# Patient Record
Sex: Female | Born: 1990 | Race: Black or African American | Hispanic: No | Smoking: Former smoker
Health system: Southern US, Community
[De-identification: ages and names within clinical notes are randomized; demographics above are authoritative.]

## PROBLEM LIST (undated history)

## (undated) ENCOUNTER — Inpatient Hospital Stay (HOSPITAL_COMMUNITY): Payer: Self-pay

## (undated) DIAGNOSIS — B999 Unspecified infectious disease: Secondary | ICD-10-CM

## (undated) DIAGNOSIS — N200 Calculus of kidney: Secondary | ICD-10-CM

## (undated) DIAGNOSIS — Z86711 Personal history of pulmonary embolism: Secondary | ICD-10-CM

## (undated) DIAGNOSIS — D649 Anemia, unspecified: Secondary | ICD-10-CM

## (undated) DIAGNOSIS — N159 Renal tubulo-interstitial disease, unspecified: Secondary | ICD-10-CM

## (undated) DIAGNOSIS — E282 Polycystic ovarian syndrome: Secondary | ICD-10-CM

## (undated) DIAGNOSIS — D573 Sickle-cell trait: Secondary | ICD-10-CM

## (undated) DIAGNOSIS — Z8759 Personal history of other complications of pregnancy, childbirth and the puerperium: Secondary | ICD-10-CM

## (undated) DIAGNOSIS — F419 Anxiety disorder, unspecified: Secondary | ICD-10-CM

## (undated) DIAGNOSIS — Z8739 Personal history of other diseases of the musculoskeletal system and connective tissue: Secondary | ICD-10-CM

## (undated) DIAGNOSIS — F603 Borderline personality disorder: Secondary | ICD-10-CM

## (undated) DIAGNOSIS — K219 Gastro-esophageal reflux disease without esophagitis: Secondary | ICD-10-CM

## (undated) DIAGNOSIS — K429 Umbilical hernia without obstruction or gangrene: Secondary | ICD-10-CM

## (undated) DIAGNOSIS — R87629 Unspecified abnormal cytological findings in specimens from vagina: Secondary | ICD-10-CM

## (undated) DIAGNOSIS — IMO0002 Reserved for concepts with insufficient information to code with codable children: Secondary | ICD-10-CM

## (undated) DIAGNOSIS — O26839 Pregnancy related renal disease, unspecified trimester: Secondary | ICD-10-CM

## (undated) DIAGNOSIS — G43909 Migraine, unspecified, not intractable, without status migrainosus: Secondary | ICD-10-CM

## (undated) HISTORY — PX: WISDOM TOOTH EXTRACTION: SHX21

## (undated) HISTORY — DX: Borderline personality disorder: F60.3

## (undated) HISTORY — DX: Personal history of other diseases of the musculoskeletal system and connective tissue: Z87.39

## (undated) HISTORY — DX: Unspecified infectious disease: B99.9

## (undated) HISTORY — DX: Personal history of other complications of pregnancy, childbirth and the puerperium: Z87.59

## (undated) HISTORY — DX: Anemia, unspecified: D64.9

## (undated) HISTORY — DX: Migraine, unspecified, not intractable, without status migrainosus: G43.909

## (undated) HISTORY — DX: Anxiety disorder, unspecified: F41.9

## (undated) HISTORY — DX: Reserved for concepts with insufficient information to code with codable children: IMO0002

## (undated) HISTORY — DX: Umbilical hernia without obstruction or gangrene: K42.9

## (undated) HISTORY — DX: Renal tubulo-interstitial disease, unspecified: N15.9

## (undated) HISTORY — DX: Personal history of pulmonary embolism: Z86.711

---
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
## 2009-03-07 ENCOUNTER — Emergency Department (HOSPITAL_COMMUNITY): Admission: EM | Admit: 2009-03-07 | Discharge: 2009-03-07 | Payer: Self-pay | Admitting: Emergency Medicine

## 2010-06-13 LAB — POCT URINALYSIS DIP (DEVICE)
Ketones, ur: 40 mg/dL — AB
Nitrite: POSITIVE — AB
Protein, ur: >=300 mg/dL — AB

## 2010-06-13 LAB — POCT PREGNANCY, URINE: Preg Test, Ur: NEGATIVE

## 2011-09-25 ENCOUNTER — Encounter (HOSPITAL_COMMUNITY): Payer: Self-pay

## 2011-09-25 ENCOUNTER — Emergency Department (INDEPENDENT_AMBULATORY_CARE_PROVIDER_SITE_OTHER)
Admission: EM | Admit: 2011-09-25 | Discharge: 2011-09-25 | Disposition: A | Discharge status: Home / Self Care | Payer: BC Managed Care – PPO | Source: Home / Self Care | Attending: Emergency Medicine | Admitting: Emergency Medicine

## 2011-09-25 DIAGNOSIS — J039 Acute tonsillitis, unspecified: Secondary | ICD-10-CM

## 2011-09-25 LAB — POCT RAPID STREP A: Streptococcus, Group A Screen (Direct): NEGATIVE

## 2011-09-25 MED ORDER — PENICILLIN G BENZATHINE 1200000 UNIT/2ML IM SUSP
INTRAMUSCULAR | Status: AC
  Filled 2011-09-25: qty 2

## 2011-09-25 MED ORDER — LIDOCAINE VISCOUS 2 % MT SOLN: 15.0000 mL | OROMUCOSAL | Status: AC | PRN

## 2011-09-25 MED ORDER — PENICILLIN G BENZATHINE 1200000 UNIT/2ML IM SUSP
1.2000 10*6.[IU] | Freq: Once | INTRAMUSCULAR | Status: AC
  Administered 2011-09-25: 1.2 10*6.[IU] via INTRAMUSCULAR

## 2011-09-25 NOTE — ED Provider Notes (Signed)
History     CSN: [id number]  Arrival date & time 09/25/11  1110   First MD Initiated Contact with Patient 09/25/11 1214      Chief Complaint  Patient presents with  . Sore Throat  . Otalgia    (Consider location/radiation/quality/duration/timing/severity/associated sxs/prior treatment) HPI Comments: Pain is only on R side  Patient is a 21 y.o. female presenting with pharyngitis and ear pain. The history is provided by the patient.  Sore Throat This is a new problem. The current episode started 2 days ago. The problem occurs constantly. The problem has been gradually worsening. Pertinent negatives include no abdominal pain. The symptoms are aggravated by swallowing. Nothing relieves the symptoms. Treatments tried: chloraseptic. The treatment provided no relief.  Otalgia Associated symptoms include sore throat. Pertinent negatives include no rhinorrhea, no abdominal pain, no cough and no rash.    History reviewed. No pertinent past medical history.  History reviewed. No pertinent past surgical history.  History reviewed. No pertinent family history.  History  Substance Use Topics  . Smoking status: Current Everyday Smoker  . Smokeless tobacco: Not on file  . Alcohol Use: Yes    OB History    Grav Para Term Preterm Abortions TAB SAB Ect Mult Living                  Review of Systems  Constitutional: Positive for chills. Negative for fever.  HENT: Positive for ear pain and sore throat. Negative for congestion, rhinorrhea, postnasal drip and sinus pressure.   Respiratory: Negative for cough.   Gastrointestinal: Negative for abdominal pain.  Skin: Negative for rash.    Allergies  Review of patient's allergies indicates no known allergies.  Home Medications   Current Outpatient Rx  Name Route Sig Dispense Refill  . LIDOCAINE VISCOUS 2 % MT SOLN Oral Take 15 mLs by mouth every 3 (three) hours as needed for pain. 100 mL 0    BP 133/76  Pulse 102  Temp 99 F  (37.2 C) (Oral)  Resp 18  SpO2 100%  LMP 09/02/2011  Physical Exam  Constitutional: She appears well-developed and well-nourished.       uncomfortable appearing  HENT:  Right Ear: Tympanic membrane, external ear and ear canal normal.  Left Ear: Tympanic membrane, external ear and ear canal normal.  Mouth/Throat: No tonsillar abscesses.       L tonsil/pharynx normal, R tonsil enlarged, erythematous, exudate.    Cardiovascular: Normal rate and regular rhythm.   Pulmonary/Chest: Effort normal and breath sounds normal.  Lymphadenopathy:       Head (right side): Submandibular and tonsillar adenopathy present. No submental, no preauricular and no posterior auricular adenopathy present.       Head (left side): No submandibular, no tonsillar, no preauricular and no posterior auricular adenopathy present.    She has no cervical adenopathy.    ED Course  Procedures (including critical care time)   Labs Reviewed  POCT RAPID STREP A ([facility] ONLY)  POCT INFECTIOUS MONO SCREEN   No results found.   1. Tonsillitis with exudate       MDM  Discussed with Dr. Lorenz Coaster. Strep and mono negative.         Cathlyn Parsons, NP 09/25/11 1324

## 2011-09-25 NOTE — ED Notes (Signed)
C/o sore throat since Sunday and rt ear pain since Monday.  Has also had chills.

## 2011-09-25 NOTE — ED Provider Notes (Signed)
Medical screening examination/treatment/procedure(s) were performed by non-physician practitioner and as supervising physician I was immediately available for consultation/collaboration.  Kupono Marling, M.D.   Daxter Paule C Ellyanna Holton, MD 09/25/11 2050 

## 2011-09-28 ENCOUNTER — Encounter (HOSPITAL_COMMUNITY): Payer: Self-pay

## 2011-09-28 ENCOUNTER — Emergency Department (INDEPENDENT_AMBULATORY_CARE_PROVIDER_SITE_OTHER)
Admission: EM | Admit: 2011-09-28 | Discharge: 2011-09-28 | Disposition: A | Discharge status: Home / Self Care | Payer: BC Managed Care – PPO | Source: Home / Self Care | Attending: Emergency Medicine | Admitting: Emergency Medicine

## 2011-09-28 ENCOUNTER — Telehealth (HOSPITAL_COMMUNITY): Payer: Self-pay | Admitting: *Deleted

## 2011-09-28 DIAGNOSIS — J020 Streptococcal pharyngitis: Secondary | ICD-10-CM

## 2011-09-28 MED ORDER — CLINDAMYCIN HCL 300 MG PO CAPS: 300.0000 mg | ORAL_CAPSULE | Freq: Four times a day (QID) | ORAL | Status: AC

## 2011-09-28 MED ORDER — METHYLPREDNISOLONE ACETATE 80 MG/ML IJ SUSP
INTRAMUSCULAR | Status: AC
  Filled 2011-09-28: qty 1

## 2011-09-28 MED ORDER — HYDROCODONE-ACETAMINOPHEN 5-325 MG PO TABS
1.0000 | ORAL_TABLET | Freq: Once | ORAL | Status: AC
  Administered 2011-09-28: 1 via ORAL

## 2011-09-28 MED ORDER — HYDROCODONE-ACETAMINOPHEN 5-325 MG PO TABS
ORAL_TABLET | ORAL | Status: AC
  Filled 2011-09-28: qty 1

## 2011-09-28 MED ORDER — SODIUM CHLORIDE 0.9 % IV SOLN
INTRAVENOUS | Status: DC
  Administered 2011-09-28 (×2): via INTRAVENOUS

## 2011-09-28 MED ORDER — METHYLPREDNISOLONE ACETATE 80 MG/ML IJ SUSP
80.0000 mg | Freq: Once | INTRAMUSCULAR | Status: AC
  Administered 2011-09-28: 80 mg via INTRAMUSCULAR

## 2011-09-28 MED ORDER — HYDROCODONE-ACETAMINOPHEN 5-325 MG PO TABS: ORAL_TABLET | ORAL | Status: AC

## 2011-09-28 NOTE — ED Notes (Signed)
Pt. called and said she was told she would be better by today.  States she is getting worse.  States she got a shot of PCN for tonsillitis and liquid Ibuprofen ( got viscous lidocaine).  States it numbs her for 15 min.  Can't drink.  C/o fever, prod. cough yellow sputum, spitting her saliva, and hurts worse 10/10.  Discussed with Rica Mast NP and she said pt. has to come back to be rechecked.  Pt. told this.  She said OK. Vassie Moselle 09/28/2011

## 2011-09-28 NOTE — ED Notes (Signed)
C/o feels no better, cannot swallow w/o pain; hurts less to spit than to swallow; throat reddened, foul smelling breath, tongue red, white patches, dry

## 2011-09-28 NOTE — ED Notes (Signed)
States she feels better, does not hurt as badly to swallow; was sleeping on initial entrance to room to eval

## 2011-09-28 NOTE — ED Provider Notes (Signed)
Chief Complaint  Patient presents with  . Sore Throat    History of Present Illness:   The patient is a 21 year old female who has had a six-day history of a severe right-sided sore throat. It hurts to swallow and she has to spit out her saliva. She's felt feverish and chilled, had a headache, slight cough, nausea. She was seen here 3 days ago. At that time a rapid strep was negative. I saw her along with the nurse practitioner. At that time her throat showed moderate swelling and purulent patches on the right tonsil. She was given LA Bicillin 1.2 million units, but today returns because she does not feel any better at all.  Review of Systems:  Other than as noted above, the patient denies any of the following symptoms. Systemic:  No fever, chills, sweats, fatigue, myalgias, headache, or anorexia. Eye:  No redness, pain or drainage. ENT:  No earache, ear congestion, nasal congestion, sneezing, rhinorrhea, sinus pressure, sinus pain, or post nasal drip. Lungs:  No cough, sputum production, wheezing, shortness of breath, or chest pain. GI:  No abdominal pain, nausea, vomiting, or diarrhea. Skin:  No rash or itching.  PMFSH:  Past medical history, family history, social history, meds, allergies, and nurse's notes were reviewed.  There is no known exposure to strep or mono.  No prior history of step or mono.  The patient denies use of tobacco.  Physical Exam:   Vital signs:  BP 83/49  Pulse 68  Temp 100.9 F (38.3 C) (Oral)  Resp 18  SpO2 100%  LMP 09/02/2011 General:  Alert, in moderate distress due to sore throat. She is spitting out her saliva. Eye:  No conjunctival injection or drainage. Lids were normal. ENT:  TMs and canals were normal, without erythema or inflammation.  Nasal mucosa was clear and uncongested, without drainage.  Mucous membranes were moist.  Exam of pharynx shows erythema and swelling of the right tonsil with exudate present. This looks identical to the appearance 3 days  ago when I saw her along with the nurse practitioner.  There were no oral ulcerations or lesions. Neck:  Supple, no adenopathy, tenderness or mass. Lungs:  No respiratory distress.  Lungs were clear to auscultation, without wheezes, rales or rhonchi.  Breath sounds were clear and equal bilaterally.  Heart:  Regular rhythm, without gallops, murmers or rubs. Skin:  Clear, warm, and dry, without rash or lesions.  Labs:   Results for orders placed during the hospital encounter of 09/28/11  POCT RAPID STREP A ([facility] ONLY)      Component Value Range   Streptococcus, Group A Screen (Direct) POSITIVE (*) NEGATIVE   Course in Urgent Care Center:   She was given 1 L of normal saline IV since she was somewhat hypotensive and has not been taking much in the way of by mouth intake. She also was given Depo-Medrol 80 mg IM for the inflammation and for pain and hydrocodone/APAP 5/325 one by mouth. She tolerated these all well and noted improvement in her symptoms after these treatments.  Assessment:  The encounter diagnosis was Strep throat. This is an unusual case. 2 days ago she had a negative rapid strep and was given LA Bicillin and is no better today. Today she has a positive rapid strep and her throat looks about the same. It looks like the LA Bicillin did not work, so he went to go to a second line drug, clindamycin and told her to return in  48 hours if no improvement.  Plan:   1.  The following meds were prescribed:   New Prescriptions   CLINDAMYCIN (CLEOCIN) 300 MG CAPSULE    Take 1 capsule (300 mg total) by mouth 4 (four) times daily.   HYDROCODONE-ACETAMINOPHEN (NORCO/VICODIN) 5-325 MG PER TABLET    1 to 2 tabs every 4 to 6 hours as needed for pain.   2.  The patient was instructed in symptomatic care including hot saline gargles, throat lozenges, infectious precautions, and need to trade out toothbrush. Handouts were given. 3.  The patient was told to return if becoming worse in any way, if  no better in 3 or 4 days, and given some red flag symptoms that would indicate earlier return.    Reuben Likes, MD 09/28/11 2123

## 2011-11-22 ENCOUNTER — Ambulatory Visit (INDEPENDENT_AMBULATORY_CARE_PROVIDER_SITE_OTHER): Payer: BC Managed Care – PPO | Admitting: Obstetrics and Gynecology

## 2011-11-22 DIAGNOSIS — Z331 Pregnant state, incidental: Secondary | ICD-10-CM

## 2011-11-22 LAB — POCT URINALYSIS DIPSTICK
Bilirubin, UA: NEGATIVE
Blood, UA: NEGATIVE
Glucose, UA: NEGATIVE
Ketones, UA: NEGATIVE
Protein, UA: NEGATIVE
Spec Grav, UA: 1.015
pH, UA: 6

## 2011-11-23 LAB — PRENATAL PANEL VII
Basophils Absolute: 0 10*3/uL (ref 0.0–0.1)
Basophils Relative: 0 % (ref 0–1)
HCT: 37 % (ref 36.0–46.0)
HIV: NONREACTIVE
Lymphocytes Relative: 20 % (ref 12–46)
Lymphs Abs: 1.5 10*3/uL (ref 0.7–4.0)
MCH: 28.3 pg (ref 26.0–34.0)
MCV: 82.6 fL (ref 78.0–100.0)
Monocytes Absolute: 0.6 10*3/uL (ref 0.1–1.0)
Monocytes Relative: 9 % (ref 3–12)
Neutrophils Relative %: 69 % (ref 43–77)
Rh Type: POSITIVE
Rubella: 159.6 IU/mL — ABNORMAL HIGH
WBC: 7.3 10*3/uL (ref 4.0–10.5)

## 2011-11-25 LAB — CULTURE, OB URINE

## 2011-11-26 LAB — HEMOGLOBINOPATHY EVALUATION
Hgb A: 56.1 % — ABNORMAL LOW (ref 96.8–97.8)
Hgb F Quant: 0 % (ref 0.0–2.0)
Hgb S Quant: 40.8 % — ABNORMAL HIGH

## 2011-11-29 ENCOUNTER — Ambulatory Visit (INDEPENDENT_AMBULATORY_CARE_PROVIDER_SITE_OTHER): Payer: BC Managed Care – PPO

## 2011-11-29 ENCOUNTER — Ambulatory Visit (INDEPENDENT_AMBULATORY_CARE_PROVIDER_SITE_OTHER): Payer: BC Managed Care – PPO | Admitting: Obstetrics and Gynecology

## 2011-11-29 ENCOUNTER — Encounter: Payer: Self-pay | Admitting: Obstetrics and Gynecology

## 2011-11-29 VITALS — BP 90/62 | Ht 67.5 in | Wt 124.0 lb

## 2011-11-29 DIAGNOSIS — K429 Umbilical hernia without obstruction or gangrene: Secondary | ICD-10-CM

## 2011-11-29 DIAGNOSIS — Z369 Encounter for antenatal screening, unspecified: Secondary | ICD-10-CM

## 2011-11-29 DIAGNOSIS — Z87440 Personal history of urinary (tract) infections: Secondary | ICD-10-CM

## 2011-11-29 DIAGNOSIS — R6889 Other general symptoms and signs: Secondary | ICD-10-CM

## 2011-11-29 DIAGNOSIS — Z331 Pregnant state, incidental: Secondary | ICD-10-CM

## 2011-11-29 DIAGNOSIS — IMO0002 Reserved for concepts with insufficient information to code with codable children: Secondary | ICD-10-CM

## 2011-11-29 DIAGNOSIS — Z36 Encounter for antenatal screening of mother: Secondary | ICD-10-CM

## 2011-11-29 DIAGNOSIS — Z124 Encounter for screening for malignant neoplasm of cervix: Secondary | ICD-10-CM

## 2011-11-29 DIAGNOSIS — Z87448 Personal history of other diseases of urinary system: Secondary | ICD-10-CM

## 2011-11-29 NOTE — Progress Notes (Signed)
Patient ID: Tasha Wu, female   DOB: September 25, 1990, 21 y.o.   MRN: [id number] Tasha Wu is a 21 y.o. female presenting for new ob visit. No using contraception at time of conception, certain of LMP will use for dating, [place] !st screen agrees with EDC. @MED  @IPILAPH @ OB History    Grav Para Term Preterm Abortions TAB SAB Ect Mult Living   2    1         HX EAB x 1 Past Medical History  Diagnosis Date  . Infection     Yeast;would get frequently  . Infection     BV x 1  . Kidney infection 2010    PO antibxs  . Migraines     no rx'd meds in the past;goes to sleep  . Umbilical hernia 1991    Does not cause anyp problems  . Emotional instability     Break down easily when things are not going her way  . Anxiety 2010    anxiety attack;no meds;has not been dx'd  . Hx of joint problems     Shoulders and knees have pain   Past Surgical History  Procedure Date  . Dilation and curettage of uterus 2009    No complications   Family History: family history includes Anemia in her mother; Arthritis in her paternal grandmother; Asthma in her sister; Diabetes in her maternal aunt and paternal grandfather; Diabetes type I in her cousin; Heart disease in her maternal aunt; Hernia in her maternal aunt; Hypertension in her maternal uncle and mother; Hyperthyroidism in her paternal grandmother; Migraines in her father; Other in her father; Scoliosis in her paternal grandmother; Sickle cell anemia in her maternal aunt and maternal grandmother; and Sickle cell trait in her brother and mother. Social History:  reports that she has been smoking Cigarettes.  She has been smoking about .2 packs per day. She does not have any smokeless tobacco history on file. She reports that she drinks about 3.5 ounces of alcohol per week. She reports that she does not use illicit drugs.  @ROS @    Blood pressure 90/62, height 5' 7.5" (1.715 m), weight 124 lb (56.246 kg), last menstrual period 09/02/2011. Physical  exam: Calm, no distress, HEENT wnl lungs clear bilaterally, breasts bilaterally no masses, lumps, dimpling or drainage, AP RRR, abd soft, gravid, nt, bowel sounds active, abdomen nontender, normal hair distrubition mons pubis,  EGBUS WNL, sterile speculum exam,  vagina pink, moist normal rugae,  cerix LTC, no cervical motion tenderness, No adnexal masses or tenderness Scant white discharge uterus firm 12weeks size DTR 2 no clonus No edema to lower extremities  Prenatal labs: ABO, Rh: O/POS/-- (09/19 1120) Antibody: NEG (09/19 1120) Rubella:  immune RPR: NON REAC (09/19 1120)  HBsAg: NEGATIVE (09/19 1120)  HIV: NON REACTIVE (09/19 1120)  GBS:   NA  Assessment/Plan: [redacted]w[redacted]d GC/CHL sent WET PREP +clue neg trich, neg hyphae with BV PAP sent ULTRASOUND 1st trimester screen  Dates agree Genetic testing f/o AFP next visit. Collaboration with Dr. Stefano Gaul. Tasha Wu 11/29/2011, 1:54 PM Lavera Guise, CNM

## 2011-11-29 NOTE — Progress Notes (Signed)
[redacted]w[redacted]d Pt wants genetic screening  Pap due today per pt  1st trimester scheduled today

## 2011-12-03 LAB — PAP IG, CT-NG, RFX HPV ASCU: Chlamydia Probe Amp: NEGATIVE

## 2011-12-03 LAB — US OB COMP LESS 14 WKS

## 2011-12-04 ENCOUNTER — Other Ambulatory Visit: Payer: Self-pay | Admitting: Obstetrics and Gynecology

## 2011-12-04 ENCOUNTER — Telehealth: Payer: Self-pay | Admitting: Obstetrics and Gynecology

## 2011-12-04 LAB — HUMAN PAPILLOMAVIRUS, HIGH RISK: HPV DNA High Risk: NOT DETECTED

## 2011-12-04 NOTE — Telephone Encounter (Signed)
TC to pt.  States Rx not at pharmacy.  Per [facility] called Rx for Flagyl, PNV, and Terazol as ordered in chart called to Dois Davenport at [facility]

## 2011-12-04 NOTE — Telephone Encounter (Signed)
VM from pt. Was prescribed  3 RX at last visit.  No available at pharmacy [facility]

## 2011-12-10 ENCOUNTER — Encounter: Payer: Self-pay | Admitting: Obstetrics and Gynecology

## 2011-12-10 DIAGNOSIS — IMO0002 Reserved for concepts with insufficient information to code with codable children: Secondary | ICD-10-CM

## 2011-12-10 DIAGNOSIS — R87619 Unspecified abnormal cytological findings in specimens from cervix uteri: Secondary | ICD-10-CM | POA: Insufficient documentation

## 2011-12-10 HISTORY — DX: Reserved for concepts with insufficient information to code with codable children: IMO0002

## 2011-12-10 HISTORY — DX: Unspecified abnormal cytological findings in specimens from cervix uteri: R87.619

## 2011-12-27 ENCOUNTER — Ambulatory Visit (INDEPENDENT_AMBULATORY_CARE_PROVIDER_SITE_OTHER): Payer: BC Managed Care – PPO | Admitting: Obstetrics and Gynecology

## 2011-12-27 VITALS — BP 90/60 | Wt 123.0 lb

## 2011-12-27 DIAGNOSIS — Z3490 Encounter for supervision of normal pregnancy, unspecified, unspecified trimester: Secondary | ICD-10-CM

## 2011-12-27 DIAGNOSIS — O480 Post-term pregnancy: Secondary | ICD-10-CM

## 2011-12-27 DIAGNOSIS — Z1379 Encounter for other screening for genetic and chromosomal anomalies: Secondary | ICD-10-CM

## 2011-12-27 DIAGNOSIS — Z3689 Encounter for other specified antenatal screening: Secondary | ICD-10-CM

## 2011-12-27 DIAGNOSIS — Z3201 Encounter for pregnancy test, result positive: Secondary | ICD-10-CM

## 2011-12-27 NOTE — Progress Notes (Signed)
[redacted]w[redacted]d No c/o  AFP today Anatomy US [place] F/u 4wks

## 2011-12-27 NOTE — Progress Notes (Signed)
[redacted]w[redacted]d No complaints today Pt declines Flu Vaccine

## 2011-12-27 NOTE — Patient Instructions (Signed)
High Protein Diet  A high protein diet means that high protein foods are added to your diet. Getting more protein in the diet is important for a number of reasons. Protein helps the body to build tissue, muscle, and to repair damage. People who have had surgery, injuries such as broken bones, infections, and burns, or illnesses such as cancer, may need more protein in their diet.   SERVING SIZES  Measuring foods and serving sizes helps to make sure you are getting the right amount of food. The list below tells how big or small some common serving sizes are.    1 oz.........4 stacked dice.   3 oz.........Deck of cards.   1 tsp........Tip of little finger.   1 tbs........Thumb.   2 tbs........Golf ball.    cup.......Half of a fist.   1 cup........A fist.  FOOD SOURCES OF PROTEIN  Listed below are some food sources of protein and the amount of protein they contain. Your Registered Dietitian can calculate how many grams of protein you need for your medical condition. High protein foods can be added to the diet at mealtime or as snacks. Be sure to have at least 1 protein-containing food at each meal and snack to ensure adequate intake.   Meats and Meat Substitutes / Protein (g)   3 oz poultry (chicken, turkey) / 26 g   3 oz tuna, canned in water / 26 g   3 oz fish (cod) / 21 g   3 oz red meat (beef, pork) / 21 g   4 oz tofu / 9 g   1 egg / 6 g    cup egg substitute / 5 g   1 cup dried beans / 15 g   1 cup soy milk / 4 g  Dairy / Protein (g)   1 cup milk (skim, 1%, 2%, whole) / 8 g    cup evaporated milk / 9 g   1 cup buttermilk / 8 g   1 cup low-fat plain yogurt / 11 g   1 cup regular plain yogurt / 9 g    cup cottage cheese / 14 g   1 oz cheddar cheese / 7 g  Nuts / Protein (g)   2 tbs peanut butter / 8 g   1 oz peanuts / 7 g   2 tbs cashews / 5 g   2 tbs almonds / 5 g  Document Released: 02/19/2005 Document Revised: 05/14/2011 Document Reviewed: 11/22/2006  [employer] Patient Information  2013 [employer]

## 2011-12-29 LAB — URINE CULTURE: Colony Count: 35000

## 2012-01-01 LAB — ALPHA FETOPROTEIN, MATERNAL
AFP: 74.2 IU/mL
Open Spina bifida: NEGATIVE

## 2012-01-24 ENCOUNTER — Ambulatory Visit (INDEPENDENT_AMBULATORY_CARE_PROVIDER_SITE_OTHER): Payer: BC Managed Care – PPO

## 2012-01-24 ENCOUNTER — Ambulatory Visit (INDEPENDENT_AMBULATORY_CARE_PROVIDER_SITE_OTHER): Payer: BC Managed Care – PPO | Admitting: Obstetrics and Gynecology

## 2012-01-24 VITALS — BP 84/42 | Wt 129.0 lb

## 2012-01-24 DIAGNOSIS — Z331 Pregnant state, incidental: Secondary | ICD-10-CM

## 2012-01-24 DIAGNOSIS — Z3689 Encounter for other specified antenatal screening: Secondary | ICD-10-CM

## 2012-01-24 NOTE — Progress Notes (Signed)
[redacted]w[redacted]d no complaints

## 2012-01-24 NOTE — Progress Notes (Signed)
[redacted]w[redacted]d F/u anatomy scan today. No c/o.

## 2012-01-24 NOTE — Progress Notes (Signed)
Ultrasound shows:  SIUP  S=D     [place] EDD: 06/10/12            EFW: 12 oz            AFI: na           Cervical length: 3.21 cm           Placenta localization: anterior           Fetal presentation: vertex                   Anatomy survey is normal           Gender : female Comments: no previa. Placenta edge to cx = 8 cm - normal placental cord insertion. Normal fluid:AP pocket = 4.8 cm. Measurements concordant with established GA/EDD. No fetal abnormality is seen. Open hands, 5 th digit seen. female gender. CX closed. Measured transvaginally. Normal adnexa

## 2012-01-28 LAB — US OB COMP + 14 WK

## 2012-02-21 ENCOUNTER — Ambulatory Visit (INDEPENDENT_AMBULATORY_CARE_PROVIDER_SITE_OTHER): Payer: BC Managed Care – PPO | Admitting: Obstetrics and Gynecology

## 2012-02-21 ENCOUNTER — Encounter: Payer: Self-pay | Admitting: Obstetrics and Gynecology

## 2012-02-21 VITALS — BP 100/62 | Wt 134.0 lb

## 2012-02-21 DIAGNOSIS — Z331 Pregnant state, incidental: Secondary | ICD-10-CM

## 2012-02-21 NOTE — Progress Notes (Signed)
[redacted]w[redacted]d Pt without c/o lucola @[place] 

## 2012-02-21 NOTE — Progress Notes (Signed)
Pt stated no issues today. Pt declined the flu shot today.  

## 2012-02-21 NOTE — Patient Instructions (Signed)
ABCs of Pregnancy  A  Antepartum care is very important. Be sure you see your doctor and get prenatal care as soon as you think you are pregnant. At this time, you will be tested for infection, genetic abnormalities and potential problems with you and the pregnancy. This is the time to discuss diet, exercise, work, medications, labor, pain medication during labor and the possibility of a cesarean delivery. Ask any questions that may concern you. It is important to see your doctor regularly throughout your pregnancy. Avoid exposure to toxic substances and chemicals - such as cleaning solvents, lead and mercury, some insecticides, and paint. Pregnant women should avoid exposure to paint fumes, and fumes that cause you to feel ill, dizzy or faint. When possible, it is a good idea to have a pre-pregnancy consultation with your caregiver to begin some important recommendations your caregiver suggests such as, taking folic acid, exercising, quitting smoking, avoiding alcoholic beverages, etc.  B  Breastfeeding is the healthiest choice for both you and your baby. It has many nutritional benefits for the baby and health benefits for the mother. It also creates a very tight and loving bond between the baby and mother. Talk to your doctor, your family and friends, and your employer about how you choose to feed your baby and how they can support you in your decision. Not all birth defects can be prevented, but a woman can take actions that may increase her chance of having a healthy baby. Many birth defects happen very early in pregnancy, sometimes before a woman even knows she is pregnant. Birth defects or abnormalities of any child in your or the father's family should be discussed with your caregiver. Get a good support bra as your breast size changes. Wear it especially when you exercise and when nursing.   C  Celebrate the news of your pregnancy with the your spouse/father and family. Childbirth classes are helpful to  take for you and the spouse/father because it helps to understand what happens during the pregnancy, labor and delivery. Cesarean delivery should be discussed with your doctor so you are prepared for that possibility. The pros and cons of circumcision if it is a boy, should be discussed with your pediatrician. Cigarette smoking during pregnancy can result in low birth weight babies. It has been associated with infertility, miscarriages, tubal pregnancies, infant death (mortality) and poor health (morbidity) in childhood. Additionally, cigarette smoking may cause long-term learning disabilities. If you smoke, you should try to quit before getting pregnant and not smoke during the pregnancy. Secondary smoke may also harm a mother and her developing baby. It is a good idea to ask people to stop smoking around you during your pregnancy and after the baby is born. Extra calcium is necessary when you are pregnant and is found in your prenatal vitamin, in dairy products, green leafy vegetables and in calcium supplements.  D  A healthy diet according to your current weight and height, along with vitamins and mineral supplements should be discussed with your caregiver. Domestic abuse or violence should be made known to your doctor right away to get the situation corrected. Drink more water when you exercise to keep hydrated. Discomfort of your back and legs usually develops and progresses from the middle of the second trimester through to delivery of the baby. This is because of the enlarging baby and uterus, which may also affect your balance. Do not take illegal drugs. Illegal drugs can seriously harm the baby and you. Drink extra   fluids (water is best) throughout pregnancy to help your body keep up with the increases in your blood volume. Drink at least 6 to 8 glasses of water, fruit juice, or milk each day. A good way to know you are drinking enough fluid is when your urine looks almost like clear water or is very light  yellow.   E  Eat healthy to get the nutrients you and your unborn baby need. Your meals should include the five basic food groups. Exercise (30 minutes of light to moderate exercise a day) is important and encouraged during pregnancy, if there are no medical problems or problems with the pregnancy. Exercise that causes discomfort or dizziness should be stopped and reported to your caregiver. Emotions during pregnancy can change from being ecstatic to depression and should be understood by you, your partner and your family.  F  Fetal screening with ultrasound, amniocentesis and monitoring during pregnancy and labor is common and sometimes necessary. Take 400 micrograms of folic acid daily both before, when possible, and during the first few months of pregnancy to reduce the risk of birth defects of the brain and spine. All women who could possibly become pregnant should take a vitamin with folic acid, every day. It is also important to eat a healthy diet with fortified foods (enriched grain products, including cereals, rice, breads, and pastas) and foods with natural sources of folate (orange juice, green leafy vegetables, beans, peanuts, broccoli, asparagus, peas, and lentils). The father should be involved with all aspects of the pregnancy including, the prenatal care, childbirth classes, labor, delivery, and postpartum time. Fathers may also have emotional concerns about being a father, financial needs, and raising a family.  G  Genetic testing should be done appropriately. It is important to know your family and the father's history. If there have been problems with pregnancies or birth defects in your family, report these to your doctor. Also, genetic counselors can talk with you about the information you might need in making decisions about having a family. You can call a major medical center in your area for help in finding a board-certified genetic counselor. Genetic testing and counseling should be done  before pregnancy when possible, especially if there is a history of problems in the mother's or father's family. Certain ethnic backgrounds are more at risk for genetic defects.  H  Get familiar with the hospital where you will be having your baby. Get to know how long it takes to get there, the labor and delivery area, and the hospital procedures. Be sure your medical insurance is accepted there. Get your home ready for the baby including, clothes, the baby's room (when possible), furniture and car seat. Hand washing is important throughout the day, especially after handling raw meat and poultry, changing the baby's diaper or using the bathroom. This can help prevent the spread of many bacteria and viruses that cause infection. Your hair may become dry and thinner, but will return to normal a few weeks after the baby is born. Heartburn is a common problem that can be treated by taking antacids recommended by your caregiver, eating smaller meals 5 or 6 times a day, not drinking liquids when eating, drinking between meals and raising the head of your bed 2 to 3 inches.  I  Insurance to cover you, the baby, doctor and hospital should be reviewed so that you will be prepared to pay any costs not covered by your insurance plan. If you do not have medical insurance,   there are usually clinics and services available for you in your community. Take 30 milligrams of iron during your pregnancy as prescribed by your doctor to reduce the risk of low red blood cells (anemia) later in pregnancy. All women of childbearing age should eat a diet rich in iron.  J  There should be a joint effort for the mother, father and any other children to adapt to the pregnancy financially, emotionally, and psychologically during the pregnancy. Join a support group for moms-to-be. Or, join a class on parenting or childbirth. Have the family participate when possible.  K  Know your limits. Let your caregiver know if you experience any of the  following:   · Pain of any kind.  · Strong cramps.  · You develop a lot of weight in a short period of time (5 pounds in 3 to 5 days).  · Vaginal bleeding, leaking of amniotic fluid.  · Headache, vision problems.  · Dizziness, fainting, shortness of breath.  · Chest pain.  · Fever of 102° F (38.9° C) or higher.  · Gush of clear fluid from your vagina.  · Painful urination.  · Domestic violence.  · Irregular heartbeat (palpitations).  · Rapid beating of the heart (tachycardia).  · Constant feeling sick to your stomach (nauseous) and vomiting.  · Trouble walking, fluid retention (edema).  · Muscle weakness.  · If your baby has decreased activity.  · Persistent diarrhea.  · Abnormal vaginal discharge.  · Uterine contractions at 20-minute intervals.  · Back pain that travels down your leg.  L  Learn and practice that what you eat and drink should be in moderation and healthy for you and your baby. Legal drugs such as alcohol and caffeine are important issues for pregnant women. There is no safe amount of alcohol a woman can drink while pregnant. Fetal alcohol syndrome, a disorder characterized by growth retardation, facial abnormalities, and central nervous system dysfunction, is caused by a woman's use of alcohol during pregnancy. Caffeine, found in tea, coffee, soft drinks and chocolate, should also be limited. Be sure to read labels when trying to cut down on caffeine during pregnancy. More than 200 foods, beverages, and over-the-counter medications contain caffeine and have a high salt content! There are coffees and teas that do not contain caffeine.  M  Medical conditions such as diabetes, epilepsy, and high blood pressure should be treated and kept under control before pregnancy when possible, but especially during pregnancy. Ask your caregiver about any medications that may need to be changed or adjusted during pregnancy. If you are currently taking any medications, ask your caregiver if it is safe to take them  while you are pregnant or before getting pregnant when possible. Also, be sure to discuss any herbs or vitamins you are taking. They are medicines, too! Discuss with your doctor all medications, prescribed and over-the-counter, that you are taking. During your prenatal visit, discuss the medications your doctor may give you during labor and delivery.  N  Never be afraid to ask your doctor or caregiver questions about your health, the progress of the pregnancy, family problems, stressful situations, and recommendation for a pediatrician, if you do not have one. It is better to take all precautions and discuss any questions or concerns you may have during your office visits. It is a good idea to write down your questions before you visit the doctor.  O  Over-the-counter cough and cold remedies may contain alcohol or other ingredients that should   be avoided during pregnancy. Ask your caregiver about prescription, herbs or over-the-counter medications that you are taking or may consider taking while pregnant.   P  Physical activity during pregnancy can benefit both you and your baby by lessening discomfort and fatigue, providing a sense of well-being, and increasing the likelihood of early recovery after delivery. Light to moderate exercise during pregnancy strengthens the belly (abdominal) and back muscles. This helps improve posture. Practicing yoga, walking, swimming, and cycling on a stationary bicycle are usually safe exercises for pregnant women. Avoid scuba diving, exercise at high altitudes (over 3000 feet), skiing, horseback riding, contact sports, etc. Always check with your doctor before beginning any kind of exercise, especially during pregnancy and especially if you did not exercise before getting pregnant.  Q  Queasiness, stomach upset and morning sickness are common during pregnancy. Eating a couple of crackers or dry toast before getting out of bed. Foods that you normally love may make you feel sick to  your stomach. You may need to substitute other nutritious foods. Eating 5 or 6 small meals a day instead of 3 large ones may make you feel better. Do not drink with your meals, drink between meals. Questions that you have should be written down and asked during your prenatal visits.  R  Read about and make plans to baby-proof your home. There are important tips for making your home a safer environment for your baby. Review the tips and make your home safer for you and your baby. Read food labels regarding calories, salt and fat content in the food.  S  Saunas, hot tubs, and steam rooms should be avoided while you are pregnant. Excessive high heat may be harmful during your pregnancy. Your caregiver will screen and examine you for sexually transmitted diseases and genetic disorders during your prenatal visits. Learn the signs of labor. Sexual relations while pregnant is safe unless there is a medical or pregnancy problem and your caregiver advises against it.  T  Traveling long distances should be avoided especially in the third trimester of your pregnancy. If you do have to travel out of state, be sure to take a copy of your medical records and medical insurance plan with you. You should not travel long distances without seeing your doctor first. Most airlines will not allow you to travel after 36 weeks of pregnancy. Toxoplasmosis is an infection caused by a parasite that can seriously harm an unborn baby. Avoid eating undercooked meat and handling cat litter. Be sure to wear gloves when gardening. Tingling of the hands and fingers is not unusual and is due to fluid retention. This will go away after the baby is born.  U  Womb (uterus) size increases during the first trimester. Your kidneys will begin to function more efficiently. This may cause you to feel the need to urinate more often. You may also leak urine when sneezing, coughing or laughing. This is due to the growing uterus pressing against your bladder,  which lies directly in front of and slightly under the uterus during the first few months of pregnancy. If you experience burning along with frequency of urination or bloody urine, be sure to tell your doctor. The size of your uterus in the third trimester may cause a problem with your balance. It is advisable to maintain good posture and avoid wearing high heels during this time. An ultrasound of your baby may be necessary during your pregnancy and is safe for you and your baby.  V    Vaccinations are an important concern for pregnant women. Get needed vaccines before pregnancy. Center for Disease Control (www.cdc.gov) has clear guidelines for the use of vaccines during pregnancy. Review the list, be sure to discuss it with your doctor. Prenatal vitamins are helpful and healthy for you and the baby. Do not take extra vitamins except what is recommended. Taking too much of certain vitamins can cause overdose problems. Continuous vomiting should be reported to your caregiver. Varicose veins may appear especially if there is a family history of varicose veins. They should subside after the delivery of the baby. Support hose helps if there is leg discomfort.  W  Being overweight or underweight during pregnancy may cause problems. Try to get within 15 pounds of your ideal weight before pregnancy. Remember, pregnancy is not a time to be dieting! Do not stop eating or start skipping meals as your weight increases. Both you and your baby need the calories and nutrition you receive from a healthy diet. Be sure to consult with your doctor about your diet. There is a formula and diet plan available depending on whether you are overweight or underweight. Your caregiver or nutritionist can help and advise you if necessary.  X  Avoid X-rays. If you must have dental work or diagnostic tests, tell your dentist or physician that you are pregnant so that extra care can be taken. X-rays should only be taken when the risks of not taking  them outweigh the risk of taking them. If needed, only the minimum amount of radiation should be used. When X-rays are necessary, protective lead shields should be used to cover areas of the body that are not being X-rayed.  Y  Your baby loves you. Breastfeeding your baby creates a loving and very close bond between the two of you. Give your baby a healthy environment to live in while you are pregnant. Infants and children require constant care and guidance. Their health and safety should be carefully watched at all times. After the baby is born, rest or take a nap when the baby is sleeping.  Z  Get your ZZZs. Be sure to get plenty of rest. Resting on your side as often as possible, especially on your left side is advised. It provides the best circulation to your baby and helps reduce swelling. Try taking a nap for 30 to 45 minutes in the afternoon when possible. After the baby is born rest or take a nap when the baby is sleeping. Try elevating your feet for that amount of time when possible. It helps the circulation in your legs and helps reduce swelling.   Most information courtesy of the CDC.  Document Released: 02/19/2005 Document Revised: 05/14/2011 Document Reviewed: 11/03/2008  [employer]® Patient Information ©2013 [employer].

## 2012-03-05 NOTE — L&D Delivery Note (Signed)
Delivery Note At 10:24 AM a viable female was delivered via Vaginal, Spontaneous Delivery (Presentation: Left Occiput Anterior).  APGAR: 9, 9; weight pending. Cord around ankle.   Placenta status: Intact, Spontaneous, Shultz, 3 VC. Complications: None.  Cord pH: not collected  Anesthesia: Epidural  Episiotomy: None Lacerations: 1st degree  Suture Repair: 4.0  Est. Blood Loss (mL):   Mom to postpartum.  Baby to skin to skin immediately after. Breastfeeding started.Beckey Downing, Abbas Beyene 06/18/2012, 10:59 AM

## 2012-03-20 ENCOUNTER — Other Ambulatory Visit: Payer: BC Managed Care – PPO

## 2012-03-20 ENCOUNTER — Encounter: Payer: BC Managed Care – PPO | Admitting: Obstetrics and Gynecology

## 2012-03-20 ENCOUNTER — Encounter: Payer: Self-pay | Admitting: Obstetrics and Gynecology

## 2012-03-31 ENCOUNTER — Ambulatory Visit: Payer: Medicaid Other | Admitting: Obstetrics and Gynecology

## 2012-03-31 ENCOUNTER — Other Ambulatory Visit: Payer: Medicaid Other

## 2012-03-31 VITALS — BP 98/52 | Wt 137.0 lb

## 2012-03-31 DIAGNOSIS — Z331 Pregnant state, incidental: Secondary | ICD-10-CM

## 2012-03-31 LAB — CBC
HCT: 30 % — ABNORMAL LOW (ref 36.0–46.0)
Hemoglobin: 10.3 g/dL — ABNORMAL LOW (ref 12.0–15.0)
RBC: 3.64 MIL/uL — ABNORMAL LOW (ref 3.87–5.11)
RDW: 13.3 % (ref 11.5–15.5)
WBC: 7 10*3/uL (ref 4.0–10.5)

## 2012-03-31 NOTE — Progress Notes (Signed)
Pt stated no issues today. GTT given @ 8:49       Draw @ 9:49

## 2012-03-31 NOTE — Progress Notes (Signed)
[redacted]w[redacted]d wgt gain 9#  Doing well rec CBE and BF  1hr gtt today RTO 2wks

## 2012-04-01 LAB — GLUCOSE TOLERANCE, 1 HOUR (50G) W/O FASTING: Glucose, 1 Hour GTT: 110 mg/dL (ref 70–140)

## 2012-04-17 ENCOUNTER — Encounter: Payer: Medicaid Other | Admitting: Obstetrics and Gynecology

## 2012-04-21 ENCOUNTER — Ambulatory Visit: Payer: Medicaid Other | Admitting: Obstetrics and Gynecology

## 2012-04-21 VITALS — BP 100/56 | Wt 142.0 lb

## 2012-04-21 DIAGNOSIS — K429 Umbilical hernia without obstruction or gangrene: Secondary | ICD-10-CM

## 2012-04-21 DIAGNOSIS — D649 Anemia, unspecified: Secondary | ICD-10-CM

## 2012-04-21 DIAGNOSIS — Z3490 Encounter for supervision of normal pregnancy, unspecified, unspecified trimester: Secondary | ICD-10-CM

## 2012-04-21 MED ORDER — FUSION PLUS PO CAPS: 1.0000 | ORAL_CAPSULE | Freq: Two times a day (BID) | ORAL | Status: DC

## 2012-04-21 NOTE — Progress Notes (Signed)
[redacted]w[redacted]d Pt c/o,"throbbing in uterus lasting approx 5 min daily x 1 week". U/S nv  FOR SIZE<DATES 1 hr glu no Hgb=10.3  Fusion plus BID ordered

## 2012-04-22 ENCOUNTER — Telehealth: Payer: Self-pay | Admitting: Obstetrics and Gynecology

## 2012-04-23 NOTE — Telephone Encounter (Signed)
Spoke with pt rgd msg pt states rx for iron supp to expensive advised pt can take otc iron supplement instead if cheaper also informed pt we can give her some samples at next visit pt voice understanding

## 2012-04-25 ENCOUNTER — Other Ambulatory Visit: Payer: Self-pay

## 2012-04-25 DIAGNOSIS — O26849 Uterine size-date discrepancy, unspecified trimester: Secondary | ICD-10-CM

## 2012-04-28 ENCOUNTER — Ambulatory Visit: Payer: Medicaid Other

## 2012-04-28 ENCOUNTER — Encounter: Payer: Self-pay | Admitting: Family Medicine

## 2012-04-28 ENCOUNTER — Ambulatory Visit: Payer: Medicaid Other | Admitting: Family Medicine

## 2012-04-28 VITALS — BP 86/52 | Wt 143.0 lb

## 2012-04-28 DIAGNOSIS — O26849 Uterine size-date discrepancy, unspecified trimester: Secondary | ICD-10-CM

## 2012-04-28 DIAGNOSIS — Z3490 Encounter for supervision of normal pregnancy, unspecified, unspecified trimester: Secondary | ICD-10-CM

## 2012-04-28 LAB — US OB FOLLOW UP

## 2012-04-28 NOTE — Progress Notes (Signed)
[redacted]w[redacted]d No complaints today. Ultrasound shows:  SIUP  S=D     [place] EDD: 06/16/2012           AFI: 11.1           Cervical length: 3.10 cm           Placenta localization: anterior           Fetal presentation: Vertex                   Anatomy survey is normal           Gender : female

## 2012-04-28 NOTE — Progress Notes (Signed)
[redacted]w[redacted]d No complaints today.   Discussed U/S and pt questions about growth answered.  GBS done today. ROB 2 weeks. L.Lissete Maestas, FNP-BC

## 2012-04-30 LAB — STREP B DNA PROBE: GBSP: NEGATIVE

## 2012-05-12 ENCOUNTER — Ambulatory Visit: Payer: Medicaid Other | Admitting: Certified Nurse Midwife

## 2012-05-12 VITALS — BP 94/68 | Wt 145.0 lb

## 2012-05-12 DIAGNOSIS — Z331 Pregnant state, incidental: Secondary | ICD-10-CM

## 2012-05-12 NOTE — Patient Instructions (Signed)
Braxton Hicks Contractions Pregnancy is commonly associated with contractions of the uterus throughout the pregnancy. Towards the end of pregnancy (32 to 34 weeks), these contractions [facility] W[facility] Rough) can develop more often and may become more forceful. This is not true labor because these contractions do not result in opening (dilatation) and thinning of the cervix. They are sometimes difficult to tell apart from true labor because these contractions can be forceful and people have different pain tolerances. You should not feel embarrassed if you go to the hospital with false labor. Sometimes, the only way to tell if you are in true labor is for your caregiver to follow the changes in the cervix. How to tell the difference between true and false labor:  False labor.  The contractions of false labor are usually shorter, irregular and not as hard as those of true labor.  They are often felt in the front of the lower abdomen and in the groin.  They may leave with walking around or changing positions while lying down.  They get weaker and are shorter lasting as time goes on.  These contractions are usually irregular.  They do not usually become progressively stronger, regular and closer together as with true labor.  True labor.  Contractions in true labor last 30 to 70 seconds, become very regular, usually become more intense, and increase in frequency.  They do not go away with walking.  The discomfort is usually felt in the top of the uterus and spreads to the lower abdomen and low back.  True labor can be determined by your caregiver with an exam. This will show that the cervix is dilating and getting thinner. If there are no prenatal problems or other health problems associated with the pregnancy, it is completely safe to be sent home with false labor and await the onset of true labor. HOME CARE INSTRUCTIONS   Keep up with your usual exercises and instructions.  Take medications as  directed.  Keep your regular prenatal appointment.  Eat and drink lightly if you think you are going into labor.  If BH contractions are making you uncomfortable:  Change your activity position from lying down or resting to walking/walking to resting.  Sit and rest in a tub of warm water.  Drink 2 to 3 glasses of water. Dehydration may cause B-H contractions.  Do slow and deep breathing several times an hour. SEEK IMMEDIATE MEDICAL CARE IF:   Your contractions continue to become stronger, more regular, and closer together.  You have a gushing, burst or leaking of fluid from the vagina.  An oral temperature above 102 F (38.9 C) develops.  You have passage of blood-tinged mucus.  You develop vaginal bleeding.  You develop continuous belly (abdominal) pain.  You have low back pain that you never had before.  You feel the baby's head pushing down causing pelvic pressure.  The baby is not moving as much as it used to. Document Released: 02/19/2005 Document Revised: 05/14/2011 Document Reviewed: 08/13/2008 [facility] Patient Information 2013 [place], [place].      Normal Labor and Delivery Your caregiver must first be sure you are in labor. Signs of labor include:  You may pass what is called "the mucus plug" before labor begins. This is a small amount of blood stained mucus.  Regular uterine contractions.  The time between contractions get closer together.  The discomfort and pain gradually gets more intense.  Pains are mostly located in the back.  Pains get worse when walking.  The cervix (the opening of the uterus becomes thinner (begins to efface) and opens up (dilates). Once you are in labor and admitted into the hospital or care center, your caregiver will do the following:  A complete physical examination.  Check your vital signs (blood pressure, pulse, temperature and the fetal heart rate).  Do a vaginal examination (using a sterile glove and lubricant)  to determine:  The position (presentation) of the baby (head [vertex] or buttock first).  The level (station) of the baby's head in the birth canal.  The effacement and dilatation of the cervix.  You may have your pubic hair shaved and be given an enema depending on your caregiver and the circumstance.  An electronic monitor is usually placed on your abdomen. The monitor follows the length and intensity of the contractions, as well as the baby's heart rate.  Usually, your caregiver will insert an IV in your arm with a bottle of sugar water. This is done as a precaution so that medications can be given to you quickly during labor or delivery. NORMAL LABOR AND DELIVERY IS DIVIDED UP INTO 3 STAGES: First Stage This is when regular contractions begin and the cervix begins to efface and dilate. This stage can last from 3 to 15 hours. The end of the first stage is when the cervix is 100% effaced and 10 centimeters dilated. Pain medications may be given by   Injection (morphine, demerol, etc.)  Regional anesthesia (spinal, caudal or epidural, anesthetics given in different locations of the spine). Paracervical pain medication may be given, which is an injection of and anesthetic on each side of the cervix. A pregnant woman may request to have "Natural Childbirth" which is not to have any medications or anesthesia during her labor and delivery. Second Stage This is when the baby comes down through the birth canal (vagina) and is born. This can take 1 to 4 hours. As the baby's head comes down through the birth canal, you may feel like you are going to have a bowel movement. You will get the urge to bear down and push until the baby is delivered. As the baby's head is being delivered, the caregiver will decide if an episiotomy (a cut in the perineum and vagina area) is needed to prevent tearing of the tissue in this area. The episiotomy is sewn up after the delivery of the baby and placenta. Sometimes a  mask with nitrous oxide is given for the mother to breath during the delivery of the baby to help if there is too much pain. The end of Stage 2 is when the baby is fully delivered. Then when the umbilical cord stops pulsating it is clamped and cut. Third Stage The third stage begins after the baby is completely delivered and ends after the placenta (afterbirth) is delivered. This usually takes 5 to 30 minutes. After the placenta is delivered, a medication is given either by intravenous or injection to help contract the uterus and prevent bleeding. The third stage is not painful and pain medication is usually not necessary. If an episiotomy was done, it is repaired at this time. After the delivery, the mother is watched and monitored closely for 1 to 2 hours to make sure there is no postpartum bleeding (hemorrhage). If there is a lot of bleeding, medication is given to contract the uterus and stop the bleeding. Document Released: 11/29/2007 Document Revised: 05/14/2011 Document Reviewed: 11/29/2007 [facility] Patient Information 2013 [place], [place].

## 2012-05-12 NOTE — Progress Notes (Signed)
No complaints

## 2012-05-12 NOTE — Progress Notes (Signed)
[redacted]w[redacted]d Pt has been having occasional BH - she asked for information on knowing the difference in [facility] and UCs.  Rv'd s/s of labor and pt instructions given. GBS swab was done at 34 weeks, if she makes it to 40 GBS will need to be repeated. GC/CT swab was not done with GBS.  Could not void again this visit, so she knows we need to get this at [facility].

## 2012-05-19 ENCOUNTER — Ambulatory Visit: Payer: Medicaid Other

## 2012-05-19 VITALS — BP 90/58 | Wt 151.0 lb

## 2012-05-19 DIAGNOSIS — Z331 Pregnant state, incidental: Secondary | ICD-10-CM

## 2012-05-19 DIAGNOSIS — O329XX0 Maternal care for malpresentation of fetus, unspecified, not applicable or unspecified: Secondary | ICD-10-CM

## 2012-05-19 LAB — US OB LIMITED

## 2012-05-19 NOTE — Progress Notes (Signed)
[redacted]w[redacted]d No complaints per pt

## 2012-05-19 NOTE — Progress Notes (Signed)
Additional note 05/19/12 Presentation Ultrasound shows:             AFI: 15.0 cm = 55%tile           Cervical length: not measured           Placenta localization: anterior           Fetal presentation: vertex           Single fetus           Normal fluid

## 2012-05-19 NOTE — Progress Notes (Signed)
[redacted]w[redacted]d; unsure presentation per Leopold's, so u/s for presentation obtained & Vtx! AFI=15 (55%).  Pt declined cervical exam.  Rev'd [facility]KC and labor precautions. Sent urine specimen for gc/ct cx b/c not done at GBS.

## 2012-06-16 ENCOUNTER — Telehealth: Payer: Self-pay | Admitting: Obstetrics and Gynecology

## 2012-06-16 NOTE — Telephone Encounter (Signed)
Induction Scheduled for 06/18/12 @ 7:30pm with [facility]. -[name]

## 2012-06-17 ENCOUNTER — Encounter (HOSPITAL_COMMUNITY): Payer: Self-pay | Admitting: *Deleted

## 2012-06-17 ENCOUNTER — Telehealth (HOSPITAL_COMMUNITY): Payer: Self-pay | Admitting: *Deleted

## 2012-06-17 NOTE — Telephone Encounter (Signed)
Preadmission screen  

## 2012-06-18 ENCOUNTER — Encounter (HOSPITAL_COMMUNITY): Payer: Self-pay | Admitting: Anesthesiology

## 2012-06-18 ENCOUNTER — Inpatient Hospital Stay (HOSPITAL_COMMUNITY): Payer: Medicaid Other | Admitting: Anesthesiology

## 2012-06-18 ENCOUNTER — Inpatient Hospital Stay (HOSPITAL_COMMUNITY)
Admission: AD | Admit: 2012-06-18 | Discharge: 2012-06-20 | DRG: 775 | Disposition: A | Discharge status: Home / Self Care | Payer: Medicaid Other | Source: Ambulatory Visit | Attending: Obstetrics and Gynecology | Admitting: Obstetrics and Gynecology

## 2012-06-18 ENCOUNTER — Inpatient Hospital Stay (HOSPITAL_COMMUNITY): Admission: RE | Admit: 2012-06-18 | Payer: BC Managed Care – PPO | Source: Ambulatory Visit

## 2012-06-18 ENCOUNTER — Encounter (HOSPITAL_COMMUNITY): Payer: Self-pay

## 2012-06-18 DIAGNOSIS — D649 Anemia, unspecified: Secondary | ICD-10-CM | POA: Diagnosis not present

## 2012-06-18 DIAGNOSIS — O9903 Anemia complicating the puerperium: Secondary | ICD-10-CM | POA: Diagnosis not present

## 2012-06-18 DIAGNOSIS — O480 Post-term pregnancy: Secondary | ICD-10-CM | POA: Diagnosis present

## 2012-06-18 LAB — CBC
Hemoglobin: 11 g/dL — ABNORMAL LOW (ref 12.0–15.0)
MCH: 28.5 pg (ref 26.0–34.0)
MCHC: 35.1 g/dL (ref 30.0–36.0)
MCV: 81.1 fL (ref 78.0–100.0)
RBC: 3.86 MIL/uL — ABNORMAL LOW (ref 3.87–5.11)

## 2012-06-18 LAB — AMNISURE RUPTURE OF MEMBRANE (ROM) NOT AT ARMC: Amnisure ROM: POSITIVE

## 2012-06-18 MED ORDER — LIDOCAINE HCL (PF) 1 % IJ SOLN
30.0000 mL | INTRAMUSCULAR | Status: DC | PRN
  Filled 2012-06-18 (×2): qty 30

## 2012-06-18 MED ORDER — OXYTOCIN BOLUS FROM INFUSION: 500.0000 mL | INTRAVENOUS | Status: DC

## 2012-06-18 MED ORDER — CITRIC ACID-SODIUM CITRATE 334-500 MG/5ML PO SOLN: 30.0000 mL | ORAL | Status: DC | PRN

## 2012-06-18 MED ORDER — TERBUTALINE SULFATE 1 MG/ML IJ SOLN: 0.2500 mg | Freq: Once | INTRAMUSCULAR | Status: AC | PRN

## 2012-06-18 MED ORDER — DIBUCAINE 1 % RE OINT: 1.0000 "application " | TOPICAL_OINTMENT | RECTAL | Status: DC | PRN

## 2012-06-18 MED ORDER — ONDANSETRON HCL 4 MG PO TABS: 4.0000 mg | ORAL_TABLET | ORAL | Status: DC | PRN

## 2012-06-18 MED ORDER — FENTANYL 2.5 MCG/ML BUPIVACAINE 1/10 % EPIDURAL INFUSION (WH - ANES)
14.0000 mL/h | INTRAMUSCULAR | Status: DC | PRN
  Administered 2012-06-18: 14 mL/h via EPIDURAL
  Filled 2012-06-18: qty 125

## 2012-06-18 MED ORDER — TETANUS-DIPHTH-ACELL PERTUSSIS 5-2.5-18.5 LF-MCG/0.5 IM SUSP
0.5000 mL | Freq: Once | INTRAMUSCULAR | Status: AC
  Administered 2012-06-18: 0.5 mL via INTRAMUSCULAR

## 2012-06-18 MED ORDER — MAGNESIUM HYDROXIDE 400 MG/5ML PO SUSP: 30.0000 mL | ORAL | Status: DC | PRN

## 2012-06-18 MED ORDER — LIDOCAINE HCL (PF) 1 % IJ SOLN
INTRAMUSCULAR | Status: DC | PRN
  Administered 2012-06-18 (×2): 5 mL

## 2012-06-18 MED ORDER — FENTANYL CITRATE 0.05 MG/ML IJ SOLN
100.0000 ug | INTRAMUSCULAR | Status: DC | PRN
  Administered 2012-06-18 (×2): 100 ug via INTRAVENOUS
  Filled 2012-06-18 (×3): qty 2

## 2012-06-18 MED ORDER — SIMETHICONE 80 MG PO CHEW: 80.0000 mg | CHEWABLE_TABLET | ORAL | Status: DC | PRN

## 2012-06-18 MED ORDER — ONDANSETRON HCL 4 MG/2ML IJ SOLN: 4.0000 mg | Freq: Four times a day (QID) | INTRAMUSCULAR | Status: DC | PRN

## 2012-06-18 MED ORDER — ZOLPIDEM TARTRATE 5 MG PO TABS: 5.0000 mg | ORAL_TABLET | Freq: Every evening | ORAL | Status: DC | PRN

## 2012-06-18 MED ORDER — OXYCODONE-ACETAMINOPHEN 5-325 MG PO TABS
1.0000 | ORAL_TABLET | ORAL | Status: DC | PRN
  Administered 2012-06-19: 1 via ORAL
  Filled 2012-06-18 (×2): qty 1

## 2012-06-18 MED ORDER — OXYTOCIN 40 UNITS IN LACTATED RINGERS INFUSION - SIMPLE MED
1.0000 m[IU]/min | INTRAVENOUS | Status: DC
  Administered 2012-06-18: 1 m[IU]/min via INTRAVENOUS
  Administered 2012-06-18: 6 m[IU]/min via INTRAVENOUS

## 2012-06-18 MED ORDER — LANOLIN HYDROUS EX OINT: TOPICAL_OINTMENT | CUTANEOUS | Status: DC | PRN

## 2012-06-18 MED ORDER — DIPHENHYDRAMINE HCL 25 MG PO CAPS: 25.0000 mg | ORAL_CAPSULE | Freq: Four times a day (QID) | ORAL | Status: DC | PRN

## 2012-06-18 MED ORDER — EPHEDRINE 5 MG/ML INJ
10.0000 mg | INTRAVENOUS | Status: DC | PRN
  Filled 2012-06-18: qty 2

## 2012-06-18 MED ORDER — ACETAMINOPHEN 325 MG PO TABS: 650.0000 mg | ORAL_TABLET | ORAL | Status: DC | PRN

## 2012-06-18 MED ORDER — PHENYLEPHRINE 40 MCG/ML (10ML) SYRINGE FOR IV PUSH (FOR BLOOD PRESSURE SUPPORT)
80.0000 ug | PREFILLED_SYRINGE | INTRAVENOUS | Status: DC | PRN
  Filled 2012-06-18: qty 5
  Filled 2012-06-18: qty 2

## 2012-06-18 MED ORDER — LACTATED RINGERS IV SOLN: 500.0000 mL | INTRAVENOUS | Status: DC | PRN

## 2012-06-18 MED ORDER — BENZOCAINE-MENTHOL 20-0.5 % EX AERO
1.0000 "application " | INHALATION_SPRAY | CUTANEOUS | Status: DC | PRN
  Administered 2012-06-18: 1 via TOPICAL
  Filled 2012-06-18: qty 56

## 2012-06-18 MED ORDER — PHENYLEPHRINE 40 MCG/ML (10ML) SYRINGE FOR IV PUSH (FOR BLOOD PRESSURE SUPPORT)
80.0000 ug | PREFILLED_SYRINGE | INTRAVENOUS | Status: DC | PRN
  Filled 2012-06-18: qty 2

## 2012-06-18 MED ORDER — OXYTOCIN 40 UNITS IN LACTATED RINGERS INFUSION - SIMPLE MED
62.5000 mL/h | INTRAVENOUS | Status: DC
  Administered 2012-06-18: 62.5 mL/h via INTRAVENOUS
  Filled 2012-06-18: qty 1000

## 2012-06-18 MED ORDER — WITCH HAZEL-GLYCERIN EX PADS: 1.0000 "application " | MEDICATED_PAD | CUTANEOUS | Status: DC | PRN

## 2012-06-18 MED ORDER — MEDROXYPROGESTERONE ACETATE 150 MG/ML IM SUSP
150.0000 mg | INTRAMUSCULAR | Status: AC | PRN
  Administered 2012-06-20: 150 mg via INTRAMUSCULAR
  Filled 2012-06-18: qty 1

## 2012-06-18 MED ORDER — LACTATED RINGERS IV SOLN
500.0000 mL | Freq: Once | INTRAVENOUS | Status: AC
  Administered 2012-06-18: 500 mL via INTRAVENOUS

## 2012-06-18 MED ORDER — PRENATAL MULTIVITAMIN CH
1.0000 | ORAL_TABLET | Freq: Every day | ORAL | Status: DC
  Administered 2012-06-18 – 2012-06-19 (×2): 1 via ORAL
  Filled 2012-06-18 (×3): qty 1

## 2012-06-18 MED ORDER — EPHEDRINE 5 MG/ML INJ
10.0000 mg | INTRAVENOUS | Status: DC | PRN
  Filled 2012-06-18: qty 4
  Filled 2012-06-18: qty 2

## 2012-06-18 MED ORDER — IBUPROFEN 600 MG PO TABS
600.0000 mg | ORAL_TABLET | Freq: Four times a day (QID) | ORAL | Status: DC | PRN
  Filled 2012-06-18 (×6): qty 1

## 2012-06-18 MED ORDER — SENNOSIDES-DOCUSATE SODIUM 8.6-50 MG PO TABS
2.0000 | ORAL_TABLET | Freq: Every day | ORAL | Status: DC
  Administered 2012-06-18 – 2012-06-19 (×2): 2 via ORAL

## 2012-06-18 MED ORDER — ONDANSETRON HCL 4 MG/2ML IJ SOLN: 4.0000 mg | INTRAMUSCULAR | Status: DC | PRN

## 2012-06-18 MED ORDER — IBUPROFEN 600 MG PO TABS
600.0000 mg | ORAL_TABLET | Freq: Four times a day (QID) | ORAL | Status: DC
  Administered 2012-06-18 – 2012-06-20 (×9): 600 mg via ORAL
  Filled 2012-06-18 (×2): qty 1

## 2012-06-18 MED ORDER — LACTATED RINGERS IV SOLN
INTRAVENOUS | Status: DC
  Administered 2012-06-18: 07:00:00 via INTRAVENOUS

## 2012-06-18 MED ORDER — OXYCODONE-ACETAMINOPHEN 5-325 MG PO TABS
1.0000 | ORAL_TABLET | ORAL | Status: DC | PRN
  Administered 2012-06-19: 1 via ORAL

## 2012-06-18 MED ORDER — DIPHENHYDRAMINE HCL 50 MG/ML IJ SOLN: 12.5000 mg | INTRAMUSCULAR | Status: DC | PRN

## 2012-06-18 NOTE — Progress Notes (Signed)
  Subjective: Call to [facility] by RN d/t pt c/o increased pressure.  Pt is visibly uncomfortable. Pt reports feeling pressure during and between UCs.  Objective: BP 123/68  Pulse 106  Temp(Src) 97.8 F (36.6 C) (Axillary)  Resp 18  Ht 5\' 6"  (1.676 m)  Wt 151 lb 9.6 oz (68.765 kg)  BMI 24.48 kg/m2  SpO2 98%  LMP 09/02/2011     FHT:  Cat I UC:   regular, every 1-3 minutes  SVE:   Dilation: 10 Effacement (%): 80;90 Station: +1 Exam by:: j Shawnell Dykes, CNM  Assessment / Plan: Complete dilation, laboring down Uncomfortable  Dr. Stefano Gaul notified Epidural dosed by RN  Beckey Downing, Dvon Jiles 06/18/2012, 9:35 AM

## 2012-06-18 NOTE — Anesthesia Procedure Notes (Signed)
Epidural Patient location during procedure: OB Start time: 06/18/2012 7:01 AM  Staffing Anesthesiologist: Angus Seller., Harrell Gave. Performed by: anesthesiologist   Preanesthetic Checklist Completed: patient identified, site marked, surgical consent, pre-op evaluation, timeout performed, IV checked, risks and benefits discussed and monitors and equipment checked  Epidural Patient position: sitting Prep: site prepped and draped and DuraPrep Patient monitoring: continuous pulse ox and blood pressure Approach: midline Injection technique: LOR air  Needle:  Needle type: Tuohy  Needle gauge: 17 G Needle length: 9 cm and 9 Needle insertion depth: 5 cm cm Catheter type: closed end flexible Catheter size: 19 Gauge Catheter at skin depth: 10 cm Test dose: negative  Assessment Events: blood not aspirated, injection not painful, no injection resistance, negative IV test and no paresthesia  Additional Notes Patient identified.  Risk benefits discussed including failed block, incomplete pain control, headache, nerve damage, paralysis, blood pressure changes, nausea, vomiting, reactions to medication both toxic or allergic, and postpartum back pain.  Patient expressed understanding and wished to proceed.  All questions were answered.  Sterile technique used throughout procedure and epidural site dressed with sterile barrier dressing. No paresthesia or other complications noted.The patient did not experience any signs of intravascular injection such as tinnitus or metallic taste in mouth nor signs of intrathecal spread such as rapid motor block. Please see nursing notes for vital signs.

## 2012-06-18 NOTE — Progress Notes (Signed)
Placed baby to Rt. Breast for breast feeding. Noted baby sucking on top lip, cont. To close mouth. Re-applied to breast, pulling down on chin, compressing breast tissue into mouth. C/o pain at first latch, after chin pull, stated no pain. Pt. Kept saying "I don't think breast feeding is for me, I don't like it. It hurts, this is to difficult." I explained that it takes time for baby and mom to learn to breast feed and that we would assist. Cont. To teach and demonstrate proper signs of latch and different positioning. Baby fed for 25 min. I was able to easily express colostrum from both breast. Noted Lt. Nipple slight flat, compresses inwards. Breast filling. Hand pump applied, noted instant flow of colostrum. Explained we should pump to relieve some of the colostrum to soften to nipples to help compress easily into babies mouth. Pt. Stated she didn't want to pump. Stated she wants to bottle feed only. LC notified.

## 2012-06-18 NOTE — Progress Notes (Signed)
  Subjective: Pt resting comfortably with epidural in place.  Discussed with pt her lack of sleep last night and her need to rest today.  Offered to answer any questions she had, and reviewed POC. Pt did not have any questions at this time and pt verbalized agreement with POC.  Objective: BP 118/65  Pulse 69  Temp(Src) 97.8 F (36.6 C) (Axillary)  Resp 18  Ht 5\' 6"  (1.676 m)  Wt 151 lb 9.6 oz (68.765 kg)  BMI 24.48 kg/m2  SpO2 98%  LMP 09/02/2011     FHT:  Cat I UC:   regular, every 1-5 minutes Pitocin: 6  SVE deferred at this time d/t SROM  SVE @0630 :   Dilation: 3 Effacement (%): 80;90 Station: -2 Exam by:: H Stone RN    Assessment / Plan: [redacted]w[redacted]d  Post term pregnancy (IOL was scheduled for tonight) SROM w/ early labor  Cat I FHT  GBS neg   Continue with pitocin induction Continue to monitor labor progress Support as needed  Anticipate SVD  C/w Dr Stefano Gaul prn     Tasha Wu 06/18/2012, 8:25 AM

## 2012-06-18 NOTE — Anesthesia Postprocedure Evaluation (Addendum)
Anesthesia Post Note      Anesthesia type: Epidural  Patient location: Mother/Baby  Post pain: Pain level controlled  Post assessment: Post-op Vital signs reviewed  Last Vitals: BP 118/73  Pulse 83  Temp(Src) 36.7 C (Oral)  Resp 20  Ht 5\' 6"  (1.676 m)  Wt 151 lb 9.6 oz (68.765 kg)  BMI 24.48 kg/m2  SpO2 100%  LMP 09/02/2011  Post vital signs: Reviewed  Level of consciousness: awake  Complications: No apparent anesthesia complications

## 2012-06-18 NOTE — Anesthesia Preprocedure Evaluation (Signed)
Anesthesia Evaluation  Patient identified by MRN, date of birth, ID band Patient awake    Reviewed: Allergy & Precautions, H&P , Patient's Chart, lab work & pertinent test results  Airway Mallampati: II TM Distance: >3 FB Neck ROM: full    Dental no notable dental hx.    Pulmonary neg pulmonary ROS,  breath sounds clear to auscultation  Pulmonary exam normal       Cardiovascular negative cardio ROS  Rhythm:regular Rate:Normal     Neuro/Psych  Headaches, negative neurological ROS  negative psych ROS   GI/Hepatic negative GI ROS, Neg liver ROS,   Endo/Other  negative endocrine ROS  Renal/GU Renal diseasenegative Renal ROS     Musculoskeletal   Abdominal   Peds  Hematology negative hematology ROS (+)   Anesthesia Other Findings Infection   Yeast;would get frequently Infection   BV x 1    Kidney infection 2010 PO antibxs Migraines   no rx'd meds in the past;goes to sleep    Umbilical hernia 1991 Does not cause anyp problems Emotional instability   Break down easily when things are not going her way    Anxiety 2010 anxiety attack;no meds;has not been dx'd Hx of joint problems   Shoulders and knees have pain    Abnormal Pap smear 12/10/2011    Reproductive/Obstetrics (+) Pregnancy                           Anesthesia Physical Anesthesia Plan  ASA: II  Anesthesia Plan: Epidural   Post-op Pain Management:    Induction:   Airway Management Planned:   Additional Equipment:   Intra-op Plan:   Post-operative Plan:   Informed Consent: I have reviewed the patients History and Physical, chart, labs and discussed the procedure including the risks, benefits and alternatives for the proposed anesthesia with the patient or authorized representative who has indicated his/her understanding and acceptance.     Plan Discussed with:   Anesthesia Plan Comments:         Anesthesia Quick  Evaluation

## 2012-06-18 NOTE — H&P (Signed)
Tasha Wu is a 22 y.o.SB female presenting at [redacted]w[redacted]d w/ CC of SROM right before 0130.  Fluid has been clear per her history and has leaked out multiple times.  She saw [facility] w/ wiping when went to [facility] after initial gush; none since.  No current headache, but did have one yesterday.  No fever or recent illness.  No UTI or PIH s/s.  Decreased FM last night after supper, but active since in MAU.   Accompanied by a female and female visitor to [facility] this AM.  Last ate around 2000 last night.  Pt was originally scheduled for IOL for 1930 tonight for post term.  Prenatal Course: Pt entered care at [facility] at 12 weeks; ASCUS pap on 11/29/11, but neg for HR HPV, so plan is to repeat in 60yr.  Her pregnancy has been uncomplicated. She had a normal first trimester screen and AFP.  Normal anatomy u/s at [redacted]w[redacted]d.  Her 1hr gtt=110.  She had appropriate wt gain and pregnancy discomforts.  U/s for S<D at 34 weeks showed linear growth.  U/s at 37 weeks for unsure presentation showed Vtx.    OB Hx: G1= EAB '09 at 7wks G2=current  Maternal Medical History:  Reason for admission: Rupture of membranes.   Contractions: Onset was less than 1 hour ago.   Frequency: irregular.   Perceived severity is moderate.    Fetal activity: Perceived fetal activity is decreased.   Last perceived fetal movement was within the past hour.      OB History   Grav Para Term Preterm Abortions TAB SAB Ect Mult Living   2    1          Past Medical History  Diagnosis Date  . Infection     Yeast;would get frequently  . Infection     BV x 1  . Kidney infection 2010    PO antibxs  . Migraines     no rx'd meds in the past;goes to sleep  . Umbilical hernia 1991    Does not cause anyp problems  . Emotional instability     Break down easily when things are not going her way  . Anxiety 2010    anxiety attack;no meds;has not been dx'd  . Hx of joint problems     Shoulders and knees have pain  . Abnormal Pap smear 12/10/2011   Past  Surgical History  Procedure Laterality Date  . Dilation and curettage of uterus  2009    No complications   Family History: family history includes Anemia in her mother; Arthritis in her paternal grandmother; Asthma in her sister; Diabetes in her maternal aunt and paternal grandfather; Diabetes type I in her cousin; Heart disease in her maternal aunt; Hernia in her maternal aunt; Hypertension in her maternal uncle and mother; Hyperthyroidism in her paternal grandmother; Migraines in her father; Other in her father; Scoliosis in her paternal grandmother; Sickle cell anemia in her maternal aunt and maternal grandmother; and Sickle cell trait in her brother and mother. Social History:  reports that she quit smoking about 7 months ago. Her smoking use included Cigarettes. She smoked 0.20 packs per day. She has never used smokeless tobacco. She reports that she drinks about 3.5 ounces of alcohol per week. She reports that she does not use illicit drugs. Pt is unemployed; not attending school.  Prenatal Transfer Tool  Maternal Diabetes: No Genetic Screening: Normal Maternal Ultrasounds/Referrals: Normal Fetal Ultrasounds or other Referrals:  None Maternal Substance Abuse:  No Significant Maternal Medications:  None Significant Maternal Lab Results:  Lab values include: Group B Strep negative Other Comments:  previous smoker  Review of Systems  Constitutional: Negative.   HENT: Positive for sore throat.   Respiratory: Positive for shortness of breath.   Gastrointestinal: Negative.   Genitourinary: Negative.   Skin: Negative.   Neurological: Negative.     Dilation: 1.5 Effacement (%): 80 Station: -2 Exam by:: Hiliary Char Feltman, CNM Temperature 97.4 F (36.3 C), temperature source Oral, height 5\' 6"  (1.676 m), weight 151 lb 9.6 oz (68.765 kg), last menstrual period 09/02/2011. Maternal Exam:  Uterine Assessment: Contraction strength is mild.  Contraction frequency is irregular.  UC's q  6-9  Abdomen: Patient reports no abdominal tenderness. Estimated fetal weight is 6.5-7.5 lbs.   Fetal presentation: vertex  Introitus: Normal vulva. Normal vagina.  Ferning test: negative.  Nitrazine test: not done.  Pelvis: adequate for delivery.   Cervix: Cervix evaluated by sterile speculum exam and digital exam.     Fetal Exam Fetal Monitor Review: Mode: ultrasound.   Baseline rate: 130.  Variability: moderate (6-25 bpm).   Pattern: accelerations present and no decelerations.    Fetal State Assessment: Category I - tracings are normal.     Physical Exam  Constitutional: She is oriented to person, place, and time. She appears well-developed and well-nourished. She appears distressed.  Tense during ctxs w/ grimace; holds her breath; no distress in between  HENT:  Head: Normocephalic and atraumatic.  Eyes: Pupils are equal, round, and reactive to light.  Cardiovascular: Normal rate.   Respiratory: Effort normal.  GI: Soft.  gravid  Musculoskeletal: She exhibits no edema.  Neurological: She is alert and oriented to person, place, and time. She has normal reflexes.  Skin: Skin is warm and dry.  Psychiatric: She has a normal mood and affect. Her behavior is normal. Judgment and thought content normal.   .. Results for orders placed during the hospital encounter of 06/18/12 (from the past 24 hour(s))  AMNISURE RUPTURE OF MEMBRANE (ROM)     Status: None   Collection Time    06/18/12  2:55 AM      Result Value Range   Amnisure ROM POSITIVE     Prenatal labs: ABO, Rh: O/POS/-- (09/19 1120) Antibody: NEG (09/19 1120) Rubella: 159.6 (09/19 1120) RPR: NON REAC (01/27 0856)  HBsAg: NEGATIVE (09/19 1120)  HIV: NON REACTIVE (09/19 1120)  GBS: NEGATIVE (02/24 1229)   Assessment/Plan: 1. [redacted]w[redacted]d 2. Post term pregnancy 3. SROM w/ early labor 4. Cat I FHT 5. GBS neg  1. Admit to [facility] w/ Dr. Pennie Rushing as attending 2. Routine L&D orders 3. Pitocin per low-dose protocol 4. IV  pain medicine prn at present; enc'd epidural as labor progressed with time 5. Support as needed 6. Anticipate SVD 7. C/w MD prn  Jaclyn Carew H 06/18/2012, 3:14 AM

## 2012-06-18 NOTE — MAU Note (Signed)
Pt states srom at 0130 this am and uc's.  States fluid is clear. Clear glistening fluid noted on visualization

## 2012-06-19 LAB — CBC
MCH: 27.9 pg (ref 26.0–34.0)
MCV: 82 fL (ref 78.0–100.0)
Platelets: 178 10*3/uL (ref 150–400)
RBC: 4.23 MIL/uL (ref 3.87–5.11)
RDW: 14.4 % (ref 11.5–15.5)

## 2012-06-19 NOTE — Progress Notes (Signed)
UR chart review completed.  

## 2012-06-19 NOTE — Progress Notes (Signed)
Patient ID: Tasha Wu, female   DOB: 20-Jul-1990, 22 y.o.   MRN: [id number] Post Partum Day 1  Subjective: no complaints, up ad lib without syncope, voiding, tolerating PO,  Pain well controlled with po meds,  Bottlefeeding  Mood stable, bonding well Contraception: depo-provera,    Objective: Blood pressure 104/69, pulse 67, temperature 97.6 F (36.4 C), temperature source Oral, resp. rate 18, height 5\' 6"  (1.676 m), weight 151 lb 9.6 oz (68.765 kg), last menstrual period 09/02/2011, SpO2 100.00%, unknown if currently breastfeeding.  Physical Exam:  General: NAD  Lochia: appropriate Uterine Fundus: firm Perineum: WNL DVT Evaluation: No evidence of DVT seen on physical exam. Negative Homan's sign. No significant calf/ankle edema.   Recent Labs  06/18/12 0345 06/19/12 0640  HGB 11.0* 11.8*  HCT 31.3* 34.7*    Assessment/Plan: Stable PPD1  Plan for discharge tomorrow and Contraception depo-provera       LOS: 1 day   Na Waldrip M 06/19/2012, 4:55 PM

## 2012-06-19 NOTE — Progress Notes (Signed)
Patient was referred for history of depression/anxiety.  * Referral screened out by Clinical Social Worker because none of the following criteria appear to apply:  ~ History of anxiety/depression during this pregnancy, or of post-partum depression.  ~ Diagnosis of anxiety and/or depression within last 3 years  ~ History of depression due to pregnancy loss/loss of child  OR  * Patient's symptoms currently being treated with medication and/or therapy.  Please contact the Clinical Social Worker if needs arise, or by the patient's request.  Anxiety symptoms were situational in 2010, as per pt. She denies any issues since then.

## 2012-06-20 MED ORDER — IBUPROFEN 600 MG PO TABS: 600.0000 mg | ORAL_TABLET | Freq: Four times a day (QID) | ORAL | Status: DC

## 2012-06-20 MED ORDER — OXYCODONE-ACETAMINOPHEN 5-325 MG PO TABS: 1.0000 | ORAL_TABLET | ORAL | Status: DC | PRN

## 2012-06-20 NOTE — Discharge Summary (Signed)
  Vaginal Delivery Discharge Summary  Tasha Wu  DOB:    10/02/1990 MRN:    [id number] CSN:    [id number]  Date of admission:                  06/18/12  Date of discharge:                   06/20/12  Procedures this admission: SVD with 1st degree lac  Date of Delivery: 06/18/12 by Haroldine Laws  Newborn Data:  Live born female  Birth Weight: 6 lb 10 oz (3005 g) APGAR: 9, 9  Home with mother. Name: "Tasha Wu" Circumcision Plan: n/a  History of Present Illness:  Ms. [name] is a 22 y.o. female, G2P1011, who presents at [redacted]w[redacted]d weeks gestation. The patient has been followed at the [facility] and Gynecology division of [facility]. She was admitted rupture of membranes in early labor. Her pregnancy has been complicated by:   Patient Active Problem List  Diagnosis  . Umbilical hernia  . H/O pyelonephritis  . Abnormal Pap smear  . Post term pregnancy, antepartum condition or complication   Hospital course:  The patient was admitted for SROM in early labor.   Her labor was not complicated. She proceeded to have a vaginal delivery of a healthy infant. Her delivery was not complicated. Her postpartum course was not complicated.  She was discharged to home on postpartum day 2 doing well.  Feeding:  bottle  Contraception:  Depo-Provera at discharge; would like to discuss other options at [facility] visit  Discharge hemoglobin:  Hemoglobin  Date Value Range Status  06/19/2012 11.8* 12.0 - 15.0 g/dL Final     HCT  Date Value Range Status  06/19/2012 34.7* 36.0 - 46.0 % Final    Discharge Physical Exam:   General: alert and no distress Lochia: appropriate Uterine Fundus: firm Incision: healing well DVT Evaluation: No evidence of DVT seen on physical exam.  Intrapartum Procedures: spontaneous vaginal delivery Postpartum Procedures: none Complications-Operative and Postpartum: 1st degree perineal laceration  Discharge  Diagnoses: Term Pregnancy-delivered and mild asymptomatic anemia  Discharge Information:  Activity:           per [facility] handbook Diet:                routine Medications: Ibuprofen, Iron and Percocet Condition:      stable Instructions:  refer to practice specific booklet Discharge to: home   ASCUS pap on 11/29/11 - needs f/u pap in 1 year - pt informed and encouraged to make appointment - pt verbalizes understanding.    Follow-up Information   Follow up with [facility] Gy[facility]. Schedule an appointment as soon as possible for a visit in 6 weeks. (Call with any quesitons or concerns)    Contact information:   [address] [place] [address] [phone]       Haroldine Laws 06/20/2012

## 2012-06-23 ENCOUNTER — Encounter: Payer: Self-pay | Admitting: Obstetrics and Gynecology

## 2012-10-02 ENCOUNTER — Encounter (HOSPITAL_COMMUNITY): Payer: Self-pay

## 2012-10-02 ENCOUNTER — Emergency Department (INDEPENDENT_AMBULATORY_CARE_PROVIDER_SITE_OTHER): Payer: Medicaid Other

## 2012-10-02 ENCOUNTER — Emergency Department (HOSPITAL_COMMUNITY)
Admission: EM | Admit: 2012-10-02 | Discharge: 2012-10-02 | Disposition: A | Discharge status: Home / Self Care | Payer: Medicaid Other | Source: Home / Self Care

## 2012-10-02 DIAGNOSIS — K219 Gastro-esophageal reflux disease without esophagitis: Secondary | ICD-10-CM

## 2012-10-02 IMAGING — CR DG CHEST 2V
2 series · 2 of 2 positions shown · non-contrast
Comparison: None.

CLINICAL DATA: Chest pain

CHEST - 2 VIEW

[view not recorded (1 of 2)]
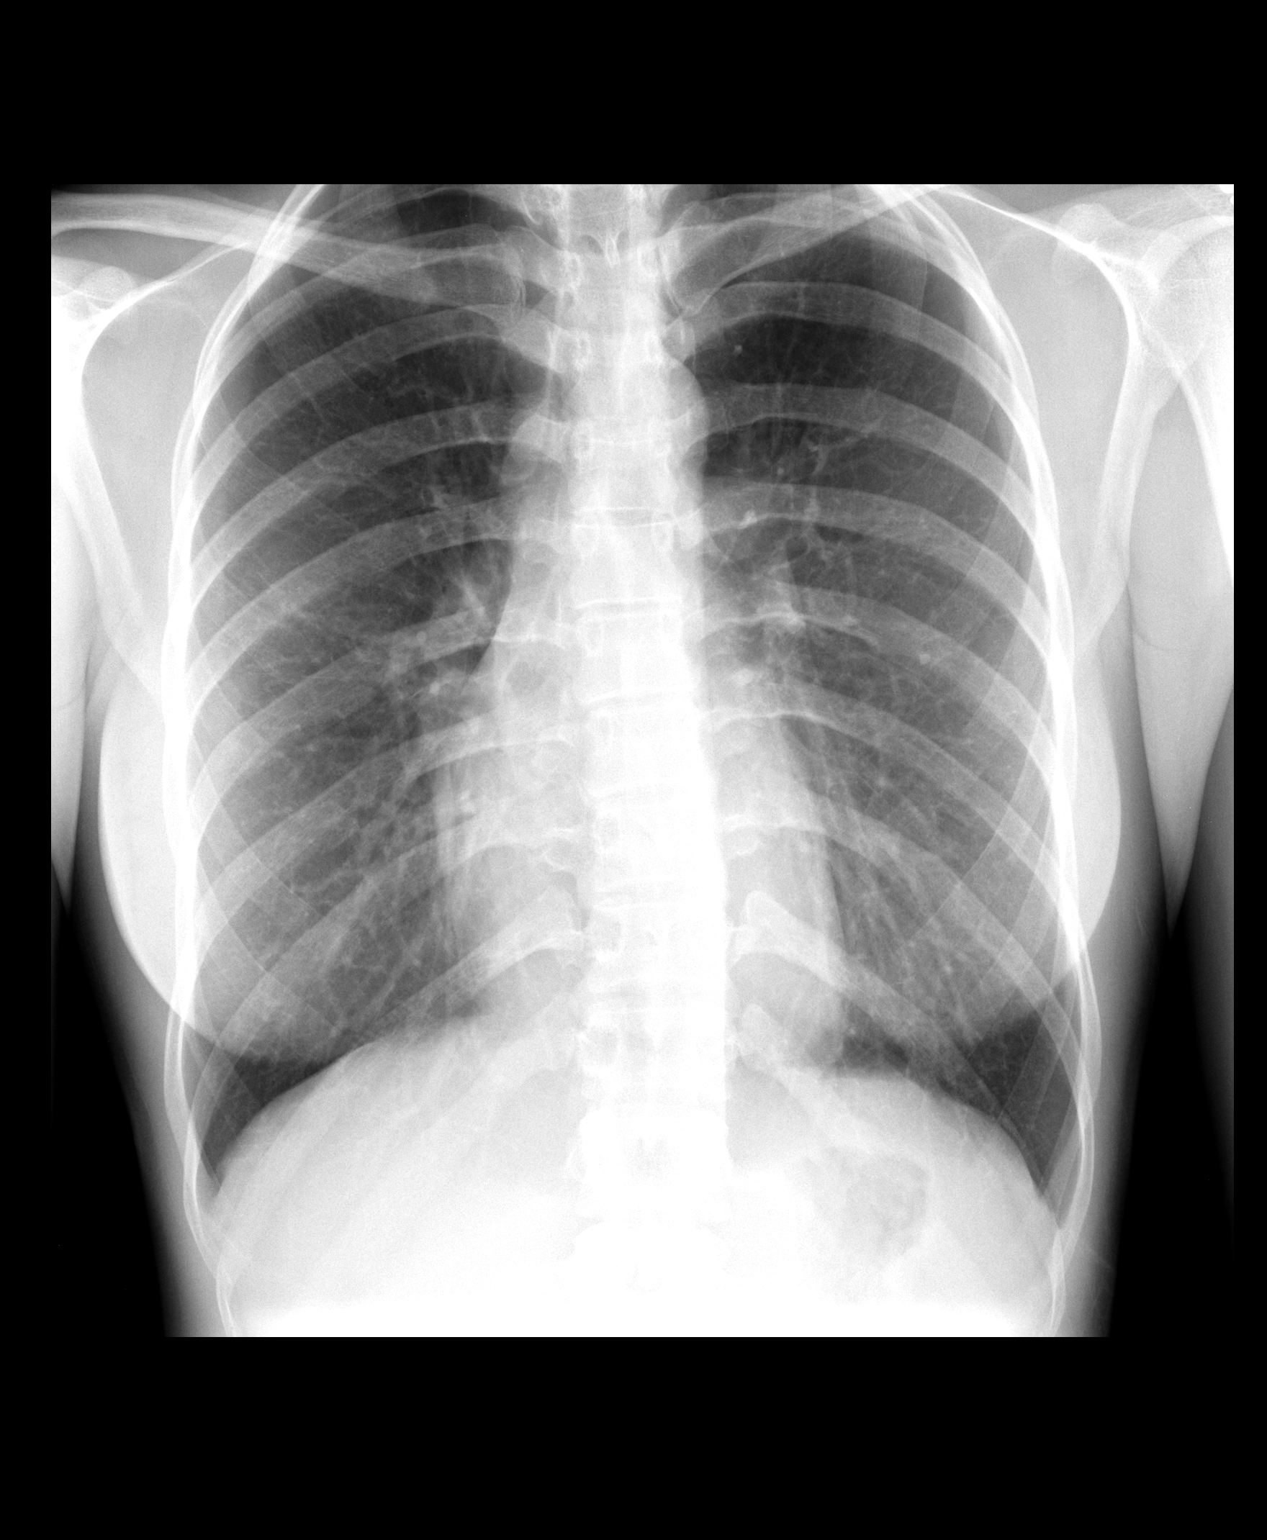

[view not recorded (2 of 2)]
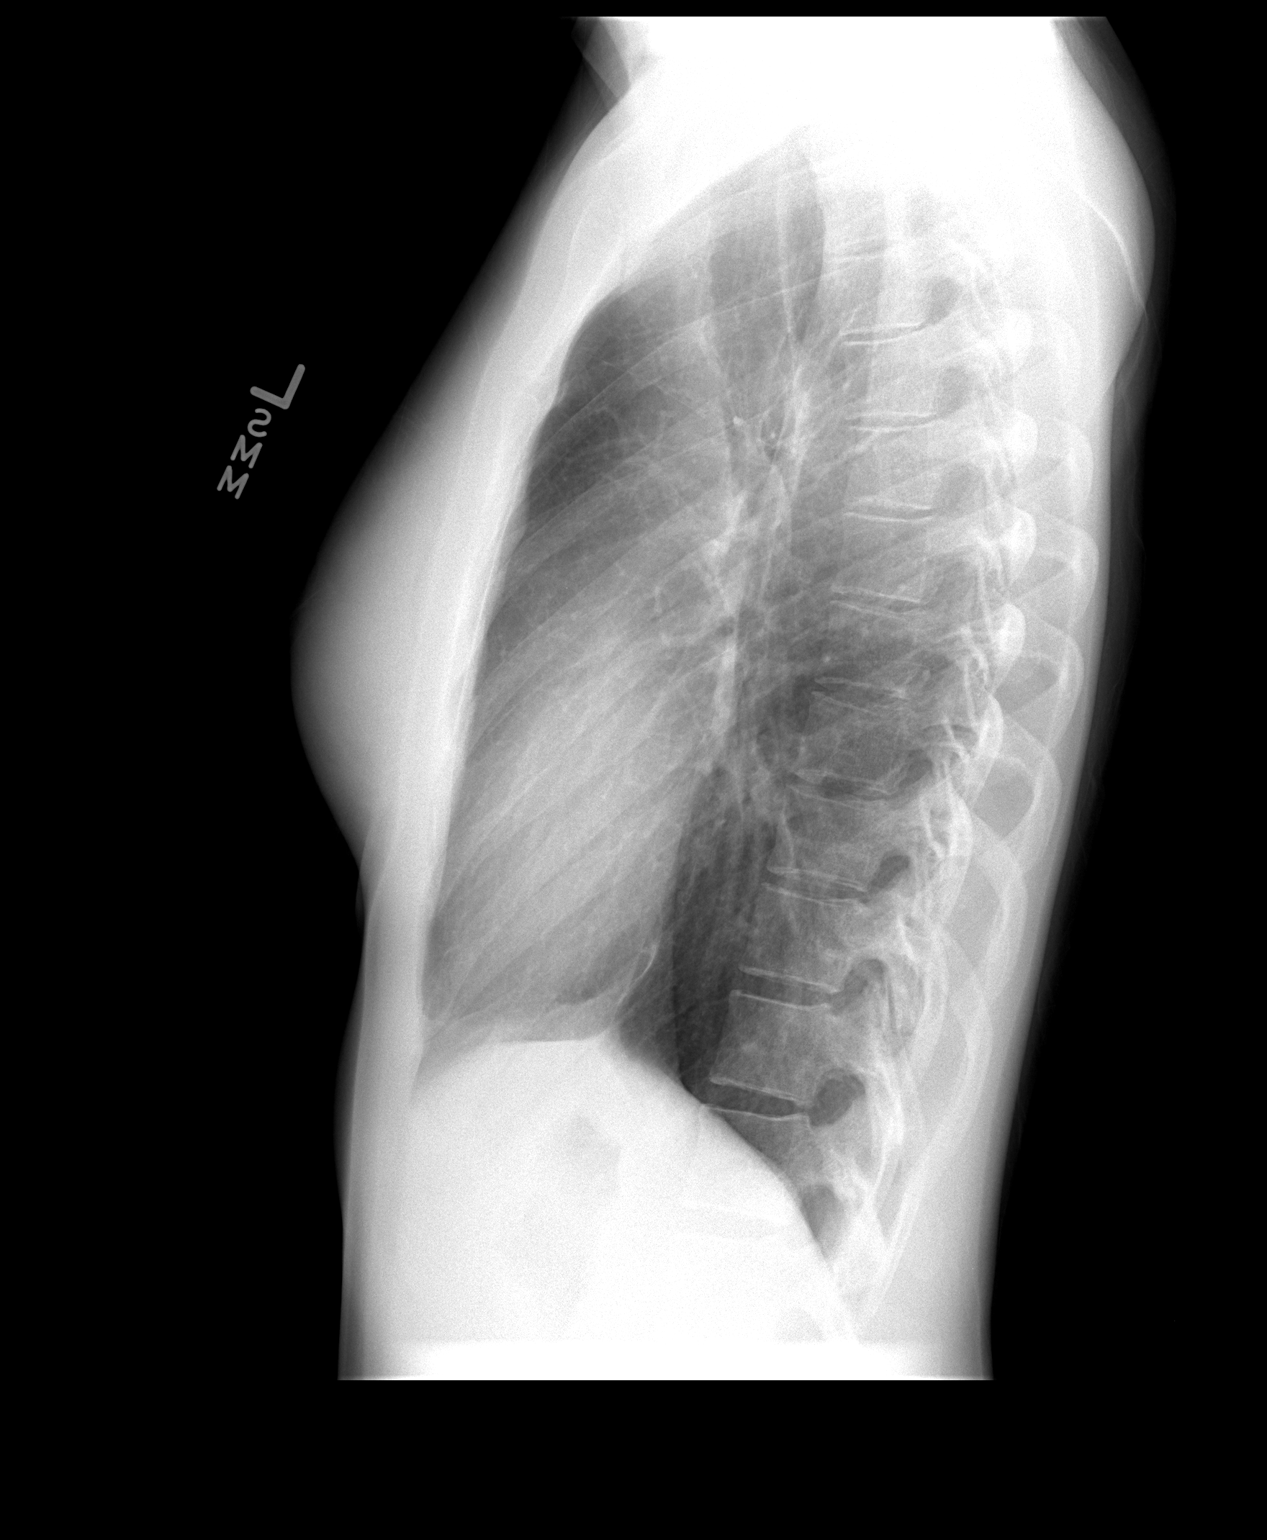

[2 of 2 positions shown; findings below may reference images not displayed]

FINDINGS: Lungs clear.  Heart size and pulmonary vascularity are
normal.  No adenopathy.  No pneumothorax.  No bone lesions.  There
is a slight degree of lower thoracic levoscoliosis.
IMPRESSION: Slight scoliosis.  Study otherwise unremarkable.

## 2012-10-02 MED ORDER — OMEPRAZOLE 40 MG PO CPDR: 40.0000 mg | DELAYED_RELEASE_CAPSULE | Freq: Every day | ORAL | Status: DC

## 2012-10-02 MED ORDER — GI COCKTAIL ~~LOC~~
ORAL | Status: AC
  Filled 2012-10-02: qty 30

## 2012-10-02 MED ORDER — GI COCKTAIL ~~LOC~~
30.0000 mL | Freq: Once | ORAL | Status: AC
  Administered 2012-10-02: 30 mL via ORAL

## 2012-10-02 NOTE — ED Notes (Signed)
Reports  1 month duration of pain w breathing , usually goes away , but last night was more severe, and pain did not respond to body position changes as usual ; statwes she has "bad circulation, because her legs go to sleep all the time" NAD at present

## 2012-10-02 NOTE — ED Provider Notes (Signed)
Tasha Wu is a 22 y.o. female who presents to Urgent Care today for chest pain. Patient has had chest pain and tightness especially at night for the last month or so. However this morning at about 6:00 in the morning she noted significant onset of central chest pain tightness that was brief and nonradiating. She denies any palpitations fevers or chills associated with this. She notes that she does have a history of heartburn but this symptoms are not consistent with prior episodes of reflux. Additionally she notes that she is about 3 months status post delivery of her child and not currently breast-feeding. She had a normal pregnancy. She does have a history of anxiety. No nausea vomiting or diarrhea.    PMH reviewed. As above History  Substance Use Topics  . Smoking status: Former Smoker -- 0.20 packs/day    Types: Cigarettes    Quit date: 11/18/2011  . Smokeless tobacco: Never Used  . Alcohol Use: 3.5 oz/week    7 drink(s) per week     Comment: Has stopped since pregnancy   ROS as above Medications reviewed. No current facility-administered medications for this encounter.   Current Outpatient Prescriptions  Medication Sig Dispense Refill  . ibuprofen (ADVIL,MOTRIN) 600 MG tablet Take 1 tablet (600 mg total) by mouth every 6 (six) hours.  30 tablet  1  . IRON PO Take 1 tablet by mouth daily.      Marland Kitchen omeprazole (PRILOSEC) 40 MG capsule Take 1 capsule (40 mg total) by mouth daily.  30 capsule  1    Exam:  BP 119/80  Pulse 68  Temp(Src) 97.4 F (36.3 C) (Oral)  Resp 16  SpO2 100% Gen: Well NAD HEENT: EOMI,  MMM Lungs: CTABL Nl WOB Heart: RRR no MRG Abd: NABS, NT, ND Exts: Non edematous BL  LE, warm and well perfused.   Patient had significant subjective improvement in her discomfort following GI cocktail.   No results found for this or any previous visit (from the past 24 hour(s)). Dg Chest 2 View  10/02/2012   *RADIOLOGY REPORT*  Clinical Data: Chest pain  CHEST - 2  VIEW  Comparison: None.  Findings: Lungs clear.  Heart size and pulmonary vascularity are normal.  No adenopathy.  No pneumothorax.  No bone lesions.  There is a slight degree of lower thoracic levoscoliosis.  IMPRESSION: Slight scoliosis.  Study otherwise unremarkable.   Original Report Authenticated By: Bretta Bang, M.D.    12 lead EKG shows NSR @ 67 BPM with a mild rightward axis with no significant ST segment abnormalities.   Assessment and Plan: 22 y.o. female with likely gastritis.  Patient has heartburn and has had pain in her chest off and on. Her symptoms improve with GI cocktail in her chest x-ray and EKG were unremarkable. I think the most likely explanation is gastritis versus reflux. Plan to treat her empirically with a trial of omeprazole.  If she does not improve she should followup with a  primary care Dr. Discussed warning signs or symptoms. Please see discharge instructions. Patient expresses understanding.      Rodolph Bong, MD 10/02/12 1005

## 2012-10-04 NOTE — ED Notes (Signed)
Message from patient on nurse line , patient asked her omeprazole Rx be switched to [facility], [address]. Left message on machine for pharmacist

## 2012-10-05 NOTE — ED Notes (Signed)
Patient called and advised that she was unable to get RX at [facility] because they closed, per her request Omeprazole 40 mg was called to [facility] [address] refill, DAH

## 2013-01-08 ENCOUNTER — Other Ambulatory Visit: Payer: Self-pay

## 2013-03-05 NOTE — L&D Delivery Note (Signed)
Delivery Note At 12:34 PM a viable female was delivered via Vaginal, Spontaneous Delivery (Presentation: Right Occiput Anterior).  APGAR: 9, 9; weight pending.   Placenta status: Intact, Spontaneous.  Cord:  with the following complications: None.    Anesthesia: Epidural  Episiotomy: None Lacerations: Small hemastatic 1st degree perineal laceration, no repair required Suture Repair: None Est. Blood Loss (mL): 300  Mom to postpartum.  Baby to Couplet care / Skin to Skin.  William DaltonMcEachern, Asyria Kolander 12/28/2013, 12:46 PM

## 2013-04-28 ENCOUNTER — Inpatient Hospital Stay (HOSPITAL_COMMUNITY)
Admission: AD | Admit: 2013-04-28 | Discharge: 2013-04-28 | Disposition: A | Discharge status: Home / Self Care | Payer: Medicaid Other | Source: Ambulatory Visit | Attending: Family Medicine | Admitting: Family Medicine

## 2013-04-28 ENCOUNTER — Inpatient Hospital Stay (HOSPITAL_COMMUNITY): Payer: Medicaid Other

## 2013-04-28 ENCOUNTER — Encounter (HOSPITAL_COMMUNITY): Payer: Self-pay | Admitting: *Deleted

## 2013-04-28 DIAGNOSIS — O26859 Spotting complicating pregnancy, unspecified trimester: Secondary | ICD-10-CM

## 2013-04-28 DIAGNOSIS — O4690 Antepartum hemorrhage, unspecified, unspecified trimester: Secondary | ICD-10-CM

## 2013-04-28 DIAGNOSIS — O209 Hemorrhage in early pregnancy, unspecified: Secondary | ICD-10-CM | POA: Insufficient documentation

## 2013-04-28 DIAGNOSIS — O468X1 Other antepartum hemorrhage, first trimester: Secondary | ICD-10-CM

## 2013-04-28 DIAGNOSIS — O43899 Other placental disorders, unspecified trimester: Secondary | ICD-10-CM

## 2013-04-28 DIAGNOSIS — O368990 Maternal care for other specified fetal problems, unspecified trimester, not applicable or unspecified: Secondary | ICD-10-CM | POA: Insufficient documentation

## 2013-04-28 DIAGNOSIS — O418X10 Other specified disorders of amniotic fluid and membranes, first trimester, not applicable or unspecified: Secondary | ICD-10-CM

## 2013-04-28 DIAGNOSIS — O99330 Smoking (tobacco) complicating pregnancy, unspecified trimester: Secondary | ICD-10-CM | POA: Insufficient documentation

## 2013-04-28 DIAGNOSIS — Z3490 Encounter for supervision of normal pregnancy, unspecified, unspecified trimester: Secondary | ICD-10-CM

## 2013-04-28 HISTORY — DX: Gastro-esophageal reflux disease without esophagitis: K21.9

## 2013-04-28 LAB — WET PREP, GENITAL
Trich, Wet Prep: NONE SEEN
YEAST WET PREP: NONE SEEN

## 2013-04-28 LAB — URINE MICROSCOPIC-ADD ON

## 2013-04-28 LAB — CBC
HCT: 34.1 % — ABNORMAL LOW (ref 36.0–46.0)
Hemoglobin: 12.1 g/dL (ref 12.0–15.0)
MCH: 27.4 pg (ref 26.0–34.0)
MCHC: 35.5 g/dL (ref 30.0–36.0)
MCV: 77.1 fL — AB (ref 78.0–100.0)
PLATELETS: 213 10*3/uL (ref 150–400)
RBC: 4.42 MIL/uL (ref 3.87–5.11)
RDW: 13.2 % (ref 11.5–15.5)
WBC: 7.3 10*3/uL (ref 4.0–10.5)

## 2013-04-28 LAB — URINALYSIS, ROUTINE W REFLEX MICROSCOPIC
Bilirubin Urine: NEGATIVE
Glucose, UA: NEGATIVE mg/dL
HGB URINE DIPSTICK: NEGATIVE
Ketones, ur: NEGATIVE mg/dL
Nitrite: NEGATIVE
Protein, ur: NEGATIVE mg/dL
SPECIFIC GRAVITY, URINE: 1.025 (ref 1.005–1.030)
UROBILINOGEN UA: 0.2 mg/dL (ref 0.0–1.0)
pH: 6 (ref 5.0–8.0)

## 2013-04-28 LAB — HCG, QUANTITATIVE, PREGNANCY: HCG, BETA CHAIN, QUANT, S: 6997 m[IU]/mL — AB (ref <5)

## 2013-04-28 IMAGING — US US OB TRANSVAGINAL
1 series · 13 of 28 positions shown · non-contrast
Comparison: None.

CLINICAL DATA: Vaginal bleeding. Five weeks and 5 days pregnant by
last menstrual period. Quantitative beta HCG [DATE].

EXAM:
TRANSVAGINAL OB ULTRASOUND; OBSTETRIC <14 WK ULTRASOUND
TECHNIQUE: Transvaginal ultrasound was performed for complete evaluation of the
gestation as well as the maternal uterus, adnexal regions, and
pelvic cul-de-sac.

[Series 1: us ob transvaginal · 28 acquisitions, 13 frames shown]
[im 2/28]
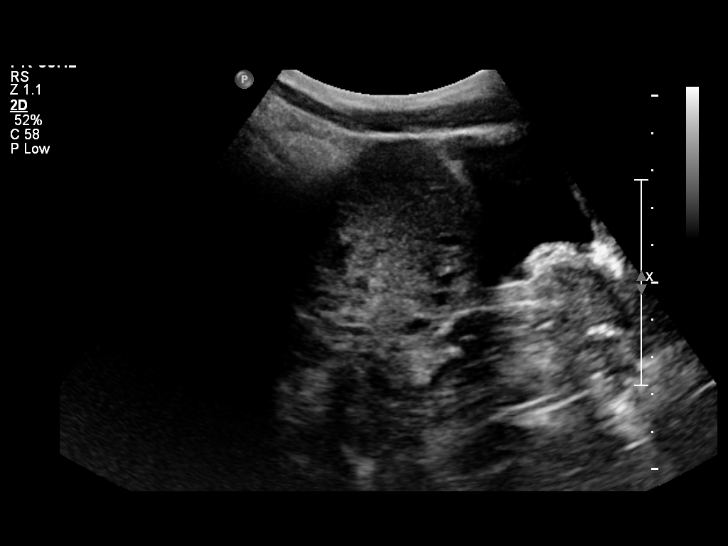
[im 4/28]
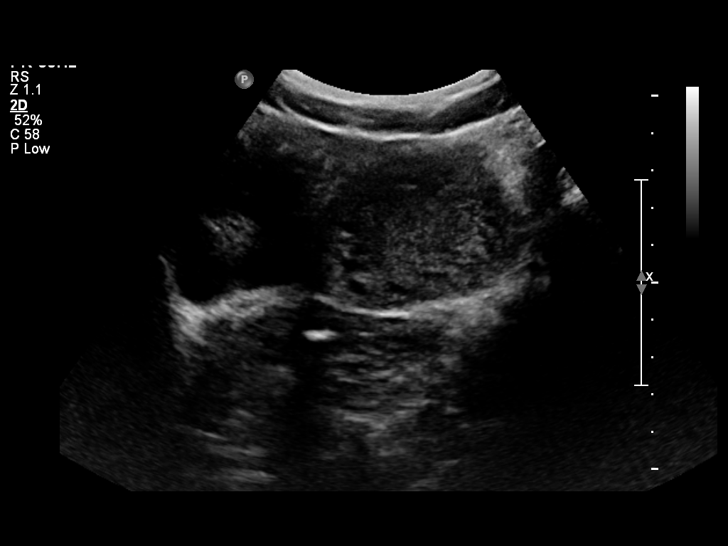
[im 6/28]
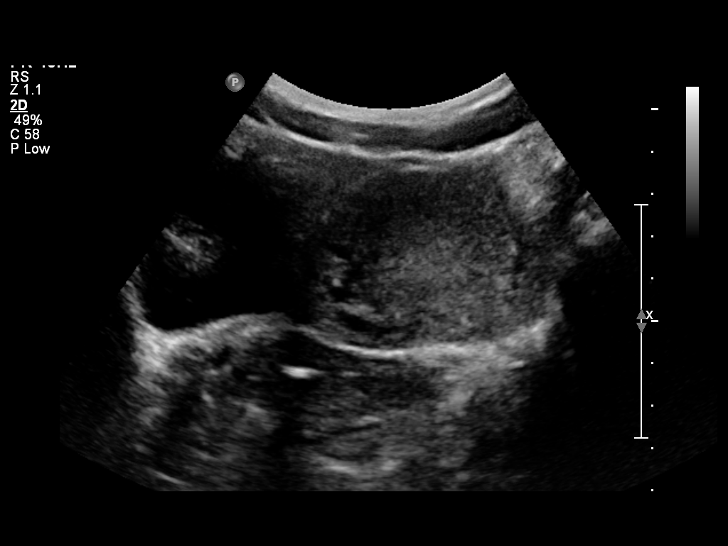
[im 8/28]
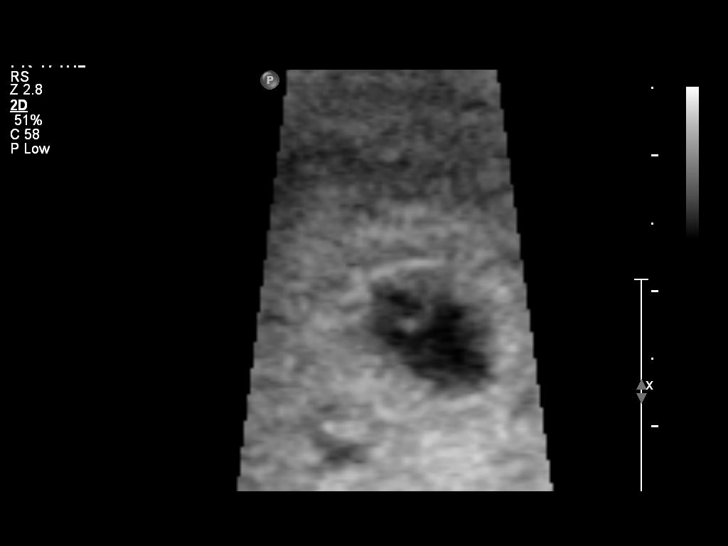
[im 10/28]
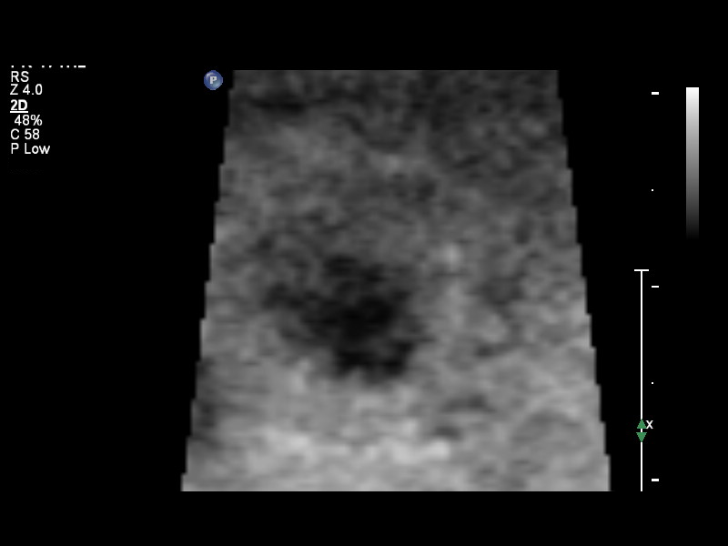
[im 12/28]
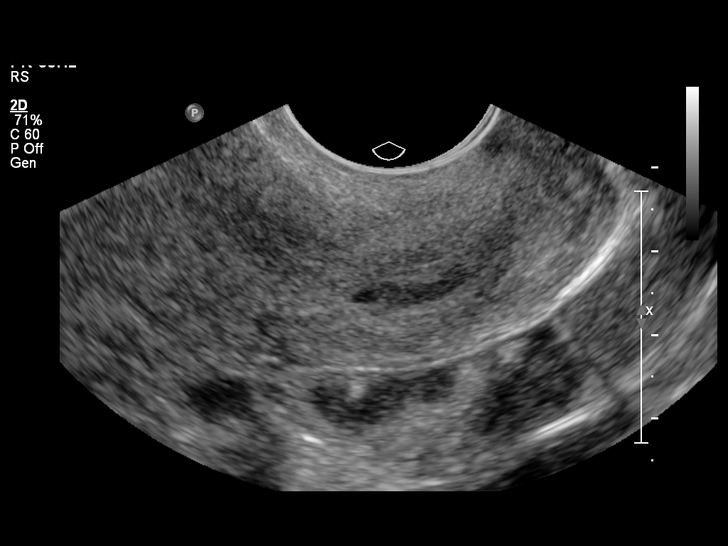
[im 15/28]
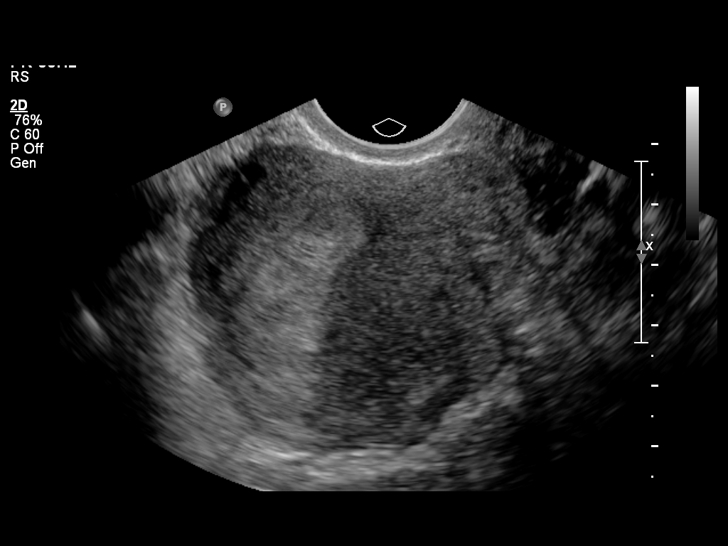
[im 17/28]
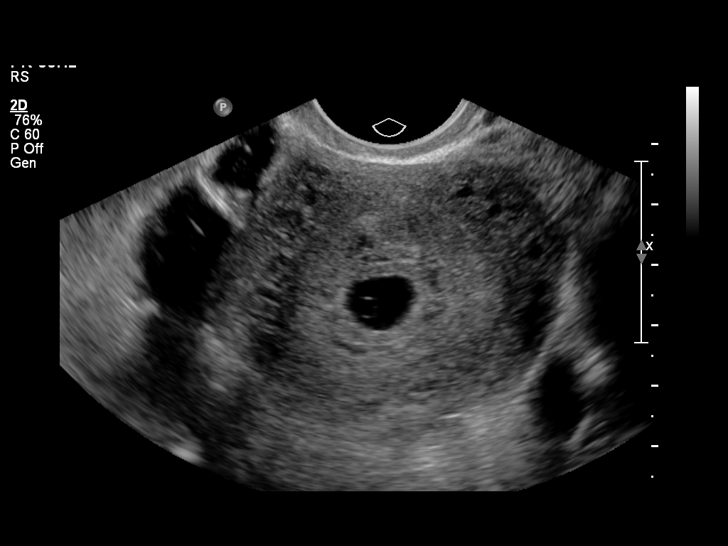
[im 19/28]
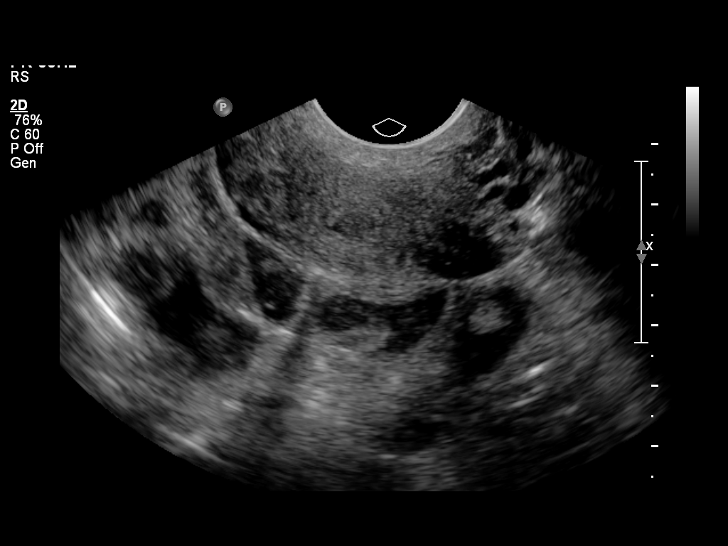
[im 21/28]
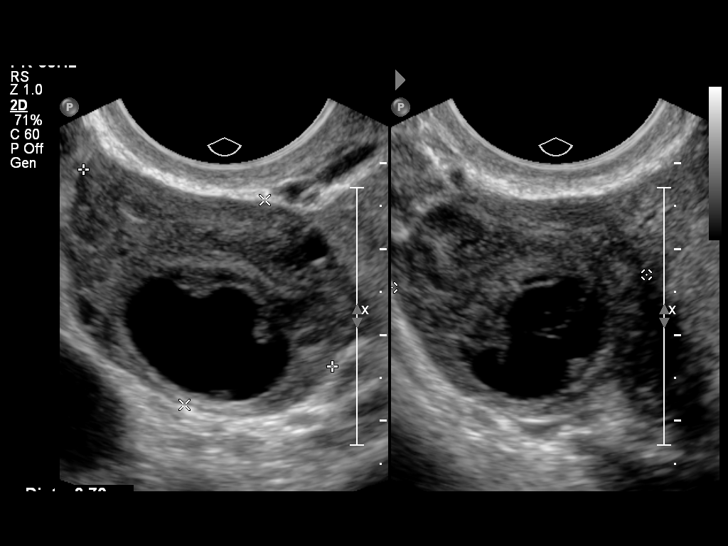
[im 23/28]
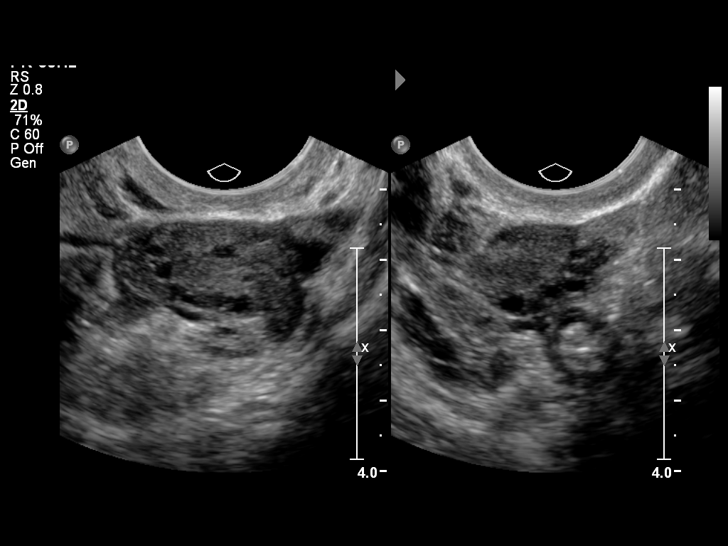
[im 25/28]
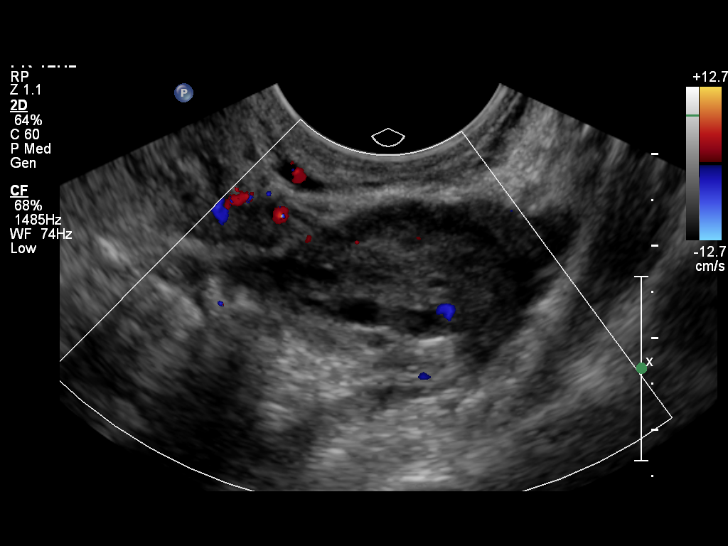
[im 27/28]
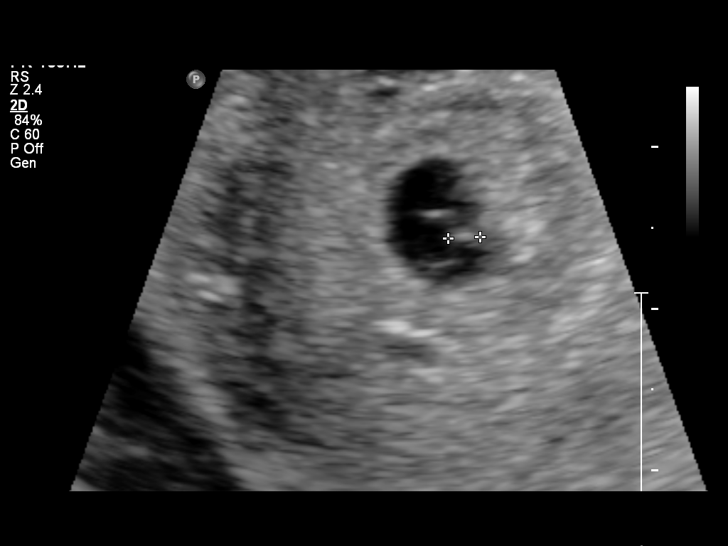

[13 of 28 positions shown; findings below may reference images not displayed]

FINDINGS: Intrauterine gestational sac: Visualized/normal in shape.

Yolk sac:  Visualized/normal in shape.

Embryo:  Visualized

Cardiac Activity: Not visualized

CRL:   2.0  mm   5 w 6 d                  US EDC: [DATE]

Maternal uterus/adnexae: Small subchorionic hemorrhage. Normal
appearing ovaries. No free peritoneal fluid.
IMPRESSION: 1. Single intrauterine gestation with an estimated gestational age
of 5 weeks and 6 days.
2. No cardiac activity seen. Findings are suspicious but not yet
definitive for failed pregnancy. A viable early pregnancy remains a
possibility. Recommend follow-up US in 10-14 days for definitive
diagnosis. This recommendation follows SRU consensus guidelines:
Diagnostic Criteria for Nonviable Pregnancy Early in the First
Trimester. N Engl J Med [D7]; [DATE].
3. Small subchorionic hemorrhage.

## 2013-04-28 NOTE — MAU Provider Note (Signed)
History     CSN: [id number]  Arrival date and time: 04/28/13 1556   First Provider Initiated Contact with Patient 04/28/13 1707      Chief Complaint  Patient presents with  . Possible Pregnancy  . Vaginal Bleeding   HPI  Tasha Wu is a 23 y.o. female G3P1011 at [redacted]w[redacted]d who presents following a positive home pregnancy test; pt is experiencing vaginal bleeding that started yesterday. Pt denies pain currently. The bleeding is described at very light; she notices it only when she wipes.    OB History   Grav Para Term Preterm Abortions TAB SAB Ect Mult Living   3 1 1  1     1       Past Medical History  Diagnosis Date  . Infection     Yeast;would get frequently  . Infection     BV x 1  . Kidney infection 2010    PO antibxs  . Migraines     no rx'd meds in the past;goes to sleep  . Umbilical hernia 1991    Does not cause anyp problems  . Emotional instability     Break down easily when things are not going her way  . Anxiety 2010    anxiety attack;no meds;has not been dx'd  . Hx of joint problems     Shoulders and knees have pain  . Abnormal Pap smear 12/10/2011  . GERD (gastroesophageal reflux disease)     Past Surgical History  Procedure Laterality Date  . Dilation and curettage of uterus  2009    No complications  . Wisdom tooth extraction      Family History  Problem Relation Age of Onset  . Sickle cell trait Mother   . Sickle cell trait Brother   . Sickle cell anemia Maternal Aunt     Recently deceased from [facility] crisis  . Sickle cell anemia Maternal Grandmother     Deceased  . Heart disease Maternal Aunt   . Asthma Sister     1/2 sibling;father's child  . Hyperthyroidism Paternal Grandmother   . Migraines Father   . Hernia Maternal Aunt     Umbilical  . Hypertension Mother   . Hypertension Maternal Uncle   . Anemia Mother   . Diabetes Maternal Aunt   . Diabetes type I Cousin     maternal  . Diabetes Paternal Grandfather   . Scoliosis  Paternal Grandmother   . Arthritis Paternal Grandmother   . Other Father     Joint Problems;shoulders and knees    History  Substance Use Topics  . Smoking status: Current Every Day Smoker -- 0.25 packs/day    Types: Cigarettes    Last Attempt to Quit: 11/18/2011  . Smokeless tobacco: Never Used  . Alcohol Use: No     Comment: Has stopped since pregnancy    Allergies: No Known Allergies  Prescriptions prior to admission  Medication Sig Dispense Refill  . ibuprofen (ADVIL,MOTRIN) 600 MG tablet Take 1 tablet (600 mg total) by mouth every 6 (six) hours.  30 tablet  1  . IRON PO Take 1 tablet by mouth daily.      Marland Kitchen omeprazole (PRILOSEC) 40 MG capsule Take 1 capsule (40 mg total) by mouth daily.  30 capsule  1   Results for orders placed during the hospital encounter of 04/28/13 (from the past 48 hour(s))  URINALYSIS, ROUTINE W REFLEX MICROSCOPIC     Status: Abnormal   Collection Time  04/28/13  4:20 PM      Result Value Ref Range   Color, Urine YELLOW  YELLOW   APPearance CLEAR  CLEAR   Specific Gravity, Urine 1.025  1.005 - 1.030   pH 6.0  5.0 - 8.0   Glucose, UA NEGATIVE  NEGATIVE mg/dL   Hgb urine dipstick NEGATIVE  NEGATIVE   Bilirubin Urine NEGATIVE  NEGATIVE   Ketones, ur NEGATIVE  NEGATIVE mg/dL   Protein, ur NEGATIVE  NEGATIVE mg/dL   Urobilinogen, UA 0.2  0.0 - 1.0 mg/dL   Nitrite NEGATIVE  NEGATIVE   Leukocytes, UA SMALL (*) NEGATIVE  URINE MICROSCOPIC-ADD ON     Status: None   Collection Time    04/28/13  4:20 PM      Result Value Ref Range   Squamous Epithelial / LPF RARE  RARE   WBC, UA 3-6  <3 WBC/hpf   RBC / HPF 0-2  <3 RBC/hpf   Bacteria, UA RARE  RARE   Urine-Other MUCOUS PRESENT    CBC     Status: Abnormal   Collection Time    04/28/13  4:54 PM      Result Value Ref Range   WBC 7.3  4.0 - 10.5 K/uL   RBC 4.42  3.87 - 5.11 MIL/uL   Hemoglobin 12.1  12.0 - 15.0 g/dL   HCT 45.4 (*) 09.8 - 11.9 %   MCV 77.1 (*) 78.0 - 100.0 fL   MCH 27.4  26.0 -  34.0 pg   MCHC 35.5  30.0 - 36.0 g/dL   RDW 14.7  82.9 - 56.2 %   Platelets 213  150 - 400 K/uL  HCG, QUANTITATIVE, PREGNANCY     Status: Abnormal   Collection Time    04/28/13  4:54 PM      Result Value Ref Range   hCG, Beta Chain, Quant, S 6997 (*) <5 mIU/mL   Comment:              GEST. AGE      CONC.  (mIU/mL)       <=1 WEEK        5 - 50         2 WEEKS       50 - 500         3 WEEKS       100 - 10,000         4 WEEKS     1,000 - 30,000         5 WEEKS     3,500 - 115,000       6-8 WEEKS     12,000 - 270,000        12 WEEKS     15,000 - 220,000                FEMALE AND NON-PREGNANT FEMALE:         LESS THAN 5 mIU/mL  WET PREP, GENITAL     Status: Abnormal   Collection Time    04/28/13  5:30 PM      Result Value Ref Range   Yeast Wet Prep HPF POC NONE SEEN  NONE SEEN   Trich, Wet Prep NONE SEEN  NONE SEEN   Clue Cells Wet Prep HPF POC FEW (*) NONE SEEN   WBC, Wet Prep HPF POC MODERATE (*) NONE SEEN   Comment: MANY BACTERIA SEEN    Review of Systems  Gastrointestinal: Positive for abdominal pain (+ minor  bilateral abdominal cramping ). Negative for nausea, vomiting, diarrhea and constipation.  Genitourinary: Negative for dysuria, urgency, frequency, hematuria and flank pain.       No vaginal discharge. + vaginal bleeding; light No dysuria.   Musculoskeletal: Negative for back pain.   Physical Exam   Blood pressure 104/65, pulse 72, temperature 98.5 F (36.9 C), temperature source Oral, resp. rate 16, height 5\' 5"  (1.651 m), weight 52.617 kg (116 lb), last menstrual period 03/19/2013.  Physical Exam  Constitutional: She is oriented to person, place, and time. She appears well-developed and well-nourished. No distress.  HENT:  Head: Normocephalic.  Eyes: Pupils are equal, round, and reactive to light.  Neck: Neck supple.  Respiratory: Effort normal.  GI: Soft. She exhibits no distension and no mass. There is no tenderness. There is no rebound and no guarding.   Genitourinary:  Speculum exam: Vagina - Small amount of creamy, brown discharge, no odor Cervix - No contact bleeding, no active bleeding  Bimanual exam: Cervix closed, No CMT  Uterus non tender, normal size Adnexa non tender, no masses bilaterally GC/Chlam, wet prep done Chaperone present for exam.   Musculoskeletal: Normal range of motion.  Neurological: She is alert and oriented to person, place, and time.  Skin: Skin is warm. She is not diaphoretic.  Psychiatric: Her behavior is normal.    MAU Course  Procedures None  MDM UA UPT positive CBC ABO/RH Quant  US O positive blood type   Assessment and Plan   A:  1. Normal IUP (intrauterine pregnancy) on prenatal ultrasound   2. Subchorionic hematoma in first trimester   3. Vaginal bleeding in pregnancy     P;  Discharge home in stable condition Return to MAU as needed, if symptoms worsen Pelvic rest Bleeding precautions discussed Start prenatal care as soon as possible   Debbrah AlarJennifer Irene Dashun Borre, NP  04/28/2013, 7:34 PM

## 2013-04-28 NOTE — MAU Note (Signed)
Last Thurs, 2 +HPT. Monday started spotting.

## 2013-04-29 NOTE — MAU Provider Note (Signed)
Attestation of Attending Supervision of Advanced Practitioner (PA/CNM/NP): Evaluation and management procedures were performed by the Advanced Practitioner under my supervision and collaboration.  I have reviewed the Advanced Practitioner's note and chart, and I agree with the management and plan.  Ceniyah Thorp S, MD [facility] Attending 04/29/2013 6:46 AM   

## 2013-05-01 LAB — GC/CHLAMYDIA PROBE AMP
CT PROBE, AMP APTIMA: NEGATIVE
GC PROBE AMP APTIMA: NEGATIVE

## 2013-05-11 ENCOUNTER — Other Ambulatory Visit (HOSPITAL_COMMUNITY): Payer: Self-pay | Admitting: Nurse Practitioner

## 2013-05-11 DIAGNOSIS — IMO0002 Reserved for concepts with insufficient information to code with codable children: Secondary | ICD-10-CM

## 2013-05-11 DIAGNOSIS — Z09 Encounter for follow-up examination after completed treatment for conditions other than malignant neoplasm: Secondary | ICD-10-CM

## 2013-05-15 ENCOUNTER — Ambulatory Visit (HOSPITAL_COMMUNITY)
Admission: RE | Admit: 2013-05-15 | Discharge: 2013-05-15 | Disposition: A | Discharge status: Home / Self Care | Payer: Medicaid Other | Source: Ambulatory Visit | Attending: Nurse Practitioner | Admitting: Nurse Practitioner

## 2013-05-15 ENCOUNTER — Other Ambulatory Visit (HOSPITAL_COMMUNITY): Payer: Self-pay | Admitting: Nurse Practitioner

## 2013-05-15 DIAGNOSIS — IMO0002 Reserved for concepts with insufficient information to code with codable children: Secondary | ICD-10-CM

## 2013-05-15 DIAGNOSIS — Z3689 Encounter for other specified antenatal screening: Secondary | ICD-10-CM | POA: Insufficient documentation

## 2013-05-15 DIAGNOSIS — Z09 Encounter for follow-up examination after completed treatment for conditions other than malignant neoplasm: Secondary | ICD-10-CM

## 2013-05-15 IMAGING — US US OB COMP LESS 14 WK
1 series · 14 of 28 positions shown · non-contrast
Comparison: US OB TRANSVAGINAL dated [DATE]

CLINICAL DATA: Positive pregnancy test, verify date and pregnancy
progression from prior exam

EXAM:
OBSTETRIC <14 WK ULTRASOUND
TECHNIQUE: Transabdominal ultrasound was performed for evaluation of the
gestation as well as the maternal uterus and adnexal regions.

[Series 1: us ob transvaginal · 14 of 28 slices shown]
[im 2/28]
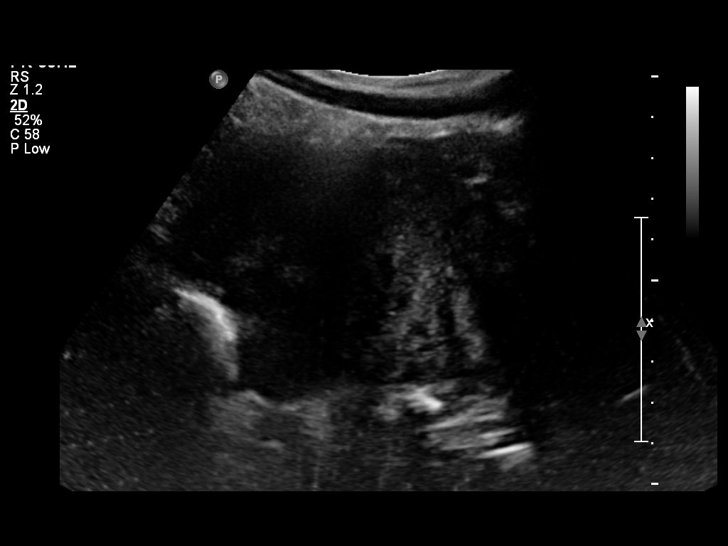
[im 4/28]
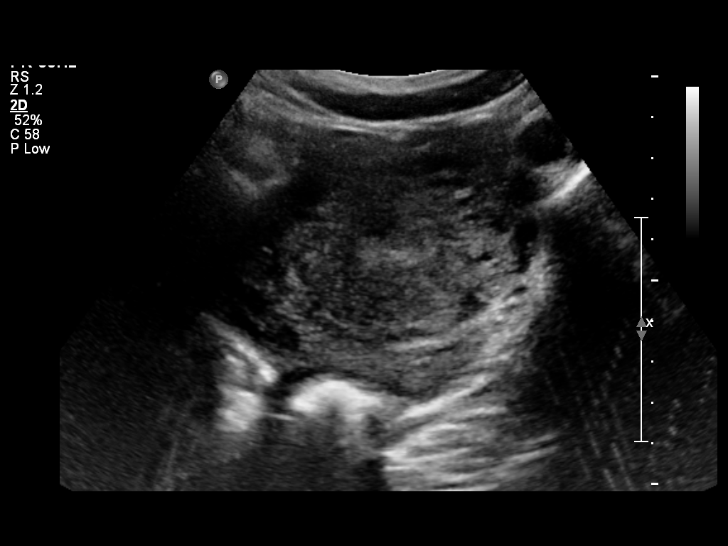
[im 6/28]
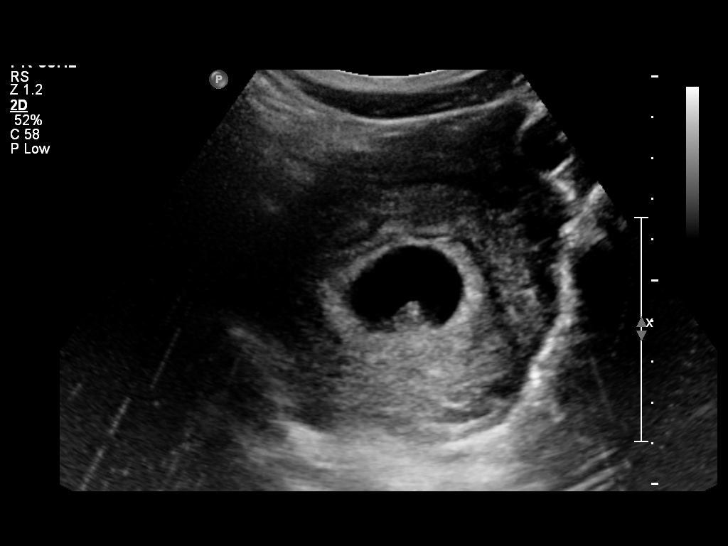
[im 8/28]
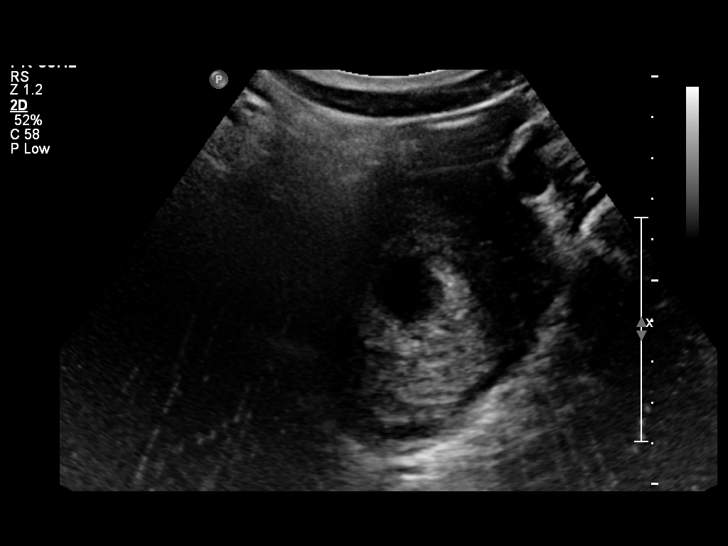
[im 10/28]
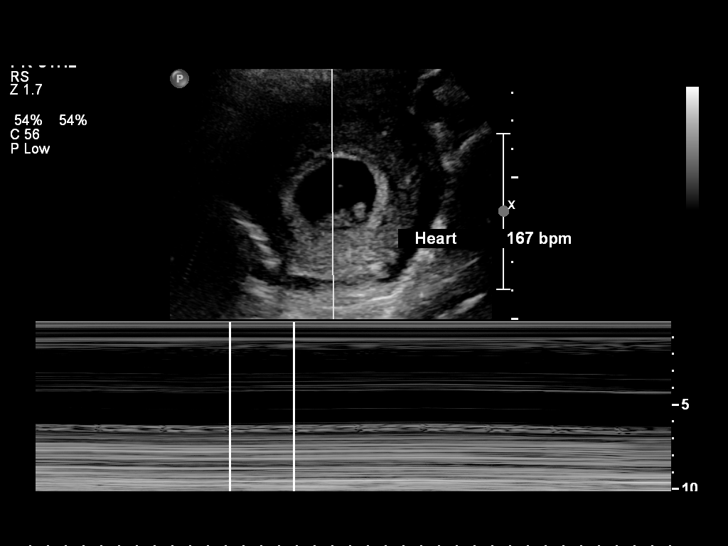
[im 12/28]
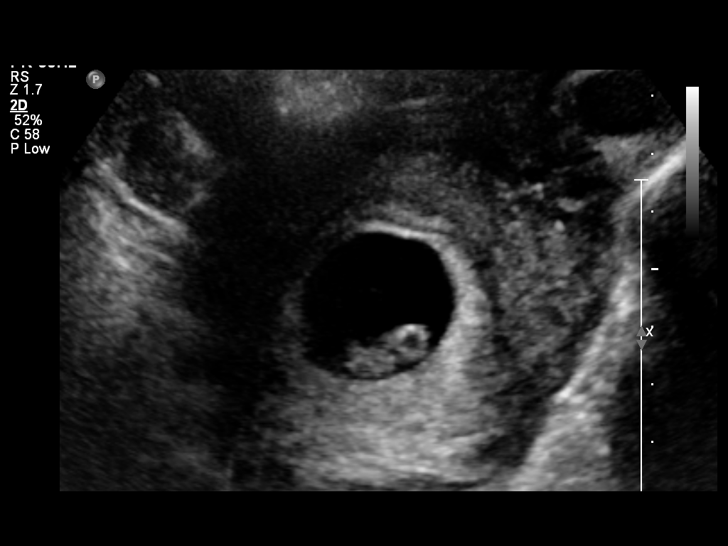
[im 14/28]
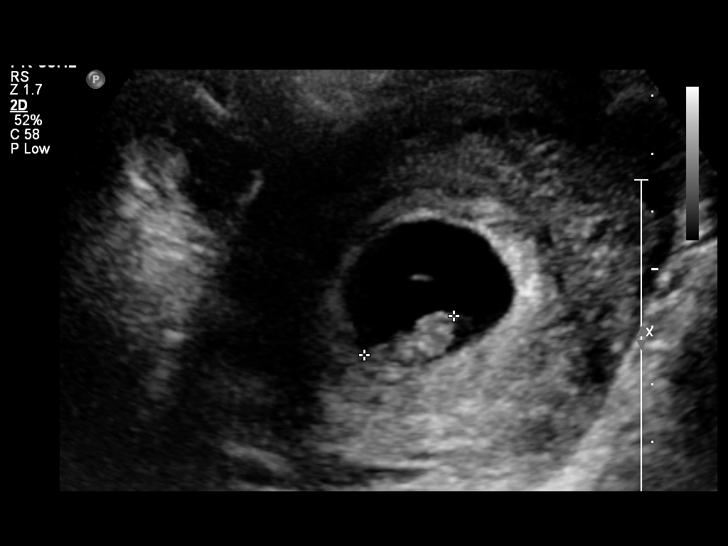
[im 16/28]
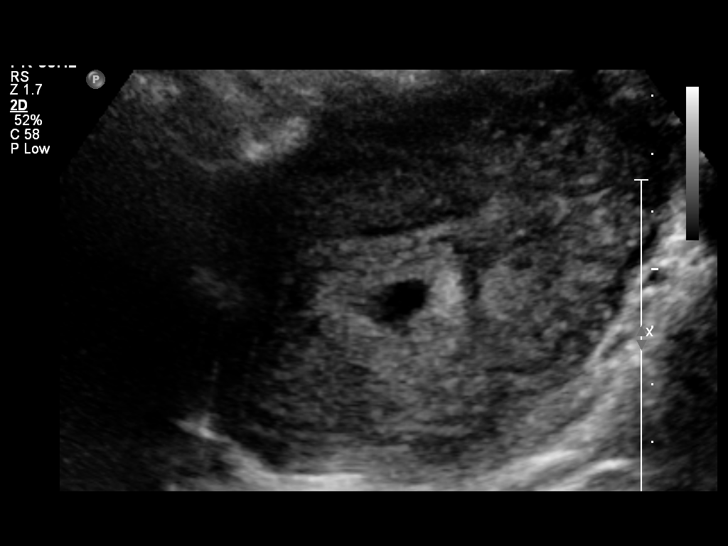
[im 18/28]
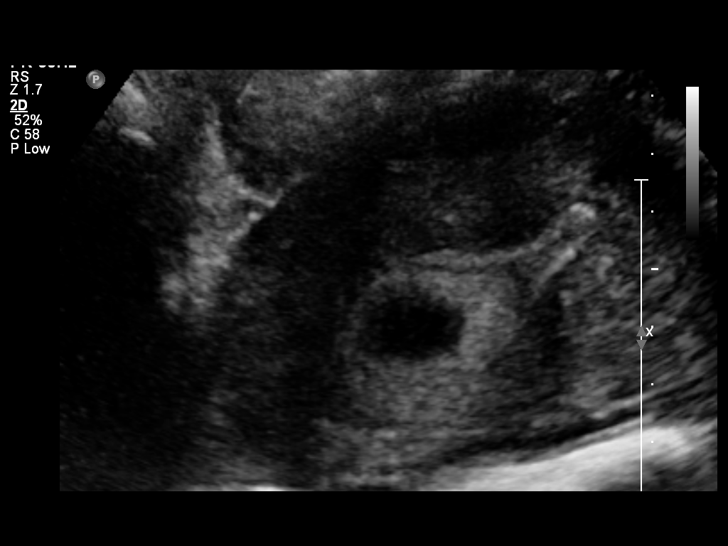
[im 20/28]
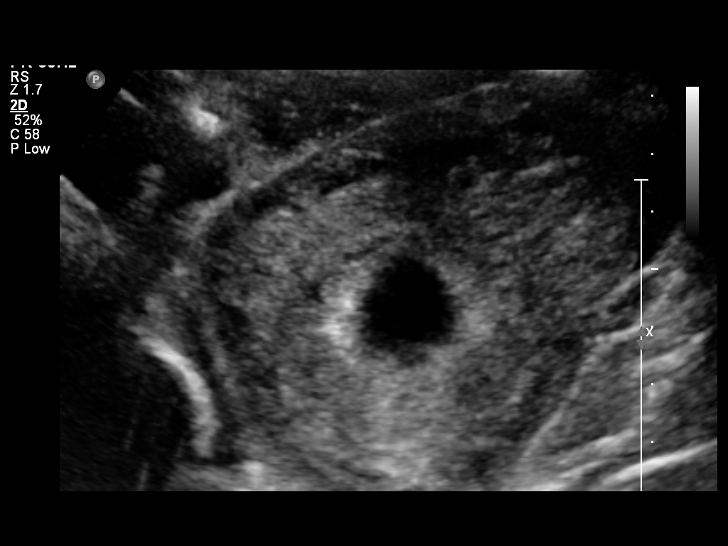
[im 22/28]
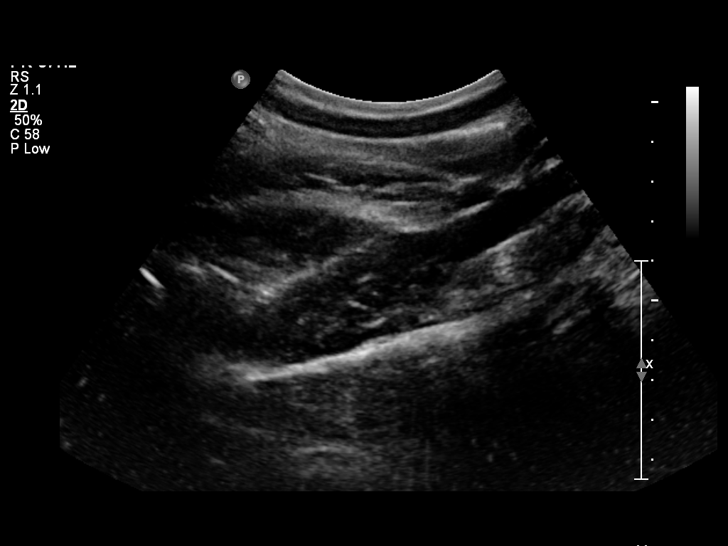
[im 24/28]
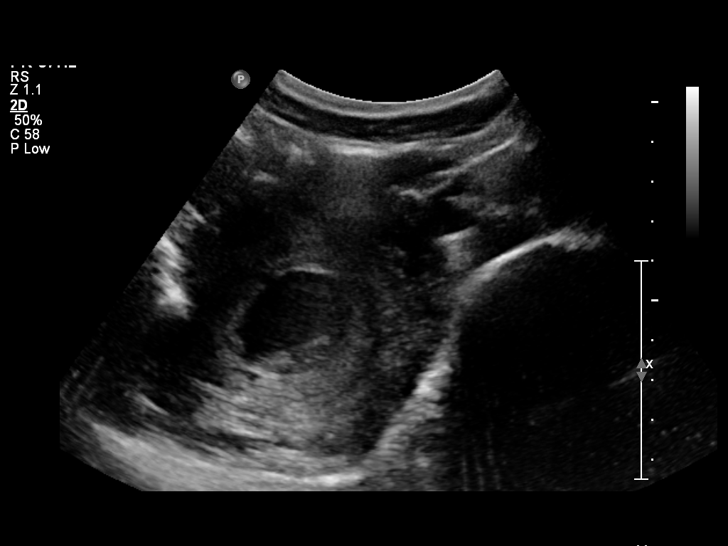
[im 26/28]
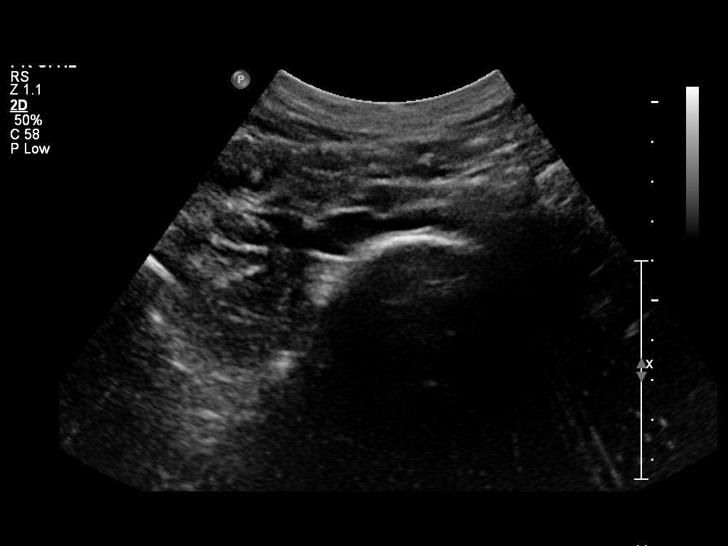
[im 28/28]
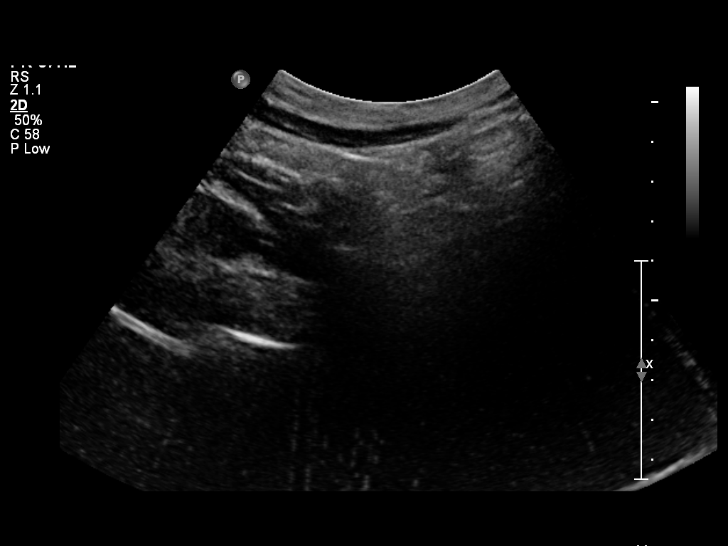

[14 of 28 positions shown; findings below may reference images not displayed]

FINDINGS: Intrauterine gestational sac: Visualized/normal in shape.

Yolk sac:  Visualized

Embryo:  Visualized

Cardiac Activity: Visualized

Heart Rate: 167 bpm

CRL:   16  mm   8 w 1d                  US EDC: [DATE]

Maternal uterus/adnexae: Ovaries not visualized.  No adnexal mass.
IMPRESSION: Single live intrauterine gestation measuring 8 weeks 1 day by
crown-rump length today.

## 2013-07-10 ENCOUNTER — Other Ambulatory Visit (HOSPITAL_COMMUNITY): Payer: Self-pay | Admitting: Nurse Practitioner

## 2013-07-10 DIAGNOSIS — Z3689 Encounter for other specified antenatal screening: Secondary | ICD-10-CM

## 2013-08-06 ENCOUNTER — Ambulatory Visit (HOSPITAL_COMMUNITY)
Admission: RE | Admit: 2013-08-06 | Discharge: 2013-08-06 | Disposition: A | Discharge status: Home / Self Care | Payer: Medicaid Other | Source: Ambulatory Visit | Attending: Nurse Practitioner | Admitting: Nurse Practitioner

## 2013-08-06 DIAGNOSIS — Z3689 Encounter for other specified antenatal screening: Secondary | ICD-10-CM | POA: Insufficient documentation

## 2013-08-06 IMAGING — US US OB COMP +14 WK
1 series · 12 of 28 positions shown · non-contrast
Comparison: none

[Series 1: us ob comp +14 wk · 12 of 77 slices shown]
[im 3/77]
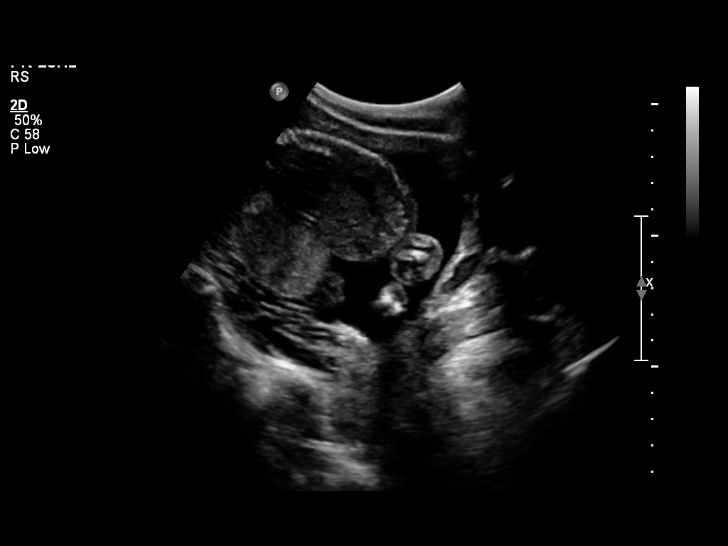
[im 9/77]
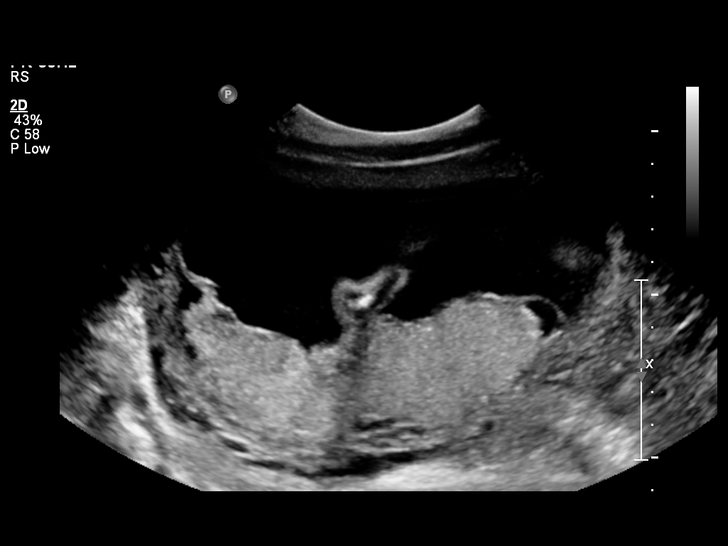
[im 15/77]
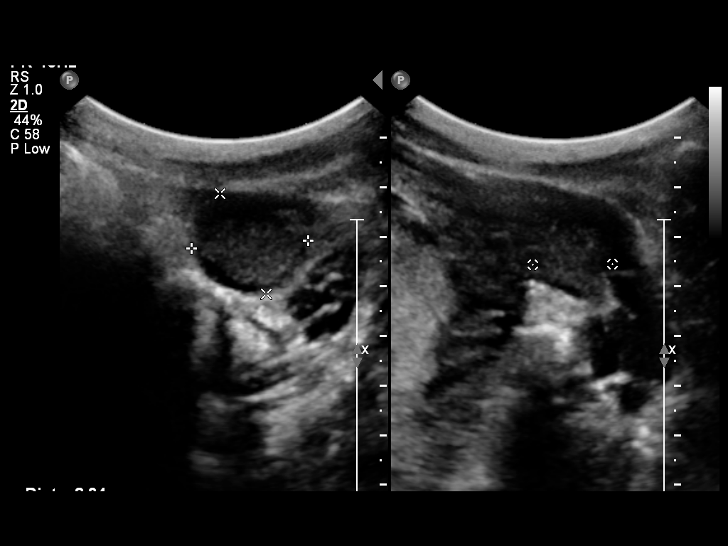
[im 23/77]
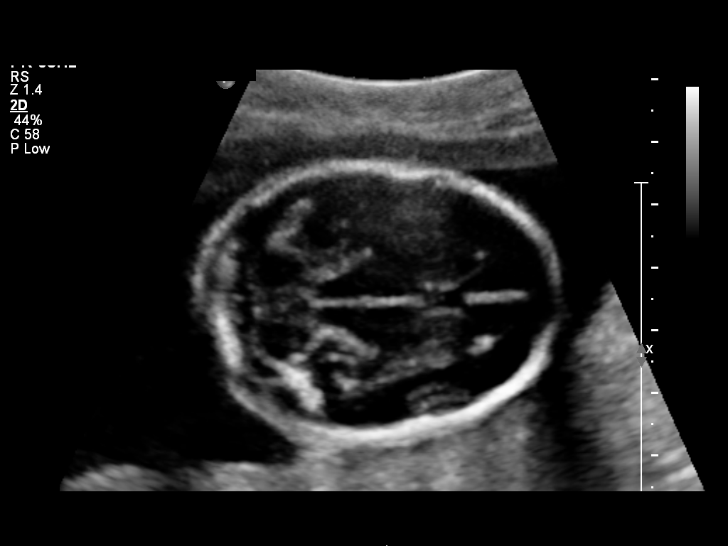
[im 29/77]
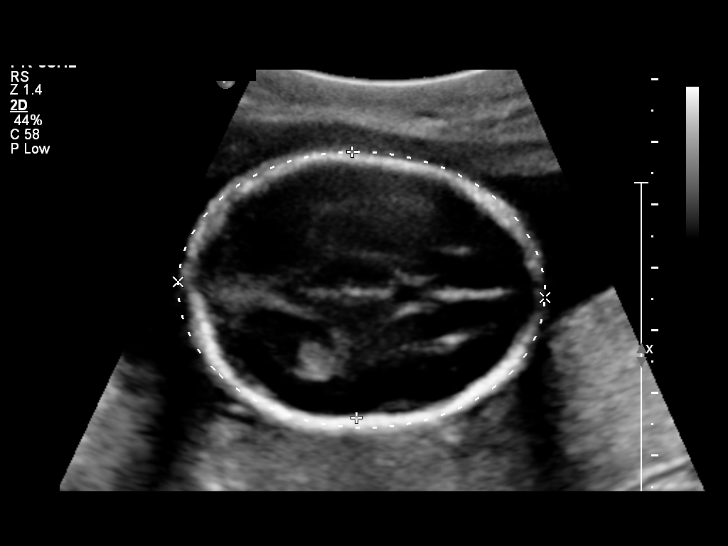
[im 34/77]
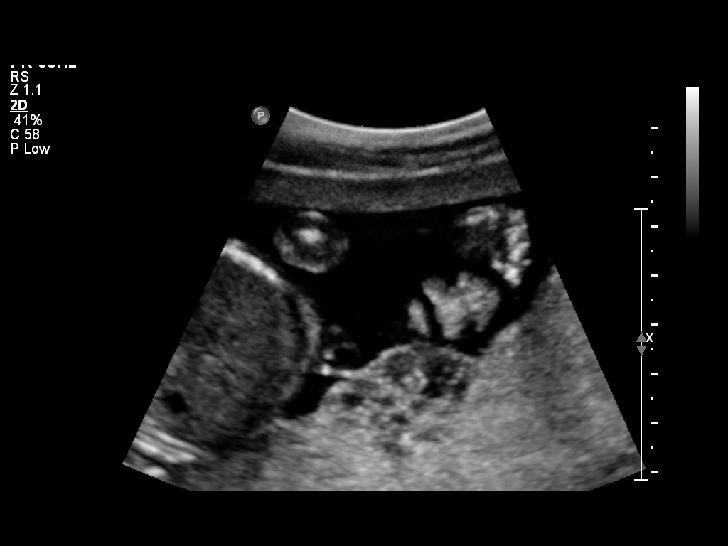
[im 43/77]
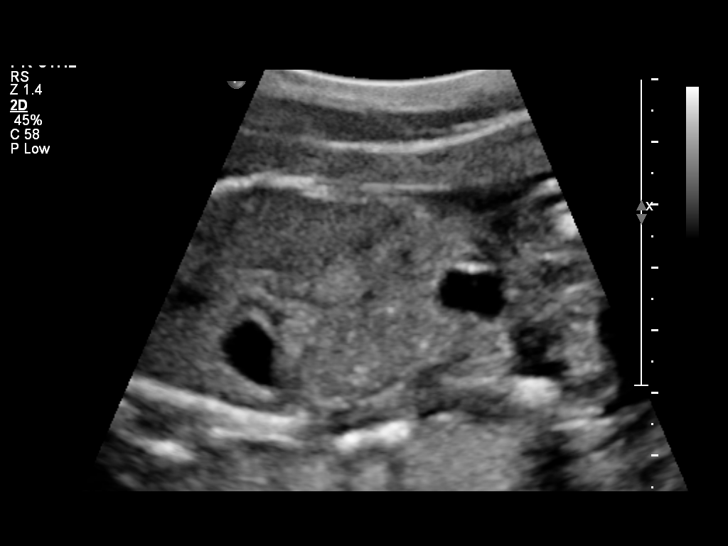
[im 48/77]
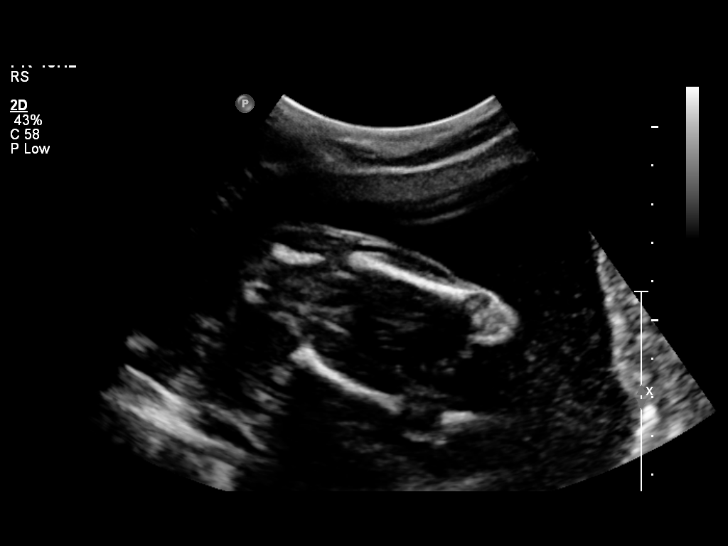
[im 54/77]
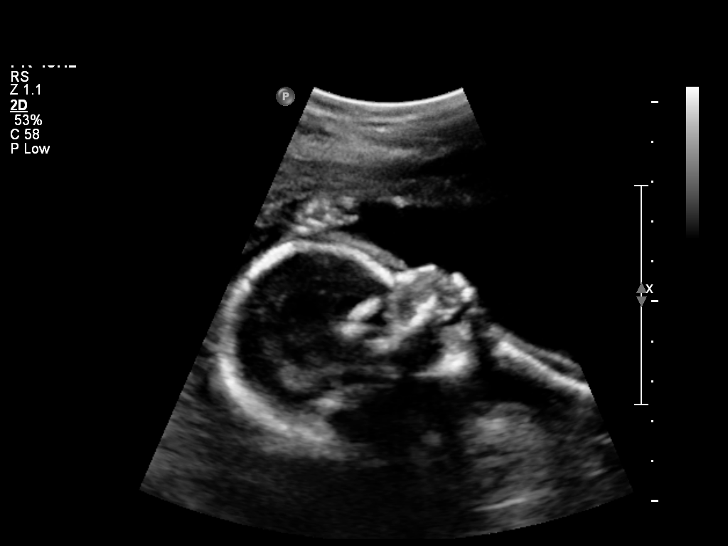
[im 62/77]
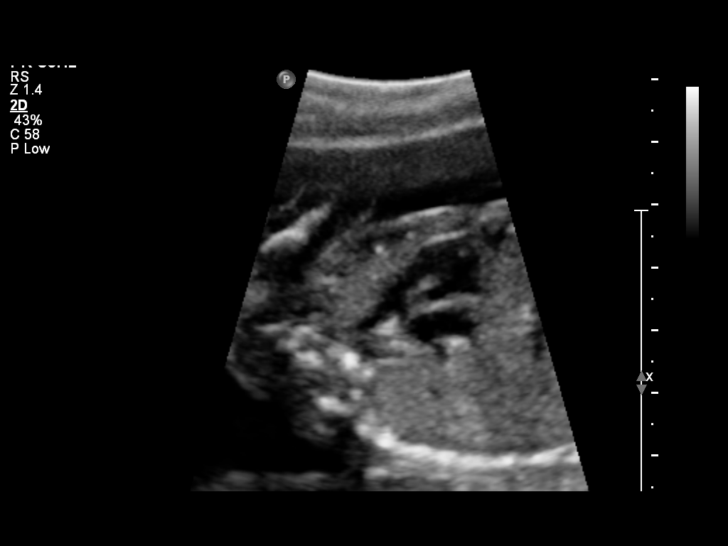
[im 68/77]
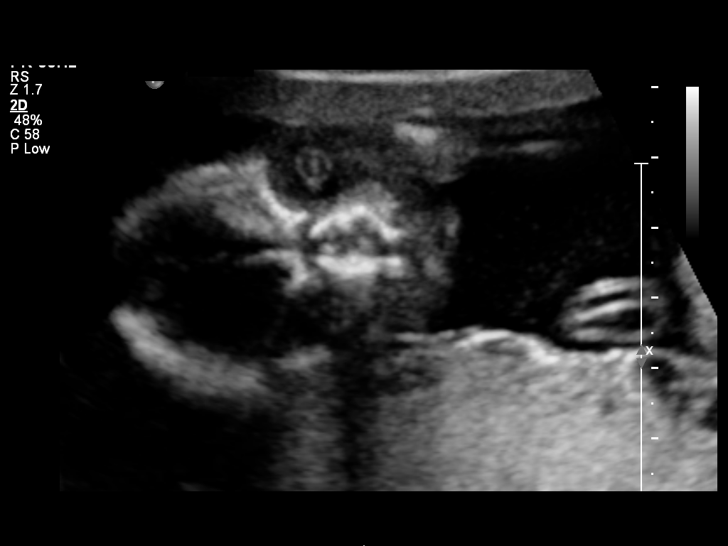
[im 74/77]
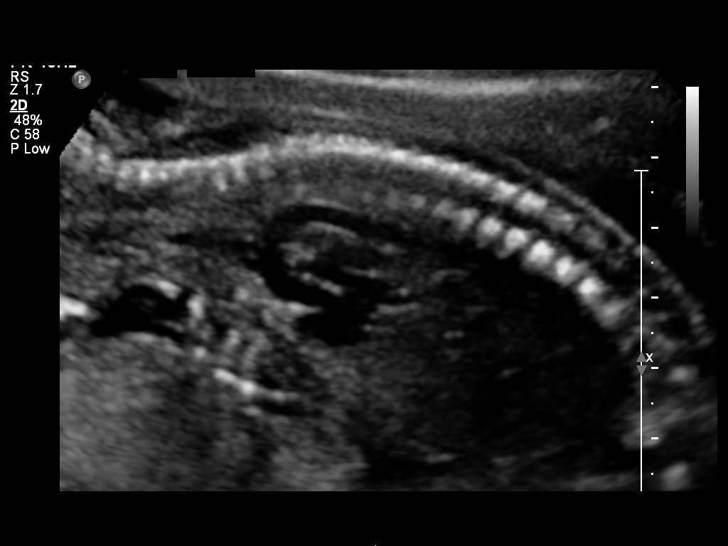

[12 of 28 positions shown; findings below may reference images not displayed]

OBSTETRICS REPORT
                      (Signed Final [DATE] [DATE])

Service(s) Provided

 US OB COMP + 14 WK                                    76805.1
Indications

 Basic anatomic survey                                 [YN]
Fetal Evaluation

 Num Of Fetuses:    1
 Fetal Heart Rate:  152                          bpm
 Cardiac Activity:  Observed
 Presentation:      Breech, footling
 Placenta:          Posterior, above cervical
                    os
 P. Cord            Visualized, central
 Insertion:

 Amniotic Fluid
 AFI FV:      Subjectively within normal limits
                                             Larg Pckt:    4.65  cm
Biometry

 BPD:     42.1  mm     G. Age:  18w 5d                CI:        68.63   70 - 86
                                                      FL/HC:      19.4   16.8 -

 HC:     162.4  mm     G. Age:  19w 0d        8  %    HC/AC:      1.14   1.09 -

 AC:       142  mm     G. Age:  19w 4d       30  %    FL/BPD:
 FL:      31.5  mm     G. Age:  19w 6d       35  %    FL/AC:      22.2   20 - 24
 HUM:     29.4  mm     G. Age:  19w 4d       43  %
 CER:     19.2  mm     G. Age:  18w 4d       10  %
 NFT:     3.15  mm

 Est. FW:     300  gm    0 lb 11 oz      41  %
Gestational Age

 LMP:           20w 0d        Date:  [DATE]                 EDD:   [DATE]
 U/S Today:     19w 2d                                        EDD:   [DATE]
 Best:          20w 0d     Det. By:  LMP  ([DATE])          EDD:   [DATE]
Anatomy
 Cranium:          Appears normal         Aortic Arch:      Appears normal
 Fetal Cavum:      Appears normal         Diaphragm:        Appears normal
 Ventricles:       Appears normal         Stomach:          Appears normal, left
                                                            sided
 Choroid Plexus:   Appears normal         Abdomen:          Appears normal
 Cerebellum:       Appears normal         Abdominal Wall:   Appears nml (cord
                                                            insert, abd wall)
 Posterior Fossa:  Appears normal         Cord Vessels:     Appears normal (3
                                                            vessel cord)
 Nuchal Fold:      Appears normal         Kidneys:          Appear normal
 Face:             Appears normal         Bladder:          Appears normal
                   (orbits and profile)
 Lips:             Appears normal         Spine:            Appears normal
 Heart:            Appears normal         Lower             Appears normal
                   (4CH, axis, and        Extremities:
                   situs)
 RVOT:             Appears normal         Upper             Appears normal
                                          Extremities:
 LVOT:             Appears normal

 Other:  Fetus appears to be a female. Heels and 5th digit visualized. Nasal
         bone visualized. Technically difficult due to fetal position.
Targeted Anatomy

 Fetal Central Nervous System
 Cisterna Magna:
Cervix Uterus Adnexa

 Cervical Length:    3.35     cm

 Cervix:       Normal appearance by transabdominal scan.
 Uterus:       No abnormality visualized.
 Cul De Sac:   No free fluid seen.

 Left Ovary:    Within normal limits.
 Right Ovary:   Not visualized.
 Adnexa:     No abnormality visualized.
Comments

 The patient's fetal anatomic survey is now complete.  No fetal
 anomalies or soft markers of aneuploidy were seen.
Impression

 Single living intrauterine pregnancy at 20 weeks 0 days.
 Appropriate fetal growth (41%).
 Normal amniotic fluid volume.
 Normal fetal anatomy.
 No fetal anomalies or soft markers of aneuploidy seen.
Recommendations

 Follow-up ultrasounds as clinically indicated.

                JADHAV

## 2013-10-08 ENCOUNTER — Inpatient Hospital Stay (HOSPITAL_COMMUNITY)
Admission: AD | Admit: 2013-10-08 | Discharge: 2013-10-09 | Disposition: A | Discharge status: Home / Self Care | Payer: Medicaid Other | Source: Ambulatory Visit | Attending: Obstetrics & Gynecology | Admitting: Obstetrics & Gynecology

## 2013-10-08 ENCOUNTER — Encounter (HOSPITAL_COMMUNITY): Payer: Self-pay

## 2013-10-08 DIAGNOSIS — K219 Gastro-esophageal reflux disease without esophagitis: Secondary | ICD-10-CM | POA: Insufficient documentation

## 2013-10-08 DIAGNOSIS — O99330 Smoking (tobacco) complicating pregnancy, unspecified trimester: Secondary | ICD-10-CM | POA: Insufficient documentation

## 2013-10-08 DIAGNOSIS — R1031 Right lower quadrant pain: Secondary | ICD-10-CM | POA: Insufficient documentation

## 2013-10-08 DIAGNOSIS — O99891 Other specified diseases and conditions complicating pregnancy: Secondary | ICD-10-CM | POA: Insufficient documentation

## 2013-10-08 DIAGNOSIS — O9989 Other specified diseases and conditions complicating pregnancy, childbirth and the puerperium: Principal | ICD-10-CM

## 2013-10-08 DIAGNOSIS — N200 Calculus of kidney: Secondary | ICD-10-CM

## 2013-10-08 MED ORDER — HYDROMORPHONE HCL PF 1 MG/ML IJ SOLN
1.0000 mg | Freq: Once | INTRAMUSCULAR | Status: AC
  Administered 2013-10-08: 1 mg via INTRAMUSCULAR
  Filled 2013-10-08: qty 1

## 2013-10-08 NOTE — MAU Note (Signed)
Pt states had bad right sided abdominal pain, went to the bathroom & blood clot came out; states when she pulls on the blood clot it feels like it is "stuck" to something inside of her. Denies complications with the pregnancy. Goes to the health dept for prenatal care.

## 2013-10-08 NOTE — MAU Provider Note (Signed)
History     CSN: [id number]  Arrival date and time: 10/08/13 2317   First Provider Initiated Contact with Patient 10/08/13 2346      Chief Complaint  Patient presents with  . Contractions  . Vaginal Bleeding   HPI  23 year old G3P1011 at [redacted]w[redacted]d gestation who presents with complaint of severe right flank and RLQ pain. Pain began several hours ago, started in the right flank and was 8/10 severity. No fevers/chills/shakes, no nausea/vomiting/diarrhea/constipation. She went to urinate and afterwards had a large string of clot that came from the vagina. No vaginal bleeding since then. Pain is worse with change in position. No history of kidney stone. No pain with urination. No leakage of fluid. She has positive fetal movement.    Past Medical History  Diagnosis Date  . Infection     Yeast;would get frequently  . Infection     BV x 1  . Kidney infection 2010    PO antibxs  . Migraines     no rx'd meds in the past;goes to sleep  . Umbilical hernia 1991    Does not cause anyp problems  . Emotional instability     Break down easily when things are not going her way  . Anxiety 2010    anxiety attack;no meds;has not been dx'd  . Hx of joint problems     Shoulders and knees have pain  . Abnormal Pap smear 12/10/2011  . GERD (gastroesophageal reflux disease)     Past Surgical History  Procedure Laterality Date  . Dilation and curettage of uterus  2009    No complications  . Wisdom tooth extraction      Family History  Problem Relation Age of Onset  . Sickle cell trait Mother   . Sickle cell trait Brother   . Sickle cell anemia Maternal Aunt     Recently deceased from [facility]  . Sickle cell anemia Maternal Grandmother     Deceased  . Heart disease Maternal Aunt   . Asthma Sister     1/2 sibling;father's child  . Hyperthyroidism Paternal Grandmother   . Migraines Father   . Hernia Maternal Aunt     Umbilical  . Hypertension Mother   . Hypertension Maternal Uncle   .  Anemia Mother   . Diabetes Maternal Aunt   . Diabetes type I Cousin     maternal  . Diabetes Paternal Grandfather   . Scoliosis Paternal Grandmother   . Arthritis Paternal Grandmother   . Other Father     Joint Problems;shoulders and knees    History  Substance Use Topics  . Smoking status: Current Every Day Smoker -- 0.25 packs/day    Types: Cigarettes    Last Attempt to Quit: 11/18/2011  . Smokeless tobacco: Never Used  . Alcohol Use: No     Comment: Has stopped since pregnancy    Allergies: No Known Allergies  Prescriptions prior to admission  Medication Sig Dispense Refill  . Prenatal Vit-Fe Fumarate-FA (PRENATAL MULTIVITAMIN) TABS tablet Take 1 tablet by mouth daily at 12 noon.      Marland Kitchen acetaminophen (TYLENOL) 325 MG tablet Take 650 mg by mouth every 6 (six) hours as needed for headache.        Review of Systems  Constitutional: Negative for fever and chills.  HENT: Negative for hearing loss.   Eyes: Negative for blurred vision and double vision.  Respiratory: Negative for cough and shortness of breath.   Cardiovascular: Negative for  chest pain and palpitations.  Gastrointestinal: Positive for abdominal pain. Negative for heartburn, nausea, vomiting, diarrhea and constipation.  Genitourinary: Positive for flank pain. Negative for dysuria and urgency.  Musculoskeletal: Negative for myalgias.  Skin: Negative for itching and rash.  Neurological: Negative for dizziness, tingling and headaches.   Physical Exam   Blood pressure 105/57, pulse 90, resp. rate 18, height 5\' 7"  (1.702 m), weight 57.607 kg (127 lb), last menstrual period 03/19/2013.  Physical Exam  Constitutional: She is oriented to person, place, and time. She appears well-developed and well-nourished.  23 year old female lying in bed, curled up appears in severe pain. Able to talk.   Cardiovascular: Normal rate and regular rhythm.   Respiratory: Effort normal and breath sounds normal.  GI: Soft.  Tender to  palpation in the RUQ/RLQ, involuntary guarding, no rebound.  Neurological: She is alert and oriented to person, place, and time.  Skin: Skin is warm and dry.    MAU Course  Procedures  MDM Complete abdominal ultrasound, evaluate for kidney stone/abruption, check urinalysis, CBC, CMP  Assessment and Plan  CBC, CMP unremarkable. Patient passed several small clots with urination. Urine sample was gross blood and could not be processed. No signs of UTI on exam, likely unable to get appropriate sample. OB ultrasound shows no signs of abruption, no bleeding coming from cervical os. Strongly favors diagnosis of renal stone. Patient symptomatically better with IV fluids and dilaudid. She does not feel her pain like it was before and this is 4 hours after receiving opiates. We discussed discharge vs. Admission and she elected to go home with pain meds. Will discharge home with a small course of narcotics to last 48 hours. Encouraged to return for fevers, pain not controlled with medication, unable to void. Patient expressed understanding of plan. Followup on Monday with prenatal provider.  Patient evaluated with Lindaann SloughFrances Cresenzo-Dishmon  Stephens, Devin A 10/08/2013, 11:54 PM

## 2013-10-09 ENCOUNTER — Inpatient Hospital Stay (HOSPITAL_COMMUNITY): Payer: Medicaid Other

## 2013-10-09 DIAGNOSIS — O99330 Smoking (tobacco) complicating pregnancy, unspecified trimester: Secondary | ICD-10-CM | POA: Diagnosis not present

## 2013-10-09 DIAGNOSIS — R1031 Right lower quadrant pain: Secondary | ICD-10-CM | POA: Diagnosis present

## 2013-10-09 DIAGNOSIS — O99891 Other specified diseases and conditions complicating pregnancy: Secondary | ICD-10-CM | POA: Diagnosis not present

## 2013-10-09 DIAGNOSIS — K219 Gastro-esophageal reflux disease without esophagitis: Secondary | ICD-10-CM | POA: Diagnosis not present

## 2013-10-09 LAB — COMPREHENSIVE METABOLIC PANEL
ALBUMIN: 2.7 g/dL — AB (ref 3.5–5.2)
ALK PHOS: 57 U/L (ref 39–117)
ALT: 6 U/L (ref 0–35)
ANION GAP: 10 (ref 5–15)
AST: 17 U/L (ref 0–37)
BILIRUBIN TOTAL: 0.2 mg/dL — AB (ref 0.3–1.2)
BUN: 9 mg/dL (ref 6–23)
CHLORIDE: 108 meq/L (ref 96–112)
CO2: 21 mEq/L (ref 19–32)
Calcium: 8.1 mg/dL — ABNORMAL LOW (ref 8.4–10.5)
Creatinine, Ser: 0.79 mg/dL (ref 0.50–1.10)
GFR calc non Af Amer: >90 mL/min (ref >90)
GLUCOSE: 92 mg/dL (ref 70–99)
POTASSIUM: 3.4 meq/L — AB (ref 3.7–5.3)
SODIUM: 139 meq/L (ref 137–147)
Total Protein: 5.9 g/dL — ABNORMAL LOW (ref 6.0–8.3)

## 2013-10-09 LAB — CBC WITH DIFFERENTIAL/PLATELET
Basophils Absolute: 0 10*3/uL (ref 0.0–0.1)
Basophils Relative: 0 % (ref 0–1)
Eosinophils Absolute: 0.2 10*3/uL (ref 0.0–0.7)
Eosinophils Relative: 2 % (ref 0–5)
HCT: 29 % — ABNORMAL LOW (ref 36.0–46.0)
HEMOGLOBIN: 10.2 g/dL — AB (ref 12.0–15.0)
LYMPHS ABS: 1.8 10*3/uL (ref 0.7–4.0)
LYMPHS PCT: 23 % (ref 12–46)
MCH: 28.6 pg (ref 26.0–34.0)
MCHC: 35.2 g/dL (ref 30.0–36.0)
MCV: 81.2 fL (ref 78.0–100.0)
MONOS PCT: 10 % (ref 3–12)
Monocytes Absolute: 0.8 10*3/uL (ref 0.1–1.0)
NEUTROS ABS: 5 10*3/uL (ref 1.7–7.7)
NEUTROS PCT: 65 % (ref 43–77)
Platelets: 162 10*3/uL (ref 150–400)
RBC: 3.57 MIL/uL — AB (ref 3.87–5.11)
RDW: 12.7 % (ref 11.5–15.5)
WBC: 7.9 10*3/uL (ref 4.0–10.5)

## 2013-10-09 IMAGING — US US ABDOMEN COMPLETE
1 series · 14 of 25 positions shown · non-contrast
Comparison: None.

CLINICAL DATA: Right flank pain.  Pregnancy.

EXAM:
ULTRASOUND ABDOMEN COMPLETE

[Series 1: us abdomen complete · 14 of 89 slices shown]
[im 1/89]
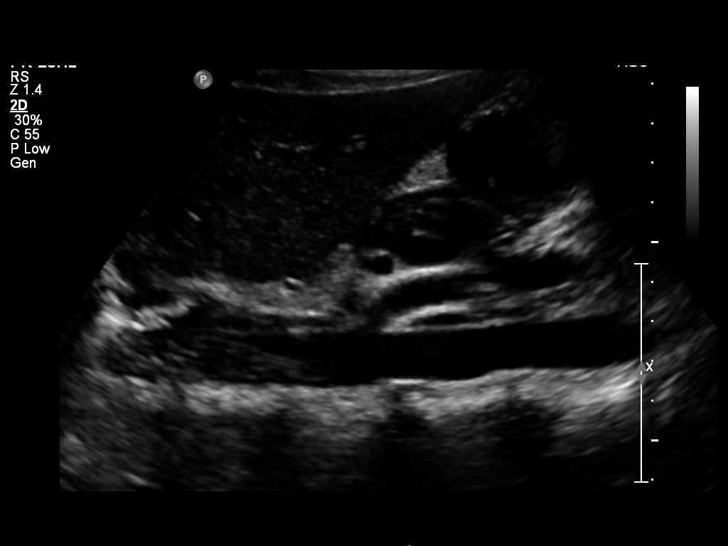
[im 8/89]
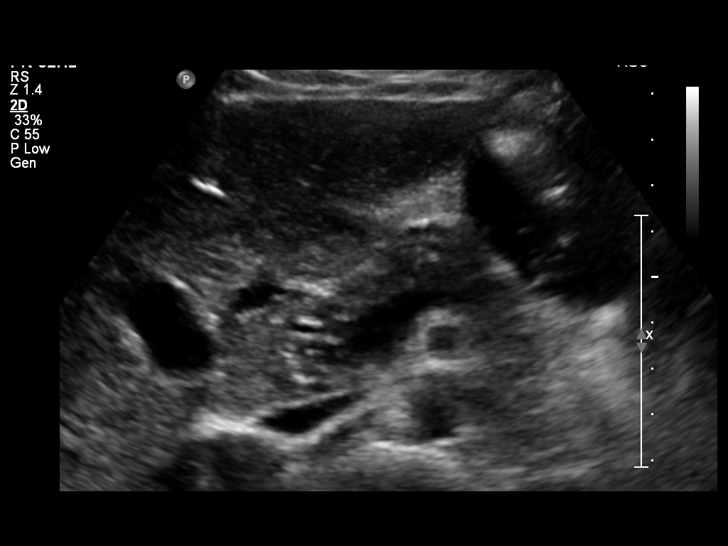
[im 15/89]
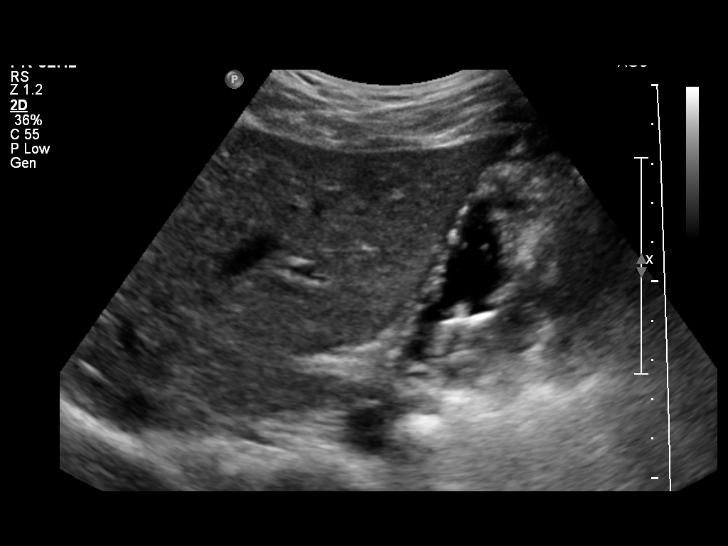
[im 23/89]
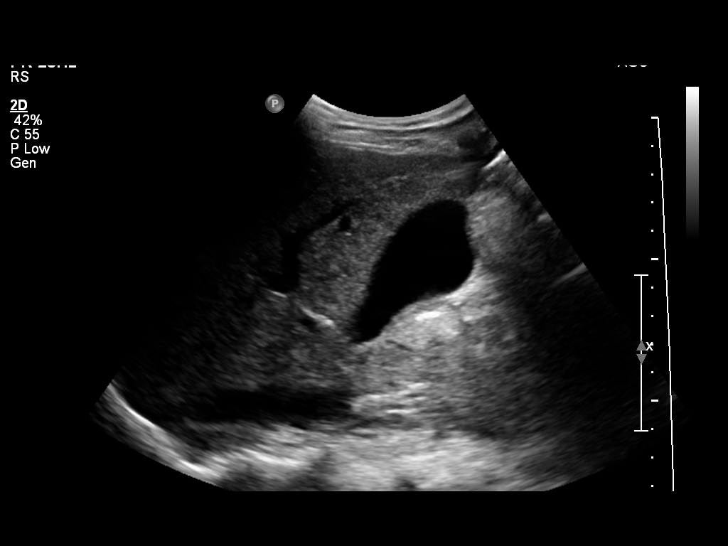
[im 30/89]
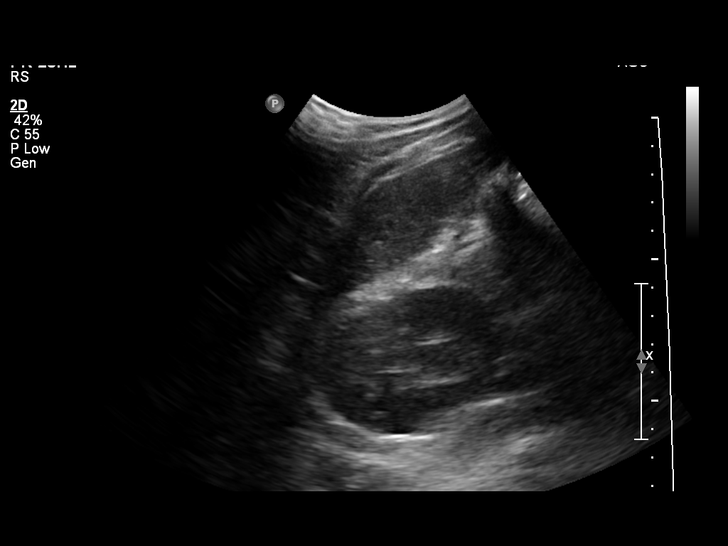
[im 34/89]
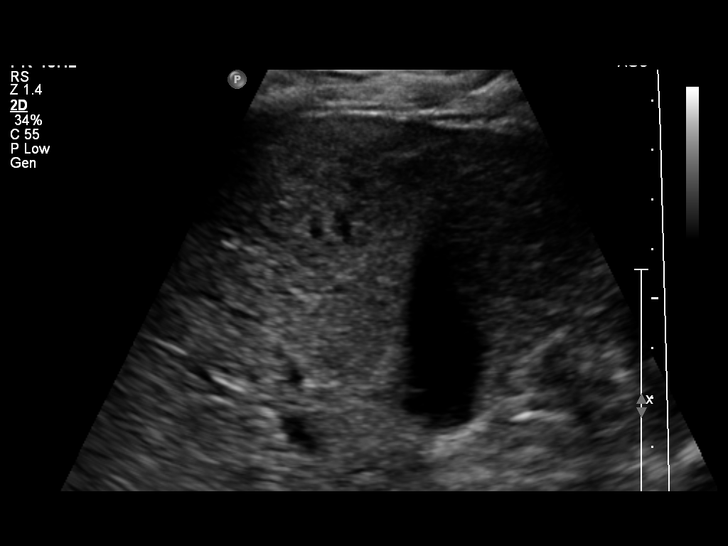
[im 41/89]
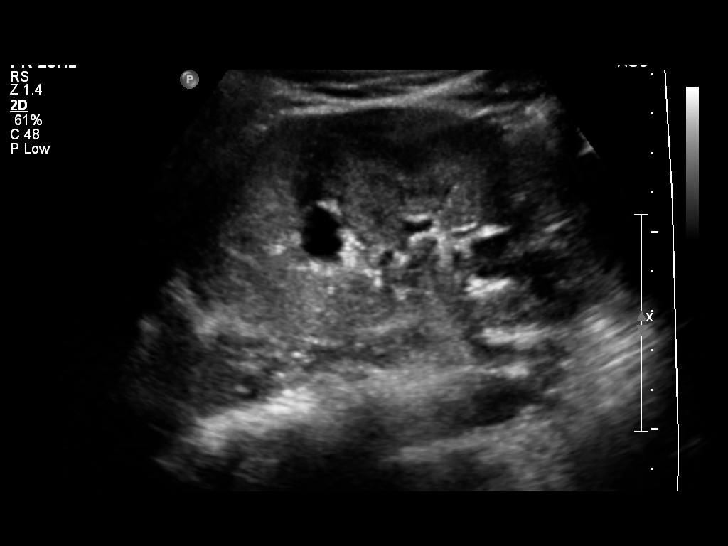
[im 48/89]
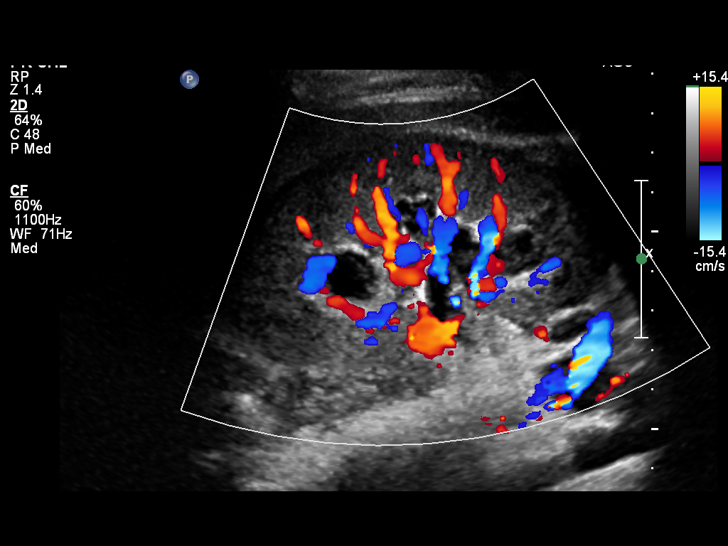
[im 56/89]
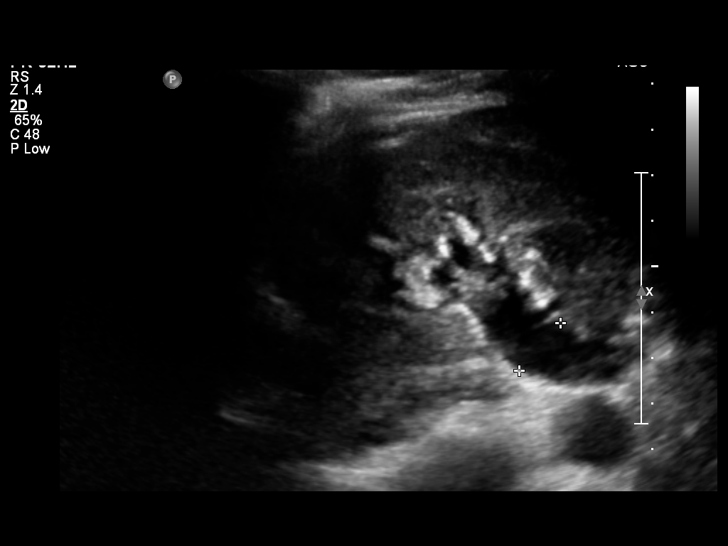
[im 59/89]
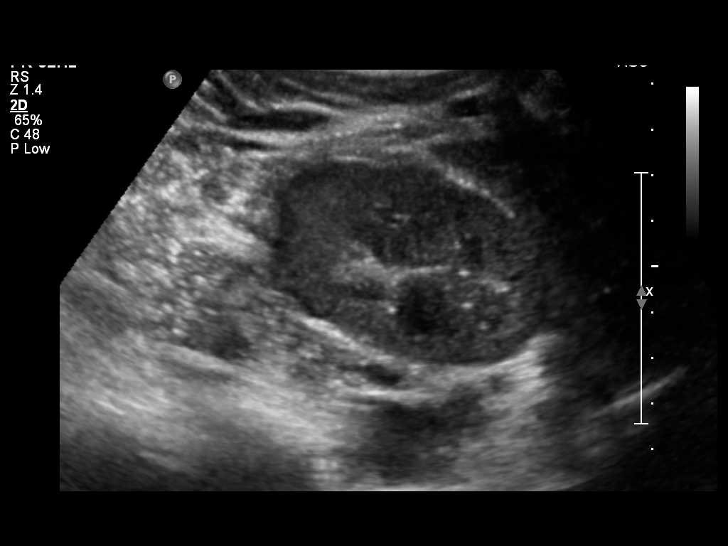
[im 67/89]
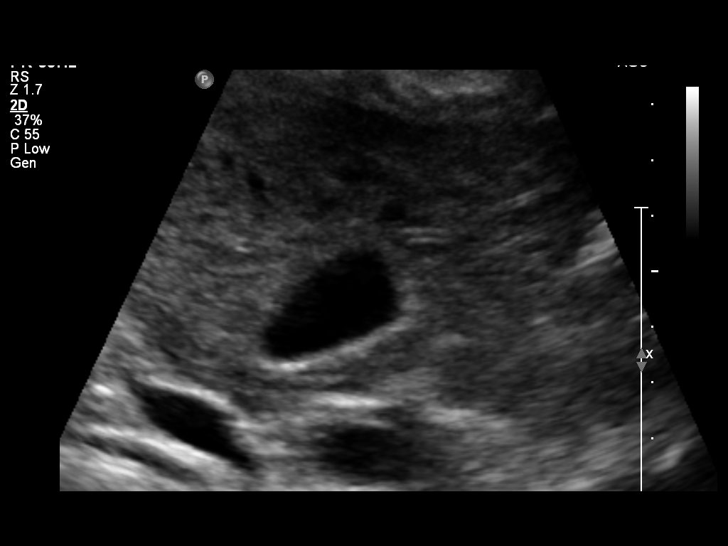
[im 74/89]
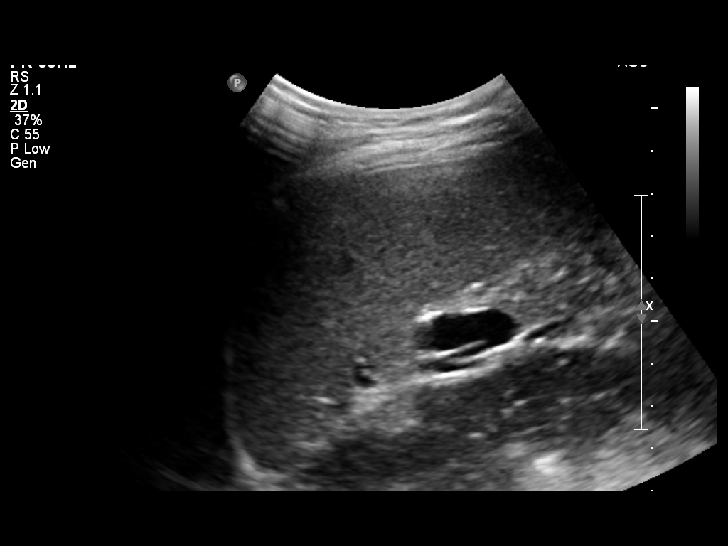
[im 81/89]
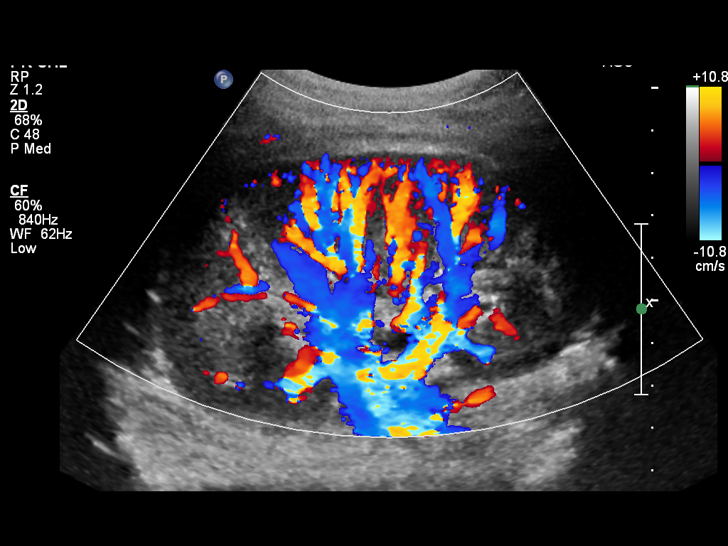
[im 89/89]
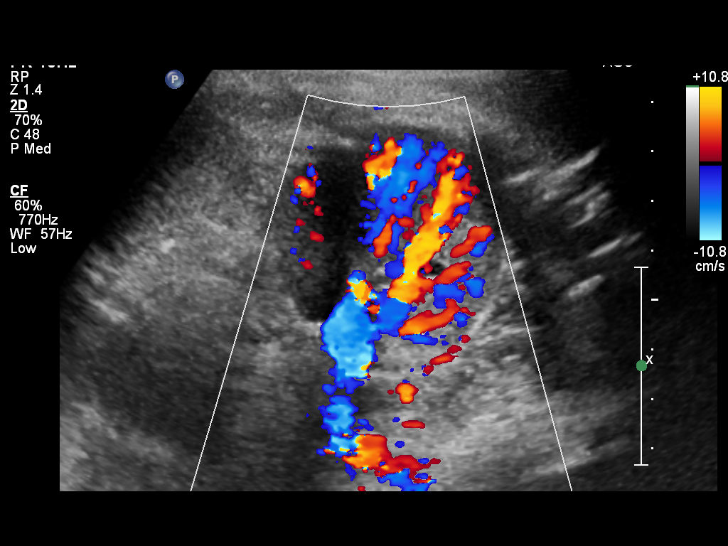

[14 of 25 positions shown; findings below may reference images not displayed]

FINDINGS: Gallbladder:

No gallstones or wall thickening visualized. No sonographic Murphy
sign noted.

Common bile duct:

Diameter: 2 mm

Liver:

No focal lesion identified. Within normal limits in parenchymal
echogenicity.

IVC:

No abnormality visualized.

Pancreas:

Visualized portion unremarkable.

Spleen:

Size and appearance within normal limits.

Right Kidney:

Length: 11 cm. There is mild hydronephrosis. It is unclear if this
is from pregnancy, reactive to infection, or urinary obstruction.
The intrarenal collecting system contains mid level echoes without
shadowing. No abscess.

Left Kidney:

Length: 11 cm. Echogenicity within normal limits. No mass or
hydronephrosis visualized.

Abdominal aorta:

No aneurysm visualized.

Other findings:

None.
IMPRESSION: 1. Mild right hydronephrosis.
2. Debris in the right urinary collecting system which could be
infectious. Correlate with urinalysis.

## 2013-10-09 MED ORDER — OXYCODONE-ACETAMINOPHEN 5-325 MG PO TABS: 1.0000 | ORAL_TABLET | ORAL | Status: DC | PRN

## 2013-10-09 MED ORDER — HYDROMORPHONE HCL PF 1 MG/ML IJ SOLN
1.0000 mg | INTRAMUSCULAR | Status: DC | PRN
  Administered 2013-10-09: 1 mg via INTRAVENOUS
  Filled 2013-10-09: qty 1

## 2013-10-09 MED ORDER — SODIUM CHLORIDE 0.9 % IV SOLN
INTRAVENOUS | Status: DC
  Administered 2013-10-09: 01:00:00 via INTRAVENOUS

## 2013-10-09 MED ORDER — KETOROLAC TROMETHAMINE 30 MG/ML IJ SOLN
30.0000 mg | Freq: Once | INTRAMUSCULAR | Status: AC
  Administered 2013-10-09: 30 mg via INTRAVENOUS
  Filled 2013-10-09: qty 1

## 2013-10-09 NOTE — Discharge Instructions (Signed)

## 2013-10-12 ENCOUNTER — Telehealth (HOSPITAL_COMMUNITY): Payer: Self-pay | Admitting: *Deleted

## 2013-12-24 ENCOUNTER — Other Ambulatory Visit (HOSPITAL_COMMUNITY): Payer: Self-pay | Admitting: Physician Assistant

## 2013-12-24 DIAGNOSIS — O480 Post-term pregnancy: Secondary | ICD-10-CM

## 2013-12-28 ENCOUNTER — Ambulatory Visit (HOSPITAL_COMMUNITY)
Admission: RE | Admit: 2013-12-28 | Discharge: 2013-12-28 | Disposition: A | Discharge status: Home / Self Care | Payer: Medicaid Other | Source: Ambulatory Visit | Attending: Physician Assistant | Admitting: Physician Assistant

## 2013-12-28 ENCOUNTER — Inpatient Hospital Stay (HOSPITAL_COMMUNITY)
Admission: AD | Admit: 2013-12-28 | Discharge: 2013-12-30 | DRG: 775 | Disposition: A | Discharge status: Home / Self Care | Payer: Medicaid Other | Source: Ambulatory Visit | Attending: Obstetrics and Gynecology | Admitting: Obstetrics and Gynecology

## 2013-12-28 ENCOUNTER — Encounter (HOSPITAL_COMMUNITY): Payer: Medicaid Other | Admitting: Anesthesiology

## 2013-12-28 ENCOUNTER — Inpatient Hospital Stay (HOSPITAL_COMMUNITY): Payer: Medicaid Other | Admitting: Anesthesiology

## 2013-12-28 ENCOUNTER — Encounter (HOSPITAL_COMMUNITY): Payer: Self-pay | Admitting: *Deleted

## 2013-12-28 DIAGNOSIS — O9902 Anemia complicating childbirth: Secondary | ICD-10-CM | POA: Diagnosis present

## 2013-12-28 DIAGNOSIS — D573 Sickle-cell trait: Secondary | ICD-10-CM | POA: Diagnosis present

## 2013-12-28 DIAGNOSIS — Z833 Family history of diabetes mellitus: Secondary | ICD-10-CM | POA: Diagnosis not present

## 2013-12-28 DIAGNOSIS — Z8249 Family history of ischemic heart disease and other diseases of the circulatory system: Secondary | ICD-10-CM | POA: Diagnosis not present

## 2013-12-28 DIAGNOSIS — Z3A40 40 weeks gestation of pregnancy: Secondary | ICD-10-CM | POA: Diagnosis present

## 2013-12-28 DIAGNOSIS — O480 Post-term pregnancy: Secondary | ICD-10-CM

## 2013-12-28 DIAGNOSIS — IMO0001 Reserved for inherently not codable concepts without codable children: Secondary | ICD-10-CM

## 2013-12-28 DIAGNOSIS — O471 False labor at or after 37 completed weeks of gestation: Secondary | ICD-10-CM | POA: Diagnosis present

## 2013-12-28 DIAGNOSIS — Z87891 Personal history of nicotine dependence: Secondary | ICD-10-CM

## 2013-12-28 HISTORY — DX: Sickle-cell trait: D57.3

## 2013-12-28 HISTORY — DX: Calculus of kidney: N20.0

## 2013-12-28 HISTORY — DX: Unspecified abnormal cytological findings in specimens from vagina: R87.629

## 2013-12-28 HISTORY — DX: Pregnancy related renal disease, unspecified trimester: O26.839

## 2013-12-28 LAB — CBC
HCT: 26.9 % — ABNORMAL LOW (ref 36.0–46.0)
Hemoglobin: 8.8 g/dL — ABNORMAL LOW (ref 12.0–15.0)
MCH: 23.8 pg — AB (ref 26.0–34.0)
MCHC: 32.7 g/dL (ref 30.0–36.0)
MCV: 72.7 fL — AB (ref 78.0–100.0)
Platelets: 200 10*3/uL (ref 150–400)
RBC: 3.7 MIL/uL — ABNORMAL LOW (ref 3.87–5.11)
RDW: 13.8 % (ref 11.5–15.5)
WBC: 6.2 10*3/uL (ref 4.0–10.5)

## 2013-12-28 LAB — HIV ANTIBODY (ROUTINE TESTING W REFLEX): HIV 1&2 Ab, 4th Generation: NONREACTIVE

## 2013-12-28 LAB — RPR

## 2013-12-28 MED ORDER — OXYTOCIN 40 UNITS IN LACTATED RINGERS INFUSION - SIMPLE MED
62.5000 mL/h | INTRAVENOUS | Status: DC
  Administered 2013-12-28: 999 mL/h via INTRAVENOUS
  Filled 2013-12-28: qty 1000

## 2013-12-28 MED ORDER — FENTANYL 2.5 MCG/ML BUPIVACAINE 1/10 % EPIDURAL INFUSION (WH - ANES)
14.0000 mL/h | INTRAMUSCULAR | Status: DC | PRN
  Administered 2013-12-28: 14 mL/h via EPIDURAL
  Filled 2013-12-28: qty 125

## 2013-12-28 MED ORDER — EPHEDRINE 5 MG/ML INJ: 10.0000 mg | INTRAVENOUS | Status: DC | PRN

## 2013-12-28 MED ORDER — PRENATAL MULTIVITAMIN CH
1.0000 | ORAL_TABLET | Freq: Every day | ORAL | Status: DC
  Administered 2013-12-29: 1 via ORAL
  Filled 2013-12-28: qty 1

## 2013-12-28 MED ORDER — OXYCODONE-ACETAMINOPHEN 5-325 MG PO TABS
1.0000 | ORAL_TABLET | ORAL | Status: DC | PRN
  Administered 2013-12-28 – 2013-12-30 (×3): 1 via ORAL
  Filled 2013-12-28 (×5): qty 1

## 2013-12-28 MED ORDER — PHENYLEPHRINE 40 MCG/ML (10ML) SYRINGE FOR IV PUSH (FOR BLOOD PRESSURE SUPPORT)
80.0000 ug | PREFILLED_SYRINGE | INTRAVENOUS | Status: DC | PRN
  Filled 2013-12-28: qty 10

## 2013-12-28 MED ORDER — FENTANYL CITRATE 0.05 MG/ML IJ SOLN: 50.0000 ug | Freq: Once | INTRAMUSCULAR | Status: DC

## 2013-12-28 MED ORDER — LANOLIN HYDROUS EX OINT
TOPICAL_OINTMENT | CUTANEOUS | Status: DC | PRN
Start: 2013-12-28 — End: 2013-12-30

## 2013-12-28 MED ORDER — ACETAMINOPHEN 325 MG PO TABS
650.0000 mg | ORAL_TABLET | ORAL | Status: DC | PRN
Start: 2013-12-28 — End: 2013-12-28

## 2013-12-28 MED ORDER — OXYCODONE-ACETAMINOPHEN 5-325 MG PO TABS: 1.0000 | ORAL_TABLET | ORAL | Status: DC | PRN

## 2013-12-28 MED ORDER — OXYCODONE-ACETAMINOPHEN 5-325 MG PO TABS: 2.0000 | ORAL_TABLET | ORAL | Status: DC | PRN

## 2013-12-28 MED ORDER — DIBUCAINE 1 % RE OINT
1.0000 "application " | TOPICAL_OINTMENT | RECTAL | Status: DC | PRN
  Filled 2013-12-28: qty 28

## 2013-12-28 MED ORDER — BENZOCAINE-MENTHOL 20-0.5 % EX AERO
1.0000 "application " | INHALATION_SPRAY | CUTANEOUS | Status: DC | PRN
  Administered 2013-12-28: 1 via TOPICAL
  Filled 2013-12-28 (×2): qty 56

## 2013-12-28 MED ORDER — PHENYLEPHRINE 40 MCG/ML (10ML) SYRINGE FOR IV PUSH (FOR BLOOD PRESSURE SUPPORT)
80.0000 ug | PREFILLED_SYRINGE | INTRAVENOUS | Status: DC | PRN
Start: 2013-12-28 — End: 2013-12-28

## 2013-12-28 MED ORDER — ONDANSETRON HCL 4 MG/2ML IJ SOLN: 4.0000 mg | INTRAMUSCULAR | Status: DC | PRN

## 2013-12-28 MED ORDER — LIDOCAINE HCL (PF) 1 % IJ SOLN
30.0000 mL | INTRAMUSCULAR | Status: DC | PRN
  Filled 2013-12-28: qty 30

## 2013-12-28 MED ORDER — LACTATED RINGERS IV SOLN
INTRAVENOUS | Status: DC
  Administered 2013-12-28 (×2): via INTRAVENOUS

## 2013-12-28 MED ORDER — ONDANSETRON HCL 4 MG PO TABS: 4.0000 mg | ORAL_TABLET | ORAL | Status: DC | PRN

## 2013-12-28 MED ORDER — LACTATED RINGERS IV SOLN: 500.0000 mL | Freq: Once | INTRAVENOUS | Status: DC

## 2013-12-28 MED ORDER — CITRIC ACID-SODIUM CITRATE 334-500 MG/5ML PO SOLN: 30.0000 mL | ORAL | Status: DC | PRN

## 2013-12-28 MED ORDER — ZOLPIDEM TARTRATE 5 MG PO TABS: 5.0000 mg | ORAL_TABLET | Freq: Every evening | ORAL | Status: DC | PRN

## 2013-12-28 MED ORDER — IBUPROFEN 600 MG PO TABS
600.0000 mg | ORAL_TABLET | Freq: Four times a day (QID) | ORAL | Status: DC
  Administered 2013-12-28 – 2013-12-30 (×8): 600 mg via ORAL
  Filled 2013-12-28 (×8): qty 1

## 2013-12-28 MED ORDER — BUTORPHANOL TARTRATE 1 MG/ML IJ SOLN
1.0000 mg | Freq: Once | INTRAMUSCULAR | Status: AC
  Administered 2013-12-28: 1 mg via INTRAVENOUS
  Filled 2013-12-28: qty 1

## 2013-12-28 MED ORDER — SIMETHICONE 80 MG PO CHEW: 80.0000 mg | CHEWABLE_TABLET | ORAL | Status: DC | PRN

## 2013-12-28 MED ORDER — OXYTOCIN BOLUS FROM INFUSION: 500.0000 mL | INTRAVENOUS | Status: DC

## 2013-12-28 MED ORDER — ONDANSETRON HCL 4 MG/2ML IJ SOLN: 4.0000 mg | Freq: Four times a day (QID) | INTRAMUSCULAR | Status: DC | PRN

## 2013-12-28 MED ORDER — TETANUS-DIPHTH-ACELL PERTUSSIS 5-2.5-18.5 LF-MCG/0.5 IM SUSP
0.5000 mL | Freq: Once | INTRAMUSCULAR | Status: DC
  Filled 2013-12-28: qty 0.5

## 2013-12-28 MED ORDER — SENNOSIDES-DOCUSATE SODIUM 8.6-50 MG PO TABS
2.0000 | ORAL_TABLET | ORAL | Status: DC
  Administered 2013-12-28 – 2013-12-30 (×2): 2 via ORAL
  Filled 2013-12-28 (×2): qty 2

## 2013-12-28 MED ORDER — FENTANYL 2.5 MCG/ML BUPIVACAINE 1/10 % EPIDURAL INFUSION (WH - ANES): 14.0000 mL/h | INTRAMUSCULAR | Status: DC | PRN

## 2013-12-28 MED ORDER — WITCH HAZEL-GLYCERIN EX PADS: 1.0000 "application " | MEDICATED_PAD | CUTANEOUS | Status: DC | PRN

## 2013-12-28 MED ORDER — LACTATED RINGERS IV SOLN: 500.0000 mL | INTRAVENOUS | Status: DC | PRN

## 2013-12-28 MED ORDER — DIPHENHYDRAMINE HCL 25 MG PO CAPS: 25.0000 mg | ORAL_CAPSULE | Freq: Four times a day (QID) | ORAL | Status: DC | PRN

## 2013-12-28 MED ORDER — OXYCODONE-ACETAMINOPHEN 5-325 MG PO TABS
2.0000 | ORAL_TABLET | ORAL | Status: DC | PRN
  Administered 2013-12-30: 2 via ORAL

## 2013-12-28 MED ORDER — DIPHENHYDRAMINE HCL 50 MG/ML IJ SOLN: 12.5000 mg | INTRAMUSCULAR | Status: DC | PRN

## 2013-12-28 NOTE — MAU Note (Signed)
Arrived in room 172 with pt.

## 2013-12-28 NOTE — MAU Note (Signed)
Strip reviewed with Shela Commonsolie Hayes RN - ok to transfer to BS .

## 2013-12-28 NOTE — H&P (Signed)
Tasha Wu is a 23 y.o. female presenting for contractions that began last night.  Denies vaginal bleeding or leaking of fluid.  Pt was scheduled for BPP this am, but sent to MAU for labor evaluation due to pain of contractions.  [facility] at 11 wks of pregnancy.  No complications this pregnancy.  Pt is positive for sickle cell trait.  Father of baby has not been screened.    History OB History   Grav Para Term Preterm Abortions TAB SAB Ect Mult Living   3 1 1  1     1      Past Medical History  Diagnosis Date  . Infection     Yeast;would get frequently  . Infection     BV x 1  . Kidney infection 2010    PO antibxs  . Migraines     no rx'd meds in the past;goes to sleep  . Umbilical hernia 1991    Does not cause anyp problems  . Emotional instability     Break down easily when things are not going her way  . Anxiety 2010    anxiety attack;no meds;has not been dx'd  . Hx of joint problems     Shoulders and knees have pain  . Abnormal Pap smear 12/10/2011  . GERD (gastroesophageal reflux disease)   . Vaginal Pap smear, abnormal   . Sickle cell trait   . Kidney stone complicating pregnancy    Past Surgical History  Procedure Laterality Date  . Dilation and curettage of uterus  2009    No complications  . Wisdom tooth extraction     Family History: family history includes Anemia in her mother; Arthritis in her paternal grandmother; Asthma in her sister; Diabetes in her maternal aunt and paternal grandfather; Diabetes type I in her cousin; Heart disease in her maternal aunt; Hernia in her maternal aunt; Hypertension in her maternal uncle and mother; Hyperthyroidism in her paternal grandmother; Migraines in her father; Other in her father; Scoliosis in her paternal grandmother; Sickle cell anemia in her maternal aunt and maternal grandmother; Sickle cell trait in her brother and mother. Social History:  reports  that she quit smoking about 2 years ago. Her smoking use included Cigarettes. She smoked 0.25 packs per day. She has never used smokeless tobacco. She reports that she does not drink alcohol or use illicit drugs.   Review of Systems  Gastrointestinal: Positive for abdominal pain.  All other systems reviewed and are negative.   Dilation: 7.5 Effacement (%): 100 Station: -1 Exam by:: Dorrene GermanJ. Lowe RN Blood pressure 118/75, pulse 70, temperature 97.8 F (36.6 C), temperature source Oral, resp. rate 18, last menstrual period 03/19/2013. Maternal Exam:  Introitus: Vagina is positive for vaginal discharge (mucusy).    Physical Exam  Constitutional: She is oriented to person, place, and time. She appears well-developed and well-nourished. No distress.  HENT:  Head: Normocephalic.  Neck: Normal range of motion. Neck supple.  Cardiovascular: Normal rate, regular rhythm and normal heart sounds.   Respiratory: Effort normal and breath sounds normal.  GI: Soft. There is no tenderness.  EFW 6-6.5 lbs  Genitourinary: No bleeding around the vagina. Vaginal discharge (mucusy) found.  Musculoskeletal: Normal range of motion. She exhibits edema (1+ bilat pedal edema).  Neurological: She is alert and oriented to person, place, and time.  Skin: Skin is warm and dry.    Prenatal labs: ABO, Rh:   Antibody:  Rubella:   RPR:    HBsAg:    HIV:    GBS:     Assessment/Plan: 23 yo G3P1011 at 3125w4d wks IUP Active Labor GBS neg Short Pregnancy Interval  +Sickle Cell Trait  Plan: Admit to Birthing Suites Epidural per patient request Anticipate NSVD   Tasha EdelsonKARIM, Meshulem Onorato N 12/28/2013, 9:51 AM

## 2013-12-28 NOTE — Progress Notes (Signed)
  Subjective: Pt comfortable with epidural.  Objective: BP 120/79  Pulse 66  Temp(Src) 97.8 F (36.6 C) (Oral)  Resp 16  Ht 5\' 7"  (1.702 m)  Wt 68.856 kg (151 lb 12.8 oz)  BMI 23.77 kg/m2  SpO2 100%  LMP 03/19/2013      FHT:  FHR: 130's bpm, variability: moderate,  accelerations:  Present,  decelerations:  Absent UC:   regular, every 3-4 minutes SVE:   Dilation: 9 Effacement (%): 100 Station: 0 Exam by:: Herma CarsonLindsay Lima, rn AROM > clear Labs: Lab Results  Component Value Date   WBC 6.2 12/28/2013   HGB 8.8* 12/28/2013   HCT 26.9* 12/28/2013   MCV 72.7* 12/28/2013   PLT 200 12/28/2013    Assessment / Plan: Spontaneous labor, progressing normally  Labor: Progressing normally Preeclampsia:  n/a Fetal Wellbeing:  Category I Pain Control:  Epidural I/D:  GBS neg Anticipated MOD:  NSVD  KARIM, WALIDAH N 12/28/2013, 12:10 PM

## 2013-12-28 NOTE — Anesthesia Preprocedure Evaluation (Signed)
Anesthesia Evaluation  Patient identified by MRN, date of birth, ID band Patient awake    Reviewed: Allergy & Precautions, H&P , Patient's Chart, lab work & pertinent test results  Airway Mallampati: II  TM Distance: >3 FB Neck ROM: full    Dental  (+) Teeth Intact   Pulmonary former smoker,  breath sounds clear to auscultation        Cardiovascular Rhythm:regular Rate:Normal     Neuro/Psych    GI/Hepatic   Endo/Other    Renal/GU      Musculoskeletal   Abdominal   Peds  Hematology  (+) anemia ,   Anesthesia Other Findings       Reproductive/Obstetrics (+) Pregnancy                             Anesthesia Physical Anesthesia Plan  ASA: II  Anesthesia Plan: Epidural   Post-op Pain Management:    Induction:   Airway Management Planned:   Additional Equipment:   Intra-op Plan:   Post-operative Plan:   Informed Consent: I have reviewed the patients History and Physical, chart, labs and discussed the procedure including the risks, benefits and alternatives for the proposed anesthesia with the patient or authorized representative who has indicated his/her understanding and acceptance.   Dental Advisory Given  Plan Discussed with:   Anesthesia Plan Comments: (Labs checked- platelets confirmed with RN in room. Fetal heart tracing, per RN, reported to be stable enough for sitting procedure. Discussed epidural, and patient consents to the procedure:  included risk of possible headache,backache, failed block, allergic reaction, and nerve injury. This patient was asked if she had any questions or concerns before the procedure started.)        Anesthesia Quick Evaluation  

## 2013-12-28 NOTE — MAU Note (Signed)
Pt states she began having back pain last night, began having uc's which are now very intense.  Came for U/S appt for BPP this a.m. & was too uncomfortable to have the U/S, came to MAU.  Denies bleeding or LOF.  Was 3 cm's last week.

## 2013-12-28 NOTE — H&P (Signed)
Attestation of Attending Supervision of Advanced Practitioner (CNM/NP): Evaluation and management procedures were performed by the Advanced Practitioner under my supervision and collaboration.  I have reviewed the Advanced Practitioner's note and chart, and I agree with the management and plan.  Jeryl Wilbourn 12/28/2013 1:03 PM   

## 2013-12-28 NOTE — MAU Note (Signed)
Pt to go to room 172

## 2013-12-29 MED ORDER — LIDOCAINE HCL (PF) 1 % IJ SOLN
INTRAMUSCULAR | Status: DC | PRN
  Administered 2013-12-28: 4 mL
  Administered 2013-12-28: 6 mL

## 2013-12-29 MED ORDER — FENTANYL 2.5 MCG/ML BUPIVACAINE 1/10 % EPIDURAL INFUSION (WH - ANES)
INTRAMUSCULAR | Status: DC | PRN
  Administered 2013-12-28: 14 mL/h via EPIDURAL

## 2013-12-29 NOTE — Progress Notes (Signed)
UR chart review completed.  

## 2013-12-29 NOTE — Anesthesia Postprocedure Evaluation (Signed)
  Anesthesia Post-op Note  Patient: Tasha Wu  Procedure(s) Performed: * No procedures listed *  Patient Location: Mother/Baby  Anesthesia Type:Epidural  Level of Consciousness: awake, alert , oriented and patient cooperative  Airway and Oxygen Therapy: Patient Spontanous Breathing  Post-op Pain: mild  Post-op Assessment: Post-op Vital signs reviewed, Patient's Cardiovascular Status Stable, Respiratory Function Stable, Patent Airway, No headache, No backache, No residual numbness and No residual motor weakness  Post-op Vital Signs: Reviewed and stable  Last Vitals:  Filed Vitals:   12/29/13 0536  BP: 122/75  Pulse: 94  Temp: 36.3 C  Resp: 19    Complications: No apparent anesthesia complications

## 2013-12-29 NOTE — Progress Notes (Cosign Needed)
Post Partum Day 1 Subjective: Tasha Wu is a 23 y.o. [id number] 1853w4d s/p NSVD. No acute events overnight. Pt denies problems with ambulating, voiding or po intake. She denies nausea or vomiting. Pain is well controlled. She has had flatus. She has not had bowel movement. Plan for birth control is Depot. Method of Feeding: bottle.    Objective: Blood pressure 122/75, pulse 94, temperature 97.3 F (36.3 C), temperature source Oral, resp. rate 19, height 5\' 7"  (1.702 m), weight 68.856 kg (151 lb 12.8 oz), last menstrual period 03/19/2013, SpO2 100.00%, unknown if currently breastfeeding.  Physical Exam:  General: alert and cooperative Lochia: appropriate Uterine Fundus: firm DVT Evaluation: No evidence of DVT seen on physical exam.   Recent Labs  12/28/13 0820  HGB 8.8*  HCT 26.9*    Assessment/Plan: 23 y/o G3P2012 ppd#1 s/p NSVD doing well.   Plan for discharge today or tomorrow.    LOS: 1 day   Janalyn RouseHoward, Madysun Thall 12/29/2013, 8:51 AM

## 2013-12-29 NOTE — Anesthesia Procedure Notes (Signed)
Epidural Patient location during procedure: OB  Preanesthetic Checklist Completed: patient identified, site marked, surgical consent, pre-op evaluation, timeout performed, IV checked, risks and benefits discussed and monitors and equipment checked  Epidural Patient position: sitting Prep: site prepped and draped and DuraPrep Patient monitoring: continuous pulse ox and blood pressure Approach: midline Location: L3-L4 Injection technique: LOR air  Needle:  Needle type: Tuohy  Needle gauge: 17 G Needle length: 9 cm and 9 Needle insertion depth: 5 cm cm Catheter type: closed end flexible Catheter size: 19 Gauge Catheter at skin depth: 10 cm Test dose: negative  Assessment Events: blood not aspirated, injection not painful, no injection resistance, negative IV test and no paresthesia  Additional Notes Dosing of Epidural:  1st dose, through catheter .............................................  Xylocaine 40 mg  2nd dose, through catheter, after waiting 3 minutes.........Xylocaine 60 mg    ( 1% Xylo charted as a single dose in Epic Meds for ease of charting; actual dosing was fractionated as above, for saftey's sake)  As each dose occurred, patient was free of IV sx; and patient exhibited no evidence of SA injection.  Patient is more comfortable after epidural dosed. Please see RN's note for documentation of vital signs,and FHR which are stable.  Patient reminded not to try to ambulate with numb legs, and that an RN must be present when she attempts to get up.       

## 2013-12-30 MED ORDER — IBUPROFEN 600 MG PO TABS: 600.0000 mg | ORAL_TABLET | Freq: Four times a day (QID) | ORAL | Status: DC

## 2013-12-30 MED ORDER — OXYCODONE-ACETAMINOPHEN 5-325 MG PO TABS: 1.0000 | ORAL_TABLET | ORAL | Status: DC | PRN

## 2013-12-30 NOTE — Progress Notes (Signed)
Pt discharged home with significant other... Discharge instructions reviewed and pt verbalized understanding... Condition stable... No equipment... Ambulated to car with Selinda MichaelsE. Nydia Ytuarte, RN.

## 2013-12-30 NOTE — Discharge Instructions (Signed)

## 2013-12-30 NOTE — Discharge Summary (Signed)
Obstetric Discharge Summary Reason for Admission: onset of labor Prenatal Procedures: NST Intrapartum Procedures: spontaneous vaginal delivery Postpartum Procedures: none Complications-Operative and Postpartum: 1st degree perineal laceration Hemoglobin  Date Value Ref Range Status  12/28/2013 8.8* 12.0 - 15.0 g/dL Final     HCT  Date Value Ref Range Status  12/28/2013 26.9* 36.0 - 46.0 % Final  Hospital Course: Tasha Wu AlertMahogany Wu is a 23 y.o. female presenting for contractions that began last night. Denies vaginal bleeding or leaking of fluid. Pt was scheduled for BPP this am, but sent to MAU for labor evaluation due to pain of contractions.  [facility] at 11 wks of pregnancy. No complications this pregnancy. Pt is positive for sickle cell trait. Father of baby has not been screened.  Delivery Note  At 12:34 PM a viable female was delivered via Vaginal, Spontaneous Delivery (Presentation: Right Occiput Anterior). APGAR: 9, 9; weight pending.  Placenta status: Intact, Spontaneous. Cord: with the following complications: None.  Anesthesia: Epidural  Episiotomy: None  Lacerations: Small hemastatic 1st degree perineal laceration, no repair required  Suture Repair: None  Est. Blood Loss (mL): 300  Mom to postpartum. Baby to Couplet care / Skin to Skin.  William DaltonMcEachern, Morgan  12/28/2013, 12:46 PM   Has done well postpartum. Wants to use DepoProvera for contraception but wants to wait until PP appt due to heavy bleeding last time when she got it immediately.     Physical Exam:  General: alert, cooperative and no distress Lochia: appropriate Uterine Fundus: firm Incision: healing well, no significant drainage DVT Evaluation: No evidence of DVT seen on physical exam.  Discharge Diagnoses: Term Pregnancy-delivered  Discharge Information: Date: 12/30/2013 Activity: unrestricted and pelvic rest Diet: routine Medications:  PNV, Ibuprofen and Percocet Condition: stable and improved Instructions: refer to practice specific booklet Discharge to: home   Newborn Data: Live born female  Birth Weight: 8 lb 6 oz (3800 g) APGAR: 9, 9  Home with mother.  [facility] CenterWILLIAMS,Johny Pitstick 12/30/2013, 5:48 AM

## 2013-12-31 NOTE — Discharge Summary (Signed)
Attestation of Attending Supervision of Advanced Practitioner (PA/CNM/NP): Evaluation and management procedures were performed by the Advanced Practitioner under my supervision and collaboration.  I have reviewed the Advanced Practitioner's note and chart, and I agree with the management and plan.  Jacob Stinson, DO Attending Physician Faculty Practice, [facility]  

## 2014-01-03 ENCOUNTER — Inpatient Hospital Stay (HOSPITAL_COMMUNITY): Admission: RE | Admit: 2014-01-03 | Payer: Medicaid Other | Source: Ambulatory Visit

## 2014-01-04 ENCOUNTER — Encounter (HOSPITAL_COMMUNITY): Payer: Self-pay | Admitting: *Deleted

## 2014-04-08 ENCOUNTER — Encounter (HOSPITAL_COMMUNITY): Payer: Self-pay | Admitting: Emergency Medicine

## 2014-04-08 ENCOUNTER — Emergency Department (HOSPITAL_COMMUNITY)
Admission: EM | Admit: 2014-04-08 | Discharge: 2014-04-08 | Disposition: A | Discharge status: Home / Self Care | Payer: Medicaid Other | Source: Home / Self Care | Attending: Family Medicine | Admitting: Family Medicine

## 2014-04-08 DIAGNOSIS — A084 Viral intestinal infection, unspecified: Secondary | ICD-10-CM

## 2014-04-08 MED ORDER — PROMETHAZINE HCL 25 MG PO TABS: 25.0000 mg | ORAL_TABLET | Freq: Four times a day (QID) | ORAL | Status: DC | PRN

## 2014-04-08 MED ORDER — ONDANSETRON HCL 4 MG/2ML IJ SOLN
INTRAMUSCULAR | Status: AC
  Filled 2014-04-08: qty 2

## 2014-04-08 MED ORDER — ONDANSETRON HCL 4 MG/2ML IJ SOLN
4.0000 mg | Freq: Once | INTRAMUSCULAR | Status: AC
Start: 2014-04-08 — End: 2014-04-08
  Administered 2014-04-08: 4 mg via INTRAMUSCULAR

## 2014-04-08 NOTE — ED Provider Notes (Signed)
Tasha Wu is a 24 y.o. female who presents to Urgent Care today for nausea vomiting and diarrhea starting today. Her daughter was diagnosed with viral gastroenteritis yesterday. Patient has not tried any medications yet. No abdominal pain fevers or chills.   Past Medical History  Diagnosis Date  . Infection     Yeast;would get frequently  . Infection     BV x 1  . Kidney infection 2010    PO antibxs  . Migraines     no rx'd meds in the past;goes to sleep  . Umbilical hernia 1991    Does not cause anyp problems  . Emotional instability     Break down easily when things are not going her way  . Anxiety 2010    anxiety attack;no meds;has not been dx'd  . Hx of joint problems     Shoulders and knees have pain  . Abnormal Pap smear 12/10/2011  . GERD (gastroesophageal reflux disease)   . Vaginal Pap smear, abnormal   . Sickle cell trait   . Kidney stone complicating pregnancy    Past Surgical History  Procedure Laterality Date  . Dilation and curettage of uterus  2009    No complications  . Wisdom tooth extraction     History  Substance Use Topics  . Smoking status: Former Smoker -- 0.25 packs/day    Types: Cigarettes    Quit date: 11/18/2011  . Smokeless tobacco: Never Used  . Alcohol Use: No     Comment: Has stopped since pregnancy   ROS as above Medications: Current Facility-Administered Medications  Medication Dose Route Frequency Provider Last Rate Last Dose  . ondansetron (ZOFRAN) injection 4 mg  4 mg Intramuscular Once Rodolph BongEvan S Dawnna Gritz, MD       Current Outpatient Prescriptions  Medication Sig Dispense Refill  . ibuprofen (ADVIL,MOTRIN) 600 MG tablet Take 1 tablet (600 mg total) by mouth every 6 (six) hours. 30 tablet 0  . oxyCODONE-acetaminophen (PERCOCET/ROXICET) 5-325 MG per tablet Take 1 tablet by mouth every 4 (four) hours as needed (for pain scale less than 7). 10 tablet 0  . Prenatal Vit-Fe Fumarate-FA (PRENATAL MULTIVITAMIN) TABS tablet Take 1 tablet by  mouth daily at 12 noon.     No Known Allergies   Exam:  BP 95/65 mmHg  Pulse 112  Temp(Src) 98.2 F (36.8 C) (Oral)  Resp 20  SpO2 100%  LMP   Breastfeeding? Unknown Gen: Well NAD HEENT: EOMI,  MMM Lungs: Normal work of breathing. CTABL Heart: Mild tachycardia no MRG Abd: NABS, Soft. Nondistended, Nontender no rebound or guarding Exts: Brisk capillary refill, warm and well perfused.   Patient was given 4 mg of IM Zofran, and felt better  No results found for this or any previous visit (from the past 24 hour(s)). No results found.  Assessment and Plan: 24 y.o. female with viral gastroenteritis. Mildly dehydrated. Treat with Phenergan and oral rehydration. Return as needed.  Discussed warning signs or symptoms. Please see discharge instructions. Patient expresses understanding.     Rodolph BongEvan S Geoff Dacanay, MD 04/08/14 2010

## 2014-04-08 NOTE — ED Notes (Signed)
Reports acute on set today around 10 a.m  of vomiting and diarrhea.  Chills.  Burning sensation in stomach.  No changes in diet.   States "daughter just getting over same symptoms".  No otc treatments tried.

## 2014-04-08 NOTE — Discharge Instructions (Signed)
Thank you for coming in today. If your belly pain worsens, or you have high fever, bad vomiting, blood in your stool or black tarry stool go to the Emergency Room.   Viral Gastroenteritis Viral gastroenteritis is also known as stomach flu. This condition affects the stomach and intestinal tract. It can cause sudden diarrhea and vomiting. The illness typically lasts 3 to 8 days. Most people develop an immune response that eventually gets rid of the virus. While this natural response develops, the virus can make you quite ill. CAUSES  Many different viruses can cause gastroenteritis, such as rotavirus or noroviruses. You can catch one of these viruses by consuming contaminated food or water. You may also catch a virus by sharing utensils or other personal items with an infected person or by touching a contaminated surface. SYMPTOMS  The most common symptoms are diarrhea and vomiting. These problems can cause a severe loss of body fluids (dehydration) and a body salt (electrolyte) imbalance. Other symptoms may include:  Fever.  Headache.  Fatigue.  Abdominal pain. DIAGNOSIS  Your caregiver can usually diagnose viral gastroenteritis based on your symptoms and a physical exam. A stool sample may also be taken to test for the presence of viruses or other infections. TREATMENT  This illness typically goes away on its own. Treatments are aimed at rehydration. The most serious cases of viral gastroenteritis involve vomiting so severely that you are not able to keep fluids down. In these cases, fluids must be given through an intravenous line (IV). HOME CARE INSTRUCTIONS   Drink enough fluids to keep your urine clear or pale yellow. Drink small amounts of fluids frequently and increase the amounts as tolerated.  Ask your caregiver for specific rehydration instructions.  Avoid:  Foods high in sugar.  Alcohol.  Carbonated drinks.  Tobacco.  Juice.  Caffeine drinks.  Extremely hot or cold  fluids.  Fatty, greasy foods.  Too much intake of anything at one time.  Dairy products until 24 to 48 hours after diarrhea stops.  You may consume probiotics. Probiotics are active cultures of beneficial bacteria. They may lessen the amount and number of diarrheal stools in adults. Probiotics can be found in yogurt with active cultures and in supplements.  Wash your hands well to avoid spreading the virus.  Only take over-the-counter or prescription medicines for pain, discomfort, or fever as directed by your caregiver. Do not give aspirin to children. Antidiarrheal medicines are not recommended.  Ask your caregiver if you should continue to take your regular prescribed and over-the-counter medicines.  Keep all follow-up appointments as directed by your caregiver. SEEK IMMEDIATE MEDICAL CARE IF:   You are unable to keep fluids down.  You do not urinate at least once every 6 to 8 hours.  You develop shortness of breath.  You notice blood in your stool or vomit. This may look like coffee grounds.  You have abdominal pain that increases or is concentrated in one small area (localized).  You have persistent vomiting or diarrhea.  You have a fever.  The patient is a child younger than 3 months, and he or she has a fever.  The patient is a child older than 3 months, and he or she has a fever and persistent symptoms.  The patient is a child older than 3 months, and he or she has a fever and symptoms suddenly get worse.  The patient is a baby, and he or she has no tears when crying. MAKE SURE YOU:     Understand these instructions.  Will watch your condition.  Will get help right away if you are not doing well or get worse. Document Released: 02/19/2005 Document Revised: 05/14/2011 Document Reviewed: 12/06/2010 [employer] Patient Information 2015 [employer]. This information is not intended to replace advice given to you by your health care provider. Make sure you discuss  any questions you have with your health care provider.  

## 2014-09-03 ENCOUNTER — Emergency Department (HOSPITAL_COMMUNITY)
Admission: EM | Admit: 2014-09-03 | Discharge: 2014-09-03 | Disposition: A | Discharge status: Home / Self Care | Payer: Medicaid Other | Attending: Emergency Medicine | Admitting: Emergency Medicine

## 2014-09-03 ENCOUNTER — Encounter (HOSPITAL_COMMUNITY): Payer: Self-pay | Admitting: *Deleted

## 2014-09-03 DIAGNOSIS — Z8781 Personal history of (healed) traumatic fracture: Secondary | ICD-10-CM | POA: Insufficient documentation

## 2014-09-03 DIAGNOSIS — K088 Other specified disorders of teeth and supporting structures: Secondary | ICD-10-CM | POA: Diagnosis not present

## 2014-09-03 DIAGNOSIS — Z8619 Personal history of other infectious and parasitic diseases: Secondary | ICD-10-CM | POA: Insufficient documentation

## 2014-09-03 DIAGNOSIS — Z87448 Personal history of other diseases of urinary system: Secondary | ICD-10-CM | POA: Insufficient documentation

## 2014-09-03 DIAGNOSIS — Z8659 Personal history of other mental and behavioral disorders: Secondary | ICD-10-CM | POA: Insufficient documentation

## 2014-09-03 DIAGNOSIS — Z862 Personal history of diseases of the blood and blood-forming organs and certain disorders involving the immune mechanism: Secondary | ICD-10-CM | POA: Diagnosis not present

## 2014-09-03 DIAGNOSIS — Z87442 Personal history of urinary calculi: Secondary | ICD-10-CM | POA: Diagnosis not present

## 2014-09-03 DIAGNOSIS — Z8719 Personal history of other diseases of the digestive system: Secondary | ICD-10-CM | POA: Diagnosis not present

## 2014-09-03 DIAGNOSIS — Z8679 Personal history of other diseases of the circulatory system: Secondary | ICD-10-CM | POA: Diagnosis not present

## 2014-09-03 DIAGNOSIS — Z87891 Personal history of nicotine dependence: Secondary | ICD-10-CM | POA: Insufficient documentation

## 2014-09-03 DIAGNOSIS — K0889 Other specified disorders of teeth and supporting structures: Secondary | ICD-10-CM

## 2014-09-03 MED ORDER — IBUPROFEN 600 MG PO TABS: 600.0000 mg | ORAL_TABLET | Freq: Four times a day (QID) | ORAL | Status: DC | PRN

## 2014-09-03 NOTE — ED Provider Notes (Signed)
CSN: [id number]     Arrival date & time 09/03/14  1253 History  This chart was scribed for non-physician practitioner, Eyvonne Mechanic, PA-C, working with No att. providers found by Bethel Born, ED Scribe. This patient was seen in room TR09C/TR09C and the patient's care was started at 1:17 PM.     Chief Complaint  Patient presents with  . Dental Pain   The history is provided by the patient. No language interpreter was used.   HPI Comments: Tasha Wu is a 24 y.o. female who presents to the Emergency Department complaining of constant upper right dental pain with sudden onset one month ago after a dental fracture. Last night the patient fractured a tooth in the upper left portion of her mouth that is also causing  pain. She rates the pain 10/10 in severity at worst. The pain is worse at night and exacerbated by eating, heat, and cold. Tylenol 500 mg, Advil 200 mg, Aleve 220 mg, and Amersol provided insufficient pain relief PTA.  Pt denies fever, chills, nausea, vomiting, and neck stiffness. She last saw a dentist for a wisdom tooth extraction 1 year ago and will be unable to see another dentist for 1 month.   Past Medical History  Diagnosis Date  . Infection     Yeast;would get frequently  . Infection     BV x 1  . Kidney infection 2010    PO antibxs  . Migraines     no rx'd meds in the past;goes to sleep  . Umbilical hernia 1991    Does not cause anyp problems  . Emotional instability     Break down easily when things are not going her way  . Anxiety 2010    anxiety attack;no meds;has not been dx'd  . Hx of joint problems     Shoulders and knees have pain  . Abnormal Pap smear 12/10/2011  . GERD (gastroesophageal reflux disease)   . Vaginal Pap smear, abnormal   . Sickle cell trait   . Kidney stone complicating pregnancy    Past Surgical History  Procedure Laterality Date  . Dilation and curettage of uterus  2009    No complications  . Wisdom tooth extraction      Family History  Problem Relation Age of Onset  . Sickle cell trait Mother   . Sickle cell trait Brother   . Sickle cell anemia Maternal Aunt     Recently deceased from [facility] crisis  . Sickle cell anemia Maternal Grandmother     Deceased  . Heart disease Maternal Aunt   . Asthma Sister     1/2 sibling;father's child  . Hyperthyroidism Paternal Grandmother   . Migraines Father   . Hernia Maternal Aunt     Umbilical  . Hypertension Mother   . Hypertension Maternal Uncle   . Anemia Mother   . Diabetes Maternal Aunt   . Diabetes type I Cousin     maternal  . Diabetes Paternal Grandfather   . Scoliosis Paternal Grandmother   . Arthritis Paternal Grandmother   . Other Father     Joint Problems;shoulders and knees   History  Substance Use Topics  . Smoking status: Former Smoker -- 0.25 packs/day    Types: Cigarettes    Quit date: 11/18/2011  . Smokeless tobacco: Never Used  . Alcohol Use: No     Comment: Has stopped since pregnancy   OB History    Gravida Para Term Preterm AB TAB SAB Ectopic Multiple  Living   3 2 2  1     2      Review of Systems  All other systems reviewed and are negative.   Allergies  Review of patient's allergies indicates no known allergies.  Home Medications   Prior to Admission medications   Medication Sig Start Date End Date Taking? Authorizing Provider  ibuprofen (ADVIL,MOTRIN) 600 MG tablet Take 1 tablet (600 mg total) by mouth every 6 (six) hours as needed. 09/03/14   Eyvonne Mechanic, PA-C  Prenatal Vit-Fe Fumarate-FA (PRENATAL MULTIVITAMIN) TABS tablet Take 1 tablet by mouth daily at 12 noon.    Historical Provider, MD  promethazine (PHENERGAN) 25 MG tablet Take 1 tablet (25 mg total) by mouth every 6 (six) hours as needed for nausea or vomiting. 04/08/14   Rodolph Bong, MD   Triage Vitals: BP 109/76 mmHg  Pulse 86  Temp(Src) 98.2 F (36.8 C) (Oral)  Resp 16  SpO2 100% Physical Exam  Constitutional: She is oriented to person, place, and  time. She appears well-developed and well-nourished. No distress.  HENT:  Head: Normocephalic and atraumatic.  Mouth/Throat: Uvula is midline, oropharynx is clear and moist and mucous membranes are normal. No oropharyngeal exudate, posterior oropharyngeal edema, posterior oropharyngeal erythema or tonsillar abscesses.  Third molars removed at top bilaterally Missing first molar on left and right top Cavities with fractured tooth on second top molar Cavities on 2nd bicupid upper right in front of third molar No signs of swelling or infection No signs of abscess along the top or bottom gumlines On visual inspection of the face -Jaw line symmetric bilaterally   Eyes: Conjunctivae and EOM are normal.  Neck: Normal range of motion. Neck supple. No tracheal deviation present.  Cardiovascular: Normal rate.   Pulmonary/Chest: Effort normal. No respiratory distress.  Musculoskeletal: Normal range of motion.  Neurological: She is alert and oriented to person, place, and time.  Skin: Skin is warm and dry.  Psychiatric: She has a normal mood and affect. Her behavior is normal.  Nursing note and vitals reviewed.   ED Course  Procedures  NERVE BLOCK Performed by: Thermon Leyland Consent: Verbal consent obtained. Required items: required blood products, implants, devices, and special equipment available Time out: Immediately prior to procedure a "time out" was called to verify the correct patient, procedure, equipment, support staff and site/side marked as required.  Indication: Dental pain   Nerve block body site: buccal  nerve upper right   Needle gauge: 24 G  Location technique: anatomical landmarks  Local anesthetic: Bupivacaine   Anesthetic total: 1.5 ml  Outcome: pain improved Patient tolerance: Patient tolerated the procedure well with no immediate complications. 1:29 PM Nerve Block  DIAGNOSTIC STUDIES: Oxygen Saturation is 100% on RA, normal by my interpretation.     COORDINATION OF CARE: 1:18 PM Discussed treatment plan which includes a dental block and all risks and benefits for pain management with pt at bedside and pt agreed to plan.  1:50 PM I re-evaluated the patient after the dental block. Her pain has resolved completely.   Labs Review Labs Reviewed - No data to display  Imaging Review No results found.   EKG Interpretation None      MDM   Final diagnoses:  Pain, dental    Labs  Imaging:   Consults   Therapeutics: Bupivacaine  Assessment: Dental pain  Plan: She presents with dental pain. This is nonacute, no signs of infection, vital signs stable. Dental block produced complete resolution of pain  on the right upper teeth. No comp locations. Patient was given referral information for dentist, encouraged follow-up with him. Should return precautions given, verbalized understanding and agreement.   I personally performed the services described in this documentation, which was scribed in my presence. The recorded information has been reviewed and is accurate.   Eyvonne MechanicJeffrey Martika Egler, PA-C 09/03/14 1431  Nelva Nayobert Beaton, MD 09/07/14 [phone]

## 2014-09-03 NOTE — ED Notes (Addendum)
Pt presents via POV c/o upper BL dental pain x 1 month.  Reports having a dentist but cannot be seen until later this month.  Pt a x 4, NAD.  Denies fever.  Reports taking tylenol and aleve without relief.

## 2014-09-03 NOTE — Discharge Instructions (Signed)
°Emergency Department Resource Guide °1) Find a Doctor and Pay Out of Pocket °Although you won't have to find out who is covered by your insurance plan, it is a good idea to ask around and get recommendations. You will then need to call the office and see if the doctor you have chosen will accept you as a new patient and what types of options they offer for patients who are self-pay. Some doctors offer discounts or will set up payment plans for their patients who do not have insurance, but you will need to ask so you aren't surprised when you get to your appointment. ° °2) Contact Your Local Health Department °Not all health departments have doctors that can see patients for sick visits, but many do, so it is worth a call to see if yours does. If you don't know where your local health department is, you can check in your phone book. The CDC also has a tool to help you locate your state's health department, and many state websites also have listings of all of their local health departments. ° °3) Find a Walk-in Clinic °If your illness is not likely to be very severe or complicated, you may want to try a walk in clinic. These are popping up all over the country in pharmacies, drugstores, and shopping centers. They're usually staffed by nurse practitioners or physician assistants that have been trained to treat common illnesses and complaints. They're usually fairly quick and inexpensive. However, if you have serious medical issues or chronic medical problems, these are probably not your best option. ° °No Primary Care Doctor: °- Call Health Connect at  832-8000 - they can help you locate a primary care doctor that  accepts your insurance, provides certain services, etc. °- Physician Referral Service- 1-800-533-3463 ° °Chronic Pain Problems: °Organization         Address  Phone   Notes  °[facility]  (336) 297-2271 Patients need to be referred by their primary care doctor.  ° °Medication  Assistance: °Organization         Address  Phone   Notes  °[place] [facility] [address] °[place], [place] [address] (336) 641-8030 --Must be a resident of [place] °-- Must have NO insurance coverage whatsoever (no Medicaid/ Medicare, etc.) °-- The pt. MUST have a primary care doctor that directs their care regularly and follows them in the community °  °[facility]  (866) 331-1348   °[employer]  (888) 892-1162   ° °Agencies that provide inexpensive medical care: °Organization         Address  Phone   Notes  °[facility]  (336) 832-8035   °[facility]    (336) 832-7272   °[facility] [address] °[place], [place] [address] (336) 832-4777   °[facility] [address], °[place] (336) 271-4999   °[facility]    (336) 373-0678   °[facility]    (336) 272-1050   °[facility] ° [address], [place] Phone:  (336) 832-4444, Fax:  (336) 832-4440 Hours of Operation:  9 am - 6 pm, M-F.  Also accepts Medicaid/Medicare and self-pay.  °[facility] ° [address], [place] Phone: (336) 832-3150, Fax: (336) [phone]. Hours of Operation:  8:30 am - 5:30 pm, M-F.  Also accepts Medicaid and self-pay.  °[facility] [place] [address], [place] Phone: (336) 878-6027   °[facility] [address], [place], [place] (336)723-1848, Ext. 123 Mondays & Thursdays: 7-9 AM.  First 15 patients are seen on a first come, first serve basis. °  ° °Medicaid-accepting [place] Providers: ° °Organization         Address  Phone   Notes  °[facility] [address], [place] (336) 641-2100 Also accepts self-pay patients.  °[facility] [address], [place] ° (336) 856-9996   °[facility] [address], [place]  (336) 288-8857   °[facility] [address] [place] [address], [place] (336) 299-7000   °Veita Bland [address], [place]  ° (336) 373-1557 Only accepts [place] Access Medicaid patients after they have their name applied to their card.  ° °Self-Pay (no insurance) in [place]: ° °Organization         Address  Phone   Notes  °Sickle Cell Patients, [facility] [address], [place] (336) 832-1970   °[facility] [address], [place] (336) 832-4400   °[facility] ° [address], [place] (336) 992-4800   °[facility]/Dr. Osei-Bonsu ° [address] [place] [address], [place] or [address], [place] (336) 841-8500 Phone number for both [place] and [place] locations is the same.  °[facility] [address], [place] (336) 299-0000   °[facility] [address] [place] [address], [place] or [address] (336) 852-7530 °(336) 878-2260   °[facility] [address], [place] (336) 350-1642, phone; (336) 294-5005, fax Sees patients 1st and 3rd Saturday of every month.  Must not qualify for public or private insurance (i.e. Medicaid, Medicare, [place] Health Choice, Veterans' Benefits) • Household income should be no more than 200% of the poverty level •The clinic cannot treat you if you are pregnant or think you are pregnant • Sexually transmitted diseases are not treated at the clinic.  ° ° °Dental Care: °Organization         Address  Phone  Notes  °[place] [facility] [address], [place] (336) 641-6152 Accepts children up to age 21 who are enrolled in Medicaid or [place] Health Choice; pregnant women with a Medicaid card; and children who have applied for Medicaid or [place] Health Choice, but were declined, whose parents can pay a reduced fee at time of service.  °[place]  [facility] [place]  [address], [place] (336) 641-7733 Accepts children up to age 21 who are enrolled in Medicaid or [place] Health Choice; pregnant women with a Medicaid card; and children who have applied for Medicaid or [place] Health Choice, but were declined, whose parents can pay a reduced fee at time of service.  °[facility] ° [address], [place] (336) 641-4533 Patients are seen by appointment only. Walk-ins are not accepted. [facility] will see patients 18 years of age and older. °Monday - Tuesday (8am-5pm) °Most Wednesdays (8:30-5pm) °$30 per visit, cash only  °[facility] ° [address], [place] (336) 641-4533 Patients are seen by appointment only. Walk-ins are not accepted. [facility] will see patients 18 years of age and older. °One   Wednesday Evening (Monthly: Volunteer Based).  $30 per visit, cash only  °[facility]  (919) 537-3737 for adults; Children under age 4, call Graduate Pediatric Dentistry at (919) [phone]. Children aged 4-14, please call (919) [phone] to request a pediatric application. ° Dental services are provided in all areas of dental care including fillings, crowns and bridges, complete and partial dentures, implants, gum treatment, root canals, and extractions. Preventive care is also provided. Treatment is provided to both adults and children. °Patients are selected via a lottery and there is often a waiting list. °  °[facility] [address], °[place] ° (336) 763-8833 [email or web address] °  °[facility] [address], [place], [place] (336)723-1848, Ext. 123 Second and Fourth Thursday of each month, opens at 6:30 AM; Clinic ends at 9 AM.  Patients are seen on a first-come first-served basis, and a limited number are seen during each clinic.  ° °[facility] ° [address], [place], [place] (336) 723-7904    Eligibility Requirements °You must have lived in [place], [place], or [place] counties for at least the last three months. °  You cannot be eligible for state or federal sponsored healthcare insurance, including Veterans Administration, Medicaid, or Medicare. °  You generally cannot be eligible for healthcare insurance through your employer.  °  How to apply: °Eligibility screenings are held every Tuesday and Wednesday afternoon from 1:00 pm until 4:00 pm. You do not need an appointment for the interview!  °[facility] [address], [place], [place] 336-631-2330   °[facility]  336-342-8273   °[facility]  336-703-3100   °[facility]  336-570-6415   ° °Behavioral Health Resources in the Community: °Intensive Outpatient Programs °Organization         Address  Phone  Notes  °[facility] [address], [place], [place] 336-878-6098   °[facility] [address], [place], [place] 336-832-9800   °[facility] [address], [place], [place] ° 336-882-2125   °[facility] [address],  °[place], [place] 1-800-853-5163 or 336-641-4981   °Substance Abuse Resources °Organization         Address  Phone  Notes  °[facility]  336-882-2125   °[facility]  336-784-9470   °[facility]  336-285-9073   °[facility]  336-845-3988   °Residential & Outpatient Substance Abuse Program  1-800-659-3381   °Psychological Services °Organization         Address  Phone  Notes  °[facility]  336- 832-9600   °[facility]  336- 378-7881   °[facility] [address], [place] 1-800-853-5163 or 336-641-4981   ° °Mobile Crisis Teams °Organization         Address  Phone  Notes  °[facility], Mobile Crisis Care Unit  1-877-626-1772   °Assertive °[facility] ° [address]  [place], [place] 336-834-9664   °Sharon DeEsch [address] °[place] 336-554-5454   ° °Self-Help/Support Groups °Organization         Address  Phone             Notes  °[facility] of  - variety of support groups  336- 373-1402 Call for more information  °Narcotics Anonymous (NA), [facility] [address], °[place]  2 meetings at this location  ° °  Residential Treatment Programs Organization         Address  Phone  Notes  [facility] [address],    [place]  1-610-960-45401-(425)525-5420   [facility]  [address], [place] [phone], [place], [place] [phone]   [facility] [address], [place] [phone] Admissions: 8am-3pm M-F  [facility] [address]    [place], [place] [phone]   [facility] [address], [place], [place] [phone]   [facility] [address]  [place], [place] [phone]   [facility] Intensive Outpatient [address], [place], [place] [phone]   [facility] ([facility] [address]  [place], [place] [phone] or ([phone]) [phone]   [facility] ([facility]) [address] [place], [place] [phone] Accepts Medicaid  [facility] [address]  [place] [phone] Substance Abuse/Addiction Treatment   [facility]         Address  Phone  Notes  [facility]  209-389-6420(888) [phone]   Angie FavaJulie Brannon, PhD [address], [address] [place], [place]   [phone](336) [phone] or [phone](336) ([phone]   [facility]   [address] [place], [place] [phone](336) (782) [phone]   [facility] [address], [place], [place] [phone](336) [phone] Insurance/Medicaid/sponsorship through [facility] [address]                                    [place], [place] [phone](336) [phone] Therapy/tele-psych/case    [facility] [address]   [place], [place] [phone](336) [phone]    Dr. Lolly MustacheArfeen  [phone](336) [phone]   [facility]  [employer] [facility] 1) [address],  2) [address], [place] 3)  [address], [place] [phone](336) ([phone](336) [phone]  [phone](336) [phone]   [facility] Child [facility] Hot[facility] 402-766-1543(336) [phone] or [phone](336) (971) [phone] (After Hours)      Dental Pain A tooth ache may be caused by cavities (tooth decay). Cavities expose the nerve of the tooth to air and hot or cold temperatures. It may come from an infection or abscess (also called a boil or furuncle) around your tooth. It is also often caused by dental caries (tooth decay). This causes the pain you are having. DIAGNOSIS  Your caregiver can diagnose this problem by exam. TREATMENT   If caused by an infection, it may be treated with medications which kill germs (antibiotics) and pain medications as prescribed by your caregiver. Take medications as directed.  Only take over-the-counter or prescription medicines for pain, discomfort, or fever as directed by your caregiver.  Whether the tooth ache today is caused by infection or dental disease, you should see your dentist as soon as possible for further care. SEEK MEDICAL CARE IF: The exam and treatment you received today has been provided on an emergency basis only. This is not a substitute for complete medical or dental care. If your problem worsens or new problems (symptoms) appear, and you are unable to meet with your dentist, call or return to this location. SEEK IMMEDIATE MEDICAL CARE IF:   You have a fever.  You develop redness and swelling of your face, jaw, or neck.  You are unable to open your mouth.  You have severe pain  uncontrolled by pain medicine. MAKE SURE YOU:   Understand these instructions.  Will watch your condition.  Will get help right away if you are not doing well or get worse. Document Released:  02/19/2005 Document Revised: 05/14/2011 Document Reviewed: 10/08/2007 [facility] Patient Information 2015 [place]. This information is not intended to replace advice given to you by your health care provider. Make sure you discuss any questions you have with your health care provider.   Please follow-up with attached information. Please use medication as directed.

## 2014-09-03 NOTE — ED Notes (Signed)
PA at bedside to do dental block.

## 2014-11-18 ENCOUNTER — Emergency Department (HOSPITAL_COMMUNITY)
Admission: EM | Admit: 2014-11-18 | Discharge: 2014-11-18 | Disposition: A | Discharge status: Home / Self Care | Payer: Medicaid Other | Attending: Emergency Medicine | Admitting: Emergency Medicine

## 2014-11-18 ENCOUNTER — Encounter (HOSPITAL_COMMUNITY): Payer: Self-pay | Admitting: *Deleted

## 2014-11-18 DIAGNOSIS — K029 Dental caries, unspecified: Secondary | ICD-10-CM | POA: Insufficient documentation

## 2014-11-18 DIAGNOSIS — K088 Other specified disorders of teeth and supporting structures: Secondary | ICD-10-CM | POA: Insufficient documentation

## 2014-11-18 DIAGNOSIS — Z8679 Personal history of other diseases of the circulatory system: Secondary | ICD-10-CM | POA: Insufficient documentation

## 2014-11-18 DIAGNOSIS — Z8659 Personal history of other mental and behavioral disorders: Secondary | ICD-10-CM | POA: Insufficient documentation

## 2014-11-18 DIAGNOSIS — Z87442 Personal history of urinary calculi: Secondary | ICD-10-CM | POA: Diagnosis not present

## 2014-11-18 DIAGNOSIS — Z87448 Personal history of other diseases of urinary system: Secondary | ICD-10-CM | POA: Diagnosis not present

## 2014-11-18 DIAGNOSIS — Z8619 Personal history of other infectious and parasitic diseases: Secondary | ICD-10-CM | POA: Insufficient documentation

## 2014-11-18 DIAGNOSIS — K0889 Other specified disorders of teeth and supporting structures: Secondary | ICD-10-CM

## 2014-11-18 DIAGNOSIS — Z87891 Personal history of nicotine dependence: Secondary | ICD-10-CM | POA: Diagnosis not present

## 2014-11-18 DIAGNOSIS — Z862 Personal history of diseases of the blood and blood-forming organs and certain disorders involving the immune mechanism: Secondary | ICD-10-CM | POA: Diagnosis not present

## 2014-11-18 MED ORDER — KETOROLAC TROMETHAMINE 60 MG/2ML IM SOLN
60.0000 mg | Freq: Once | INTRAMUSCULAR | Status: AC
  Administered 2014-11-18: 60 mg via INTRAMUSCULAR
  Filled 2014-11-18: qty 2

## 2014-11-18 MED ORDER — HYDROCODONE-ACETAMINOPHEN 5-325 MG PO TABS
2.0000 | ORAL_TABLET | Freq: Once | ORAL | Status: AC
  Administered 2014-11-18: 2 via ORAL
  Filled 2014-11-18: qty 2

## 2014-11-18 NOTE — Discharge Instructions (Signed)
Dental Pain Ms. Yanko, take ibuprofen 800 mg every 8 hours as needed for pain control. See your dentist within 3 days for close follow-up. If any symptoms worsen come back to emergency department immediately. Thank you. Toothache is pain in or around a tooth. It may get worse with chewing or with cold or heat.  HOME CARE  Your dentist may use a numbing medicine during treatment. If so, you may need to avoid eating until the medicine wears off. Ask your dentist about this.  Only take medicine as told by your dentist or doctor.  Avoid chewing food near the painful tooth until after all treatment is done. Ask your dentist about this. GET HELP RIGHT AWAY IF:   The problem gets worse or new problems appear.  You have a fever.  There is redness and puffiness (swelling) of the face, jaw, or neck.  You cannot open your mouth.  There is pain in the jaw.  There is very bad pain that is not helped by medicine. MAKE SURE YOU:   Understand these instructions.  Will watch your condition.  Will get help right away if you are not doing well or get worse. Document Released: 08/08/2007 Document Revised: 05/14/2011 Document Reviewed: 08/08/2007 [facility] Patient Information 2015 [place], [place]. This information is not intended to replace advice given to you by your health care provider. Make sure you discuss any questions you have with your health care provider.

## 2014-11-18 NOTE — ED Provider Notes (Signed)
CSN: [id number]     Arrival date & time 11/18/14  0111 History  This chart was scribed for Tomasita Crumble, MD by Lyndel Safe, ED Scribe. This patient was seen in room B17C/B17C and the patient's care was started 2:59 AM.   Chief Complaint  Patient presents with  . Dental Pain    HPI  HPI Comments: Tasha Wu is a 24 y.o. female who presents to the Emergency Department complaining of intermittent, top, left-sided dental pain that has been present for 4 months but that progressively worsened 5 hours ago to a constant, severe pain prompting ED evaluation. Pt states she has a dental appointment in October. She has taken several OTC pain medication with mild to no relief. Denies drainage from the affected area.   Past Medical History  Diagnosis Date  . Infection     Yeast;would get frequently  . Infection     BV x 1  . Kidney infection 2010    PO antibxs  . Migraines     no rx'd meds in the past;goes to sleep  . Umbilical hernia 1991    Does not cause anyp problems  . Emotional instability     Break down easily when things are not going her way  . Anxiety 2010    anxiety attack;no meds;has not been dx'd  . Hx of joint problems     Shoulders and knees have pain  . Abnormal Pap smear 12/10/2011  . GERD (gastroesophageal reflux disease)   . Vaginal Pap smear, abnormal   . Sickle cell trait   . Kidney stone complicating pregnancy    Past Surgical History  Procedure Laterality Date  . Dilation and curettage of uterus  2009    No complications  . Wisdom tooth extraction     Family History  Problem Relation Age of Onset  . Sickle cell trait Mother   . Sickle cell trait Brother   . Sickle cell anemia Maternal Aunt     Recently deceased from [facility] crisis  . Sickle cell anemia Maternal Grandmother     Deceased  . Heart disease Maternal Aunt   . Asthma Sister     1/2 sibling;father's child  . Hyperthyroidism Paternal Grandmother   . Migraines Father   . Hernia Maternal Aunt      Umbilical  . Hypertension Mother   . Hypertension Maternal Uncle   . Anemia Mother   . Diabetes Maternal Aunt   . Diabetes type I Cousin     maternal  . Diabetes Paternal Grandfather   . Scoliosis Paternal Grandmother   . Arthritis Paternal Grandmother   . Other Father     Joint Problems;shoulders and knees   Social History  Substance Use Topics  . Smoking status: Former Smoker -- 0.25 packs/day    Types: Cigarettes    Quit date: 11/18/2011  . Smokeless tobacco: Never Used  . Alcohol Use: No     Comment: Has stopped since pregnancy   OB History    Gravida Para Term Preterm AB TAB SAB Ectopic Multiple Living   3 2 2  1     2      Review of Systems 10 Systems reviewed and are negative for acute change except as noted in the HPI.  Allergies  Review of patient's allergies indicates no known allergies.  Home Medications   Prior to Admission medications   Medication Sig Start Date End Date Taking? Authorizing Provider  ibuprofen (ADVIL,MOTRIN) 600 MG tablet Take 1 tablet (  600 mg total) by mouth every 6 (six) hours as needed. 09/03/14   Eyvonne Mechanic, PA-C  Prenatal Vit-Fe Fumarate-FA (PRENATAL MULTIVITAMIN) TABS tablet Take 1 tablet by mouth daily at 12 noon.    Historical Provider, MD  promethazine (PHENERGAN) 25 MG tablet Take 1 tablet (25 mg total) by mouth every 6 (six) hours as needed for nausea or vomiting. 04/08/14   Rodolph Bong, MD   BP 125/83 mmHg  Pulse 99  Temp(Src) 98.3 F (36.8 C) (Oral)  Resp 18  Ht  (1.702 m)  Wt 119 lb (53.978 kg)  BMI 18.63 kg/m2  SpO2 98%  Breastfeeding? No Physical Exam  Constitutional: She is oriented to person, place, and time. She appears well-developed and well-nourished. No distress.  HENT:  Head: Normocephalic and atraumatic.  Nose: Nose normal.  Mouth/Throat: Oropharynx is clear and moist. No oropharyngeal exudate.  No soft tissue swelling or TTP in the mouth, multiple caries notes.   Eyes: Conjunctivae and EOM are  normal. Pupils are equal, round, and reactive to light. No scleral icterus.  Neck: Normal range of motion. Neck supple. No JVD present. No tracheal deviation present. No thyromegaly present.  Cardiovascular: Normal rate, regular rhythm and normal heart sounds.  Exam reveals no gallop and no friction rub.   No murmur heard. Pulmonary/Chest: Effort normal and breath sounds normal. No respiratory distress. She has no wheezes. She exhibits no tenderness.  Abdominal: Soft. Bowel sounds are normal. She exhibits no distension and no mass. There is no tenderness. There is no rebound and no guarding.  Musculoskeletal: Normal range of motion. She exhibits no edema or tenderness.  Lymphadenopathy:    She has no cervical adenopathy.  Neurological: She is alert and oriented to person, place, and time. No cranial nerve deficit. She exhibits normal muscle tone.  Skin: Skin is warm and dry. No rash noted. No erythema. No pallor.  Nursing note and vitals reviewed.   ED Course  Procedures  DIAGNOSTIC STUDIES: Oxygen Saturation is 98% on RA, normal by my interpretation.    COORDINATION OF CARE: 3:04 AM Discussed treatment plan with pt. Pt acknowledges and agrees to plan.   Labs Review Labs Reviewed - No data to display  Imaging Review No results found. I have personally reviewed and evaluated these images and lab results as part of my medical decision-making.   EKG Interpretation None      MDM   Final diagnoses:  None    Patient presents emergency department for severe dental pain. She hasn't appointment on October 6 for a root canal. Her pain suddenly got worse earlier today. She will be given Toradol and Norco for pain control emergency department. Physical exam does not show any abscess formation. She does have multiple caries with fillings in place. Patient is advised to continue Tylenol or ibuprofen at home as needed and try to get an earlier point in with her dentist. She otherwise appears  well and in no acute distress, vital signs remain within her normal limits and she is safe for discharge.   I personally performed the services described in this documentation, which was scribed in my presence. The recorded information has been reviewed and is accurate.   Tomasita Crumble, MD 11/18/14 [phone]

## 2014-11-18 NOTE — ED Notes (Signed)
Patient presents with c/o dental pain to the top left.  Has an appointment Oct 6 for a root canal

## 2015-11-08 ENCOUNTER — Encounter (HOSPITAL_COMMUNITY): Payer: Self-pay | Admitting: Obstetrics and Gynecology

## 2016-03-16 DIAGNOSIS — K0889 Other specified disorders of teeth and supporting structures: Secondary | ICD-10-CM | POA: Insufficient documentation

## 2016-03-16 DIAGNOSIS — Z79899 Other long term (current) drug therapy: Secondary | ICD-10-CM | POA: Insufficient documentation

## 2016-03-16 DIAGNOSIS — Z7982 Long term (current) use of aspirin: Secondary | ICD-10-CM | POA: Insufficient documentation

## 2016-03-16 DIAGNOSIS — Z87891 Personal history of nicotine dependence: Secondary | ICD-10-CM | POA: Insufficient documentation

## 2016-03-17 ENCOUNTER — Encounter (HOSPITAL_COMMUNITY): Payer: Self-pay | Admitting: Family Medicine

## 2016-03-17 ENCOUNTER — Emergency Department (HOSPITAL_COMMUNITY)
Admission: EM | Admit: 2016-03-17 | Discharge: 2016-03-17 | Disposition: A | Discharge status: Home / Self Care | Payer: 59 | Attending: Emergency Medicine | Admitting: Emergency Medicine

## 2016-03-17 DIAGNOSIS — K0889 Other specified disorders of teeth and supporting structures: Secondary | ICD-10-CM

## 2016-03-17 MED ORDER — HYDROCODONE-ACETAMINOPHEN 5-325 MG PO TABS
2.0000 | ORAL_TABLET | Freq: Once | ORAL | Status: AC
  Administered 2016-03-17: 2 via ORAL
  Filled 2016-03-17: qty 2

## 2016-03-17 MED ORDER — NAPROXEN 500 MG PO TABS: 500.0000 mg | ORAL_TABLET | Freq: Two times a day (BID) | ORAL | 0 refills | Status: DC | PRN

## 2016-03-17 MED ORDER — AMOXICILLIN 500 MG PO CAPS: 500.0000 mg | ORAL_CAPSULE | Freq: Three times a day (TID) | ORAL | 0 refills | Status: DC

## 2016-03-17 NOTE — ED Triage Notes (Signed)
Patient reports she is experiencing dental pain (right upper). Pt reports she went to her dentist on Thursday and not given prescription for pain medication or antibiotic. Pt has an appt with oral surgeon on Tueday. Took IBUPROFEN 800mg  at 22:30.

## 2016-03-17 NOTE — ED Provider Notes (Signed)
[facility]-EMERGENCY DEPT Provider Note   CSN: [id number] Arrival date & time: 03/16/16  2343  By signing my name below, I, Alyssa Grove, attest that this documentation has been prepared under the direction and in the presence of Blane Ohara, MD. Electronically Signed: Alyssa Grove, ED Scribe. 03/17/16. 2:51 AM.   History   Chief Complaint Chief Complaint  Patient presents with  . Dental Pain   The history is provided by the patient. No language interpreter was used.   HPI Comments: Tasha Wu is a 26 y.o. female who presents to the Emergency Department complaining of gradual onset, progressively worsening, constant, moderate right upper dental pain for 2 days. She currently has a cavity and has an extraction scheduled for 03/20/2016. Pt has prior history of cavities. She has taken Ibuprofen (last dose at 10:30 PM tonight) with no significant relief to pain. She reports associated nausea and chills. Pt denies vomiting and fever.  Past Medical History:  Diagnosis Date  . Abnormal Pap smear 12/10/2011  . Anxiety 2010   anxiety attack;no meds;has not been dx'd  . Emotional instability    Break down easily when things are not going her way  . GERD (gastroesophageal reflux disease)   . Hx of joint problems    Shoulders and knees have pain  . Infection    Yeast;would get frequently  . Infection    BV x 1  . Kidney infection 2010   PO antibxs  . Kidney stone complicating pregnancy   . Migraines    no rx'd meds in the past;goes to sleep  . Sickle cell trait (HCC)   . Umbilical hernia 1991   Does not cause anyp problems  . Vaginal Pap smear, abnormal     Patient Active Problem List   Diagnosis Date Noted  . Active labor at term 12/28/2013  . Post term pregnancy, antepartum condition or complication 06/18/2012  . Abnormal Pap smear 12/10/2011  . Umbilical hernia 11/29/2011  . H/O pyelonephritis 11/29/2011    Past Surgical History:  Procedure Laterality Date  . DILATION  AND CURETTAGE OF UTERUS  2009   No complications  . WISDOM TOOTH EXTRACTION      OB History    Gravida Para Term Preterm AB Living   3 2 2   1 2    SAB TAB Ectopic Multiple Live Births           2       Home Medications    Prior to Admission medications   Medication Sig Start Date End Date Taking? Authorizing Provider  Aspirin-Acetaminophen-Caffeine (GOODY HEADACHE PO) Take 1 packet by mouth every 6 (six) hours as needed (for headache).    Historical Provider, MD    Family History Family History  Problem Relation Age of Onset  . Sickle cell trait Mother   . Hypertension Mother   . Anemia Mother   . Sickle cell trait Brother   . Sickle cell anemia Maternal Aunt     Recently deceased from [facility] crisis  . Sickle cell anemia Maternal Grandmother     Deceased  . Heart disease Maternal Aunt   . Asthma Sister     1/2 sibling;father's child  . Hyperthyroidism Paternal Grandmother   . Scoliosis Paternal Grandmother   . Arthritis Paternal Grandmother   . Migraines Father   . Other Father     Joint Problems;shoulders and knees  . Hernia Maternal Aunt     Umbilical  . Hypertension Maternal Uncle   .  Diabetes Maternal Aunt   . Diabetes type I Cousin     maternal  . Diabetes Paternal Grandfather     Social History Social History  Substance Use Topics  . Smoking status: Former Smoker    Packs/day: 0.25    Types: Cigarettes    Quit date: 11/18/2011  . Smokeless tobacco: Never Used  . Alcohol use No     Allergies   Patient has no known allergies.   Review of Systems Review of Systems  Constitutional: Positive for chills. Negative for fever.  HENT: Positive for dental problem.   Gastrointestinal: Positive for nausea. Negative for vomiting.  All other systems reviewed and are negative.    Physical Exam Updated Vital Signs BP 121/82 (BP Location: Left Arm)   Pulse 85   Temp 98.3 F (36.8 C) (Oral)   Resp 18   Ht 5\' 7"  (1.702 m)   Wt 124 lb (56.2 kg)   LMP  03/14/2016   SpO2 100%   BMI 19.42 kg/m   Physical Exam  Constitutional: She is oriented to person, place, and time. She appears well-developed and well-nourished. She is active. No distress.  HENT:  Head: Normocephalic and atraumatic.  No trismus Fractured tooth (3rd last molar on right upper) No fluctuance or large abscess appreciated Moist mucous membrane  Eyes: Conjunctivae are normal.  Cardiovascular: Normal rate.   Pulmonary/Chest: Effort normal. No respiratory distress.  Musculoskeletal: Normal range of motion.  Neurological: She is alert and oriented to person, place, and time.  Skin: Skin is warm and dry.  Psychiatric: She has a normal mood and affect. Her behavior is normal.  Nursing note and vitals reviewed.  ED Treatments / Results  DIAGNOSTIC STUDIES: Oxygen Saturation is 100% on RA, normal by my interpretation.    COORDINATION OF CARE: 2:41 AM Discussed treatment plan with pt at bedside which includes Vicodin and pt agreed to plan.  Labs (all labs ordered are listed, but only abnormal results are displayed) Labs Reviewed - No data to display  EKG  EKG Interpretation None       Radiology No results found.  Procedures Procedures (including critical care time)  Medications Ordered in ED Medications - No data to display   Initial Impression / Assessment and Plan / ED Course  I have reviewed the triage vital signs and the nursing notes.  Pertinent labs & imaging results that were available during my care of the patient were reviewed by me and considered in my medical decision making (see chart for details).  Clinical Course    Patient presents with dental pain concern for apical abscess with worsening pain. Patient has dental follow-up. No sinus significant infection.  Results and differential diagnosis were discussed with the patient/parent/guardian. Xrays were independently reviewed by myself.  Close follow up outpatient was discussed,  comfortable with the plan.   Medications  HYDROcodone-acetaminophen (NORCO/VICODIN) 5-325 MG per tablet 2 tablet (2 tablets Oral Given 03/17/16 0245)    Vitals:   03/17/16 0043 03/17/16 0044  BP: 121/82   Pulse: 85   Resp: 18   Temp: 98.3 F (36.8 C)   TempSrc: Oral   SpO2: 100%   Weight:  124 lb (56.2 kg)  Height:  5\' 7"  (1.702 m)    Final diagnoses:  Pain, dental     Final Clinical Impressions(s) / ED Diagnoses   Final diagnoses:  None    New Prescriptions New Prescriptions   No medications on file      [place]ine Lakes AdditionJoshua  Jodi Mourning, MD 03/17/16 [phone]

## 2016-03-17 NOTE — Discharge Instructions (Signed)
Take antibiotics as discussed. Follow-up with her dentist this week.  If you were given medicines take as directed.  If you are on coumadin or contraceptives realize their levels and effectiveness is altered by many different medicines.  If you have any reaction (rash, tongues swelling, other) to the medicines stop taking and see a physician.    If your blood pressure was elevated in the ER make sure you follow up for management with a primary doctor or return for chest pain, shortness of breath or stroke symptoms.  Please follow up as directed and return to the ER or see a physician for new or worsening symptoms.  Thank you. Vitals:   03/17/16 0043 03/17/16 0044  BP: 121/82   Pulse: 85   Resp: 18   Temp: 98.3 F (36.8 C)   TempSrc: Oral   SpO2: 100%   Weight:  124 lb (56.2 kg)  Height:  5\' 7"  (1.702 m)

## 2016-06-05 ENCOUNTER — Encounter (HOSPITAL_COMMUNITY): Payer: Self-pay | Admitting: *Deleted

## 2016-06-05 ENCOUNTER — Ambulatory Visit (HOSPITAL_COMMUNITY)
Admission: EM | Admit: 2016-06-05 | Discharge: 2016-06-05 | Disposition: A | Discharge status: Home / Self Care | Payer: 59 | Attending: Family Medicine | Admitting: Family Medicine

## 2016-06-05 DIAGNOSIS — J40 Bronchitis, not specified as acute or chronic: Secondary | ICD-10-CM | POA: Diagnosis not present

## 2016-06-05 HISTORY — DX: Polycystic ovarian syndrome: E28.2

## 2016-06-05 MED ORDER — PREDNISONE 50 MG PO TABS: ORAL_TABLET | ORAL | 0 refills | Status: DC

## 2016-06-05 MED ORDER — AZITHROMYCIN 250 MG PO TABS: 250.0000 mg | ORAL_TABLET | Freq: Every day | ORAL | 0 refills | Status: DC

## 2016-06-05 MED ORDER — BENZONATATE 100 MG PO CAPS: 100.0000 mg | ORAL_CAPSULE | Freq: Three times a day (TID) | ORAL | 0 refills | Status: DC

## 2016-06-05 NOTE — Discharge Instructions (Signed)
I am treating you for bronchitis. I have prescribed Azithromycin. Take 2 tablets today, then 1 tablet daily till finished. I have also prescribed a steroid called prednisone. Take one tablet daily with food. For cough, I have prescribed a medication called Tessalon. Take 1 tablet every 8 hours as needed for your cough.   I would also recommend for the remainder of allergy season, to take a daily antihistamine such as Claritin, Allegra, Zyrtec, or Xyzal.   Should your symptoms fail to improve or worsen, follow up with your primary care provider, or return to clinic.

## 2016-06-05 NOTE — ED Provider Notes (Signed)
CSN: [id number]     Arrival date & time 06/05/16  1628 History   First MD Initiated Contact with Patient 06/05/16 1657     Chief Complaint  Patient presents with  . Cough   (Consider location/radiation/quality/duration/timing/severity/associated sxs/prior Treatment) 2 week history of cough, hoarseness, worsening congestion over last two days. Denies any fever, muscle aches, bodyaches, malaise, has had fatigue.   The history is provided by the patient.  Cough  Cough characteristics:  Non-productive, dry, harsh and hacking Sputum characteristics:  Manson Passey Severity:  Moderate Onset quality:  Gradual Duration:  2 weeks Timing:  Constant Progression:  Worsening Chronicity:  New Smoker: no   Context: exposure to allergens and weather changes   Context: not animal exposure, not fumes, not sick contacts, not smoke exposure and not upper respiratory infection   Relieved by:  None tried Worsened by:  Environmental changes and deep breathing Ineffective treatments:  None tried Associated symptoms: rhinorrhea and sinus congestion   Associated symptoms: no chest pain, no chills, no ear fullness, no eye discharge, no fever, no headaches, no rash, no shortness of breath, no sore throat and no wheezing     Past Medical History:  Diagnosis Date  . Abnormal Pap smear 12/10/2011  . Anxiety 2010   anxiety attack;no meds;has not been dx'd  . Emotional instability    Break down easily when things are not going her way  . GERD (gastroesophageal reflux disease)   . Hx of joint problems    Shoulders and knees have pain  . Infection    Yeast;would get frequently  . Infection    BV x 1  . Kidney infection 2010   PO antibxs  . Kidney stone complicating pregnancy   . Migraines    no rx'd meds in the past;goes to sleep  . PCOS (polycystic ovarian syndrome)   . Sickle cell trait (HCC)   . Umbilical hernia 1991   Does not cause anyp problems  . Vaginal Pap smear, abnormal    Past Surgical  History:  Procedure Laterality Date  . DILATION AND CURETTAGE OF UTERUS  2009   No complications  . WISDOM TOOTH EXTRACTION     Family History  Problem Relation Age of Onset  . Sickle cell trait Mother   . Hypertension Mother   . Anemia Mother   . Sickle cell trait Brother   . Sickle cell anemia Maternal Aunt     Recently deceased from [facility] crisis  . Sickle cell anemia Maternal Grandmother     Deceased  . Heart disease Maternal Aunt   . Asthma Sister     1/2 sibling;father's child  . Hyperthyroidism Paternal Grandmother   . Scoliosis Paternal Grandmother   . Arthritis Paternal Grandmother   . Migraines Father   . Other Father     Joint Problems;shoulders and knees  . Hernia Maternal Aunt     Umbilical  . Hypertension Maternal Uncle   . Diabetes Maternal Aunt   . Diabetes type I Cousin     maternal  . Diabetes Paternal Grandfather    Social History  Substance Use Topics  . Smoking status: Former Smoker    Packs/day: 0.25    Types: Cigarettes    Quit date: 11/18/2011  . Smokeless tobacco: Never Used  . Alcohol use No   OB History    Gravida Para Term Preterm AB Living   SAB TAB Ectopic Multiple Live Births  2     Review of Systems  Constitutional: Negative for chills and fever.  HENT: Positive for congestion and rhinorrhea. Negative for postnasal drip, sinus pain, sinus pressure and sore throat.   Eyes: Negative for discharge and itching.  Respiratory: Positive for cough. Negative for shortness of breath and wheezing.   Cardiovascular: Negative for chest pain and palpitations.  Gastrointestinal: Negative for abdominal pain, constipation, nausea and vomiting.  Genitourinary: Negative for dysuria and frequency.  Musculoskeletal: Negative for neck pain and neck stiffness.  Skin: Negative for pallor and rash.  Neurological: Negative for dizziness and headaches.    Allergies  Patient has no known allergies.  Home Medications   Prior to  Admission medications   Medication Sig Start Date End Date Taking? Authorizing Provider  METFORMIN HCL PO Take by mouth.   Yes Historical Provider, MD  Aspirin-Acetaminophen-Caffeine (GOODY HEADACHE PO) Take 1 packet by mouth every 6 (six) hours as needed (for headache).    Historical Provider, MD  azithromycin (ZITHROMAX) 250 MG tablet Take 1 tablet (250 mg total) by mouth daily. Take first 2 tablets together, then 1 every day until finished. 06/05/16   Dorena Bodo, NP  benzonatate (TESSALON) 100 MG capsule Take 1 capsule (100 mg total) by mouth every 8 (eight) hours. 06/05/16   Dorena Bodo, NP  predniSONE (DELTASONE) 50 MG tablet Take 1 tablet daily with food 06/05/16   Dorena Bodo, NP   Meds Ordered and Administered this Visit  Medications - No data to display  BP 111/74 (BP Location: Right Arm)   Pulse 78   Temp 98.4 F (36.9 C) (Oral)   Resp 12   LMP 05/10/2016 Comment: irreg  due  to  pcos  SpO2 100%  No data found.   Physical Exam  Constitutional: She is oriented to person, place, and time. She appears well-developed and well-nourished. She does not have a sickly appearance. She does not appear ill. No distress.  HENT:  Head: Normocephalic and atraumatic.  Right Ear: Tympanic membrane and external ear normal.  Left Ear: Tympanic membrane and external ear normal.  Nose: Nose normal. Right sinus exhibits no maxillary sinus tenderness and no frontal sinus tenderness. Left sinus exhibits no maxillary sinus tenderness and no frontal sinus tenderness.  Mouth/Throat: Uvula is midline, oropharynx is clear and moist and mucous membranes are normal. Tonsils are 0 on the right. Tonsils are 0 on the left. No tonsillar exudate.  Eyes: Pupils are equal, round, and reactive to light.  Neck: Normal range of motion. Neck supple.  Cardiovascular: Normal rate and regular rhythm.   Pulmonary/Chest: Effort normal and breath sounds normal.  Abdominal: Soft. Bowel sounds are normal.   Lymphadenopathy:       Head (right side): No submental, no submandibular, no tonsillar and no preauricular adenopathy present.       Head (left side): No submental, no submandibular, no tonsillar and no preauricular adenopathy present.    She has no cervical adenopathy.  Neurological: She is alert and oriented to person, place, and time.  Skin: Skin is warm and dry. Capillary refill takes less than 2 seconds. She is not diaphoretic.  Psychiatric: She has a normal mood and affect. Her behavior is normal.  Nursing note and vitals reviewed.   Urgent Care Course     Procedures (including critical care time)  Labs Review Labs Reviewed - No data to display  Imaging Review No results found.     MDM   1. Bronchitis  Treating for acute bronchitis, given azithromycin, prednisone, and Tessalon. Encouraged over-the-counter antihistamines daily, symptoms persist past 2 weeks return to clinic, or follow-up with primary care.    Dorena Bodo, NP 06/05/16 1820

## 2016-06-05 NOTE — ED Triage Notes (Signed)
Cough   And  Phlegm  Production       X  2   Weeks   And  Noticed    Pt  Had  Body  Aches  This   Am            Hoarse   As   Well

## 2016-06-29 ENCOUNTER — Encounter (HOSPITAL_COMMUNITY): Payer: Self-pay | Admitting: Family Medicine

## 2016-06-29 ENCOUNTER — Ambulatory Visit (HOSPITAL_COMMUNITY)
Admission: EM | Admit: 2016-06-29 | Discharge: 2016-06-29 | Disposition: A | Discharge status: Home / Self Care | Payer: 59 | Attending: Emergency Medicine | Admitting: Emergency Medicine

## 2016-06-29 DIAGNOSIS — R05 Cough: Secondary | ICD-10-CM | POA: Diagnosis not present

## 2016-06-29 DIAGNOSIS — R059 Cough, unspecified: Secondary | ICD-10-CM

## 2016-06-29 DIAGNOSIS — J302 Other seasonal allergic rhinitis: Secondary | ICD-10-CM | POA: Diagnosis not present

## 2016-06-29 MED ORDER — DEXTROMETHORPHAN HBR 15 MG/5ML PO SYRP: 10.0000 mL | ORAL_SOLUTION | Freq: Four times a day (QID) | ORAL | 0 refills | Status: DC | PRN

## 2016-06-29 NOTE — Discharge Instructions (Signed)
Try taking zyrtec in the evenings  Stay hydrated Use one or the other for cough meds

## 2016-06-29 NOTE — ED Provider Notes (Signed)
CSN: [id number]     Arrival date & time 06/29/16  1246 History   First MD Initiated Contact with Patient 06/29/16 1300     Chief Complaint  Patient presents with  . Cough   (Consider location/radiation/quality/duration/timing/severity/associated sxs/prior Treatment) HPI  Past Medical History:  Diagnosis Date  . Abnormal Pap smear 12/10/2011  . Anxiety 2010   anxiety attack;no meds;has not been dx'd  . Emotional instability    Break down easily when things are not going her way  . GERD (gastroesophageal reflux disease)   . Hx of joint problems    Shoulders and knees have pain  . Infection    Yeast;would get frequently  . Infection    BV x 1  . Kidney infection 2010   PO antibxs  . Kidney stone complicating pregnancy   . Migraines    no rx'd meds in the past;goes to sleep  . PCOS (polycystic ovarian syndrome)   . Sickle cell trait (HCC)   . Umbilical hernia 1991   Does not cause anyp problems  . Vaginal Pap smear, abnormal    Past Surgical History:  Procedure Laterality Date  . DILATION AND CURETTAGE OF UTERUS  2009   No complications  . WISDOM TOOTH EXTRACTION     Family History  Problem Relation Age of Onset  . Sickle cell trait Mother   . Hypertension Mother   . Anemia Mother   . Sickle cell trait Brother   . Sickle cell anemia Maternal Aunt     Recently deceased from [facility] crisis  . Sickle cell anemia Maternal Grandmother     Deceased  . Heart disease Maternal Aunt   . Asthma Sister     1/2 sibling;father's child  . Hyperthyroidism Paternal Grandmother   . Scoliosis Paternal Grandmother   . Arthritis Paternal Grandmother   . Migraines Father   . Other Father     Joint Problems;shoulders and knees  . Hernia Maternal Aunt     Umbilical  . Hypertension Maternal Uncle   . Diabetes Maternal Aunt   . Diabetes type I Cousin     maternal  . Diabetes Paternal Grandfather    Social History  Substance Use Topics  . Smoking status: Former Smoker    Packs/day:  0.25    Types: Cigarettes    Quit date: 11/18/2011  . Smokeless tobacco: Never Used  . Alcohol use No   OB History    Gravida Para Term Preterm AB Living   SAB TAB Ectopic Multiple Live Births           2     Review of Systems  Constitutional: Negative.   HENT: Positive for sore throat.   Eyes: Negative.   Respiratory: Positive for cough.   Genitourinary: Negative.     Allergies  Patient has no known allergies.  Home Medications   Prior to Admission medications   Medication Sig Start Date End Date Taking? Authorizing Provider  Aspirin-Acetaminophen-Caffeine (GOODY HEADACHE PO) Take 1 packet by mouth every 6 (six) hours as needed (for headache).    Historical Provider, MD  azithromycin (ZITHROMAX) 250 MG tablet Take 1 tablet (250 mg total) by mouth daily. Take first 2 tablets together, then 1 every day until finished. 06/05/16   Dorena Bodo, NP  benzonatate (TESSALON) 100 MG capsule Take 1 capsule (100 mg total) by mouth every 8 (eight) hours. 06/05/16   Dorena Bodo, NP  dextromethorphan 15 MG/5ML  syrup Take 10 mLs (30 mg total) by mouth 4 (four) times daily as needed for cough. 06/29/16   Tobi Bastos, NP  METFORMIN HCL PO Take by mouth.    Historical Provider, MD  predniSONE (DELTASONE) 50 MG tablet Take 1 tablet daily with food 06/05/16   Dorena Bodo, NP   Meds Ordered and Administered this Visit  Medications - No data to display  BP 107/70   Pulse (!) 102   Temp 98.5 F (36.9 C)   Resp 18   SpO2 100%  No data found.   Physical Exam  Constitutional: She appears well-developed.  HENT:  Head: Normocephalic.  Eyes: Pupils are equal, round, and reactive to light.  Neck: Normal range of motion.  Throat sore hoarseness, no erythema, no pustula's.   Cardiovascular: Normal rate.   Pulmonary/Chest: Effort normal.  Non productive cough feels, clear   Neurological: She is alert.  Skin: Skin is warm.    Urgent Care Course     Procedures  (including critical care time)  Labs Review Labs Reviewed - No data to display  Imaging Review No results found.           MDM   1. Cough   2. Seasonal allergic rhinitis, unspecified trigger    Try taking zyrtec in the evenings  Stay hydrated Use one or the other for cough meds You asked for a referral for an ENT you can call them for an appoint      Tobi Bastos, NP 06/29/16 1325

## 2016-06-29 NOTE — ED Triage Notes (Signed)
Pt here for cough and a hoarseness to voice. Same as 2 weeks ago and was seen here .

## 2016-07-04 ENCOUNTER — Encounter (HOSPITAL_COMMUNITY): Payer: Self-pay

## 2016-07-04 DIAGNOSIS — A084 Viral intestinal infection, unspecified: Secondary | ICD-10-CM | POA: Diagnosis not present

## 2016-07-04 DIAGNOSIS — Z7982 Long term (current) use of aspirin: Secondary | ICD-10-CM | POA: Diagnosis not present

## 2016-07-04 DIAGNOSIS — Z7984 Long term (current) use of oral hypoglycemic drugs: Secondary | ICD-10-CM | POA: Insufficient documentation

## 2016-07-04 DIAGNOSIS — Z87891 Personal history of nicotine dependence: Secondary | ICD-10-CM | POA: Insufficient documentation

## 2016-07-04 DIAGNOSIS — R1110 Vomiting, unspecified: Secondary | ICD-10-CM | POA: Diagnosis present

## 2016-07-04 LAB — URINALYSIS, ROUTINE W REFLEX MICROSCOPIC
Bilirubin Urine: NEGATIVE
Glucose, UA: NEGATIVE mg/dL
Hgb urine dipstick: NEGATIVE
Ketones, ur: NEGATIVE mg/dL
Nitrite: NEGATIVE
PH: 5 (ref 5.0–8.0)
Protein, ur: NEGATIVE mg/dL
SPECIFIC GRAVITY, URINE: 1.02 (ref 1.005–1.030)

## 2016-07-04 LAB — COMPREHENSIVE METABOLIC PANEL
ALT: 10 U/L — ABNORMAL LOW (ref 14–54)
AST: 26 U/L (ref 15–41)
Albumin: 3.7 g/dL (ref 3.5–5.0)
Alkaline Phosphatase: 48 U/L (ref 38–126)
Anion gap: 10 (ref 5–15)
BUN: 10 mg/dL (ref 6–20)
CHLORIDE: 104 mmol/L (ref 101–111)
CO2: 21 mmol/L — ABNORMAL LOW (ref 22–32)
CREATININE: 1.11 mg/dL — AB (ref 0.44–1.00)
Calcium: 8.7 mg/dL — ABNORMAL LOW (ref 8.9–10.3)
GFR calc non Af Amer: >60 mL/min (ref >60)
Glucose, Bld: 116 mg/dL — ABNORMAL HIGH (ref 65–99)
Potassium: 3.1 mmol/L — ABNORMAL LOW (ref 3.5–5.1)
SODIUM: 135 mmol/L (ref 135–145)
Total Bilirubin: 0.9 mg/dL (ref 0.3–1.2)
Total Protein: 7.3 g/dL (ref 6.5–8.1)

## 2016-07-04 LAB — POC URINE PREG, ED: Preg Test, Ur: NEGATIVE

## 2016-07-04 LAB — CBC
HCT: 36.9 % (ref 36.0–46.0)
Hemoglobin: 12.4 g/dL (ref 12.0–15.0)
MCH: 26.2 pg (ref 26.0–34.0)
MCHC: 33.6 g/dL (ref 30.0–36.0)
MCV: 78 fL (ref 78.0–100.0)
PLATELETS: 189 10*3/uL (ref 150–400)
RBC: 4.73 MIL/uL (ref 3.87–5.11)
RDW: 13.6 % (ref 11.5–15.5)
WBC: 4.9 10*3/uL (ref 4.0–10.5)

## 2016-07-04 LAB — LIPASE, BLOOD: Lipase: 21 U/L (ref 11–51)

## 2016-07-04 MED ORDER — ONDANSETRON 4 MG PO TBDP
ORAL_TABLET | ORAL | Status: AC
  Filled 2016-07-04: qty 1

## 2016-07-04 MED ORDER — ONDANSETRON 4 MG PO TBDP
4.0000 mg | ORAL_TABLET | Freq: Once | ORAL | Status: AC | PRN
  Administered 2016-07-04: 4 mg via ORAL

## 2016-07-04 NOTE — ED Triage Notes (Signed)
Pt states this morning around 2am she begin to have n/v/d; pt states she has generalized body aches at 6/10 on arrival. Pt states multiple times of v/d; Pt a&ox 4 on arrival; pt is tachycardic at triage at 121.

## 2016-07-05 ENCOUNTER — Emergency Department (HOSPITAL_COMMUNITY)
Admission: EM | Admit: 2016-07-05 | Discharge: 2016-07-05 | Disposition: A | Discharge status: Home / Self Care | Payer: 59 | Attending: Emergency Medicine | Admitting: Emergency Medicine

## 2016-07-05 DIAGNOSIS — A084 Viral intestinal infection, unspecified: Secondary | ICD-10-CM

## 2016-07-05 MED ORDER — SODIUM CHLORIDE 0.9 % IV BOLUS (SEPSIS)
1000.0000 mL | Freq: Once | INTRAVENOUS | Status: AC
  Administered 2016-07-05: 1000 mL via INTRAVENOUS

## 2016-07-05 MED ORDER — ONDANSETRON 4 MG PO TBDP: 4.0000 mg | ORAL_TABLET | Freq: Three times a day (TID) | ORAL | 0 refills | Status: DC | PRN

## 2016-07-05 MED ORDER — POTASSIUM CHLORIDE CRYS ER 20 MEQ PO TBCR
40.0000 meq | EXTENDED_RELEASE_TABLET | Freq: Once | ORAL | Status: AC
  Administered 2016-07-05: 40 meq via ORAL
  Filled 2016-07-05: qty 2

## 2016-07-05 MED ORDER — ONDANSETRON HCL 4 MG/2ML IJ SOLN
4.0000 mg | Freq: Once | INTRAMUSCULAR | Status: AC
  Administered 2016-07-05: 4 mg via INTRAVENOUS
  Filled 2016-07-05: qty 2

## 2016-07-05 NOTE — Discharge Instructions (Signed)
Please read and follow all provided instructions.  Your diagnoses today include:  1. Viral gastroenteritis     Tests performed today include: Blood counts and electrolytes Blood tests to check liver and kidney function Blood tests to check pancreas function Urine test to look for infection and pregnancy (in women) Vital signs. See below for your results today.   Medications prescribed:   Take any prescribed medications only as directed.  Home care instructions:  Follow any educational materials contained in this packet.  Follow-up instructions: Please follow-up with your primary care provider in the next 2 days for further evaluation of your symptoms.    Return instructions:  SEEK IMMEDIATE MEDICAL ATTENTION IF: The pain does not go away or becomes severe  A temperature above 101F develops  Repeated vomiting occurs (multiple episodes)  The pain becomes localized to portions of the abdomen. The right side could possibly be appendicitis. In an adult, the left lower portion of the abdomen could be colitis or diverticulitis.  Blood is being passed in stools or vomit (bright red or black tarry stools)  You develop chest pain, difficulty breathing, dizziness or fainting, or become confused, poorly responsive, or inconsolable (young children) If you have any other emergent concerns regarding your health  Additional Information: Abdominal (belly) pain can be caused by many things. Your caregiver performed an examination and possibly ordered blood/urine tests and imaging (CT scan, x-rays, ultrasound). Many cases can be observed and treated at home after initial evaluation in the emergency department. Even though you are being discharged home, abdominal pain can be unpredictable. Therefore, you need a repeated exam if your pain does not resolve, returns, or worsens. Most patients with abdominal pain don't have to be admitted to the hospital or have surgery, but serious problems like  appendicitis and gallbladder attacks can start out as nonspecific pain. Many abdominal conditions cannot be diagnosed in one visit, so follow-up evaluations are very important.  Your vital signs today were: BP 100/71 (BP Location: Left Arm)    Pulse 80    Temp 98.5 F (36.9 C) (Oral)    Resp 18    LMP 06/11/2016 (Exact Date)    SpO2 100%  If your blood pressure (bp) was elevated above 135/85 this visit, please have this repeated by your doctor within one month. --------------

## 2016-07-05 NOTE — ED Provider Notes (Signed)
[facility]-EMERGENCY DEPT Provider Note   CSN: [id number] Arrival date & time: 07/04/16  2019     History   Chief Complaint Chief Complaint  Patient presents with  . Emesis  . Diarrhea  . Generalized Body Aches    HPI Kaleeyah Cuffie is a 26 y.o. female.  HPI  26 y.o. female, presents to the Emergency Department today complaining of N/V/D this morning around 0200. Notes generalized body aches that she rates 6/10. Notes epigastric abdominal pain that is intermittent and feels like a cramping sensation. Hx same in past. Noted subjective fevres. No coughing or congestion. No URI symptoms. No sick contacts. No meds PTA. N other symptoms noted    Past Medical History:  Diagnosis Date  . Abnormal Pap smear 12/10/2011  . Anxiety 2010   anxiety attack;no meds;has not been dx'd  . Emotional instability    Break down easily when things are not going her way  . GERD (gastroesophageal reflux disease)   . Hx of joint problems    Shoulders and knees have pain  . Infection    Yeast;would get frequently  . Infection    BV x 1  . Kidney infection 2010   PO antibxs  . Kidney stone complicating pregnancy   . Migraines    no rx'd meds in the past;goes to sleep  . PCOS (polycystic ovarian syndrome)   . Sickle cell trait (HCC)   . Umbilical hernia 1991   Does not cause anyp problems  . Vaginal Pap smear, abnormal     Patient Active Problem List   Diagnosis Date Noted  . Active labor at term 12/28/2013  . Post term pregnancy, antepartum condition or complication 06/18/2012  . Abnormal Pap smear 12/10/2011  . Umbilical hernia 11/29/2011  . H/O pyelonephritis 11/29/2011    Past Surgical History:  Procedure Laterality Date  . DILATION AND CURETTAGE OF UTERUS  2009   No complications  . WISDOM TOOTH EXTRACTION      OB History    Gravida Para Term Preterm AB Living   3 2 2   1 2    SAB TAB Ectopic Multiple Live Births           2       Home Medications    Prior to Admission  medications   Medication Sig Start Date End Date Taking? Authorizing Provider  Aspirin-Acetaminophen-Caffeine (GOODY HEADACHE PO) Take 1 packet by mouth every 6 (six) hours as needed (for headache).    Historical Provider, MD  azithromycin (ZITHROMAX) 250 MG tablet Take 1 tablet (250 mg total) by mouth daily. Take first 2 tablets together, then 1 every day until finished. 06/05/16   Dorena Bodo, NP  benzonatate (TESSALON) 100 MG capsule Take 1 capsule (100 mg total) by mouth every 8 (eight) hours. 06/05/16   Dorena Bodo, NP  dextromethorphan 15 MG/5ML syrup Take 10 mLs (30 mg total) by mouth 4 (four) times daily as needed for cough. 06/29/16   Tobi Bastos, NP  METFORMIN HCL PO Take by mouth.    Historical Provider, MD  predniSONE (DELTASONE) 50 MG tablet Take 1 tablet daily with food 06/05/16   Dorena Bodo, NP    Family History Family History  Problem Relation Age of Onset  . Sickle cell trait Mother   . Hypertension Mother   . Anemia Mother   . Sickle cell trait Brother   . Sickle cell anemia Maternal Aunt     Recently deceased from [facility] crisis  .  Sickle cell anemia Maternal Grandmother     Deceased  . Heart disease Maternal Aunt   . Asthma Sister     1/2 sibling;father's child  . Hyperthyroidism Paternal Grandmother   . Scoliosis Paternal Grandmother   . Arthritis Paternal Grandmother   . Migraines Father   . Other Father     Joint Problems;shoulders and knees  . Hernia Maternal Aunt     Umbilical  . Hypertension Maternal Uncle   . Diabetes Maternal Aunt   . Diabetes type I Cousin     maternal  . Diabetes Paternal Grandfather     Social History Social History  Substance Use Topics  . Smoking status: Former Smoker    Packs/day: 0.25    Types: Cigarettes    Quit date: 11/18/2011  . Smokeless tobacco: Never Used  . Alcohol use No     Allergies   Patient has no known allergies.   Review of Systems Review of Systems ROS reviewed and all are negative  for acute change except as noted in the HPI.  Physical Exam Updated Vital Signs BP 103/66 (BP Location: Left Arm)   Pulse (!) 108   Temp 98.5 F (36.9 C) (Oral)   Resp 16   LMP 06/11/2016 (Exact Date)   SpO2 100%   Physical Exam  Constitutional: She is oriented to person, place, and time. Vital signs are normal. She appears well-developed and well-nourished.  HENT:  Head: Normocephalic and atraumatic.  Right Ear: Hearing normal.  Left Ear: Hearing normal.  Eyes: Conjunctivae and EOM are normal. Pupils are equal, round, and reactive to light.  Neck: Normal range of motion. Neck supple.  Cardiovascular: Normal rate, regular rhythm, normal heart sounds and intact distal pulses.   Pulmonary/Chest: Effort normal.  Abdominal: Soft. Normal appearance and bowel sounds are normal. There is tenderness in the epigastric area. There is no rigidity, no rebound, no guarding, no CVA tenderness, no tenderness at McBurney's point and negative Murphy's sign.  Musculoskeletal: Normal range of motion.  Neurological: She is alert and oriented to person, place, and time.  Skin: Skin is warm and dry.  Psychiatric: She has a normal mood and affect. Her speech is normal and behavior is normal. Thought content normal.  Nursing note and vitals reviewed.    ED Treatments / Results  Labs (all labs ordered are listed, but only abnormal results are displayed) Labs Reviewed  COMPREHENSIVE METABOLIC PANEL - Abnormal; Notable for the following:       Result Value   Potassium 3.1 (*)    CO2 21 (*)    Glucose, Bld 116 (*)    Creatinine, Ser 1.11 (*)    Calcium 8.7 (*)    ALT 10 (*)    All other components within normal limits  URINALYSIS, ROUTINE W REFLEX MICROSCOPIC - Abnormal; Notable for the following:    Leukocytes, UA TRACE (*)    Bacteria, UA RARE (*)    Squamous Epithelial / LPF 0-5 (*)    All other components within normal limits  LIPASE, BLOOD  CBC  POC URINE PREG, ED    EKG  EKG  Interpretation None       Radiology No results found.  Procedures Procedures (including critical care time)  Medications Ordered in ED Medications  ondansetron (ZOFRAN-ODT) disintegrating tablet 4 mg (4 mg Oral Given 07/04/16 2033)  sodium chloride 0.9 % bolus 1,000 mL (0 mLs Intravenous Stopped 07/05/16 0238)  ondansetron (ZOFRAN) injection 4 mg (4 mg Intravenous Given 07/05/16  0132)  potassium chloride SA (K-DUR,KLOR-CON) CR tablet 40 mEq (40 mEq Oral Given 07/05/16 0125)     Initial Impression / Assessment and Plan / ED Course  I have reviewed the triage vital signs and the nursing notes.  Pertinent labs & imaging results that were available during my care of the patient were reviewed by me and considered in my medical decision making (see chart for details).  Final Clinical Impressions(s) / ED Diagnoses  {I have reviewed and evaluated the relevant laboratory values.   {I have reviewed the relevant previous healthcare records.  {I obtained HPI from historian.   ED Course:  Assessment: Patient is a 26 y.o. female presents with abdominal pain x 24 hours. Notes N/V/D. Subjective fevers. On exam, nontoxic, nonseptic appearing, in no apparent distress. Patient's pain and other symptoms adequately managed in emergency department.  Fluid bolus given.  Labs and vitals reviewed.  Patient does not meet the SIRS or Sepsis criteria.  On repeat exam patient does not have a surgical abdomen and there are no peritoneal signs.  No indication of appendicitis, bowel obstruction, bowel perforation, cholecystitis, diverticulitis, PID or ectopic pregnancy. Likely viral gastroenteritis. Given Rx Zofran. Fluid challenge in ED with without difficulty. Patient discharged home with symptomatic treatment and given strict instructions for follow-up with their primary care physician.  I have also discussed reasons to return immediately to the ER.  Patient expresses understanding and agrees with  plan.  Disposition/Plan:  DC Home Additional Verbal discharge instructions given and discussed with patient.  Pt Instructed to f/u with PCP in the next week for evaluation and treatment of symptoms. Return precautions given Pt acknowledges and agrees with plan  Supervising Physician Mancel Bale, MD  Final diagnoses:  Viral gastroenteritis    New Prescriptions New Prescriptions   No medications on file     Audry Pili, PA-C 07/05/16 0300    Mancel Bale, MD 07/05/16 [phone]

## 2016-10-03 DIAGNOSIS — K219 Gastro-esophageal reflux disease without esophagitis: Secondary | ICD-10-CM | POA: Insufficient documentation

## 2016-10-03 DIAGNOSIS — F419 Anxiety disorder, unspecified: Secondary | ICD-10-CM | POA: Insufficient documentation

## 2016-10-03 DIAGNOSIS — E282 Polycystic ovarian syndrome: Secondary | ICD-10-CM | POA: Insufficient documentation

## 2016-10-03 DIAGNOSIS — G43109 Migraine with aura, not intractable, without status migrainosus: Secondary | ICD-10-CM | POA: Insufficient documentation

## 2017-03-05 NOTE — L&D Delivery Note (Signed)
Delivery Note At 7:38 AM a viable female was delivered via  (Presentation: LOA).  APGAR: 8, 9; weight.    Placenta status: delivered spontaneously and completely.  Cord: three vessel with the following complications: n/a.   Anesthesia:  Epidural Episiotomy:  None Lacerations:  None Suture Repair: n/a Est. Blood Loss (mL):  175  Mom to postpartum.  Baby to Couplet care / Skin to Skin.  Janeece RiggersEllis K Weslee Fogg 02/21/2018, 7:51 AM

## 2017-04-06 ENCOUNTER — Other Ambulatory Visit: Payer: Self-pay

## 2017-04-06 ENCOUNTER — Emergency Department (HOSPITAL_COMMUNITY)
Admission: EM | Admit: 2017-04-06 | Discharge: 2017-04-06 | Disposition: A | Discharge status: Home / Self Care | Payer: 59 | Attending: Emergency Medicine | Admitting: Emergency Medicine

## 2017-04-06 ENCOUNTER — Emergency Department (HOSPITAL_COMMUNITY): Payer: 59

## 2017-04-06 ENCOUNTER — Encounter (HOSPITAL_COMMUNITY): Payer: Self-pay

## 2017-04-06 DIAGNOSIS — S199XXA Unspecified injury of neck, initial encounter: Secondary | ICD-10-CM | POA: Diagnosis present

## 2017-04-06 DIAGNOSIS — Z87891 Personal history of nicotine dependence: Secondary | ICD-10-CM | POA: Insufficient documentation

## 2017-04-06 DIAGNOSIS — Y999 Unspecified external cause status: Secondary | ICD-10-CM | POA: Insufficient documentation

## 2017-04-06 DIAGNOSIS — Z79899 Other long term (current) drug therapy: Secondary | ICD-10-CM | POA: Diagnosis not present

## 2017-04-06 DIAGNOSIS — Y929 Unspecified place or not applicable: Secondary | ICD-10-CM | POA: Diagnosis not present

## 2017-04-06 DIAGNOSIS — M546 Pain in thoracic spine: Secondary | ICD-10-CM | POA: Diagnosis not present

## 2017-04-06 DIAGNOSIS — S161XXA Strain of muscle, fascia and tendon at neck level, initial encounter: Secondary | ICD-10-CM

## 2017-04-06 DIAGNOSIS — Y939 Activity, unspecified: Secondary | ICD-10-CM | POA: Diagnosis not present
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
## 2017-04-06 MED ORDER — IBUPROFEN 200 MG PO TABS
600.0000 mg | ORAL_TABLET | Freq: Once | ORAL | Status: AC
  Administered 2017-04-06: 600 mg via ORAL
  Filled 2017-04-06: qty 3

## 2017-04-06 MED ORDER — METHOCARBAMOL 500 MG PO TABS
500.0000 mg | ORAL_TABLET | Freq: Once | ORAL | Status: AC
Start: 2017-04-06 — End: 2017-04-06
  Administered 2017-04-06: 500 mg via ORAL
  Filled 2017-04-06: qty 1

## 2017-04-06 MED ORDER — METHOCARBAMOL 500 MG PO TABS: 500.0000 mg | ORAL_TABLET | Freq: Every evening | ORAL | 0 refills | Status: DC | PRN

## 2017-04-06 MED ORDER — IBUPROFEN 600 MG PO TABS: 600.0000 mg | ORAL_TABLET | Freq: Four times a day (QID) | ORAL | 0 refills | Status: DC | PRN
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
## 2017-04-06 NOTE — Discharge Instructions (Signed)
Take Ibuprofen three times daily for the next week. Take this medicine with food. °Take muscle relaxer at bedtime to help you sleep. This medicine makes you drowsy so do not take before driving or work °Use a heating pad for sore muscles - use for 20 minutes several times a day °Return for worsening symptoms ° °
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
## 2017-04-18 ENCOUNTER — Encounter: Payer: Self-pay | Admitting: Physician Assistant

## 2017-04-18 ENCOUNTER — Ambulatory Visit (INDEPENDENT_AMBULATORY_CARE_PROVIDER_SITE_OTHER): Payer: 59 | Admitting: Physician Assistant

## 2017-04-18 ENCOUNTER — Other Ambulatory Visit: Payer: Self-pay

## 2017-04-18 VITALS — BP 100/60 | HR 86 | Temp 98.8°F | Resp 18 | Ht 68.11 in | Wt 134.6 lb

## 2017-04-18 DIAGNOSIS — R0981 Nasal congestion: Secondary | ICD-10-CM

## 2017-04-18 DIAGNOSIS — J3489 Other specified disorders of nose and nasal sinuses: Secondary | ICD-10-CM

## 2017-04-18 DIAGNOSIS — R82998 Other abnormal findings in urine: Secondary | ICD-10-CM

## 2017-04-18 DIAGNOSIS — R300 Dysuria: Secondary | ICD-10-CM

## 2017-04-18 DIAGNOSIS — J019 Acute sinusitis, unspecified: Secondary | ICD-10-CM

## 2017-04-18 DIAGNOSIS — F411 Generalized anxiety disorder: Secondary | ICD-10-CM

## 2017-04-18 LAB — POCT URINALYSIS DIP (MANUAL ENTRY)
BILIRUBIN UA: NEGATIVE mg/dL
Bilirubin, UA: NEGATIVE
Glucose, UA: NEGATIVE mg/dL
Nitrite, UA: POSITIVE — AB
PH UA: 7.5 (ref 5.0–8.0)
Spec Grav, UA: 1.015 (ref 1.010–1.025)
Urobilinogen, UA: 1 E.U./dL

## 2017-04-18 MED ORDER — FLUTICASONE PROPIONATE 50 MCG/ACT NA SUSP: 2.0000 | Freq: Every day | NASAL | 0 refills | Status: DC

## 2017-04-18 MED ORDER — NITROFURANTOIN MONOHYD MACRO 100 MG PO CAPS: 100.0000 mg | ORAL_CAPSULE | Freq: Two times a day (BID) | ORAL | 0 refills | Status: AC

## 2017-04-18 MED ORDER — PSEUDOEPHEDRINE HCL ER 120 MG PO TB12: 120.0000 mg | ORAL_TABLET | Freq: Two times a day (BID) | ORAL | 0 refills | Status: DC

## 2017-04-18 NOTE — Progress Notes (Signed)
MRN: [id number] DOB: [birth year]  Subjective:   Tasha Wu is a 27 y.o. female presenting for chief complaint of Facial Pain; Headache; and Dysuria (X 1 week) . Reports 1.5 week history of dysuria. Has strong odor and suprapubic pressure. Denies urinary frequency, urinary estinyl, vaginal discharge, genital irritation, fever, and chills.  Tried azo pills with no full relief. She is sexually active with monogamous partner.  Would like STD testing today. LMP 03/20/17.  Also, reports 2 day history of sinus pressure, nasal congestion, sneezing, and postnasal drip. Denies fever, chills, ear pain, chest pain, sore throat, wheezing, and SOB. Has history of seasonal allergies, no history of asthma. Patient has not flu shot this season. Denies smoking. Denies any other aggravating or relieving factors, no other questions or concerns.  She has also been dealing with lots of stress lately. She has two kids and work has been very stressful. She feels like she has not been herself with the kids. She just wants to be alone. Has increased irritability, racing thoughts, and dysphoric mood. She crys often. She has dealt with this for a long time. Denies suicidal thoughts and ideations. Saw a therapist through work and felt lots better but did not go back and now feels down.  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  She is interested in seeing another therapist.  Tasha Wu has a current medication list which includes the following prescription(s): ibuprofen, methocarbamol, aspirin-acetaminophen-caffeine, azithromycin, benzonatate, dextromethorphan, fluticasone, metformin hcl, nitrofurantoin (macrocrystal-monohydrate), ondansetron, prednisone, and pseudoephedrine. Also has No Known Allergies.  Tasha Wu  has a past medical history of Abnormal Pap smear (12/10/2011), Anemia, Anxiety (2010), Emotional instability (HCC), GERD (gastroesophageal reflux disease),  joint problems, Infection, Infection, Kidney infection (2010), Kidney stone complicating pregnancy, Migraines, PCOS (polycystic ovarian syndrome), Sickle cell trait (HCC), Umbilical hernia (1991), and Vaginal Pap smear, abnormal. Also  has a past surgical history that includes Dilation and curettage of uterus (2009) and Wisdom tooth extraction.   Objective:   Vitals: BP 100/60 (BP Location: Left Arm, Patient Position: Sitting, Cuff Size: Normal)   Pulse 86   Temp 98.8 F (37.1 C) (Oral)   Resp 18   Ht 5' 8.11" (1.73 m)   Wt 134 lb 9.6 oz (61.1 kg)   LMP 03/20/2017 (Approximate)   SpO2 99%   BMI 20.40 kg/m   Physical Exam  Constitutional: She is oriented to person, place, and time. She appears well-developed and well-nourished. No distress.  HENT:  Head: Normocephalic and atraumatic.  Right Ear: Tympanic membrane, external ear and ear canal normal.  Left Ear: Tympanic membrane, external ear and ear canal normal.  Nose: Mucosal edema (moderate bilateral) present. Right sinus exhibits no maxillary sinus tenderness and no frontal sinus tenderness. Left sinus exhibits no maxillary sinus tenderness and no frontal sinus tenderness.  Mouth/Throat: Uvula is midline, oropharynx is clear and moist and mucous membranes are normal. No tonsillar exudate.  Eyes: Conjunctivae are normal.  Neck: Normal range of motion.  Cardiovascular: Normal rate, regular rhythm and normal heart sounds.  Pulmonary/Chest: Effort normal and breath sounds normal. She has no wheezes. She has no rhonchi. She has no rales.  Abdominal: Soft. Normal appearance and bowel sounds are normal. There is tenderness in the suprapubic area. There is no CVA tenderness.  Lymphadenopathy:       Head (right side): No submental, no submandibular, no tonsillar, no preauricular, no posterior auricular and no occipital adenopathy  present.       Head (left side): No submental, no submandibular, no tonsillar, no preauricular, no posterior  auricular and no occipital adenopathy present.    She has no cervical adenopathy.       Right: No supraclavicular adenopathy present.       Left: No supraclavicular adenopathy present.  Neurological: She is alert and oriented to person, place, and time.  Skin: Skin is warm and dry.  Psychiatric: She has a normal mood and affect.  Vitals reviewed.   Results for orders placed or performed in visit on 04/18/17 (from the past 24 hour(s))  POCT urinalysis dipstick     Status: Abnormal   Collection Time: 04/18/17 10:59 AM  Result Value Ref Range   Color, UA yellow yellow   Clarity, UA cloudy (A) clear   Glucose, UA negative negative mg/dL   Bilirubin, UA negative negative   Ketones, POC UA negative negative mg/dL   Spec Grav, UA [id number] [id number] - 1.025   Blood, UA trace-intact (A) negative   pH, UA 7.5 5.0 - 8.0   Protein Ur, POC trace (A) negative mg/dL   Urobilinogen, UA 1.0 0.2 or 1.0 E.U./dL   Nitrite, UA Positive (A) Negative   Leukocytes, UA Small (1+) (A) Negative    Depression screen [facility] 2/9 04/18/2017  Decreased Interest 3  Down, Depressed, Hopeless 2  PHQ - 2 Score 5  Altered sleeping 3  Tired, decreased energy 3  Change in appetite 3  Feeling bad or failure about yourself  3  Trouble concentrating 3  Moving slowly or fidgety/restless 1  Suicidal thoughts 0  PHQ-9 Score 21  Difficult doing work/chores Very difficult   GAD 7 : Generalized Anxiety Score 04/18/2017  Nervous, Anxious, on Edge 2  Control/stop worrying 3  Worry too much - different things 3  Trouble relaxing 3  Restless 1  Easily annoyed or irritable 3  Afraid - awful might happen 1  Total GAD 7 Score 16  Anxiety Difficulty Somewhat difficult     Assessment and Plan :  1. Dysuria History and point-of-care labs consistent with UTI.  Will treat empirically at this time.  Urine culture pending.  STD labs pending.  Advised to return to clinic if symptoms worsen, do not improve, or as needed. - POCT  urinalysis dipstick - GC/Chlamydia Probe Amp - Hepatitis panel, acute - HIV antibody - RPR - Trichomonas vaginalis, RNA  2. Sinus pressure - pseudoephedrine (SUDAFED 12 HOUR) 120 MG 12 hr tablet; Take 1 tablet (120 mg total) by mouth 2 (two) times daily.  Dispense: 20 tablet; Refill: 0 3. Nasal congestion - fluticasone (FLONASE) 50 MCG/ACT nasal spray; Place 2 sprays into both nostrils daily.  Dispense: 16 g; Refill: 0 4. Acute non-recurrent sinusitis, unspecified location History and physical exam findings consistent with sinusitis.  Likely viral etiology at this time.  Will treat symptomatically.  Advised to return if symptoms worsen or do not improve in 5-7 days.  5. Leukocytes in urine - Urine Culture - nitrofurantoin, macrocrystal-monohydrate, (MACROBID) 100 MG capsule; Take 1 capsule (100 mg total) by mouth 2 (two) times daily for 5 days.  Dispense: 10 capsule; Refill: 0  6. Anxiety state Pt would likely benefit from therapy as she has responded extremely well to therapy in the past. Given contact info for local therapist. Follow up as needed.   Benjiman CoreBrittany Arcadio Cope, PA-C  [facility] at [facility] 04/18/2017 6:04 PM

## 2017-04-18 NOTE — Patient Instructions (Addendum)
Your results indicate you have a UTI. I have given you a prescription for an antibiotic. Please take with food. I have sent off a urine culture and we should have those results in 48 hours. If your symptoms worsen while you are awaiting these results or you develop fever, chills, flank pian, nausea and vomiting, please seek care immediately.    -In terms of your sinus issues, we will treat for respiratory viral infection.  - I recommend you rest, drink plenty of fluids, eat light meals including soups.  - You may use sudafed, zyrtec, and flonase to help with congestion.  - Please let me know if you are not seeing any improvement or get worse in 5-7 days  In terms of anxiety and decreased mood, I recommend therapy: For therapy -- [facility] ([address] [place]) - [phone] [facility] [place]) - [phone] [facility] - [phone] [facility] - [phone] [facility] - [phone] [facility]  - [phone] (do not file insurance)   We should have your lab work back within one week and will contact you with these results.     Urinary Tract Infection, Adult A urinary tract infection (UTI) is an infection of any part of the urinary tract. The urinary tract includes the:  Kidneys.  Ureters.  Bladder.  Urethra.  These organs make, store, and get rid of pee (urine) in the body. Follow these instructions at home:  Take over-the-counter and prescription medicines only as told by your doctor.  If you were prescribed an antibiotic medicine, take it as told by your doctor. Do not stop taking the antibiotic even if you start to feel better.  Avoid the following drinks: ? Alcohol. ? Caffeine. ? Tea. ? Carbonated drinks.  Drink enough fluid to keep your pee clear or pale yellow.  Keep all follow-up visits as told by your doctor.  This is important.  Make sure to: ? Empty your bladder often and completely. Do not to hold pee for long periods of time. ? Empty your bladder before and after sex. ? Wipe from front to back after a bowel movement if you are female. Use each tissue one time when you wipe. Contact a doctor if:  You have back pain.  You have a fever.  You feel sick to your stomach (nauseous).  You throw up (vomit).  Your symptoms do not get better after 3 days.  Your symptoms go away and then come back. Get help right away if:  You have very bad back pain.  You have very bad lower belly (abdominal) pain.  You are throwing up and cannot keep down any medicines or water. This information is not intended to replace advice given to you by your health care provider. Make sure you discuss any questions you have with your health care provider. Document Released: 08/08/2007 Document Revised: 07/28/2015 Document Reviewed: 01/10/2015 Elsevier Interactive Patient Education  2018 [employer].   Sinusitis, Adult Sinusitis is soreness and inflammation of your sinuses. Sinuses are hollow spaces in the bones around your face. They are located:  Around your eyes.  In the middle of your forehead.  Behind your nose.  In your cheekbones.  Your sinuses and nasal passages are lined with a stringy fluid (mucus). Mucus normally drains out of your sinuses. When your nasal tissues get inflamed or swollen, the mucus can get trapped or blocked so air cannot flow through  your sinuses. This lets bacteria, viruses, and funguses grow, and that leads to infection. Follow these instructions at home: Medicines  Take, use, or apply over-the-counter and prescription medicines only as told by your doctor. These may include nasal sprays.  If you were prescribed an antibiotic medicine, take it as told by your doctor. Do not stop taking the antibiotic even if you start to feel better. Hydrate and Humidify  Drink enough  water to keep your pee (urine) clear or pale yellow.  Use a cool mist humidifier to keep the humidity level in your home above 50%.  Breathe in steam for 10-15 minutes, 3-4 times a day or as told by your doctor. You can do this in the bathroom while a hot shower is running.  Try not to spend time in cool or dry air. Rest  Rest as much as possible.  Sleep with your head raised (elevated).  Make sure to get enough sleep each night. General instructions  Put a warm, moist washcloth on your face 3-4 times a day or as told by your doctor. This will help with discomfort.  Wash your hands often with soap and water. If there is no soap and water, use hand sanitizer.  Do not smoke. Avoid being around people who are smoking (secondhand smoke).  Keep all follow-up visits as told by your doctor. This is important. Contact a doctor if:  You have a fever.  Your symptoms get worse.  Your symptoms do not get better within 10 days. Get help right away if:  You have a very bad headache.  You cannot stop throwing up (vomiting).  You have pain or swelling around your face or eyes.  You have trouble seeing.  You feel confused.  Your neck is stiff.  You have trouble breathing. This information is not intended to replace advice given to you by your health care provider. Make sure you discuss any questions you have with your health care provider. Document Released: 08/08/2007 Document Revised: 10/16/2015 Document Reviewed: 12/15/2014 Elsevier Interactive Patient Education  2018 [employer]    IF you received an x-ray today, you will receive an invoice from [facility]. Please contact [facility] at [phone] with questions or concerns regarding your invoice.   IF you received labwork today, you will receive an invoice from [facility]. Please contact [facility] at [phone] with questions or concerns regarding your invoice.   Our billing staff will not be able to  assist you with questions regarding bills from these companies.  You will be contacted with the lab results as soon as they are available. The fastest way to get your results is to activate your My Chart account. Instructions are located on the last page of this paperwork. If you have not heard from us regarding the results in 2 weeks, please contact this office.

## 2017-04-19 LAB — HEPATITIS PANEL, ACUTE
HEP B C IGM: NEGATIVE
HEP B S AG: NEGATIVE
Hep A IgM: NEGATIVE

## 2017-04-19 LAB — RPR: RPR Ser Ql: NONREACTIVE

## 2017-04-19 LAB — HIV ANTIBODY (ROUTINE TESTING W REFLEX): HIV Screen 4th Generation wRfx: NONREACTIVE

## 2017-04-20 LAB — URINE CULTURE

## 2017-04-20 LAB — GC/CHLAMYDIA PROBE AMP
Chlamydia trachomatis, NAA: NEGATIVE
Neisseria gonorrhoeae by PCR: NEGATIVE

## 2017-04-20 LAB — TRICHOMONAS VAGINALIS, PROBE AMP: Trich vag by NAA: NEGATIVE

## 2017-06-24 ENCOUNTER — Ambulatory Visit (INDEPENDENT_AMBULATORY_CARE_PROVIDER_SITE_OTHER): Payer: 59 | Admitting: Physician Assistant

## 2017-06-24 ENCOUNTER — Encounter: Payer: Self-pay | Admitting: Physician Assistant

## 2017-06-24 VITALS — BP 98/52 | HR 108 | Temp 97.8°F | Ht 67.0 in | Wt 138.6 lb

## 2017-06-24 DIAGNOSIS — R110 Nausea: Secondary | ICD-10-CM | POA: Diagnosis not present

## 2017-06-24 DIAGNOSIS — Z3A01 Less than 8 weeks gestation of pregnancy: Secondary | ICD-10-CM

## 2017-06-24 LAB — POCT URINALYSIS DIP (MANUAL ENTRY)
BILIRUBIN UA: NEGATIVE
BILIRUBIN UA: NEGATIVE mg/dL
GLUCOSE UA: NEGATIVE mg/dL
Leukocytes, UA: NEGATIVE
Nitrite, UA: NEGATIVE
Protein Ur, POC: NEGATIVE mg/dL
RBC UA: NEGATIVE
Spec Grav, UA: 1.02 (ref 1.010–1.025)
UROBILINOGEN UA: 1 U/dL
pH, UA: 6 (ref 5.0–8.0)

## 2017-06-24 LAB — POCT URINE PREGNANCY: Preg Test, Ur: POSITIVE — AB

## 2017-06-24 NOTE — Patient Instructions (Addendum)
Please increase your hydration to 64oz of water Please start a pre-natal medicine today Please review the recommendations below regarding your nausea.   Morning Sickness Morning sickness is when you feel sick to your stomach (nauseous) during pregnancy. You may feel sick to your stomach and throw up (vomit). You may feel sick in the morning, but you can feel this way any time of day. Some women feel very sick to their stomach and cannot stop throwing up (hyperemesis gravidarum). Follow these instructions at home:  Only take medicines as told by your doctor.  Take multivitamins as told by your doctor. Taking multivitamins before getting pregnant can stop or lessen the harshness of morning sickness.  Eat dry toast or unsalted crackers before getting out of bed.  Eat 5 to 6 small meals a day.  Eat dry and bland foods like rice and baked potatoes.  Do not drink liquids with meals. Drink between meals.  Do not eat greasy, fatty, or spicy foods.  Have someone cook for you if the smell of food causes you to feel sick or throw up.  If you feel sick to your stomach after taking prenatal vitamins, take them at night or with a snack.  Eat protein when you need a snack (nuts, yogurt, cheese).  Eat unsweetened gelatins for dessert.  Wear a bracelet used for sea sickness (acupressure wristband).  Go to a doctor that puts thin needles into certain body points (acupuncture) to improve how you feel.  Do not smoke.  Use a humidifier to keep the air in your house free of odors.  Get lots of fresh air. Contact a doctor if:  You need medicine to feel better.  You feel dizzy or lightheaded.  You are losing weight. Get help right away if:  You feel very sick to your stomach and cannot stop throwing up.  You pass out (faint). This information is not intended to replace advice given to you by your health care provider. Make sure you discuss any questions you have with your health care  provider. Document Released: 03/29/2004 Document Revised: 07/28/2015 Document Reviewed: 08/06/2012 Elsevier Interactive Patient Education  2017 [employer].   First Trimester of Pregnancy The first trimester of pregnancy is from week 1 until the end of week 13 (months 1 through 3). During this time, your baby will begin to develop inside you. At 6-8 weeks, the eyes and face are formed, and the heartbeat can be seen on ultrasound. At the end of 12 weeks, all the baby's organs are formed. Prenatal care is all the medical care you receive before the birth of your baby. Make sure you get good prenatal care and follow all of your doctor's instructions. Follow these instructions at home: Medicines  Take over-the-counter and prescription medicines only as told by your doctor. Some medicines are safe and some medicines are not safe during pregnancy.  Take a prenatal vitamin that contains at least 600 micrograms (mcg) of folic acid.  If you have trouble pooping (constipation), take medicine that will make your stool soft (stool softener) if your doctor approves. Eating and drinking  Eat regular, healthy meals.  Your doctor will tell you the amount of weight gain that is right for you.  Avoid raw meat and uncooked cheese.  If you feel sick to your stomach (nauseous) or throw up (vomit): ? Eat 4 or 5 small meals a day instead of 3 large meals. ? Try eating a few soda crackers. ? Drink liquids between meals  instead of during meals.  To prevent constipation: ? Eat foods that are high in fiber, like fresh fruits and vegetables, whole grains, and beans. ? Drink enough fluids to keep your pee (urine) clear or pale yellow. Activity  Exercise only as told by your doctor. Stop exercising if you have cramps or pain in your lower belly (abdomen) or low back.  Do not exercise if it is too hot, too humid, or if you are in a place of great height (high altitude).  Try to avoid standing for long  periods of time. Move your legs often if you must stand in one place for a long time.  Avoid heavy lifting.  Wear low-heeled shoes. Sit and stand up straight.  You can have sex unless your doctor tells you not to. Relieving pain and discomfort  Wear a good support bra if your breasts are sore.  Take warm water baths (sitz baths) to soothe pain or discomfort caused by hemorrhoids. Use hemorrhoid cream if your doctor says it is okay.  Rest with your legs raised if you have leg cramps or low back pain.  If you have puffy, bulging veins (varicose veins) in your legs: ? Wear support hose or compression stockings as told by your doctor. ? Raise (elevate) your feet for 15 minutes, 3-4 times a day. ? Limit salt in your food. Prenatal care  Schedule your prenatal visits by the twelfth week of pregnancy.  Write down your questions. Take them to your prenatal visits.  Keep all your prenatal visits as told by your doctor. This is important. Safety  Wear your seat belt at all times when driving.  Make a list of emergency phone numbers. The list should include numbers for family, friends, the hospital, and police and fire departments. General instructions  Ask your doctor for a referral to a local prenatal class. Begin classes no later than at the start of month 6 of your pregnancy.  Ask for help if you need counseling or if you need help with nutrition. Your doctor can give you advice or tell you where to go for help.  Do not use hot tubs, steam rooms, or saunas.  Do not douche or use tampons or scented sanitary pads.  Do not cross your legs for long periods of time.  Avoid all herbs and alcohol. Avoid drugs that are not approved by your doctor.  Do not use any tobacco products, including cigarettes, chewing tobacco, and electronic cigarettes. If you need help quitting, ask your doctor. You may get counseling or other support to help you quit.  Avoid cat litter boxes and soil used  by cats. These carry germs that can cause birth defects in the baby and can cause a loss of your baby (miscarriage) or stillbirth.  Visit your dentist. At home, brush your teeth with a soft toothbrush. Be gentle when you floss. Contact a doctor if:  You are dizzy.  You have mild cramps or pressure in your lower belly.  You have a nagging pain in your belly area.  You continue to feel sick to your stomach, you throw up, or you have watery poop (diarrhea).  You have a bad smelling fluid coming from your vagina.  You have pain when you pee (urinate).  You have increased puffiness (swelling) in your face, hands, legs, or ankles. Get help right away if:  You have a fever.  You are leaking fluid from your vagina.  You have spotting or bleeding from your vagina.  You have very bad belly cramping or pain.  You gain or lose weight rapidly.  You throw up blood. It may look like coffee grounds.  You are around people who have [place] measles, fifth disease, or chickenpox.  You have a very bad headache.  You have shortness of breath.  You have any kind of trauma, such as from a fall or a car accident. Summary  The first trimester of pregnancy is from week 1 until the end of week 13 (months 1 through 3).  To take care of yourself and your unborn baby, you will need to eat healthy meals, take medicines only if your doctor tells you to do so, and do activities that are safe for you and your baby.  Keep all follow-up visits as told by your doctor. This is important as your doctor will have to ensure that your baby is healthy and growing well. This information is not intended to replace advice given to you by your health care provider. Make sure you discuss any questions you have with your health care provider. Document Released: 08/08/2007 Document Revised: 02/28/2016 Document Reviewed: 02/28/2016 Elsevier Interactive Patient Education  2017 [employer]   IF you received an x-ray  today, you will receive an invoice from [facility]. Please contact [facility] at [phone] with questions or concerns regarding your invoice.   IF you received labwork today, you will receive an invoice from [facility]. Please contact [facility] at [phone] with questions or concerns regarding your invoice.   Our billing staff will not be able to assist you with questions regarding bills from these companies.  You will be contacted with the lab results as soon as they are available. The fastest way to get your results is to activate your My Chart account. Instructions are located on the last page of this paperwork. If you have not heard from us regarding the results in 2 weeks, please contact this office.

## 2017-06-24 NOTE — Progress Notes (Deleted)
PRIMARY CARE AT [facility] [address] [place] [address] [phone]  Date:  06/24/2017   Name:  Tasha Wu   DOB:  1990/08/06   MRN:  [id number]  PCP:  Magdalene River, PA-C    History of Present Illness:  Tasha Wu is a 27 y.o. female patient who presents to PCP with  Chief Complaint  Patient presents with  . pregnacy confirmation    work note? (took test yesterday)       Patient Active Problem List   Diagnosis Date Noted  . Active labor at term 12/28/2013  . Post term pregnancy, antepartum condition or complication 06/18/2012  . Abnormal Pap smear 12/10/2011  . Umbilical hernia 11/29/2011  . H/O pyelonephritis 11/29/2011    Past Medical History:  Diagnosis Date  . Abnormal Pap smear 12/10/2011  . Anemia   . Anxiety 2010   anxiety attack;no meds;has not been dx'd  . Emotional instability (HCC)    Break down easily when things are not going her way  . GERD (gastroesophageal reflux disease)   . Hx of joint problems    Shoulders and knees have pain  . Infection    Yeast;would get frequently  . Infection    BV x 1  . Kidney infection 2010   PO antibxs  . Kidney stone complicating pregnancy   . Migraines    no rx'd meds in the past;goes to sleep  . PCOS (polycystic ovarian syndrome)   . Sickle cell trait (HCC)   . Umbilical hernia 1991   Does not cause anyp problems  . Vaginal Pap smear, abnormal     Past Surgical History:  Procedure Laterality Date  . DILATION AND CURETTAGE OF UTERUS  2009   No complications  . WISDOM TOOTH EXTRACTION      Social History   Tobacco Use  . Smoking status: Former Smoker    Packs/day: 0.25    Types: Cigarettes    Last attempt to quit: 11/18/2011    Years since quitting: 5.6  . Smokeless tobacco: Never Used  Substance Use Topics  . Alcohol use: Yes    Alcohol/week: 0.0 oz    Comment: occ  . Drug use: No    Family History  Problem Relation Age of Onset  . Sickle cell trait Mother   .  Hypertension Mother   . Anemia Mother   . Sickle cell trait Brother   . Sickle cell anemia Maternal Aunt        Recently deceased from [facility] crisis  . Sickle cell anemia Maternal Grandmother        Deceased  . Heart disease Maternal Aunt   . Asthma Sister        1/2 sibling;father's child  . Hyperthyroidism Paternal Grandmother   . Scoliosis Paternal Grandmother   . Arthritis Paternal Grandmother   . Migraines Father   . Other Father        Joint Problems;shoulders and knees  . Hernia Maternal Aunt        Umbilical  . Hypertension Maternal Uncle   . Diabetes Maternal Aunt   . Diabetes type I Cousin        maternal  . Diabetes Paternal Grandfather     No Known Allergies  Medication list has been reviewed and updated.  Current Outpatient Medications on File Prior to Visit  Medication Sig Dispense Refill  . fluticasone (FLONASE) 50 MCG/ACT nasal spray Place 2 sprays into both nostrils daily. 16 g 0  No current facility-administered medications on file prior to visit.     ROS ROS otherwise unremarkable unless listed above.  Physical Examination: BP (!) 98/52 (BP Location: Left Arm, Patient Position: Sitting, Cuff Size: Normal)   Pulse (!) 108   Temp 97.8 F (36.6 C) (Oral)   Ht 5\' 7"  (1.702 m)   Wt 138 lb 9.6 oz (62.9 kg)   LMP 05/20/2017   SpO2 99%   BMI 21.71 kg/m  Ideal Body Weight: Weight in (lb) to have BMI = 25: 159.3  Physical Exam  Orthostatic VS for the past 24 hrs (Last 3 readings):  BP- Lying Pulse- Lying BP- Sitting Pulse- Sitting BP- Standing at 0 minutes Pulse- Standing at 0 minutes  06/24/17 1343 99/61 78 96/64 78 100/67 86    Assessment and Plan: Tasha Wu Start is a 27 y.o. female who is here today  1. Nausea without vomiting *** - POCT urine pregnancy   Trena PlattStephanie Pahoua Schreiner, PA-C [facility] 06/24/2017 1:58 PM

## 2017-06-24 NOTE — Progress Notes (Signed)
[facility] [address], [place] [id number] [phone]  Date:  06/24/2017   Name:  Tasha Wu   DOB:  February 06, 1991   MRN:  [id number]  PCP:  Magdalene RiverWiseman, Brittany D, PA-C   History of Present Illness:  Tasha Wu is a 27 y.o. female patient who presents to PCP with  Chief Complaint  Patient presents with  . pregnacy confirmation    work note? (took test yesterday)      2 children.  Normal pregnancies.  Full term.  405, 27 years old.  Vaginal births.   No dysuria or hematuria.   No blood or black stool.  No bowel movement in the last several days.   She is having gas.   No abnormal vaginal discharge. She is having some dizziness.   16 oz of water. No abnormal abdominal pain.   Patient's last menstrual period was 05/20/2017.  Patient Active Problem List   Diagnosis Date Noted  . Active labor at term 12/28/2013  . Post term pregnancy, antepartum condition or complication 06/18/2012  . Abnormal Pap smear 12/10/2011  . Umbilical hernia 11/29/2011  . H/O pyelonephritis 11/29/2011    Past Medical History:  Diagnosis Date  . Abnormal Pap smear 12/10/2011  . Anemia   . Anxiety 2010   anxiety attack;no meds;has not been dx'd  . Emotional instability (HCC)    Break down easily when things are not going her way  . GERD (gastroesophageal reflux disease)   . Hx of joint problems    Shoulders and knees have pain  . Infection    Yeast;would get frequently  . Infection    BV x 1  . Kidney infection 2010   PO antibxs  . Kidney stone complicating pregnancy   . Migraines    no rx'd meds in the past;goes to sleep  . PCOS (polycystic ovarian syndrome)   . Sickle cell trait (HCC)   . Umbilical hernia 1991   Does not cause anyp problems  . Vaginal Pap smear, abnormal     Past Surgical History:  Procedure Laterality Date  . DILATION AND CURETTAGE OF UTERUS  2009   No complications  . WISDOM TOOTH EXTRACTION      Social History   Tobacco Use  .  Smoking status: Former Smoker    Packs/day: 0.25    Types: Cigarettes    Last attempt to quit: 11/18/2011    Years since quitting: 5.6  . Smokeless tobacco: Never Used  Substance Use Topics  . Alcohol use: Yes    Alcohol/week: 0.0 oz    Comment: occ  . Drug use: No    Family History  Problem Relation Age of Onset  . Sickle cell trait Mother   . Hypertension Mother   . Anemia Mother   . Sickle cell trait Brother   . Sickle cell anemia Maternal Aunt        [facility] deceased from [facility] crisis  . Sickle cell anemia Maternal Grandmother        Deceased  . Heart disease Maternal Aunt   . Asthma Sister        1/2 sibling;father's child  . Hyperthyroidism Paternal Grandmother   . Scoliosis Paternal Grandmother   . Arthritis Paternal Grandmother   . Migraines Father   . Other Father        Joint Problems;shoulders and knees  . Hernia Maternal Aunt        Umbilical  . Hypertension Maternal Uncle   .  Diabetes Maternal Aunt   . Diabetes type I Cousin        maternal  . Diabetes Paternal Grandfather     No Known Allergies  Medication list has been reviewed and updated.  Current Outpatient Medications on File Prior to Visit  Medication Sig Dispense Refill  . fluticasone (FLONASE) 50 MCG/ACT nasal spray Place 2 sprays into both nostrils daily. 16 g 0   No current facility-administered medications on file prior to visit.     ROS ROS otherwise unremarkable unless listed above.  Physical Examination: BP (!) 98/52 (BP Location: Left Arm, Patient Position: Sitting, Cuff Size: Normal)   Pulse (!) 108   Temp 97.8 F (36.6 C) (Oral)   Ht 5\' 7"  (1.702 m)   Wt 138 lb 9.6 oz (62.9 kg)   LMP 05/20/2017   SpO2 99%   BMI 21.71 kg/m  Ideal Body Weight: Weight in (lb) to have BMI = 25: 159.3  Physical Exam  Constitutional: She is oriented to person, place, and time. She appears well-developed and well-nourished. No distress.  HENT:  Head: Normocephalic and atraumatic.  Right Ear:  External ear normal.  Left Ear: External ear normal.  Eyes: Pupils are equal, round, and reactive to light. Conjunctivae and EOM are normal.  Cardiovascular: Normal rate and regular rhythm. Exam reveals no friction rub.  No murmur heard. Pulmonary/Chest: Effort normal. No respiratory distress. She has no wheezes.  Abdominal: Soft. Normal appearance and bowel sounds are normal. There is no tenderness.  Neurological: She is alert and oriented to person, place, and time.  Skin: She is not diaphoretic.  Psychiatric: She has a normal mood and affect. Her behavior is normal.     Orthostatic VS for the past 24 hrs (Last 3 readings):  BP- Lying Pulse- Lying BP- Sitting Pulse- Sitting BP- Standing at 0 minutes Pulse- Standing at 0 minutes  06/24/17 1343 99/61 78 96/64 78 100/67 86   Results for orders placed or performed in visit on 06/24/17  POCT urine pregnancy  Result Value Ref Range   Preg Test, Ur Positive (A) Negative  POCT urinalysis dipstick  Result Value Ref Range   Color, UA yellow yellow   Clarity, UA clear clear   Glucose, UA negative negative mg/dL   Bilirubin, UA negative negative   Ketones, POC UA negative negative mg/dL   Spec Grav, UA 1.610 9.604 - 1.025   Blood, UA negative negative   pH, UA 6.0 5.0 - 8.0   Protein Ur, POC negative negative mg/dL   Urobilinogen, UA 1.0 0.2 or 1.0 E.U./dL   Nitrite, UA Negative Negative   Leukocytes, UA Negative Negative     Assessment and Plan: Tasha Wu is a 27 y.o. female who is here today for cc of  Chief Complaint  Patient presents with  . pregnacy confirmation    work note? (took test yesterday)   Advised heavy hydration and start of prenatals. Given note for work today and tomorrow Letter given for follow up with obgyn. Discussed anti-nausea diet and options. Less than [redacted] weeks gestation of pregnancy  Nausea without vomiting - Plan: POCT urine pregnancy, Orthostatic vital signs, POCT urinalysis  dipstick  Trena Platt, PA-C [facility] 4/24/201912:37 PM

## 2017-06-26 ENCOUNTER — Telehealth: Payer: Self-pay | Admitting: Physician Assistant

## 2017-06-26 NOTE — Telephone Encounter (Signed)
.  mychart

## 2017-07-21 DIAGNOSIS — D573 Sickle-cell trait: Secondary | ICD-10-CM | POA: Insufficient documentation

## 2017-07-22 DIAGNOSIS — N12 Tubulo-interstitial nephritis, not specified as acute or chronic: Secondary | ICD-10-CM | POA: Insufficient documentation

## 2017-07-22 LAB — OB RESULTS CONSOLE RUBELLA ANTIBODY, IGM: RUBELLA: IMMUNE

## 2017-07-22 LAB — OB RESULTS CONSOLE ANTIBODY SCREEN: ANTIBODY SCREEN: NEGATIVE

## 2017-07-22 LAB — OB RESULTS CONSOLE ABO/RH: RH TYPE: POSITIVE

## 2017-07-22 LAB — OB RESULTS CONSOLE HEPATITIS B SURFACE ANTIGEN: Hepatitis B Surface Ag: NEGATIVE

## 2017-07-22 LAB — OB RESULTS CONSOLE RPR: RPR: NONREACTIVE

## 2017-07-22 LAB — OB RESULTS CONSOLE GC/CHLAMYDIA
Chlamydia: NEGATIVE
Gonorrhea: NEGATIVE

## 2017-07-22 LAB — OB RESULTS CONSOLE HIV ANTIBODY (ROUTINE TESTING): HIV: NONREACTIVE

## 2017-07-24 DIAGNOSIS — Z3490 Encounter for supervision of normal pregnancy, unspecified, unspecified trimester: Secondary | ICD-10-CM | POA: Insufficient documentation

## 2017-08-19 DIAGNOSIS — R87610 Atypical squamous cells of undetermined significance on cytologic smear of cervix (ASC-US): Secondary | ICD-10-CM | POA: Insufficient documentation

## 2017-09-18 ENCOUNTER — Encounter: Payer: Self-pay | Admitting: Physician Assistant

## 2017-09-18 ENCOUNTER — Ambulatory Visit (INDEPENDENT_AMBULATORY_CARE_PROVIDER_SITE_OTHER): Payer: 59 | Admitting: Physician Assistant

## 2017-09-18 ENCOUNTER — Other Ambulatory Visit: Payer: Self-pay

## 2017-09-18 VITALS — BP 94/66 | HR 73 | Temp 98.6°F | Resp 20 | Ht 68.11 in | Wt 129.8 lb

## 2017-09-18 DIAGNOSIS — J029 Acute pharyngitis, unspecified: Secondary | ICD-10-CM

## 2017-09-18 DIAGNOSIS — J069 Acute upper respiratory infection, unspecified: Secondary | ICD-10-CM | POA: Diagnosis not present

## 2017-09-18 LAB — POCT RAPID STREP A (OFFICE): RAPID STREP A SCREEN: NEGATIVE

## 2017-09-18 NOTE — Patient Instructions (Addendum)
Your strep test is negative. We will send a culture to be sure. We will have this back within a few days.I recommend over the counter nasal saline rinses nightly and using a warm mist humidifier by your bed at night itme.     You may also use Tylenol your sore throat. Tea recipe for sore throat: boil water, add 2 inches shaved ginger root, steep 15 minutes, add juice from 2 full lemons, and 2 tbsp honey.   For allergies, you can safely use over the counter zyrtec. This is safer than nasal flonase.    How to Treat a Cold or Cough During Pregnancy If you get a cold or a cough, try treating it by doing the following: Get ample rest - Take naps, sleep through the night, and sit down to relax. These are great ways to give your body much needed down time. Learn more about the importance of bed rest during pregnancy. Drink plenty of fluids - Drink water, juice, or broth to add necessary fluids back into your body. Eat well - Even if you cannot stomach larger meals, try eating small portions often. For your own comfort, it is important that you treat the symptoms associated with your cold or a cough. Natural remedies to some of your most bothersome symptoms include: Reduce congestion - Place a humidifier in your room, keep your head elevated on your pillow while resting, or use nasal strips. Alleviate your sore throat - Suck on ice chips, drink warm tea, or gargle with warm salt water. It is best to reduce the number of over-the-counter medications you take. Many medications you normally would use to treat the symptoms of your cold are not safe to take during your pregnancy. The following is a list of medications that pose little risk to your baby during pregnancy; however, it is best to consult with your doctor before taking any medications to relieve your symptoms. Acetaminophen (i.e. Tylenol) can be used to alleviate fevers, headaches, and body aches. Anesthetic sore throat lozenges can ease the pain  in your throat.   IF you received an x-ray today, you will receive an invoice from [facility]. Please contact [facility] at [phone] with questions or concerns regarding your invoice.   IF you received labwork today, you will receive an invoice from [facility]. Please contact [facility] at [phone] with questions or concerns regarding your invoice.   Our billing staff will not be able to assist you with questions regarding bills from these companies.  You will be contacted with the lab results as soon as they are available. The fastest way to get your results is to activate your My Chart account. Instructions are located on the last page of this paperwork. If you have not heard from us regarding the results in 2 weeks, please contact this office.

## 2017-09-18 NOTE — Progress Notes (Signed)
str    MRN: [id number] DOB: 25-Sep-1990  Subjective:   Camaya Lisbon is a 27 y.o. female presenting for chief complaint of Sinus Problem (X 1 day) and Nasal Congestion (X 1 day) .  Reports 1 day history of nasal congestion, scratchy sore throat, sinus congestion, and ear fullness.  She is [redacted] weeks pregnant. Has tried tylenol and flonase with some relief. Notes she has been feeling more congested at night time.  Denies fever, ear pain, wheezing, shortness of breath, chest pain, myalgia and cough, nausea, vomiting, abdominal pain, diarrhea and dizziness. Has had sick contact with kids. No history of seasonal allergies, no history of asthma. Denies smoking and alcohol use. Denies any other aggravating or relieving factors, no other questions or concerns.  Review of Systems  Gastrointestinal: Negative for abdominal pain, nausea and vomiting.  Neurological: Negative for dizziness.    Mariyanna has a current medication list which includes the following prescription(s): fluticasone. Also has No Known Allergies.  Verl  has a past medical history of Abnormal Pap smear (12/10/2011), Anemia, Anxiety (2010), Emotional instability (HCC), GERD (gastroesophageal reflux disease), joint problems, Infection, Infection, Kidney infection (2010), Kidney stone complicating pregnancy, Migraines, PCOS (polycystic ovarian syndrome), Sickle cell trait (HCC), Umbilical hernia (1991), and Vaginal Pap smear, abnormal. Also  has a past surgical history that includes Dilation and curettage of uterus (2009) and Wisdom tooth extraction.   Objective:   Vitals: BP 94/66 (BP Location: Right Arm, Patient Position: Sitting, Cuff Size: Normal)   Pulse 73   Temp 98.6 F (37 C) (Oral)   Resp 20   Ht 5' 8.11" (1.73 m)   Wt 129 lb 12.8 oz (58.9 kg)   LMP  (Approximate)   SpO2 97%   BMI 19.67 kg/m   Physical Exam  Constitutional: She is oriented to person, place, and time. She appears well-developed and well-nourished.  No distress.  HENT:  Head: Normocephalic and atraumatic.  Right Ear: External ear and ear canal normal. A middle ear effusion is present.  Left Ear: External ear and ear canal normal. A middle ear effusion is present.  Nose: Mucosal edema (moderate b/l) present. Right sinus exhibits no maxillary sinus tenderness and no frontal sinus tenderness. Left sinus exhibits no maxillary sinus tenderness and no frontal sinus tenderness.  Mouth/Throat: Uvula is midline and mucous membranes are normal. Posterior oropharyngeal erythema (cobblestoning noted) present. No posterior oropharyngeal edema or tonsillar abscesses. No tonsillar exudate.  Eyes: Conjunctivae are normal.  Neck: Normal range of motion.  Cardiovascular: Normal rate, regular rhythm, normal heart sounds and intact distal pulses.  Pulmonary/Chest: Effort normal and breath sounds normal. She has no decreased breath sounds. She has no wheezes. She has no rhonchi. She has no rales.  Lymphadenopathy:       Head (right side): No submental, no submandibular, no tonsillar, no preauricular, no posterior auricular and no occipital adenopathy present.       Head (left side): No submental, no submandibular, no tonsillar, no preauricular, no posterior auricular and no occipital adenopathy present.    She has no cervical adenopathy.       Right: No supraclavicular adenopathy present.       Left: No supraclavicular adenopathy present.  Neurological: She is alert and oriented to person, place, and time.  Skin: Skin is warm and dry.  Psychiatric: She has a normal mood and affect.  Vitals reviewed.   Results for orders placed or performed in visit on 09/18/17 (from the past 24 hour(s))  POCT rapid  strep A     Status: None   Collection Time: 09/18/17 12:13 PM  Result Value Ref Range   Rapid Strep A Screen Negative Negative     BP Readings from Last 3 Encounters:  09/18/17 94/66  06/24/17 (!) 98/52  04/18/17 100/60    Assessment and Plan :  1.  Acute upper respiratory infection - Likely viral in etiology d/t reassuring physical exam findings and labs. Suspect there could be an allergy component to her sx due to her hx. Rec nasal saline rinses, warm mist humidifier, and OTC zyrtec as needed. Can continue with tylenol. Strep cx pending.  Encouraged to drink plenty of water as her bp is always near the lower end.  - Return to clinic if symptoms worsen or fail to improve in 7-10 days, otherwise return to clinic as needed.  2. Sore throat - POCT rapid strep A - Culture, Group A Strep   Benjiman CoreBrittany Jerris Keltz, PA-C  [facility] 09/18/2017 12:54 PM

## 2017-09-20 LAB — CULTURE, GROUP A STREP: Strep A Culture: NEGATIVE

## 2017-12-08 ENCOUNTER — Inpatient Hospital Stay (HOSPITAL_COMMUNITY)
Admission: AD | Admit: 2017-12-08 | Discharge: 2017-12-08 | Disposition: A | Discharge status: Home / Self Care | Payer: 59 | Source: Ambulatory Visit | Attending: Obstetrics & Gynecology | Admitting: Obstetrics & Gynecology

## 2017-12-08 ENCOUNTER — Inpatient Hospital Stay (HOSPITAL_COMMUNITY): Payer: 59

## 2017-12-08 ENCOUNTER — Encounter (HOSPITAL_COMMUNITY): Payer: Self-pay

## 2017-12-08 DIAGNOSIS — I4510 Unspecified right bundle-branch block: Secondary | ICD-10-CM

## 2017-12-08 DIAGNOSIS — R0602 Shortness of breath: Secondary | ICD-10-CM | POA: Insufficient documentation

## 2017-12-08 DIAGNOSIS — Z87891 Personal history of nicotine dependence: Secondary | ICD-10-CM | POA: Diagnosis not present

## 2017-12-08 DIAGNOSIS — O9989 Other specified diseases and conditions complicating pregnancy, childbirth and the puerperium: Secondary | ICD-10-CM

## 2017-12-08 DIAGNOSIS — R55 Syncope and collapse: Secondary | ICD-10-CM

## 2017-12-08 DIAGNOSIS — O99891 Other specified diseases and conditions complicating pregnancy: Secondary | ICD-10-CM

## 2017-12-08 DIAGNOSIS — Z3A28 28 weeks gestation of pregnancy: Secondary | ICD-10-CM | POA: Insufficient documentation

## 2017-12-08 LAB — CBC
HCT: 29.2 % — ABNORMAL LOW (ref 36.0–46.0)
HEMOGLOBIN: 9.9 g/dL — AB (ref 12.0–15.0)
MCH: 26.8 pg (ref 26.0–34.0)
MCHC: 33.9 g/dL (ref 30.0–36.0)
MCV: 78.9 fL (ref 78.0–100.0)
Platelets: 214 10*3/uL (ref 150–400)
RBC: 3.7 MIL/uL — AB (ref 3.87–5.11)
RDW: 12.6 % (ref 11.5–15.5)
WBC: 7.1 10*3/uL (ref 4.0–10.5)

## 2017-12-08 LAB — URINALYSIS, ROUTINE W REFLEX MICROSCOPIC
Bilirubin Urine: NEGATIVE
Glucose, UA: NEGATIVE mg/dL
HGB URINE DIPSTICK: NEGATIVE
Ketones, ur: NEGATIVE mg/dL
NITRITE: NEGATIVE
Protein, ur: NEGATIVE mg/dL
SPECIFIC GRAVITY, URINE: 1.017 (ref 1.005–1.030)
pH: 7 (ref 5.0–8.0)

## 2017-12-08 LAB — COMPREHENSIVE METABOLIC PANEL
ALT: 8 U/L (ref 0–44)
ANION GAP: 7 (ref 5–15)
AST: 14 U/L — ABNORMAL LOW (ref 15–41)
Albumin: 3.1 g/dL — ABNORMAL LOW (ref 3.5–5.0)
Alkaline Phosphatase: 55 U/L (ref 38–126)
BILIRUBIN TOTAL: 0.2 mg/dL — AB (ref 0.3–1.2)
BUN: 11 mg/dL (ref 6–20)
CO2: 21 mmol/L — AB (ref 22–32)
Calcium: 8.3 mg/dL — ABNORMAL LOW (ref 8.9–10.3)
Chloride: 107 mmol/L (ref 98–111)
Creatinine, Ser: 0.71 mg/dL (ref 0.44–1.00)
GFR calc Af Amer: >60 mL/min (ref >60)
GLUCOSE: 77 mg/dL (ref 70–99)
POTASSIUM: 3.5 mmol/L (ref 3.5–5.1)
Sodium: 135 mmol/L (ref 135–145)
Total Protein: 6.7 g/dL (ref 6.5–8.1)

## 2017-12-08 LAB — TROPONIN I: Troponin I: <0.03 ng/mL (ref <0.03)

## 2017-12-08 IMAGING — CT CT ANGIO CHEST
2 series · 14 of 32 positions shown · IV contrast (OMNIPAQUE)
Comparison: None.

CLINICAL DATA: Shortness of breath. Patient shielded due to
pregnancy.

EXAM:
CT ANGIOGRAPHY CHEST WITH CONTRAST
TECHNIQUE: Multidetector CT imaging of the chest was performed using the
standard protocol during bolus administration of intravenous
contrast. Multiplanar CT image reconstructions and MIPs were
obtained to evaluate the vascular anatomy.
CONTRAST:  100mL [38] IOPAMIDOL ([38]) INJECTION 76%

[Series 4: pe chest · axial · 0.59mm/px · z∈[+853,+1053]mm · 6 of 151 slices shown]
[im 26/151  lung]
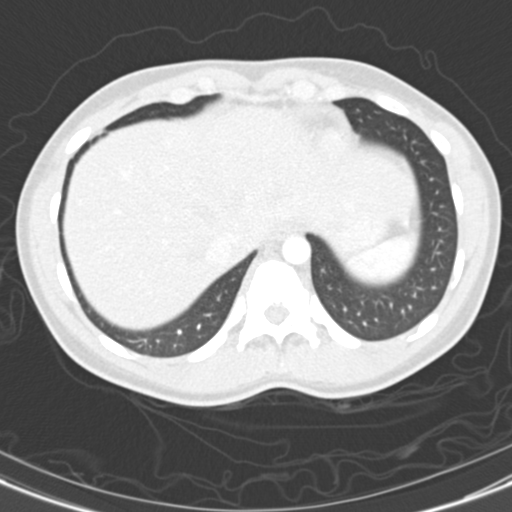
[im 51/151  mediastinal]
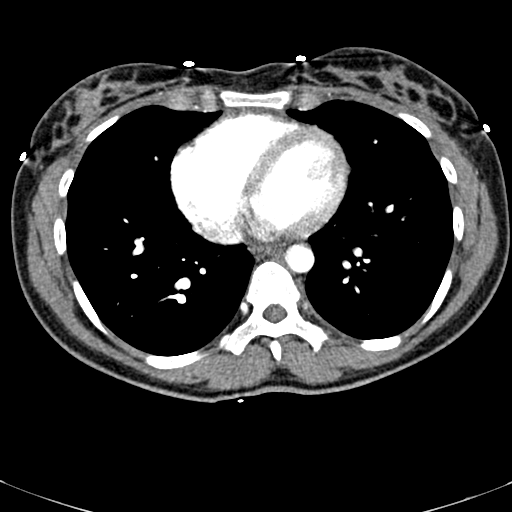
[im 72/151  lung]
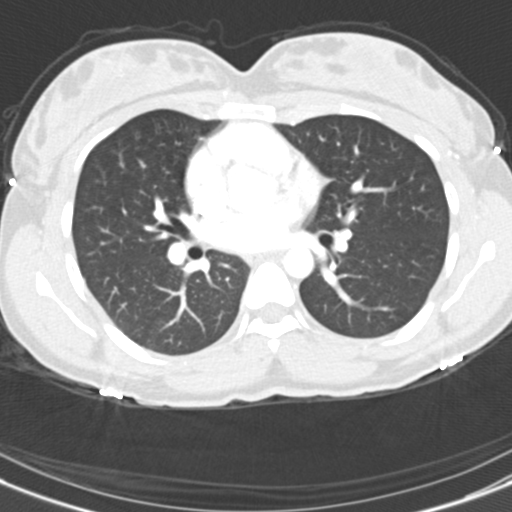
[im 76/151  mediastinal]
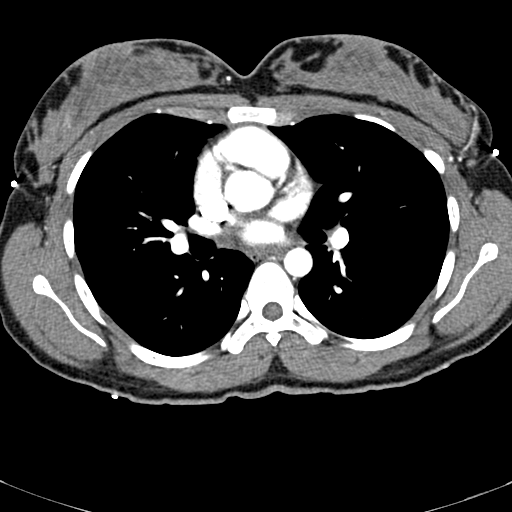
[im 101/151  lung]
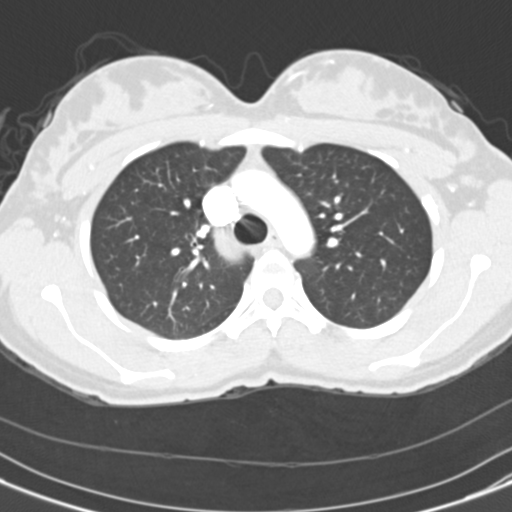
[im 126/151  mediastinal]
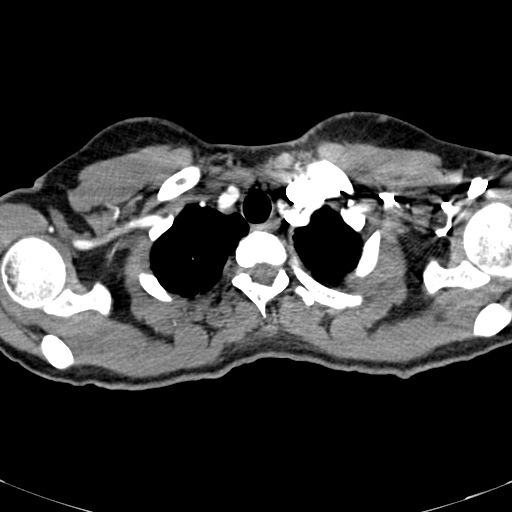

[Series 5: (person_name) thins · axial · 0.59mm/px · z∈[+833,+1073]mm · 8 of 429 slices shown]
[im 43/429  lung]
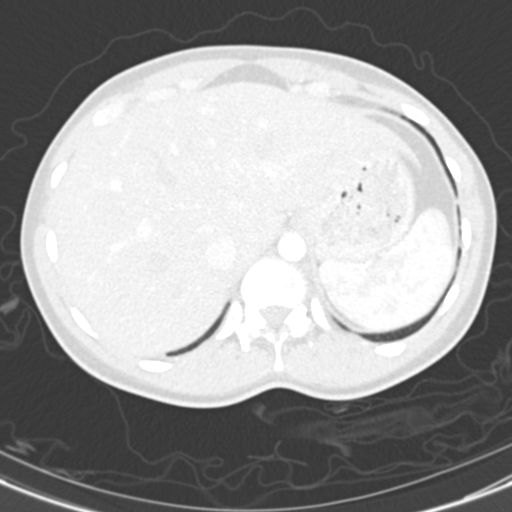
[im 108/429  lung]
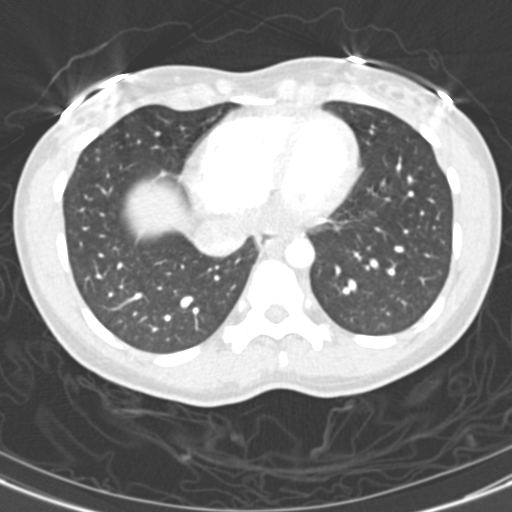
[im 143/429  lung]
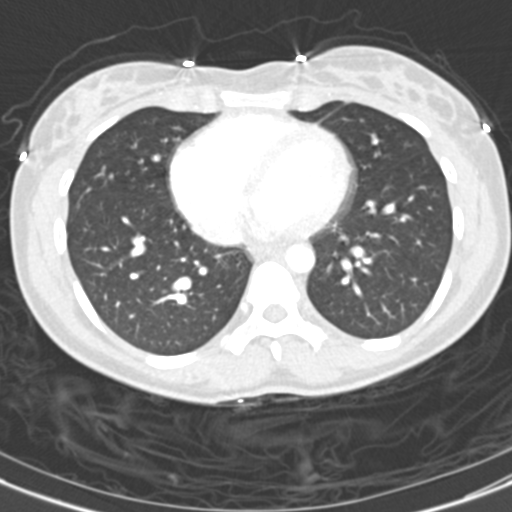
[im 193/429  lung]
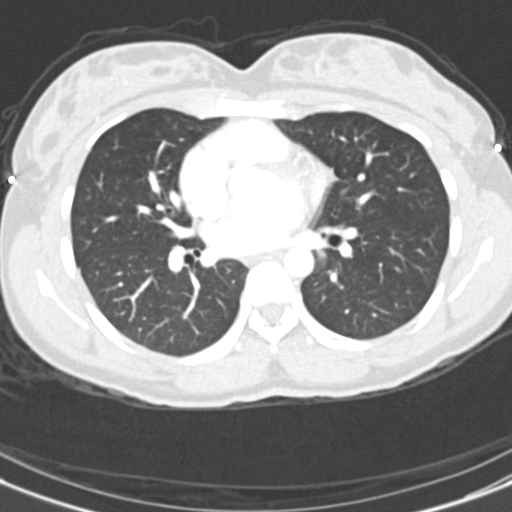
[im 236/429  lung]
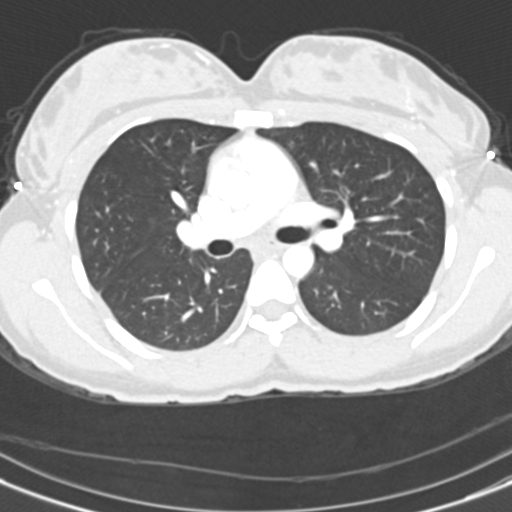
[im 286/429  lung]
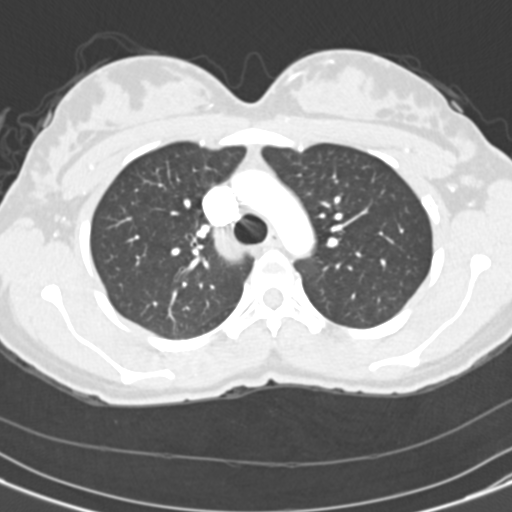
[im 322/429  lung]
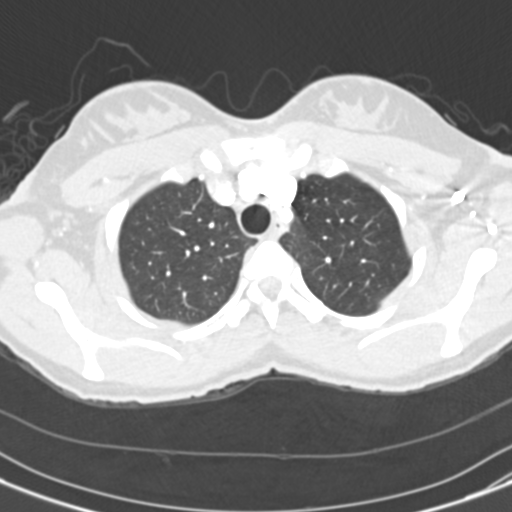
[im 386/429  lung]
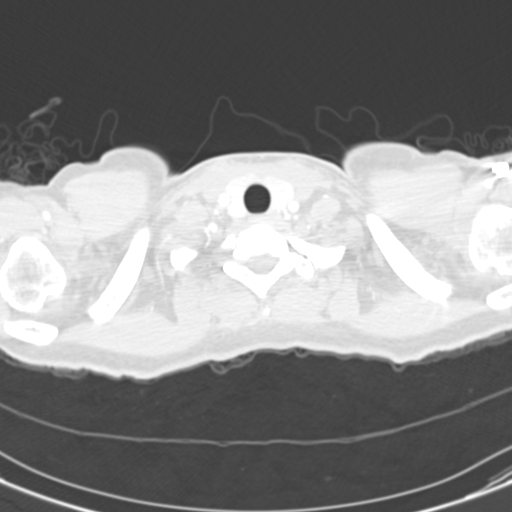

[14 of 32 positions shown; findings below may reference images not displayed]

FINDINGS: Cardiovascular: No filling defects in the pulmonary arteries to
suggest pulmonary emboli. Heart is normal size. Aorta is normal
caliber.

Mediastinum/Nodes: No mediastinal, hilar, or axillary adenopathy.

Lungs/Pleura: Calcified granuloma in the left lower lobe. No
confluent opacities or effusions.

Upper Abdomen: Imaging into the upper abdomen shows no acute
findings.

Musculoskeletal: Chest wall soft tissues are unremarkable. No acute
bony abnormality.

Review of the MIP images confirms the above findings.
IMPRESSION: No evidence of pulmonary embolus.

Small calcified granuloma in the left lower lobe.

No active cardiopulmonary disease.

## 2017-12-08 MED ORDER — IOPAMIDOL (ISOVUE-370) INJECTION 76%
100.0000 mL | Freq: Once | INTRAVENOUS | Status: AC | PRN
  Administered 2017-12-08: 100 mL via INTRAVENOUS

## 2017-12-08 NOTE — Discharge Instructions (Signed)
Near-Syncope °Near-syncope is when you suddenly become weak or dizzy, or you feel like you might pass out (faint). During an episode of near-syncope, you may: °· Feel dizzy or light-headed. °· Feel nauseous. °· See all white or all black in your field of vision. °· Have cold, clammy skin. ° °This condition is caused by a sudden decrease in blood flow to the brain. This decrease can result from various causes, but most of those causes are not dangerous. However, near-syncope can be a sign of a serious medical problem, so it is important to seek medical care. °If you fainted, get medical help right away.Call your local emergency services (911 in the [place] Do not drive yourself to the hospital. °Follow these instructions at home: °Pay attention to any changes in your symptoms. Take these actions to help with your condition: °· Have someone stay with you until you feel stable. °· Do not drive, use machinery, or play sports until your health care provider says it is okay. °· Keep all follow-up visits as told by your health care provider. This is important. °· If you start to feel like you might faint, lie down right away and raise (elevate) your feet above the level of your heart. Breathe deeply and steadily. Wait until all of the symptoms have passed. °· Drink enough fluid to keep your urine clear or pale yellow. °· If you are taking blood pressure or heart medicine, get up slowly and take several minutes to sit and then stand. This can reduce dizziness. °· Take over-the-counter and prescription medicines only as told by your health care provider. ° °Get help right away if: °· You have a severe headache. °· You have unusual pain in your chest, abdomen, or back. °· You are bleeding from your mouth or rectum, or you have black or tarry stool. °· You have a very fast or irregular heartbeat (palpitations). °· You faint once or repeatedly. °· You have a seizure. °· You are confused. °· You have trouble walking. °· You have  severe weakness. °· You have vision problems. °These symptoms may represent a serious problem that is an emergency. Do not wait to see if your symptoms will go away. Get medical help right away. Call your local emergency services (911 in the [place] Do not drive yourself to the hospital. °This information is not intended to replace advice given to you by your health care provider. Make sure you discuss any questions you have with your health care provider. °Document Released: 02/19/2005 Document Revised: 07/28/2015 Document Reviewed: 11/03/2014 °Elsevier Interactive Patient Education © 2017 [employer] ° °

## 2017-12-08 NOTE — MAU Provider Note (Signed)
Chief Complaint:  Headache; Dizziness; and Chest Pain   First Provider Initiated Contact with Patient 12/08/17 [phone]     HPI: Tasha Wu is a 27 y.o. [id number] at [redacted]w[redacted]d who presents to maternity admissions reporting two episodes of dizziness and near-syncope and SOB, chest pressure that have been apparent the past few day. Initially denied similar Sx, but upon review of records pt was evaluated for same in 2014. EKG at that time showed incomplete RBBB and was considered unremarkable.  Sx occurred at work when walking at a [employer]. Had to sit down when Sx occurred. Mild improvement. Still having SOB at rest while sitting in bed in MAU. Pt only had crackers for breakfast because she felt tired and weak. No N/V.   Modifying factors: Mild improvement w/ sitting, lying down. Associated signs and symptoms: Neg for fever, chills, N/V, VB, LOF, palpitations, leg swelling. No recent travel or immobilization. Rare contractions. Good fetal movement.   Gets prenatal care at [facility].   Past Medical History:  Diagnosis Date  . Abnormal Pap smear 12/10/2011  . Anemia   . Anxiety 2010   anxiety attack;no meds;has not been dx'd  . Emotional instability (HCC)    Break down easily when things are not going her way  . GERD (gastroesophageal reflux disease)   . Hx of joint problems    Shoulders and knees have pain  . Infection    Yeast;would get frequently  . Infection    BV x 1  . Kidney infection 2010   PO antibxs  . Kidney stone complicating pregnancy   . Migraines    no rx'd meds in the past;goes to sleep  . PCOS (polycystic ovarian syndrome)   . Sickle cell trait (HCC)   . Umbilical hernia 1991   Does not cause anyp problems  . Vaginal Pap smear, abnormal    OB History  Gravida Para Term Preterm AB Living  4 2 2   1 2   SAB TAB Ectopic Multiple Live Births          2    # Outcome Date GA Lbr Len/2nd Weight Sex Delivery Anes PTL Lv  4 Current           3 Term 12/28/13 [redacted]w[redacted]d 04:50 / 00:14  3800 g F Vag-Spont EPI  LIV  2 Term 06/18/12 [redacted]w[redacted]d 07:42 / 00:52 3005 g F Vag-Spont EPI  LIV  1 AB 2009 [redacted]w[redacted]d            Birth Comments: No complications   Past Surgical History:  Procedure Laterality Date  . DILATION AND CURETTAGE OF UTERUS  2009   No complications  . WISDOM TOOTH EXTRACTION     Family History  Problem Relation Age of Onset  . Sickle cell trait Mother   . Hypertension Mother   . Anemia Mother   . Sickle cell trait Brother   . Sickle cell anemia Maternal Aunt        Recently deceased from [employer] crisis  . Sickle cell anemia Maternal Grandmother        Deceased  . Heart disease Maternal Aunt   . Asthma Sister        1/2 sibling;father's child  . Hyperthyroidism Paternal Grandmother   . Scoliosis Paternal Grandmother   . Arthritis Paternal Grandmother   . Migraines Father   . Other Father        Joint Problems;shoulders and knees  . Hernia Maternal Aunt  Umbilical  . Hypertension Maternal Uncle   . Diabetes Maternal Aunt   . Diabetes type I Cousin        maternal  . Diabetes Paternal Grandfather    Social History   Tobacco Use  . Smoking status: Former Smoker    Packs/day: 0.25    Types: Cigarettes    Last attempt to quit: 11/18/2011    Years since quitting: 6.0  . Smokeless tobacco: Never Used  Substance Use Topics  . Alcohol use: Not Currently    Comment: occ  . Drug use: No   No Known Allergies No medications prior to admission.    I have reviewed patient's Past Medical Hx, Surgical Hx, Family Hx, Social Hx, medications and allergies.   ROS:  Review of Systems  Constitutional: Positive for fatigue. Negative for chills, diaphoresis and fever.  Respiratory: Positive for shortness of breath. Negative for cough and wheezing.        Chest pressure  Cardiovascular: Negative for chest pain, palpitations and leg swelling.  Gastrointestinal: Negative for abdominal pain, nausea and vomiting.  Genitourinary: Negative for vaginal bleeding.   Neurological: Positive for dizziness, weakness and light-headedness. Negative for syncope, speech difficulty, numbness and headaches.    Physical Exam   No data found. Constitutional: Well-developed, well-nourished female in no acute distress.  Skin: No pallor or diaphoresis. Cardiovascular: normal rate and rhythm. No M/R/G Respiratory: normal effort. CTAB.  GI: Abd gravid appropriate for gestational age.  MS: Extremities nontender, no edema, normal ROM. Neg Homan's  Neurologic: Alert and oriented x 4.  GU: deferred  FHT:  Baseline 140-160. Initially minimal variability, borderline tachycardia that resolved w/ PO fluids and poition change. Now moderate variability, accelerations present, few mild varibiable decelerations.  Contractions: rare, painless.    Labs: No results found for this or any previous visit (from the past 24 hour(s)).  Imaging:  Ct Angio Chest Pe W And/or Wo Contrast  Result Date: 12/08/2017 CLINICAL DATA:  Shortness of breath. Patient shielded due to pregnancy. EXAM: CT ANGIOGRAPHY CHEST WITH CONTRAST TECHNIQUE: Multidetector CT imaging of the chest was performed using the standard protocol during bolus administration of intravenous contrast. Multiplanar CT image reconstructions and MIPs were obtained to evaluate the vascular anatomy. CONTRAST:  ISOVUE-370 IOPAMIDOL (ISOVUE-370) INJECTION 76% COMPARISON:  None. FINDINGS: Cardiovascular: No filling defects in the pulmonary arteries to suggest pulmonary emboli. Heart is normal size. Aorta is normal caliber. Mediastinum/Nodes: No mediastinal, hilar, or axillary adenopathy. Lungs/Pleura: Calcified granuloma in the left lower lobe. No confluent opacities or effusions. Upper Abdomen: Imaging into the upper abdomen shows no acute findings. Musculoskeletal: Chest wall soft tissues are unremarkable. No acute bony abnormality. Review of the MIP images confirms the above findings. IMPRESSION: No evidence of pulmonary embolus.  Small calcified granuloma in the left lower lobe. No active cardiopulmonary disease. Electronically Signed   By: Charlett Nose M.D.   On: 12/08/2017 13:51    MAU Course: Orders Placed This Encounter  Procedures  . CT Angio Chest PE W and/or Wo Contrast  . Urinalysis, Routine w reflex microscopic  . CBC  . Comprehensive metabolic panel  . Troponin I  . EKG 12-Lead  . Discharge patient   Meds ordered this encounter  Medications  . iopamidol (ISOVUE-370) 76 % injection 100 mL   Discussed Hx, labs, EKG, exam w/ Cardiologist. EKG similar to previous EKG, but now shows evidence of right heart strain which raises concern for PE. Discussed obtaining d-dimer, but also that it is  of limited utility 2/2 increase in pregnancy. No concern for ischemia on EKG. Also discussed getting Echo. Discussed w/ Dr. Erin Fulling. Recommends against D-dimer first and going straight to CT.  May get Echo OP.   No evidence of pulmonary embolus or active cardiopulmonary disease per CT. Pt having some improvement in Sx w/ PO hydration and snack. Able to walk w/out significant SOB.   MDM: - SOB and chest pressure likely 2/2 combination of RBBB, mild anemia and poor PO intake. Stable for D/C. Discussed recommendation of cardiologist for Echo w/ [facility] CNM.   Assessment: 1. Incomplete right bundle branch block   2. Shortness of breath during pregnancy   3. Near syncope     Plan: Discharge home in stable condition per consult w/ Dr. Erin Fulling.  Preterm Labor precautions and fetal kick counts Recommend iron supplement, iron-rich foods, frequent meals/snacks and staying well-hydrated. Change positions slowly. Sit down or lie down if Sx return.  Follow-up Information    [facility] Follow up.   Specialty:  Obstetrics and Gynecology Contact information: [address] [place] [address] [phone]       [facility]  Follow up.   Why:  as needed in pregnancy emergencies Contact information: [address] [id number] mc [place] [address] [phone]       [facility] EMERGENCY DEPARTMENT .   Specialty:  Emergency Medicine Contact information: [address] [id number] Wilhemina Bonito [place] [address] [phone]          Allergies as of 12/08/2017   No Known Allergies     Medication List    TAKE these medications   fluticasone 50 MCG/ACT nasal spray Commonly known as:  FLONASE Place 2 sprays into both nostrils daily.       Katrinka Blazing, [place], CNM 12/12/2017 9:39 PM

## 2017-12-08 NOTE — MAU Note (Addendum)
Felt dizzy this morning at work around 730. Also had some SOB  Reports vision got hazy   States her chest feels heavy and that she has a headache. Reports chest pressure is an ongoing issue

## 2017-12-21 DIAGNOSIS — D649 Anemia, unspecified: Secondary | ICD-10-CM | POA: Insufficient documentation

## 2018-01-22 LAB — OB RESULTS CONSOLE GBS: GBS: NEGATIVE

## 2018-02-09 ENCOUNTER — Encounter (HOSPITAL_COMMUNITY): Payer: Self-pay | Admitting: *Deleted

## 2018-02-09 ENCOUNTER — Inpatient Hospital Stay (HOSPITAL_COMMUNITY)
Admission: AD | Admit: 2018-02-09 | Discharge: 2018-02-09 | Disposition: A | Discharge status: Home / Self Care | Payer: 59 | Source: Ambulatory Visit | Attending: Obstetrics & Gynecology | Admitting: Obstetrics & Gynecology

## 2018-02-09 DIAGNOSIS — O471 False labor at or after 37 completed weeks of gestation: Secondary | ICD-10-CM | POA: Insufficient documentation

## 2018-02-09 DIAGNOSIS — Z3A37 37 weeks gestation of pregnancy: Secondary | ICD-10-CM | POA: Diagnosis not present

## 2018-02-09 DIAGNOSIS — O479 False labor, unspecified: Secondary | ICD-10-CM

## 2018-02-09 NOTE — MAU Note (Signed)
Pt presents to MAU c/o frequent contraction pt states she is unsure of the timing. Pt reports no bleeding or LOF. +FM.

## 2018-02-09 NOTE — Discharge Instructions (Signed)
Braxton Hicks Contractions °Contractions of the uterus can occur throughout pregnancy, but they are not always a sign that you are in labor. You may have practice contractions called Braxton Hicks contractions. These false labor contractions are sometimes confused with true labor. °What are Braxton Hicks contractions? °Braxton Hicks contractions are tightening movements that occur in the muscles of the uterus before labor. Unlike true labor contractions, these contractions do not result in opening (dilation) and thinning of the cervix. Toward the end of pregnancy (32-34 weeks), Braxton Hicks contractions can happen more often and may become stronger. These contractions are sometimes difficult to tell apart from true labor because they can be very uncomfortable. You should not feel embarrassed if you go to the hospital with false labor. °Sometimes, the only way to tell if you are in true labor is for your health care provider to look for changes in the cervix. The health care provider will do a physical exam and may monitor your contractions. If you are not in true labor, the exam should show that your cervix is not dilating and your water has not broken. °If there are other health problems associated with your pregnancy, it is completely safe for you to be sent home with false labor. You may continue to have Braxton Hicks contractions until you go into true labor. °How to tell the difference between true labor and false labor °True labor °· Contractions last 30-70 seconds. °· Contractions become very regular. °· Discomfort is usually felt in the top of the uterus, and it spreads to the lower abdomen and low back. °· Contractions do not go away with walking. °· Contractions usually become more intense and increase in frequency. °· The cervix dilates and gets thinner. °False labor °· Contractions are usually shorter and not as strong as true labor contractions. °· Contractions are usually irregular. °· Contractions  are often felt in the front of the lower abdomen and in the groin. °· Contractions may go away when you walk around or change positions while lying down. °· Contractions get weaker and are shorter-lasting as time goes on. °· The cervix usually does not dilate or become thin. °Follow these instructions at home: °· Take over-the-counter and prescription medicines only as told by your health care provider. °· Keep up with your usual exercises and follow other instructions from your health care provider. °· Eat and drink lightly if you think you are going into labor. °· If Braxton Hicks contractions are making you uncomfortable: °? Change your position from lying down or resting to walking, or change from walking to resting. °? Sit and rest in a tub of warm water. °? Drink enough fluid to keep your urine pale yellow. Dehydration may cause these contractions. °? Do slow and deep breathing several times an hour. °· Keep all follow-up prenatal visits as told by your health care provider. This is important. °Contact a health care provider if: °· You have a fever. °· You have continuous pain in your abdomen. °Get help right away if: °· Your contractions become stronger, more regular, and closer together. °· You have fluid leaking or gushing from your vagina. °· You pass blood-tinged mucus (bloody show). °· You have bleeding from your vagina. °· You have low back pain that you never had before. °· You feel your baby’s head pushing down and causing pelvic pressure. °· Your baby is not moving inside you as much as it used to. °Summary °· Contractions that occur before labor are called Braxton   Hicks contractions, false labor, or practice contractions. °· Braxton Hicks contractions are usually shorter, weaker, farther apart, and less regular than true labor contractions. True labor contractions usually become progressively stronger and regular and they become more frequent. °· Manage discomfort from Braxton Hicks contractions by  changing position, resting in a warm bath, drinking plenty of water, or practicing deep breathing. °This information is not intended to replace advice given to you by your health care provider. Make sure you discuss any questions you have with your health care provider. °Document Released: 07/05/2016 Document Revised: 07/05/2016 Document Reviewed: 07/05/2016 °Elsevier Interactive Patient Education © 2018 Else[employer] Inc[employer] ° °Fetal Movement Counts °Patient Name: ________________________________________________ Patient Due Date: ____________________ °What is a fetal movement count? °A fetal movement count is the number of times that you feel your baby move during a certain amount of time. This may also be called a fetal kick count. A fetal movement count is recommended for every pregnant woman. You may be asked to start counting fetal movements as early as week 28 of your pregnancy. °Pay attention to when your baby is most active. You may notice your baby's sleep and wake cycles. You may also notice things that make your baby move more. You should do a fetal movement count: °· When your baby is normally most active. °· At the same time each day. ° °A good time to count movements is while you are resting, after having something to eat and drink. °How do I count fetal movements? °1. Find a quiet, comfortable area. Sit, or lie down on your side. °2. Write down the date, the start time and stop time, and the number of movements that you felt between those two times. Take this information with you to your health care visits. °3. For 2 hours, count kicks, flutters, swishes, rolls, and jabs. You should feel at least 10 movements during 2 hours. °4. You may stop counting after you have felt 10 movements. °5. If you do not feel 10 movements in 2 hours, have something to eat and drink. Then, keep resting and counting for 1 hour. If you feel at least 4 movements during that hour, you may stop counting. °Contact a health care  provider if: °· You feel fewer than 4 movements in 2 hours. °· Your baby is not moving like he or she usually does. °Date: ____________ Start time: ____________ Stop time: ____________ Movements: ____________ °Date: ____________ Start time: ____________ Stop time: ____________ Movements: ____________ °Date: ____________ Start time: ____________ Stop time: ____________ Movements: ____________ °Date: ____________ Start time: ____________ Stop time: ____________ Movements: ____________ °Date: ____________ Start time: ____________ Stop time: ____________ Movements: ____________ °Date: ____________ Start time: ____________ Stop time: ____________ Movements: ____________ °Date: ____________ Start time: ____________ Stop time: ____________ Movements: ____________ °Date: ____________ Start time: ____________ Stop time: ____________ Movements: ____________ °Date: ____________ Start time: ____________ Stop time: ____________ Movements: ____________ °This information is not intended to replace advice given to you by your health care provider. Make sure you discuss any questions you have with your health care provider. °Document Released: 03/21/2006 Document Revised: 10/19/2015 Document Reviewed: 03/31/2015 °Elsevier Interactive Patient Education © 2018 Elsevier Inc. ° °

## 2018-02-09 NOTE — MAU Provider Note (Signed)
    S: [name] is a 27 y.o. [phone] at 2274w6d  who presents to MAU today complaining contractions with irregular pattern. She denies vaginal bleeding. She denies LOF. She reports normal fetal movement.    O: BP 106/60 (BP Location: Left Arm)   Pulse 73   Temp 98 F (36.7 C) (Oral)   Resp 18   LMP 05/20/2017  GENERAL: Well-developed, well-nourished female in no acute distress.  HEAD: Normocephalic, atraumatic.  CHEST: Normal effort of breathing, regular heart rate ABDOMEN: Soft, nontender, gravid  Cervical exam:  Dilation: 2.5 Effacement (%): 50 Station: -2 Presentation: Vertex Exam by:: Everlene FarrierHilary Hardy RN    Fetal Monitoring: Baseline: 120 bpm Variability: moderate  Accelerations: 15x15 Decelerations: none Contractions: UI   A: SIUP at 5274w6d  False labor  P: Labor precautions Return to MAU if symptoms worsen   Venia Carbonasch, Jennifer I, NP 02/09/2018 6:03 AM

## 2018-02-19 ENCOUNTER — Encounter (HOSPITAL_COMMUNITY): Payer: Self-pay | Admitting: *Deleted

## 2018-02-19 ENCOUNTER — Telehealth (HOSPITAL_COMMUNITY): Payer: Self-pay | Admitting: *Deleted

## 2018-02-19 NOTE — Telephone Encounter (Signed)
Preadmission screen  

## 2018-02-21 ENCOUNTER — Encounter (HOSPITAL_COMMUNITY): Payer: Self-pay

## 2018-02-21 ENCOUNTER — Inpatient Hospital Stay (HOSPITAL_COMMUNITY): Payer: 59 | Admitting: Anesthesiology

## 2018-02-21 ENCOUNTER — Inpatient Hospital Stay (HOSPITAL_COMMUNITY)
Admission: AD | Admit: 2018-02-21 | Discharge: 2018-02-23 | DRG: 807 | Disposition: A | Discharge status: Home / Self Care | Payer: 59 | Attending: Obstetrics & Gynecology | Admitting: Obstetrics & Gynecology

## 2018-02-21 DIAGNOSIS — O9902 Anemia complicating childbirth: Secondary | ICD-10-CM | POA: Diagnosis present

## 2018-02-21 DIAGNOSIS — D573 Sickle-cell trait: Secondary | ICD-10-CM | POA: Diagnosis present

## 2018-02-21 DIAGNOSIS — Z87891 Personal history of nicotine dependence: Secondary | ICD-10-CM

## 2018-02-21 DIAGNOSIS — Z3A39 39 weeks gestation of pregnancy: Secondary | ICD-10-CM

## 2018-02-21 DIAGNOSIS — Z3483 Encounter for supervision of other normal pregnancy, third trimester: Secondary | ICD-10-CM | POA: Diagnosis present

## 2018-02-21 LAB — CBC
HCT: 25.6 % — ABNORMAL LOW (ref 36.0–46.0)
Hemoglobin: 8.4 g/dL — ABNORMAL LOW (ref 12.0–15.0)
MCH: 24 pg — ABNORMAL LOW (ref 26.0–34.0)
MCHC: 32.8 g/dL (ref 30.0–36.0)
MCV: 73.1 fL — ABNORMAL LOW (ref 80.0–100.0)
Platelets: 258 10*3/uL (ref 150–400)
RBC: 3.5 MIL/uL — ABNORMAL LOW (ref 3.87–5.11)
RDW: 13.9 % (ref 11.5–15.5)
WBC: 9 10*3/uL (ref 4.0–10.5)
nRBC: 0 % (ref 0.0–0.2)

## 2018-02-21 LAB — TYPE AND SCREEN
ABO/RH(D): O POS
Antibody Screen: NEGATIVE

## 2018-02-21 LAB — POCT FERN TEST: POCT Fern Test: POSITIVE

## 2018-02-21 LAB — ABO/RH: ABO/RH(D): O POS

## 2018-02-21 LAB — RPR: RPR Ser Ql: NONREACTIVE

## 2018-02-21 MED ORDER — BENZOCAINE-MENTHOL 20-0.5 % EX AERO: 1.0000 "application " | INHALATION_SPRAY | CUTANEOUS | Status: DC | PRN

## 2018-02-21 MED ORDER — ACETAMINOPHEN 325 MG PO TABS: 650.0000 mg | ORAL_TABLET | ORAL | Status: DC | PRN

## 2018-02-21 MED ORDER — ONDANSETRON HCL 4 MG/2ML IJ SOLN: 4.0000 mg | Freq: Four times a day (QID) | INTRAMUSCULAR | Status: DC | PRN

## 2018-02-21 MED ORDER — LACTATED RINGERS IV SOLN: 500.0000 mL | Freq: Once | INTRAVENOUS | Status: DC

## 2018-02-21 MED ORDER — FERROUS SULFATE 325 (65 FE) MG PO TABS
325.0000 mg | ORAL_TABLET | Freq: Every day | ORAL | Status: DC
  Administered 2018-02-22 – 2018-02-23 (×2): 325 mg via ORAL
  Filled 2018-02-21 (×2): qty 1

## 2018-02-21 MED ORDER — OXYCODONE-ACETAMINOPHEN 5-325 MG PO TABS: 2.0000 | ORAL_TABLET | ORAL | Status: DC | PRN

## 2018-02-21 MED ORDER — COCONUT OIL OIL
1.0000 "application " | TOPICAL_OIL | Status: DC | PRN
  Administered 2018-02-22: 1 via TOPICAL
  Filled 2018-02-21: qty 120

## 2018-02-21 MED ORDER — LACTATED RINGERS IV SOLN: 500.0000 mL | INTRAVENOUS | Status: DC | PRN

## 2018-02-21 MED ORDER — PHENYLEPHRINE 40 MCG/ML (10ML) SYRINGE FOR IV PUSH (FOR BLOOD PRESSURE SUPPORT)
80.0000 ug | PREFILLED_SYRINGE | INTRAVENOUS | Status: DC | PRN
  Filled 2018-02-21 (×2): qty 10

## 2018-02-21 MED ORDER — LACTATED RINGERS IV SOLN
INTRAVENOUS | Status: DC
  Administered 2018-02-21 (×2): via INTRAVENOUS

## 2018-02-21 MED ORDER — OXYTOCIN 40 UNITS IN LACTATED RINGERS INFUSION - SIMPLE MED
1.0000 m[IU]/min | INTRAVENOUS | Status: DC
  Administered 2018-02-21: 1 m[IU]/min via INTRAVENOUS
  Filled 2018-02-21: qty 1000

## 2018-02-21 MED ORDER — ONDANSETRON HCL 4 MG/2ML IJ SOLN: 4.0000 mg | INTRAMUSCULAR | Status: DC | PRN

## 2018-02-21 MED ORDER — IBUPROFEN 600 MG PO TABS
600.0000 mg | ORAL_TABLET | Freq: Four times a day (QID) | ORAL | Status: DC
  Administered 2018-02-21 – 2018-02-23 (×9): 600 mg via ORAL
  Filled 2018-02-21 (×9): qty 1

## 2018-02-21 MED ORDER — FENTANYL 2.5 MCG/ML BUPIVACAINE 1/10 % EPIDURAL INFUSION (WH - ANES)
14.0000 mL/h | INTRAMUSCULAR | Status: DC | PRN
  Administered 2018-02-21: 14 mL/h via EPIDURAL
  Filled 2018-02-21: qty 100

## 2018-02-21 MED ORDER — LACTATED RINGERS IV SOLN
500.0000 mL | Freq: Once | INTRAVENOUS | Status: AC
  Administered 2018-02-21: 500 mL via INTRAVENOUS

## 2018-02-21 MED ORDER — LIDOCAINE HCL (PF) 1 % IJ SOLN
30.0000 mL | INTRAMUSCULAR | Status: DC | PRN
  Filled 2018-02-21: qty 30

## 2018-02-21 MED ORDER — EPHEDRINE 5 MG/ML INJ
10.0000 mg | INTRAVENOUS | Status: DC | PRN
  Filled 2018-02-21: qty 2

## 2018-02-21 MED ORDER — DIPHENHYDRAMINE HCL 50 MG/ML IJ SOLN: 12.5000 mg | INTRAMUSCULAR | Status: DC | PRN

## 2018-02-21 MED ORDER — DIBUCAINE 1 % RE OINT: 1.0000 "application " | TOPICAL_OINTMENT | RECTAL | Status: DC | PRN

## 2018-02-21 MED ORDER — PRENATAL MULTIVITAMIN CH
1.0000 | ORAL_TABLET | Freq: Every day | ORAL | Status: DC
  Administered 2018-02-21 – 2018-02-23 (×3): 1 via ORAL
  Filled 2018-02-21 (×3): qty 1

## 2018-02-21 MED ORDER — OXYTOCIN BOLUS FROM INFUSION
500.0000 mL | Freq: Once | INTRAVENOUS | Status: AC
  Administered 2018-02-21: 500 mL via INTRAVENOUS

## 2018-02-21 MED ORDER — OXYCODONE-ACETAMINOPHEN 5-325 MG PO TABS: 1.0000 | ORAL_TABLET | ORAL | Status: DC | PRN

## 2018-02-21 MED ORDER — FENTANYL CITRATE (PF) 100 MCG/2ML IJ SOLN
50.0000 ug | INTRAMUSCULAR | Status: DC | PRN
  Administered 2018-02-21 (×2): 100 ug via INTRAVENOUS
  Filled 2018-02-21 (×2): qty 2

## 2018-02-21 MED ORDER — ONDANSETRON HCL 4 MG PO TABS: 4.0000 mg | ORAL_TABLET | ORAL | Status: DC | PRN

## 2018-02-21 MED ORDER — LIDOCAINE HCL (PF) 1 % IJ SOLN
INTRAMUSCULAR | Status: DC | PRN
  Administered 2018-02-21 (×2): 5 mL via EPIDURAL

## 2018-02-21 MED ORDER — SIMETHICONE 80 MG PO CHEW: 80.0000 mg | CHEWABLE_TABLET | ORAL | Status: DC | PRN

## 2018-02-21 MED ORDER — TETANUS-DIPHTH-ACELL PERTUSSIS 5-2.5-18.5 LF-MCG/0.5 IM SUSP: 0.5000 mL | Freq: Once | INTRAMUSCULAR | Status: DC

## 2018-02-21 MED ORDER — ZOLPIDEM TARTRATE 5 MG PO TABS: 5.0000 mg | ORAL_TABLET | Freq: Every evening | ORAL | Status: DC | PRN

## 2018-02-21 MED ORDER — SOD CITRATE-CITRIC ACID 500-334 MG/5ML PO SOLN: 30.0000 mL | ORAL | Status: DC | PRN

## 2018-02-21 MED ORDER — TERBUTALINE SULFATE 1 MG/ML IJ SOLN
0.2500 mg | Freq: Once | INTRAMUSCULAR | Status: DC | PRN
  Filled 2018-02-21: qty 1

## 2018-02-21 MED ORDER — WITCH HAZEL-GLYCERIN EX PADS: 1.0000 "application " | MEDICATED_PAD | CUTANEOUS | Status: DC | PRN

## 2018-02-21 MED ORDER — ACETAMINOPHEN 325 MG PO TABS
650.0000 mg | ORAL_TABLET | ORAL | Status: DC | PRN
  Administered 2018-02-21: 650 mg via ORAL
  Filled 2018-02-21: qty 2

## 2018-02-21 MED ORDER — SENNOSIDES-DOCUSATE SODIUM 8.6-50 MG PO TABS
2.0000 | ORAL_TABLET | ORAL | Status: DC
  Administered 2018-02-21 – 2018-02-23 (×2): 2 via ORAL
  Filled 2018-02-21 (×2): qty 2

## 2018-02-21 MED ORDER — DIPHENHYDRAMINE HCL 25 MG PO CAPS: 25.0000 mg | ORAL_CAPSULE | Freq: Four times a day (QID) | ORAL | Status: DC | PRN

## 2018-02-21 MED ORDER — OXYTOCIN 40 UNITS IN LACTATED RINGERS INFUSION - SIMPLE MED: 2.5000 [IU]/h | INTRAVENOUS | Status: DC

## 2018-02-21 NOTE — H&P (Signed)
Tasha Wu is a 27 y.o. female, [id number] at 39.4 weeks, presenting for SROM clear fluid at 0105.  FM +.  Prenatal hx remarkable for SS trait, FOB unknown.  Hx of anxiety, no current medications.  Patient Active Problem List   Diagnosis Date Noted  . Active labor at term 12/28/2013  . Post term pregnancy, antepartum condition or complication 06/18/2012  . Abnormal Pap smear 12/10/2011  . Umbilical hernia 11/29/2011  . H/O pyelonephritis 11/29/2011    History of present pregnancy: Patient entered care 11.3 weeks.   EDC of 12/23 was established by LMP.   Anatomy scan: 20 weeks, with normal findings and an post placenta. Mild ptyalectasis, resolved at 24 weeks  Additional US evaluations:  Growth wnl last US at 32 week 34 %  Significant prenatal events:  None  Last evaluation:  This week  OB History    Gravida  4   Para  2   Term  2   Preterm      AB  1   Living  2     SAB      TAB      Ectopic      Multiple      Live Births  2          Past Medical History:  Diagnosis Date  . Abnormal Pap smear 12/10/2011  . Anemia   . Anxiety 2010   anxiety attack;no meds;has not been dx'd  . Emotional instability (HCC)    Break down easily when things are not going her way  . GERD (gastroesophageal reflux disease)   . Hx of joint problems    Shoulders and knees have pain  . Infection    Yeast;would get frequently  . Infection    BV x 1  . Kidney infection 2010   PO antibxs  . Kidney stone complicating pregnancy   . Migraines    no rx'd meds in the past;goes to sleep  . PCOS (polycystic ovarian syndrome)   . Sickle cell trait (HCC)   . Umbilical hernia 1991   Does not cause anyp problems  . Vaginal Pap smear, abnormal    Past Surgical History:  Procedure Laterality Date  . DILATION AND CURETTAGE OF UTERUS  2009   No complications  . WISDOM TOOTH EXTRACTION     Family History: family history includes Anemia in her mother; Arthritis in her paternal  grandmother; Asthma in her sister; Diabetes in her maternal aunt and paternal grandfather; Diabetes type I in her cousin; Heart disease in her maternal aunt; Hernia in her maternal aunt; Hypertension in her maternal uncle and mother; Hyperthyroidism in her paternal grandmother; Migraines in her father; Other in her father; Scoliosis in her paternal grandmother; Sickle cell anemia in her maternal aunt and maternal grandmother; Sickle cell trait in her brother and mother. Social History:  reports that she quit smoking about 6 years ago. Her smoking use included cigarettes. She smoked 0.25 packs per day. She has never used smokeless tobacco. She reports previous alcohol use. She reports that she does not use drugs.   Prenatal Transfer Tool  Maternal Diabetes: No Genetic Screening: Normal Maternal Ultrasounds/Referrals: Normal Fetal Ultrasounds or other Referrals:  None Maternal Substance Abuse:  No Significant Maternal Medications:  None Significant Maternal Lab Results: None  TDAP Yes Flu No  ROS:  All 10 systems reviewed and negative except as stated above  No Known Allergies   Dilation: 3 Effacement (%): 70 Station: -2 Exam by::  Latricia HeftAnna Cioce, RN Blood pressure 113/67, pulse 96, temperature 97.8 F (36.6 C), resp. rate 19, height 5' 6.75" (1.695 m), weight 74.4 kg, last menstrual period 05/20/2017.  Chest clear Heart RRR without murmur Abd gravid, NT, FH appropriate Pelvic: per RN Ext:   FHR: Category 1 FHT 140 accels,no decels, variability present UCs:  irregular  Prenatal labs: ABO, Rh: O/Positive/-- (05/20 0000) Antibody: Negative (05/20 0000) Rubella:  Immune (05/20 0000) RPR: Nonreactive (05/20 0000)  HBsAg: Negative (05/20 0000)  HIV: Non-reactive (05/20 0000)  GBS: Negative (11/20 0000) Sickle cell/Hgb electrophoresis:  AS Pap:  2019 ASCUS HPV negative GC:  neg Chlamydia:  Neg Genetic screenings:   NIPT wnl Glucola:  86 Other:   Hgb 111.3 at NOB, 9.1 at 28  weeks       Assessment/Plan: IUP at 39.4 IUP SROM clear fluid at 0105 with contractions Cat 1  Plan: Admit to Birthing Suite Routine [facility] orders Pain med/epidural prn   Henderson NewcomerNancy Jean ProtheroCNM, MSN 02/21/2018, 2:40 AM

## 2018-02-21 NOTE — Anesthesia Procedure Notes (Signed)
Epidural Patient location during procedure: OB Start time: 02/21/2018 6:11 AM End time: 02/21/2018 6:20 AM  Staffing Anesthesiologist: Achille RichHodierne, Ziggy Chanthavong, MD Performed: anesthesiologist   Preanesthetic Checklist Completed: patient identified, site marked, pre-op evaluation, timeout performed, IV checked, risks and benefits discussed and monitors and equipment checked  Epidural Patient position: sitting Prep: DuraPrep Patient monitoring: heart rate, cardiac monitor, continuous pulse ox and blood pressure Approach: midline Location: L2-L3 Injection technique: LOR saline  Needle:  Needle type: Tuohy  Needle gauge: 17 G Needle length: 9 cm Needle insertion depth: 5 cm Catheter type: closed end flexible Catheter size: 19 Gauge Catheter at skin depth: 11 cm Test dose: negative and Other  Assessment Events: blood not aspirated, injection not painful, no injection resistance and negative IV test  Additional Notes Informed consent obtained prior to proceeding including risk of failure, 1% risk of PDPH, risk of minor discomfort and bruising.  Discussed rare but serious complications including epidural abscess, permanent nerve injury, epidural hematoma.  Discussed alternatives to epidural analgesia and patient desires to proceed.  Timeout performed pre-procedure verifying patient name, procedure, and platelet count.  Patient tolerated procedure well. Reason for block:procedure for pain

## 2018-02-21 NOTE — MAU Note (Signed)
SROM at 0105-clear fluid.  No VB.  Hadn't felt baby move since 1 hour before SROM- felt movement when RN dopplered FHT.  Reports feeling pressure but no ctx.

## 2018-02-21 NOTE — Anesthesia Preprocedure Evaluation (Signed)
Anesthesia Evaluation  Patient identified by MRN, date of birth, ID band Patient awake    Reviewed: Allergy & Precautions, H&P , NPO status , Patient's Chart, lab work & pertinent test results  Airway Mallampati: II   Neck ROM: full    Dental   Pulmonary former smoker,    breath sounds clear to auscultation       Cardiovascular negative cardio ROS   Rhythm:regular Rate:Normal     Neuro/Psych  Headaches, PSYCHIATRIC DISORDERS Anxiety    GI/Hepatic GERD  ,  Endo/Other    Renal/GU      Musculoskeletal   Abdominal   Peds  Hematology  (+) Blood dyscrasia, Sickle cell trait and anemia ,   Anesthesia Other Findings   Reproductive/Obstetrics                             Anesthesia Physical Anesthesia Plan  ASA: II  Anesthesia Plan: Epidural   Post-op Pain Management:    Induction: Intravenous  PONV Risk Score and Plan: 2 and Treatment may vary due to age or medical condition  Airway Management Planned: Natural Airway  Additional Equipment:   Intra-op Plan:   Post-operative Plan:   Informed Consent: I have reviewed the patients History and Physical, chart, labs and discussed the procedure including the risks, benefits and alternatives for the proposed anesthesia with the patient or authorized representative who has indicated his/her understanding and acceptance.     Plan Discussed with: Anesthesiologist  Anesthesia Plan Comments:         Anesthesia Quick Evaluation

## 2018-02-22 ENCOUNTER — Encounter (HOSPITAL_COMMUNITY): Payer: Self-pay | Admitting: *Deleted

## 2018-02-22 LAB — CBC
HCT: 24.7 % — ABNORMAL LOW (ref 36.0–46.0)
HEMOGLOBIN: 8 g/dL — AB (ref 12.0–15.0)
MCH: 23.7 pg — ABNORMAL LOW (ref 26.0–34.0)
MCHC: 32.4 g/dL (ref 30.0–36.0)
MCV: 73.3 fL — ABNORMAL LOW (ref 80.0–100.0)
NRBC: 0 % (ref 0.0–0.2)
Platelets: 202 10*3/uL (ref 150–400)
RBC: 3.37 MIL/uL — ABNORMAL LOW (ref 3.87–5.11)
RDW: 13.9 % (ref 11.5–15.5)
WBC: 11.2 10*3/uL — ABNORMAL HIGH (ref 4.0–10.5)

## 2018-02-22 MED ORDER — OXYCODONE-ACETAMINOPHEN 5-325 MG PO TABS
1.0000 | ORAL_TABLET | ORAL | Status: DC | PRN
  Administered 2018-02-22 – 2018-02-23 (×2): 1 via ORAL
  Filled 2018-02-22: qty 1
  Filled 2018-02-22: qty 2

## 2018-02-22 NOTE — Progress Notes (Addendum)
Post Partum Day 1 Subjective: up ad lib, voiding, tolerating PO and complaining of fatigue, stating she did not sleep well.  Objective: Vitals:   02/21/18 1538 02/21/18 2029 02/22/18 0035 02/22/18 0615  BP: 116/71 111/77 110/79 103/79  Pulse: 82 65 62 65  Resp:   20 18  Temp:  97.7 F (36.5 C)  97.7 F (36.5 C)  TempSrc:  Oral  Oral  SpO2:  100%    Weight:      Height:        Physical Exam:  General: alert and cooperative Lochia: appropriate Uterine Fundus: firm Incision: n/a DVT Evaluation: No evidence of DVT seen on physical exam. Negative Homan's sign. No cords or calf tenderness. No significant calf/ankle edema.  Recent Labs    02/21/18 0221 02/22/18 0523  HGB 8.4* 8.0*  HCT 25.6* 24.7*    Assessment/Plan: Plan for discharge tomorrow, Breastfeeding and Circumcision prior to discharge. Mild asymptomatic anemia. Will continue with PO Iron QD.    LOS: 1 day   Janeece Riggersllis K Adaisha Campise 02/22/2018, 9:51 AM

## 2018-02-22 NOTE — Lactation Note (Signed)
This note was copied from a baby's chart. Lactation Consultation Note  Patient Name: Tasha Wu WUJWJ'XToday's Date: 02/22/2018 Reason for consult: Initial assessment;Term P3, 18 hour female infant. Mother's feeding choice upon admission was breast and supplementing with formula. Per mom, has DEBP at home. BF concerns: pain with latch earlier today, infant on phototherapy and mom with previous hx of  breastfeeding only for one day with her eldest daughter.   LC entered room infant on phototherapy without eye covers Nurse informed parents of importance of infant's eye being covered.  Per mom, in a lot of pain from uterine contractions and not wanting to latch infant at breast at this time but is willing to hand express. Mom with [facility] from [facility] hand expressed 10 ml of colostrum that was spoon feed to infant. LC discussed hunger cues and mom will breastfeed according hunger cues, 8 to 12 times within 24 hours including nights and not exceed 3 hours without feeding infant.  LC discussed using DEBP but mom did not response to question.  Mom is willing to latch infant at next feeding to breast. Per dad, he notice that infant is spitting up formula and Mom admitted to giving infant greater than 15 ml and feels that could be the cause.  LC discussed I & O. Mom made aware of O/P services, breastfeeding support groups, community resources, and our phone # for post-discharge questions.    BF plans within 24 hours. 1. Mom will BF or supplement according hunger cues and not exceed 3 hours without feeding infant. 2. Mom will attempt to latch infant to breast at next feeding. 3. Mom plans to breast feed more and offer less formula.   Maternal Data Formula Feeding for Exclusion: Yes Reason for exclusion: Mother's choice to formula and breast feed on admission Has patient been taught Hand Expression?: Yes(infant was given 10 ml of EBM by spoon .) Does the patient have breastfeeding experience prior  to this delivery?: Yes  Feeding Feeding Type: Bottle Fed - Formula Nipple Type: Slow - flow  LATCH Score                   Interventions    Lactation Tools Discussed/Used WIC Program: No   Consult Status Consult Status: Follow-up Date: 02/22/18 Follow-up type: In-patient    Tasha EarthlyRobin Amberly Wu 02/22/2018, 1:43 AM

## 2018-02-22 NOTE — Progress Notes (Signed)
Spoke with Dr. Normand Sloopillard regarding pt's request for stronger pain medication. Pt c/o severe abdominal cramping unrelieved by ibuprofen given 1 hour ago and heat pack. Pt states the tylenol she had at 2030 did not help with her pain. Orders received from Dr. Normand Sloopillard for percocet and to discontinue tylenol.

## 2018-02-22 NOTE — Progress Notes (Signed)
CSW acknowledges consult and completed clinical assessment.  Clinical documentation will follow.  There are no barriers to d/c.  Miesha Bachmann Boyd-Gilyard, MSW, LCSW Clinical Social Work [phone]   

## 2018-02-23 MED ORDER — IBUPROFEN 600 MG PO TABS: 600.0000 mg | ORAL_TABLET | Freq: Four times a day (QID) | ORAL | 0 refills | Status: DC

## 2018-02-23 NOTE — Lactation Note (Signed)
This note was copied from a baby's chart. Lactation Consultation Note:  Mother post pumping . She complains of slight discomfort with pumping.  Observed that mother is using a # 24 flange and flange too tight.  Mother switched to #27 flange. Reports that feels much better.  Mother pumping approx 50 ml. Mother breast filling firm and full.  Advised mother to do good breast massage to soften breast tissue.  Advised mother to use ice to decrease swelling. Mother has breastmilk labels and bottle with lids given to mother.  Discussed using pumping rooms in NICU to pump when she visits infant.  Mother has a used PIS from a friend.  Advised mother in cutting tubing if needed and checking pressures on used pump.  Mother to continue to pump every 2-3 hours for 20 mins. Advised to hand express breastmilk after each pumping. Mother was given a harmony hand pump with instructions. Mother is aware of available LC services and community support. Mother advised to phone [facility] office for concerns or questions.   Patient Name: Boy [name] Ognibene WUJWJ'XToday's Date: 02/23/2018 Reason for consult: Follow-up assessment   Maternal Data    Feeding    LATCH Score                   Interventions Interventions: Expressed milk;Hand pump  Lactation Tools Discussed/Used     Consult Status Consult Status: Complete    Michel BickersKendrick, Jahniah Pallas McCoy 02/23/2018, 10:08 AM

## 2018-02-23 NOTE — Discharge Instructions (Signed)
Home Care Instructions for Mom  Activity  · Gradually return to your regular activities.  · Let yourself rest. Nap while your baby sleeps.  · Avoid lifting anything that is heavier than 10 lb (4.5 kg) until your health care provider says it is okay.  · Avoid activities that take a lot of effort and energy (are strenuous) until approved by your health care provider. Walking at a slow-to-moderate pace is usually safe.  · If you had a cesarean delivery:  ? Do not vacuum, climb stairs, or drive a car for 4-6 weeks.  ? Have someone help you at home until you feel like you can do your usual activities yourself.  ? Do exercises as told by your health care provider, if this applies.  Vaginal bleeding  You may continue to bleed for 4-6 weeks after delivery. Over time, the amount of blood usually decreases and the color of the blood usually gets lighter. However, the flow of bright red blood may increase if you have been too active. If you need to use more than one pad in an hour because your pad gets soaked, or if you pass a large clot:  · Lie down.  · Raise your feet.  · Place a cold compress on your lower abdomen.  · Rest.  · Call your health care provider.  If you are breastfeeding, your period should return anytime between 8 weeks after delivery and the time that you stop breastfeeding. If you are not breastfeeding, your period should return 6-8 weeks after delivery.  Perineal care  The perineal area, or perineum, is the part of your body between your thighs. After delivery, this area needs special care. Follow these instructions as told by your health care provider.  · Take warm tub baths for 15-20 minutes.  · Use medicated pads and pain-relieving sprays and creams as told.  · Do not use tampons or douches until vaginal bleeding has stopped.  · Each time you go to the bathroom:  ? Use a peri bottle.  ? Change your pad.  ? Use towelettes in place of toilet paper until your stitches have healed.  · Do Kegel exercises  every day. Kegel exercises help to maintain the muscles that support the vagina, bladder, and bowels. You can do these exercises while you are standing, sitting, or lying down. To do Kegel exercises:  ? Tighten the muscles of your abdomen and the muscles that surround your birth canal.  ? Hold for a few seconds.  ? Relax.  ? Repeat until you have done this 5 times in a row.  · To prevent hemorrhoids from developing or getting worse:  ? Drink enough fluid to keep your urine clear or pale yellow.  ? Avoid straining when having a bowel movement.  ? Take over-the-counter medicines and stool softeners as told by your health care provider.  Breast care  · Wear a tight-fitting bra.  · Avoid taking over-the-counter pain medicine for breast discomfort.  · Apply ice to the breasts to help with discomfort as needed:  ? Put ice in a plastic bag.  ? Place a towel between your skin and the bag.  ? Leave the ice on for 20 minutes or as told by your health care provider.  Nutrition  · Eat a well-balanced diet.  · Do not try to lose weight quickly by cutting back on calories.  · Take your prenatal vitamins until your postpartum checkup or until your health care provider tells   you to stop.  Postpartum depression  You may find yourself crying for no apparent reason and unable to cope with all of the changes that come with having a newborn. This mood is called postpartum depression. Postpartum depression happens because your hormone levels change after delivery. If you have postpartum depression, get support from your partner, friends, and family. If the depression does not go away on its own after several weeks, contact your health care provider.  Breast self-exam    Do a breast self-exam each month, at the same time of the month. If you are breastfeeding, check your breasts just after a feeding, when your breasts are less full. If you are breastfeeding and your period has started, check your breasts on day 5, 6, or 7 of your  period.  Report any lumps, bumps, or discharge to your health care provider. Know that breasts are normally lumpy if you are breastfeeding. This is temporary, and it is not a health risk.  Intimacy and sexuality  Avoid sexual activity for at least 3-4 weeks after delivery or until the brownish-red vaginal flow is completely gone. If you want to avoid pregnancy, use some form of birth control. You can get pregnant after delivery, even if you have not had your period.  Contact a health care provider if:  · You feel unable to cope with the changes that a child brings to your life, and these feelings do not go away after several weeks.  · You notice a lump, a bump, or discharge on your breast.  Get help right away if:  · Blood soaks your pad in 1 hour or less.  · You have:  ? Severe pain or cramping in your lower abdomen.  ? A bad-smelling vaginal discharge.  ? A fever that is not controlled by medicine.  ? A fever, and an area of your breast is red and sore.  ? Pain or redness in your calf.  ? Sudden, severe chest pain.  ? Shortness of breath.  ? Painful or bloody urination.  ? Problems with your vision.  ? You vomit for 12 hours or longer.  ? You develop a severe headache.  ? You have serious thoughts about hurting yourself, your child, or anyone else.  This information is not intended to replace advice given to you by your health care provider. Make sure you discuss any questions you have with your health care provider.  Document Released: 02/17/2000 Document Revised: 04/17/2017 Document Reviewed: 08/23/2014  Elsevier Interactive Patient Education © 2019 Elsevier Inc.

## 2018-02-23 NOTE — Discharge Summary (Signed)
OB Discharge Summary     Patient Name: Tasha Wu DOB: [birth year] MRN: [id number]  Date of admission: 02/21/2018 Delivering MD: Janeece RiggersGREER, ELLIS K   Date of discharge: 02/23/2018  Admitting diagnosis: 39.4WKS WATER BROKE Intrauterine pregnancy: 8797w4d     Secondary diagnosis:  Active Problems:   Normal labor   SVD (spontaneous vaginal delivery)  Additional problems: anemia, hx of anxiety     Discharge diagnosis: Term Pregnancy Delivered                                                                                                Post partum procedures:none  Augmentation: none  Complications: None  Hospital course:  Onset of Labor With Vaginal Delivery     27 y.o. yo [id number] at 1697w4d was admitted in Active Labor on 02/21/2018. Patient had an uncomplicated labor course as follows:  Membrane Rupture Time/Date: 1:05 AM ,02/21/2018   Intrapartum Procedures: Episiotomy: None [1]                                         Lacerations:  None [1]  Patient had a delivery of a Viable infant. 02/21/2018  Information for the patient's newborn:  Jesusita OkaMeadows, Boy Tasha [[id number]][[id number]]  Delivery Method: Vaginal, Spontaneous(Filed from Delivery Summary)  Results for orders placed or performed during the hospital encounter of 02/21/18 (from the past 48 hour(s))  CBC     Status: Abnormal   Collection Time: 02/22/18  5:23 AM  Result Value Ref Range   WBC 11.2 (H) 4.0 - 10.5 K/uL   RBC 3.37 (L) 3.87 - 5.11 MIL/uL   Hemoglobin 8.0 (L) 12.0 - 15.0 g/dL   HCT 36.624.7 (L) 44.036.0 - 34.746.0 %   MCV 73.3 (L) 80.0 - 100.0 fL   MCH 23.7 (L) 26.0 - 34.0 pg   MCHC 32.4 30.0 - 36.0 g/dL   RDW 42.513.9 95.611.5 - 38.715.5 %   Platelets 202 150 - 400 K/uL   nRBC 0.0 0.0 - 0.2 %    Comment: Performed at [facility], [address] [place], [place] [id number]     Pateint had an uncomplicated postpartum course.  She is ambulating, tolerating a regular diet, passing flatus, and urinating well.  Tasha Wu has a  history of anxiety and we discussed PMAD, precautions given for PPD. She is feeling sad about not being able to hold her baby. I called the NICU to ask if she could hold the baby for a minute with a bili blanket but the bilirubin is so high, the baby is on the verge of an exchange transfusion so they will not allow him out from under the lights at all. Also baby has a delicate UVC.  She will continue to take her home Fe (hgb 8) and does not need a refill.  Patient is discharged home in stable condition on 02/23/18.   Physical exam  Vitals:   02/22/18 0035 02/22/18 0615 02/22/18 1447 02/23/18 0500  BP: 110/79 103/79 110/70 120/78  Pulse:  62 65 (!) 55 62  Resp: 20 18 19 18   Temp:  97.7 F (36.5 C) 98 F (36.7 C) 97.8 F (36.6 C)  TempSrc:  Oral  Oral  SpO2:    100%  Weight:      Height:       General: alert, cooperative and no distress Lochia: appropriate Uterine Fundus: firm Incision: N/A DVT Evaluation: No evidence of DVT seen on physical exam. No significant calf/ankle edema. Labs: Lab Results  Component Value Date   WBC 11.2 (H) 02/22/2018   HGB 8.0 (L) 02/22/2018   HCT 24.7 (L) 02/22/2018   MCV 73.3 (L) 02/22/2018   PLT 202 02/22/2018   CMP Latest Ref Rng & Units 12/08/2017  Glucose 70 - 99 mg/dL 77  BUN 6 - 20 mg/dL 11  Creatinine 0.980.44 - 1.191.00 mg/dL 1.470.71  Sodium 829135 - 562145 mmol/L 135  Potassium 3.5 - 5.1 mmol/L 3.5  Chloride 98 - 111 mmol/L 107  CO2 22 - 32 mmol/L 21(L)  Calcium 8.9 - 10.3 mg/dL 8.3(L)  Total Protein 6.5 - 8.1 g/dL 6.7  Total Bilirubin 0.3 - 1.2 mg/dL 1.3(Y0.2(L)  Alkaline Phos 38 - 126 U/L 55  AST 15 - 41 U/L 14(L)  ALT 0 - 44 U/L 8    Discharge instruction: per After Visit Summary and "Baby and Me Booklet".  After visit meds:  Allergies as of 02/23/2018   No Known Allergies     Medication List    STOP taking these medications   HURRICAINE 20 % Gel Generic drug:  benzocaine   metroNIDAZOLE 500 MG tablet Commonly known as:  FLAGYL    TUMS 500 MG chewable tablet Generic drug:  calcium carbonate     TAKE these medications   ibuprofen 600 MG tablet Commonly known as:  ADVIL,MOTRIN Take 1 tablet (600 mg total) by mouth every 6 (six) hours. What changed:    medication strength  how much to take  when to take this  reasons to take this       Diet: routine diet  Activity: Advance as tolerated. Pelvic rest for 6 weeks.   Outpatient follow up:6 weeks Follow up Appt:No future appointments. Follow up Visit:No follow-ups on file.  Postpartum contraception: desires BTL in near future, discussed using barrier methods until then.  Newborn Data: Live born female  Birth Weight: 7 lb 9 oz (3430 g) APGAR: 8, 9  Newborn Delivery   Birth date/time:  02/21/2018 07:38:00 Delivery type:  Vaginal, Spontaneous     Baby Feeding: Breast Disposition:NICU, elevated bilirubin   02/23/2018 Jonetta Speakarol Krisha Beegle, CNM

## 2018-02-23 NOTE — Clinical Social Work Maternal (Signed)
CLINICAL SOCIAL WORK MATERNAL/CHILD NOTE  Patient Details  Name: Tasha Wu MRN: [id number] Date of Birth: Sep 03, 1990  Date:  02/23/2018  Clinical Social Worker Initiating Note:  Laurey Arrow Date/Time: Initiated:  02/22/18/1139     Child's Name:  Delena Serve   Biological Parents:  Mother, Father   Need for Interpreter:  None   Reason for Referral:  Behavioral Health Concerns, Other (Comment)(hx of anxiety and depression and Edinburgh score of 20.)   Address:  [place] [address]    Phone number:  [phone] (home)     Additional phone number:   Household Members/Support Persons (HM/SP):   Household Member/Support Person 1, Household Member/Support Person 2, Household Member/Support Person 3, Household Member/Support Person 4   HM/SP Name Relationship DOB or Age  HM/SP -1 Lemont Fillers FOB  08/08/1986  HM/SP -2 Sa'Mia Young daughter 06/18/12  HM/SP -3 Kha'Aziya Young daughter 12/28/2013  HM/SP -4        HM/SP -5        HM/SP -6        HM/SP -7        HM/SP -8          Natural Supports (not living in the home):  Parent, Friends   Chiropodist: None   Employment: Animator   Type of Work: Tour manager   Education:      Homebound arranged:    Museum/gallery curator Resources:  Multimedia programmer   Other Resources:      Cultural/Religious Considerations Which May Impact Care:  Per [employer] Presenter, broadcasting, MOB is [employer]  Strengths:  Home prepared for child , Ability to meet basic needs , Understanding of illness, Pediatrician chosen   Psychotropic Medications:         Pediatrician:    [facility] area  Pediatrician List:   [facility]  [place]      Pediatrician Fax Number:    Risk Factors/Current Problems:  Mental Health Concerns    Cognitive State:  Alert , Able to Concentrate , Linear Thinking , Insightful     Mood/Affect:  Tearful , Relaxed , Comfortable , Calm    CSW Assessment: CSW met with MOB in room 113 to complete an assessment for Edinburgh score of 20 and MH hx.  When CSW arrived, MOB was crying and shared with CSW that infant was just transferred to the NICU. MOB expressed feelings of sadness and being afraid.  CSW validated and normalized MOB's thoughts and feelings and discussed common emotions often experienced related to a NICU admission as well as during the first couple weeks of the postpartum period.   CSW provided education regarding the baby blues period vs. perinatal mood disorders, discussed treatment and gave resources for mental health follow up if concerns arise.  CSW recommends self-evaluation during the postpartum time period using the New Mom Checklist from Postpartum Progress and encouraged MOB to contact a medical professional if symptoms are noted at any time.  CSW assessed for safety and MOB denied SI, HI, and DV.  FOB entered the room and MOB gave CSW permission to continue the assessment while FOB was present.   NICU visitation was discussed and CSW inquired about barriers, concerns, and questions that MOB or FOB may have while infant is in NICU.  Both parents denied having questions, concerns, and barriers to visitation.    CSW thanked  MOB and FOB for meeting with CSW.  CSW will continue to follow-up with parents weekly while infant is in NICU.  CSW Plan/Description:  Psychosocial Support and Ongoing Assessment of Needs, Perinatal Mood and Anxiety Disorder (PMADs) Education, Other Information/Referral to [employer], Other Patient/Family Education   Laurey Arrow, MSW, [employer] Clinical Social Work [phone]   Dimple Nanas, LCSW 19-Oct-2017, 3:42 PM

## 2018-03-03 ENCOUNTER — Inpatient Hospital Stay (HOSPITAL_COMMUNITY): Admission: RE | Admit: 2018-03-03 | Payer: No Typology Code available for payment source | Source: Ambulatory Visit

## 2018-03-03 NOTE — Anesthesia Postprocedure Evaluation (Signed)
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  Last Pain: There were no vitals filed for this visit.               Francine Hannan S

## 2018-05-25 ENCOUNTER — Emergency Department (HOSPITAL_COMMUNITY)
Admission: EM | Admit: 2018-05-25 | Discharge: 2018-05-25 | Disposition: A | Discharge status: Home / Self Care | Payer: 59 | Attending: Emergency Medicine | Admitting: Emergency Medicine

## 2018-05-25 ENCOUNTER — Encounter (HOSPITAL_COMMUNITY): Payer: Self-pay | Admitting: Emergency Medicine

## 2018-05-25 ENCOUNTER — Other Ambulatory Visit: Payer: Self-pay

## 2018-05-25 DIAGNOSIS — J029 Acute pharyngitis, unspecified: Secondary | ICD-10-CM | POA: Diagnosis not present

## 2018-05-25 DIAGNOSIS — R059 Cough, unspecified: Secondary | ICD-10-CM

## 2018-05-25 DIAGNOSIS — Z87891 Personal history of nicotine dependence: Secondary | ICD-10-CM | POA: Insufficient documentation

## 2018-05-25 DIAGNOSIS — R05 Cough: Secondary | ICD-10-CM

## 2018-05-25 DIAGNOSIS — Z79899 Other long term (current) drug therapy: Secondary | ICD-10-CM | POA: Insufficient documentation

## 2018-05-25 MED ORDER — LEVOCETIRIZINE DIHYDROCHLORIDE 5 MG PO TABS: 5.0000 mg | ORAL_TABLET | Freq: Every evening | ORAL | 0 refills | Status: DC

## 2018-05-25 NOTE — ED Triage Notes (Signed)
Patient c/o productive cough with sore throat x5 days. Denies N/V/D.

## 2018-05-25 NOTE — ED Provider Notes (Signed)
[facility]-EMERGENCY DEPT Provider Note   CSN: [id number] Arrival date & time: 05/25/18  1251    History   Chief Complaint Chief Complaint  Patient presents with  . Cough  . Sore Throat    HPI    Tasha Wu is a 28 y.o. female with a PMHx of kidney stones, anxiety, anemia, GERD, migraines, sickle cell trait, PCOS, and other conditions listed below, who presents to the ED with complaints of sore throat for the last 4 days with an associated cough with occasional yellow sputum production that feels like is coming from the back of her throat.  The symptoms have been going on for 4 days.  Symptoms have been mild and constant.  She has tried DayQuil and over-the-counter generic allergy medicine without relief.  Symptoms seem to be worse in the morning.  She has had some hoarseness in her voice as well.  She has noticed some rib cage soreness when she coughs.  She denies any recent sick contacts or exposure to COVID 19 that she is aware of.  She denies any recent travel.  She also denies any fevers, chills, body aches, rhinorrhea, drooling, trismus, ear pain or drainage, CP, SOB, abd pain, N/V/D/C, hematuria, dysuria, myalgias, arthralgias, numbness, tingling, focal weakness, or any other complaints at this time.   The history is provided by the patient and medical records. No language interpreter was used.  Cough  Associated symptoms: sore throat   Associated symptoms: no chest pain, no chills, no ear pain, no fever, no myalgias, no rhinorrhea and no shortness of breath   Sore Throat  Pertinent negatives include no chest pain, no abdominal pain and no shortness of breath.    Past Medical History:  Diagnosis Date  . Abnormal Pap smear 12/10/2011  . Anemia   . Anxiety 2010   anxiety attack;no meds;has not been dx'd  . Emotional instability (HCC)    Break down easily when things are not going her way  . GERD (gastroesophageal reflux disease)   . Hx of joint  problems    Shoulders and knees have pain  . Infection    Yeast;would get frequently  . Infection    BV x 1  . Kidney infection 2010   PO antibxs  . Kidney stone complicating pregnancy   . Migraines    no rx'd meds in the past;goes to sleep  . PCOS (polycystic ovarian syndrome)   . Sickle cell trait (HCC)   . Umbilical hernia 1991   Does not cause anyp problems  . Vaginal Pap smear, abnormal     Patient Active Problem List   Diagnosis Date Noted  . SVD (spontaneous vaginal delivery) 02/21/2018  . Anemia 12/21/2017  . Pregnant 07/24/2017  . Sickle cell trait (HCC) 07/21/2017  . Anxiety 10/03/2016  . Gastroesophageal reflux disease 10/03/2016  . Normal labor 12/28/2013  . Post term pregnancy, antepartum condition or complication 06/18/2012  . Abnormal Pap smear 12/10/2011  . Umbilical hernia 11/29/2011  . H/O pyelonephritis 11/29/2011    Past Surgical History:  Procedure Laterality Date  . DILATION AND CURETTAGE OF UTERUS  2009   No complications  . WISDOM TOOTH EXTRACTION       OB History    Gravida  4   Para  3   Term  3   Preterm      AB  1   Living  3     SAB      TAB  Ectopic      Multiple  0   Live Births  3            Home Medications    Prior to Admission medications   Medication Sig Start Date End Date Taking? Authorizing Provider  ibuprofen (ADVIL,MOTRIN) 600 MG tablet Take 1 tablet (600 mg total) by mouth every 6 (six) hours. 02/23/18   Hartsog, Marry Guan, CNM    Family History Family History  Problem Relation Age of Onset  . Sickle cell trait Mother   . Hypertension Mother   . Anemia Mother   . Sickle cell trait Brother   . Sickle cell anemia Maternal Aunt        Recently deceased from [facility] crisis  . Diabetes Maternal Aunt   . Sickle cell anemia Maternal Grandmother        Deceased  . Heart disease Maternal Aunt   . Asthma Sister        1/2 sibling;father's child  . Hyperthyroidism Paternal Grandmother   .  Scoliosis Paternal Grandmother   . Arthritis Paternal Grandmother   . Migraines Father   . Other Father        Joint Problems;shoulders and knees  . Hernia Maternal Aunt        Umbilical  . Hypertension Maternal Uncle   . Diabetes type I Cousin        maternal  . Diabetes Paternal Grandfather     Social History Social History   Tobacco Use  . Smoking status: Former Smoker    Packs/day: 0.25    Types: Cigarettes    Last attempt to quit: 11/18/2011    Years since quitting: 6.5  . Smokeless tobacco: Never Used  Substance Use Topics  . Alcohol use: Not Currently    Comment: occ  . Drug use: No     Allergies   Patient has no known allergies.   Review of Systems Review of Systems  Constitutional: Negative for chills and fever.  HENT: Positive for sore throat. Negative for drooling, ear discharge, ear pain, rhinorrhea and trouble swallowing.   Respiratory: Positive for cough. Negative for shortness of breath.   Cardiovascular: Negative for chest pain.  Gastrointestinal: Negative for abdominal pain, constipation, diarrhea, nausea and vomiting.  Genitourinary: Negative for dysuria and hematuria.  Musculoskeletal: Negative for arthralgias and myalgias.       +rib cage soreness from coughing  Skin: Negative for color change.  Allergic/Immunologic: Negative for immunocompromised state.  Neurological: Negative for weakness and numbness.  Psychiatric/Behavioral: Negative for confusion.   All other systems reviewed and are negative for acute change except as noted in the HPI.    Physical Exam Updated Vital Signs BP (!) 128/99 (BP Location: Right Arm)   Pulse 87   Temp 98.3 F (36.8 C) (Oral)   Resp 18   LMP 05/11/2018   SpO2 100%   Physical Exam Vitals signs and nursing note reviewed.  Constitutional:      General: She is not in acute distress.    Appearance: Normal appearance. She is well-developed. She is not toxic-appearing.     Comments: Afebrile, nontoxic, NAD   HENT:     Head: Normocephalic and atraumatic.     Nose: Congestion and rhinorrhea present.     Comments: Nose congested with mild rhinorrhea    Mouth/Throat:     Mouth: Mucous membranes are moist.     Pharynx: Uvula midline. Posterior oropharyngeal erythema present. No pharyngeal swelling, oropharyngeal exudate or uvula  swelling.     Tonsils: No tonsillar exudate or tonsillar abscesses.     Comments: Cobblestoning noted in posterior oropharynx. Postnasal drainage noted. Oropharynx very mildly injected, without uvular swelling or deviation, no trismus or drooling, no tonsillar swelling or exudates.  No PTA Eyes:     General:        Right eye: No discharge.        Left eye: No discharge.     Conjunctiva/sclera: Conjunctivae normal.  Neck:     Musculoskeletal: Normal range of motion and neck supple.  Cardiovascular:     Rate and Rhythm: Normal rate and regular rhythm.     Pulses: Normal pulses.     Heart sounds: Normal heart sounds, S1 normal and S2 normal. No murmur. No friction rub. No gallop.   Pulmonary:     Effort: Pulmonary effort is normal. No respiratory distress.     Breath sounds: Normal breath sounds. No decreased breath sounds, wheezing, rhonchi or rales.     Comments: CTAB in all lung fields, no w/r/r, no hypoxia or increased WOB, speaking in full sentences, SpO2 100% on RA  Chest:     Chest wall: Tenderness present. No deformity or crepitus.     Comments: Mild tenderness to b/l rib costal margins and anterior chest, no crepitus or deformity Abdominal:     General: Bowel sounds are normal. There is no distension.     Palpations: Abdomen is soft. Abdomen is not rigid.     Tenderness: There is no abdominal tenderness. There is no right CVA tenderness, left CVA tenderness, guarding or rebound. Negative signs include Murphy's sign and McBurney's sign.  Musculoskeletal: Normal range of motion.  Skin:    General: Skin is warm and dry.     Findings: No rash.  Neurological:      Mental Status: She is alert and oriented to person, place, and time.     Sensory: Sensation is intact. No sensory deficit.     Motor: Motor function is intact.  Psychiatric:        Mood and Affect: Mood and affect normal.        Behavior: Behavior normal.      ED Treatments / Results  Labs (all labs ordered are listed, but only abnormal results are displayed) Labs Reviewed - No data to display  EKG None  Radiology No results found.  Procedures Procedures (including critical care time)  Medications Ordered in ED Medications - No data to display   Initial Impression / Assessment and Plan / ED Course  I have reviewed the triage vital signs and the nursing notes.  Pertinent labs & imaging results that were available during my care of the patient were reviewed by me and considered in my medical decision making (see chart for details).        27 y.o. female here with sore throat and cough x4 days. Also with rib cage soreness from coughing. On exam, clear lung exam, afebrile and nontoxic, well appearing, nose congested with some rhinorrhea, postnasal drainage and cobblestoning noted, throat injected but no tonsillar swelling or exudates, mild chest wall soreness to palpation. Likely allergies, vs viral etiology. Doubt need for COVID19 testing. Advised self quarantining guidelines to be cautious, but will start on xyzal. Advised other OTC remedies for symptomatic relief, and f/up with PCP in 1wk for recheck. Doubt need for imaging/labs at this time. I explained the diagnosis and have given explicit precautions to return to the ER including for any  other new or worsening symptoms. The patient understands and accepts the medical plan as it's been dictated and I have answered their questions. Discharge instructions concerning home care and prescriptions have been given. The patient is STABLE and is discharged to home in good condition.    Final Clinical Impressions(s) / ED Diagnoses    Final diagnoses:  Sore throat  Cough    ED Discharge Orders         Ordered    levocetirizine (XYZAL) 5 MG tablet  Every evening     05/25/18 [address]1311           Carsynn Bethune, BonitaMercedes, [place] JerseyPA-C 05/25/18 1320    Bethann BerkshireZammit, Joseph, MD 05/25/18 1446

## 2018-05-25 NOTE — Discharge Instructions (Signed)
You likely have allergies causing your symptoms, however it's impossible to know for certain, it could also be a viral illness. You should quarantine yourself until your symptoms resolve or at least a week, whichever is longer. Start taking xyzal as directed. Continue to stay well-hydrated. Gargle warm salt water and spit it out and use chloraseptic spray as needed for sore throat. Continue to alternate between Tylenol and Ibuprofen for pain or fever. Use Mucinex/Robitussin/etc for cough suppression/expectoration of mucus. Use over the counter flonase and the netipot to help with nasal congestion. Follow up with your primary care doctor in 5-7 days for recheck of ongoing symptoms. Return to emergency department for emergent changing or worsening of symptoms.

## 2018-09-24 ENCOUNTER — Ambulatory Visit (INDEPENDENT_AMBULATORY_CARE_PROVIDER_SITE_OTHER): Payer: 59 | Admitting: Family Medicine

## 2018-09-24 ENCOUNTER — Other Ambulatory Visit: Payer: Self-pay

## 2018-09-24 VITALS — BP 109/70 | HR 80 | Temp 98.2°F | Ht 66.0 in | Wt 141.2 lb

## 2018-09-24 DIAGNOSIS — R300 Dysuria: Secondary | ICD-10-CM

## 2018-09-24 DIAGNOSIS — R319 Hematuria, unspecified: Secondary | ICD-10-CM

## 2018-09-24 LAB — POCT URINALYSIS DIP (MANUAL ENTRY)
Bilirubin, UA: NEGATIVE
Glucose, UA: NEGATIVE mg/dL
Ketones, POC UA: NEGATIVE mg/dL
Nitrite, UA: NEGATIVE
Protein Ur, POC: NEGATIVE mg/dL
Spec Grav, UA: 1.02 (ref 1.010–1.025)
Urobilinogen, UA: 0.2 E.U./dL
pH, UA: 6.5 (ref 5.0–8.0)

## 2018-09-24 MED ORDER — AMOXICILLIN 250 MG PO CAPS: 250.0000 mg | ORAL_CAPSULE | Freq: Two times a day (BID) | ORAL | 0 refills | Status: DC

## 2018-09-24 NOTE — Progress Notes (Signed)
Acute Office Visit  Subjective:    Patient ID: Tasha Wu, female    DOB: 14-Jul-1990, 28 y.o.   MRN: [id number]  Chief Complaint  Patient presents with  . Urinary Frequency    painful urination with odor x's 4 days   Painful urination, pressure in bladder and pain in back and lower abdomin times 4 days now, urine has a odor to it   HPI Patient is in today for painful urination with back and abdominal pain. Pt drinks Mt Dew and little water. No fever.   Past Medical History:  Diagnosis Date  . Abnormal Pap smear 12/10/2011  . Anemia   . Anxiety 2010   anxiety attack;no meds;has not been dx'd  . Emotional instability (HCC)    Break down easily when things are not going her way  . GERD (gastroesophageal reflux disease)   . Hx of joint problems    Shoulders and knees have pain  . Infection    Yeast;would get frequently  . Infection    BV x 1  . Kidney infection 2010   PO antibxs  . Kidney stone complicating pregnancy   . Migraines    no rx'd meds in the past;goes to sleep  . PCOS (polycystic ovarian syndrome)   . Sickle cell trait (HCC)   . Umbilical hernia 1991   Does not cause anyp problems  . Vaginal Pap smear, abnormal     Past Surgical History:  Procedure Laterality Date  . DILATION AND CURETTAGE OF UTERUS  2009   No complications  . WISDOM TOOTH EXTRACTION      Family History  Problem Relation Age of Onset  . Sickle cell trait Mother   . Hypertension Mother   . Anemia Mother   . Sickle cell trait Brother   . Sickle cell anemia Maternal Aunt        [facility]  . Diabetes Maternal Aunt   . Sickle cell anemia Maternal Grandmother        Deceased  . Heart disease Maternal Aunt   . Asthma Sister        1/2 sibling;father's child  . Hyperthyroidism Paternal Grandmother   . Scoliosis Paternal Grandmother   . Arthritis Paternal Grandmother   . Migraines Father   . Other Father        Joint Problems;shoulders and knees  .  Hernia Maternal Aunt        Umbilical  . Hypertension Maternal Uncle   . Diabetes type I Cousin        maternal  . Diabetes Paternal Grandfather     Social History   Socioeconomic History  . Marital status: Single    Spouse name: Not on file  . Number of children: 2  . Years of education: 3015  . Highest education level: Not on file  Occupational History  . Not on file  Social Needs  . Financial resource strain: Not on file  . Food insecurity    Worry: Not on file    Inability: Not on file  . Transportation needs    Medical: Not on file    Non-medical: Not on file  Tobacco Use  . Smoking status: Former Smoker    Packs/day: 0.25    Types: Cigarettes    Quit date: 11/18/2011    Years since quitting: 6.8  . Smokeless tobacco: Never Used  Substance and Sexual Activity  . Alcohol use: Not Currently    Comment: occ  .  Drug use: No  . Sexual activity: Yes    Partners: Male    Birth control/protection: None  Lifestyle  . Physical activity    Days per week: Not on file    Minutes per session: Not on file  . Stress: Not on file  Relationships  . Social Herbalist on phone: Not on file    Gets together: Not on file    Attends religious service: Not on file    Active member of club or organization: Not on file    Attends meetings of clubs or organizations: Not on file    Relationship status: Not on file  . Intimate partner violence    Fear of current or ex partner: Not on file    Emotionally abused: Not on file    Physically abused: Not on file    Forced sexual activity: Not on file  Other Topics Concern  . Not on file  Social History Narrative  . Not on file    Outpatient Medications Prior to Visit  Medication Sig Dispense Refill  . Norgestimate-Eth Estradiol (SPRINTEC 28 PO) Take 28 mg by mouth.    Marland Kitchen ibuprofen (ADVIL,MOTRIN) 600 MG tablet Take 1 tablet (600 mg total) by mouth every 6 (six) hours. 30 tablet 0  . levocetirizine (XYZAL) 5 MG tablet Take 1  tablet (5 mg total) by mouth every evening. 30 tablet 0   No facility-administered medications prior to visit.     No Known Allergies  Review of Systems  Constitutional: Negative for fever.  Genitourinary: Positive for dysuria and urgency. Negative for frequency.       Objective:    Physical Exam  Constitutional: She is oriented to person, place, and time. She appears well-developed and well-nourished. No distress.  Abdominal: Soft. Bowel sounds are normal. There is abdominal tenderness.  Musculoskeletal:        General: No tenderness.  Neurological: She is alert and oriented to person, place, and time.    BP 109/70 (BP Location: Left Arm, Patient Position: Sitting, Cuff Size: Normal)   Pulse 80   Temp 98.2 F (36.8 C) (Oral)   Ht 5\' 6"  (1.676 m)   Wt 141 lb 3.2 oz (64 kg)   SpO2 99%   BMI 22.79 kg/m  Wt Readings from Last 3 Encounters:  09/24/18 141 lb 3.2 oz (64 kg)  02/21/18 164 lb (74.4 kg)  09/18/17 129 lb 12.8 oz (58.9 kg)    Health Maintenance Due  Topic Date Due  . PAP-Cervical Cytology Screening  11/29/2014  . PAP SMEAR-Modifier  11/29/2014     No results found for: TSH Lab Results  Component Value Date   WBC 11.2 (H) 02/22/2018   HGB 8.0 (L) 02/22/2018   HCT 24.7 (L) 02/22/2018   MCV 73.3 (L) 02/22/2018   PLT 202 02/22/2018   Lab Results  Component Value Date   NA 135 12/08/2017   K 3.5 12/08/2017   CO2 21 (L) 12/08/2017   GLUCOSE 77 12/08/2017   BUN 11 12/08/2017   CREATININE 0.71 12/08/2017   BILITOT 0.2 (L) 12/08/2017   ALKPHOS 55 12/08/2017   AST 14 (L) 12/08/2017   ALT 8 12/08/2017   PROT 6.7 12/08/2017   ALBUMIN 3.1 (L) 12/08/2017   CALCIUM 8.3 (L) 12/08/2017   ANIONGAP 7 12/08/2017      Assessment & Plan:       Visit Diagnoses    Dysuria    -  Primary  Relevant Orders   POCT urinalysis dipstick (Completed)   Urine Culture   Hematuria, unspecified type       Relevant Orders   Urine Culture      Meds ordered this  encounter  Medications  . amoxicillin (AMOXIL) 250 MG capsule    Sig: Take 1 capsule (250 mg total) by mouth 2 (two) times daily.    Dispense:  10 capsule    Refill:  0     LISA Mat CarneLEIGH CORUM, MD

## 2018-09-26 LAB — URINE CULTURE

## 2018-12-09 ENCOUNTER — Other Ambulatory Visit: Payer: Self-pay

## 2018-12-09 ENCOUNTER — Telehealth (INDEPENDENT_AMBULATORY_CARE_PROVIDER_SITE_OTHER): Payer: 59 | Admitting: Registered Nurse

## 2018-12-09 DIAGNOSIS — R490 Dysphonia: Secondary | ICD-10-CM

## 2018-12-09 MED ORDER — CETIRIZINE HCL 10 MG PO TABS: 10.0000 mg | ORAL_TABLET | Freq: Every day | ORAL | 11 refills | Status: DC

## 2018-12-09 MED ORDER — ALBUTEROL SULFATE HFA 108 (90 BASE) MCG/ACT IN AERS: 2.0000 | INHALATION_SPRAY | Freq: Four times a day (QID) | RESPIRATORY_TRACT | 1 refills | Status: DC | PRN

## 2018-12-09 MED ORDER — PROMETHAZINE-CODEINE 6.25-10 MG/5ML PO SOLN: 5.0000 mL | Freq: Every evening | ORAL | 0 refills | Status: DC

## 2018-12-09 NOTE — Progress Notes (Signed)
Spoke with pt and she states she has been horse x 2 days with some dry cough. She states there has not been any fever,shob, or nasal drainage associated with the cough. She states there has been some chest pain but not much. She states her throat is really dry and her lips.

## 2018-12-09 NOTE — Progress Notes (Signed)
Telemedicine Encounter- SOAP NOTE Established Patient  This telephone encounter was conducted with the patient's (or proxy's) verbal consent via audio telecommunications: yes  Patient was instructed to have this encounter in a suitably private space; and to only have persons present to whom they give permission to participate. In addition, patient identity was confirmed by use of name plus two identifiers (DOB and address).  I discussed the limitations, risks, security and privacy concerns of performing an evaluation and management service by telephone and the availability of in person appointments. I also discussed with the patient that there may be a patient responsible charge related to this service. The patient expressed understanding and agreed to proceed.  I spent a total of 13 minutes talking with the patient or their proxy.  No chief complaint on file.   Subjective   Tasha Wu is a 28 y.o. established patient. Telephone visit today for hoarseness of voice  HPI Onset 2 days prior to visit. Symptoms have gotten worse. Some dry cough, some soreness in ribs.  Denies fever, headaches, fatigue, NVD, change in taste or smell, nasal or sinus congestion, productive cough, shob, chest pain. History of GERD - but denies these symptoms now. They only occurred during pregnancy. She has no known sick contacts. These symptoms have happened for her before, usually 1-2 times per year. They are transient.   Patient Active Problem List   Diagnosis Date Noted  . SVD (spontaneous vaginal delivery) 02/21/2018  . Anemia 12/21/2017  . Pregnant 07/24/2017  . Sickle cell trait ([place]) 07/21/2017  . Anxiety 10/03/2016  . Gastroesophageal reflux disease 10/03/2016  . Normal labor 12/28/2013  . Post term pregnancy, antepartum condition or complication 08/29/9483  . Abnormal Pap smear 12/10/2011  . Umbilical hernia 46/27/0350  . H/O pyelonephritis 11/29/2011    Past Medical History:   Diagnosis Date  . Abnormal Pap smear 12/10/2011  . Anemia   . Anxiety 2010   anxiety attack;no meds;has not been dx'd  . Emotional instability ([place])    Break down easily when things are not going her way  . GERD (gastroesophageal reflux disease)   . Hx of joint problems    Shoulders and knees have pain  . Infection    Yeast;would get frequently  . Infection    BV x 1  . Kidney infection 2010   PO antibxs  . Kidney stone complicating pregnancy   . Migraines    no rx'd meds in the past;goes to sleep  . PCOS (polycystic ovarian syndrome)   . Sickle cell trait ([place])   . Umbilical hernia 0938   Does not cause anyp problems  . Vaginal Pap smear, abnormal     Current Outpatient Medications  Medication Sig Dispense Refill  . ibuprofen (ADVIL,MOTRIN) 600 MG tablet Take 1 tablet (600 mg total) by mouth every 6 (six) hours. 30 tablet 0  . Norgestimate-Eth Estradiol (SPRINTEC 28 PO) Take 28 mg by mouth.    Marland Kitchen albuterol (VENTOLIN HFA) 108 (90 Base) MCG/ACT inhaler Inhale 2 puffs into the lungs every 6 (six) hours as needed for wheezing or shortness of breath. 8 g 1  . cetirizine (ZYRTEC) 10 MG tablet Take 1 tablet (10 mg total) by mouth daily. 30 tablet 11  . Promethazine-Codeine 6.25-10 MG/5ML SOLN Take 5 mLs by mouth every evening. 118 mL 0   No current facility-administered medications for this visit.     No Known Allergies  Social History   Socioeconomic History  . Marital status:  Single    Spouse name: Not on file  . Number of children: 2  . Years of education: 29  . Highest education level: Not on file  Occupational History  . Not on file  Social Needs  . Financial resource strain: Not on file  . Food insecurity    Worry: Not on file    Inability: Not on file  . Transportation needs    Medical: Not on file    Non-medical: Not on file  Tobacco Use  . Smoking status: Former Smoker    Packs/day: 0.25    Types: Cigarettes    Quit date: 11/18/2011    Years since  quitting: 7.0  . Smokeless tobacco: Never Used  Substance and Sexual Activity  . Alcohol use: Not Currently    Comment: occ  . Drug use: No  . Sexual activity: Yes    Partners: Male    Birth control/protection: None  Lifestyle  . Physical activity    Days per week: Not on file    Minutes per session: Not on file  . Stress: Not on file  Relationships  . Social Musician on phone: Not on file    Gets together: Not on file    Attends religious service: Not on file    Active member of club or organization: Not on file    Attends meetings of clubs or organizations: Not on file    Relationship status: Not on file  . Intimate partner violence    Fear of current or ex partner: Not on file    Emotionally abused: Not on file    Physically abused: Not on file    Forced sexual activity: Not on file  Other Topics Concern  . Not on file  Social History Narrative  . Not on file    Review of Systems  Constitutional: Negative.   HENT: Positive for sore throat.   Eyes: Negative.   Respiratory: Negative.   Cardiovascular: Negative.   Gastrointestinal: Negative.   Genitourinary: Negative.   Musculoskeletal: Negative.   Skin: Negative.   Neurological: Negative.   Endo/Heme/Allergies: Negative.   Psychiatric/Behavioral: Negative.   All other systems reviewed and are negative.   Objective   Vitals as reported by the patient: There were no vitals filed for this visit.  Diagnoses and all orders for this visit:  Hoarseness of voice -     cetirizine (ZYRTEC) 10 MG tablet; Take 1 tablet (10 mg total) by mouth daily. -     albuterol (VENTOLIN HFA) 108 (90 Base) MCG/ACT inhaler; Inhale 2 puffs into the lungs every 6 (six) hours as needed for wheezing or shortness of breath. -     Promethazine-Codeine 6.25-10 MG/5ML SOLN; Take 5 mLs by mouth every evening.   PLAN  Suspect allergies vs viral URI at this time. Will send albuterol for feeling of dyspnea, cetirizine for  allergies, promethazine-codeine syrup for nighttime cough relief.  Return to clinic in one week if symptoms worsen or fail to improve  Patient encouraged to call clinic with any questions, comments, or concerns.   I discussed the assessment and treatment plan with the patient. The patient was provided an opportunity to ask questions and all were answered. The patient agreed with the plan and demonstrated an understanding of the instructions.   The patient was advised to call back or seek an in-person evaluation if the symptoms worsen or if the condition fails to improve as anticipated.  I provided 13  minutes of non-face-to-face time during this encounter.  Janeece Ageeichard Jamarques Pinedo, NP  [facility]

## 2018-12-24 ENCOUNTER — Telehealth: Payer: 59 | Admitting: Family

## 2018-12-24 DIAGNOSIS — Z20828 Contact with and (suspected) exposure to other viral communicable diseases: Secondary | ICD-10-CM

## 2018-12-24 DIAGNOSIS — Z20822 Contact with and (suspected) exposure to covid-19: Secondary | ICD-10-CM

## 2018-12-24 MED ORDER — BENZONATATE 100 MG PO CAPS: 100.0000 mg | ORAL_CAPSULE | Freq: Three times a day (TID) | ORAL | 0 refills | Status: DC | PRN

## 2018-12-24 MED ORDER — ALBUTEROL SULFATE HFA 108 (90 BASE) MCG/ACT IN AERS: 2.0000 | INHALATION_SPRAY | Freq: Four times a day (QID) | RESPIRATORY_TRACT | 0 refills | Status: DC | PRN

## 2018-12-24 NOTE — Progress Notes (Signed)
E-Visit for Corona Virus Screening   Your current symptoms could be consistent with the coronavirus.  Many health care providers can now test patients at their office but not all are.  [facility] has multiple testing sites. For information on our COVID testing locations and hours go to [email or web address]  Please quarantine yourself while awaiting your test results.  We are enrolling you in our MyChart Home Montioring for COVID19 . Daily you will receive a questionnaire within the MyChart website. Our COVID 19 response team willl be monitoriing your responses daily.  You can go to one of the  testing sites listed below, while they are opened (see hours). You do not need an order and will stay in your car during the test. You do need to self isolate until your results return and if positive 14 days from when your symptoms started and until you are 3 days symptom free.   Testing Locations (Monday - Friday, 8 a.m. - 3:30 p.m.) . [place]: [facility], [address], [place], [place]  . [place]: [facility], [address], [place], [place] (entrance [address])  . [place]: (Closed each Monday): Testing site relocated to the short stay covered drive at [facility]. (Use the [address] entrance to [facility] next to [facility].)   COVID-19 is a respiratory illness with symptoms that are similar to the flu. Symptoms are typically mild to moderate, but there have been cases of severe illness and death due to the virus. The following symptoms may appear 2-14 days after exposure: . Fever . Cough . Shortness of breath or difficulty breathing . Chills . Repeated shaking with chills . Muscle pain . Headache . Sore throat . New loss of taste or smell . Fatigue . Congestion or runny nose . Nausea or vomiting . Diarrhea  It is vitally important that if you feel that you  have an infection such as this virus or any other virus that you stay home and away from places where you may spread it to others.  You should self-quarantine for 14 days if you have symptoms that could potentially be coronavirus or have been in close contact a with a person diagnosed with COVID-19 within the last 2 weeks. You should avoid contact with people age 65 and older.   You should wear a mask or cloth face covering over your nose and mouth if you must be around other people or animals, including pets (even at home). Try to stay at least 6 feet away from other people. This will protect the people around you.  You can use medication such as A prescription cough medication called Tessalon Perles 100 mg. You may take 1-2 capsules every 8 hours as needed for cough and A prescription inhaler called Albuterol MDI 90 mcg /actuation 2 puffs every 4 hours as needed for shortness of breath, wheezing, cough  You may also take acetaminophen (Tylenol) as needed for fever.   Reduce your risk of any infection by using the same precautions used for avoiding the common cold or flu:  . Wash your hands often with soap and warm water for at least 20 seconds.  If soap and water are not readily available, use an alcohol-based hand sanitizer with at least 60% alcohol.  . If coughing or sneezing, cover your mouth and nose by coughing or sneezing into the elbow areas of your shirt or coat, into a tissue or   into your sleeve (not your hands). . Avoid shaking hands with others and consider head nods or verbal greetings only. . Avoid touching your eyes, nose, or mouth with unwashed hands.  . Avoid close contact with people who are sick. . Avoid places or events with large numbers of people in one location, like concerts or sporting events. . Carefully consider travel plans you have or are making. . If you are planning any travel outside or inside the [place], visit the CDC's Travelers' Health webpage for the latest health  notices. . If you have some symptoms but not all symptoms, continue to monitor at home and seek medical attention if your symptoms worsen. . If you are having a medical emergency, call 911.  HOME CARE . Only take medications as instructed by your medical team. . Drink plenty of fluids and get plenty of rest. . A steam or ultrasonic humidifier can help if you have congestion.   GET HELP RIGHT AWAY IF YOU HAVE EMERGENCY WARNING SIGNS** FOR COVID-19. If you or someone is showing any of these signs seek emergency medical care immediately. Call 911 or proceed to your closest emergency facility if: . You develop worsening high fever. . Trouble breathing . Bluish lips or face . Persistent pain or pressure in the chest . New confusion . Inability to wake or stay awake . You cough up blood. . Your symptoms become more severe  **This list is not all possible symptoms. Contact your medical provider for any symptoms that are sever or concerning to you.   MAKE SURE YOU   Understand these instructions.  Will watch your condition.  Will get help right away if you are not doing well or get worse.  Your e-visit answers were reviewed by a board certified advanced clinical practitioner to complete your personal care plan.  Depending on the condition, your plan could have included both over the counter or prescription medications.  If there is a problem please reply once you have received a response from your provider.  Your safety is important to us.  If you have drug allergies check your prescription carefully.    You can use MyChart to ask questions about today's visit, request a non-urgent call back, or ask for a work or school excuse for 24 hours related to this e-Visit. If it has been greater than 24 hours you will need to follow up with your provider, or enter a new e-Visit to address those concerns. You will get an e-mail in the next two days asking about your experience.  I hope that your  e-visit has been valuable and will speed your recovery. Thank you for  using e-visits.   Approximately 5 minutes was spent documenting and reviewing patient's chart.   

## 2018-12-25 ENCOUNTER — Other Ambulatory Visit: Payer: Self-pay

## 2018-12-25 DIAGNOSIS — Z20822 Contact with and (suspected) exposure to covid-19: Secondary | ICD-10-CM

## 2018-12-27 LAB — NOVEL CORONAVIRUS, NAA: SARS-CoV-2, NAA: NOT DETECTED

## 2018-12-30 ENCOUNTER — Encounter (INDEPENDENT_AMBULATORY_CARE_PROVIDER_SITE_OTHER): Payer: Self-pay

## 2018-12-31 ENCOUNTER — Encounter (INDEPENDENT_AMBULATORY_CARE_PROVIDER_SITE_OTHER): Payer: Self-pay

## 2018-12-31 ENCOUNTER — Telehealth: Payer: Self-pay

## 2018-12-31 NOTE — Telephone Encounter (Signed)
Patient advise on diarrhea per protocol:   If diarrhea remains the same: encourage patient to drink oral fluids and bland foods.   Avoid alcohol, spicy foods, caffeine or fatty foods that could make diarrhea worse.   Continue to monitor for signs of dehydration (increased thirst decreased urine output, yellow urine, dry skin, headache or dizziness).   Advise patient to try OTC medication (Imodium, kaopectate, Pepto-Bismol) as per manufacturer's instructions.   If worsening diarrhea occurs and becomes severe (6-7 bowel movements a day): notify PCP   If diarrhea last greater than 7 days: notify PCP   IF SIGNS OF DEHYDRATION OCCUR (INCREASED THIRST, DECREASED URINE OUTPUT, YELLOW URINE, DRY SKIN, HEADACHE OR DIZZINESS) ADVISE PATIENT TO CALL 911 AND SEEK TREATMENT IN THE ED  Patient verbalized understanding and agrees with plan.

## 2019-01-01 ENCOUNTER — Encounter (INDEPENDENT_AMBULATORY_CARE_PROVIDER_SITE_OTHER): Payer: Self-pay

## 2019-01-03 ENCOUNTER — Encounter (INDEPENDENT_AMBULATORY_CARE_PROVIDER_SITE_OTHER): Payer: Self-pay

## 2019-01-05 ENCOUNTER — Encounter (INDEPENDENT_AMBULATORY_CARE_PROVIDER_SITE_OTHER): Payer: Self-pay

## 2019-01-31 ENCOUNTER — Telehealth: Payer: 59 | Admitting: Physician Assistant

## 2019-01-31 DIAGNOSIS — J029 Acute pharyngitis, unspecified: Secondary | ICD-10-CM

## 2019-01-31 NOTE — Progress Notes (Signed)
We are sorry that you are not feeling well.  Here is how we plan to help!  Your symptoms indicate a likely viral infection (Pharyngitis).   Pharyngitis is inflammation in the back of the throat which can cause a sore throat, scratchiness and sometimes difficulty swallowing.   Pharyngitis is typically caused by a respiratory virus and will just run its course.  Please keep in mind that your symptoms could last up to 10 days.  For throat pain, we recommend over the counter oral pain relief medications such as acetaminophen or aspirin, or anti-inflammatory medications such as ibuprofen or naproxen sodium.  Topical treatments such as oral throat lozenges or sprays may be used as needed.  Avoid close contact with loved ones, especially the very young and elderly.  Remember to wash your hands thoroughly throughout the day as this is the number one way to prevent the spread of infection and wipe down door knobs and counters with disinfectant.  After careful review of your answers, I would not recommend and antibiotic for your condition.  Antibiotics should not be used to treat conditions that we suspect are caused by viruses like the virus that causes the common cold or flu. However, some people can have Strep with atypical symptoms. You may need formal testing in clinic or office to confirm if your symptoms continue or worsen.  Providers prescribe antibiotics to treat infections caused by bacteria. Antibiotics are very powerful in treating bacterial infections when they are used properly.  To maintain their effectiveness, they should be used only when necessary.  Overuse of antibiotics has resulted in the development of super bugs that are resistant to treatment!    Home Care:  Only take medications as instructed by your medical team.  Do not drink alcohol while taking these medications.  A steam or ultrasonic humidifier can help congestion.  You can place a towel over your head and breathe in the steam from  hot water coming from a faucet.  Avoid close contacts especially the very young and the elderly.  Cover your mouth when you cough or sneeze.  Always remember to wash your hands.  Get Help Right Away If:  You develop worsening fever or throat pain.  You develop a severe head ache or visual changes.  Your symptoms persist after you have completed your treatment plan.  Make sure you  Understand these instructions.  Will watch your condition.  Will get help right away if you are not doing well or get worse.  Your e-visit answers were reviewed by a board certified advanced clinical practitioner to complete your personal care plan.  Depending on the condition, your plan could have included both over the counter or prescription medications.  If there is a problem please reply  once you have received a response from your provider.  Your safety is important to us.  If you have drug allergies check your prescription carefully.    You can use MyChart to ask questions about todays visit, request a non-urgent call back, or ask for a work or school excuse for 24 hours related to this e-Visit. If it has been greater than 24 hours you will need to follow up with your provider, or enter a new e-Visit to address those concerns.  You will get an e-mail in the next two days asking about your experience.  I hope that your e-visit has been valuable and will speed your recovery. Thank you for using e-visits.    

## 2019-01-31 NOTE — Progress Notes (Signed)
I have spent 5 minutes in review of e-visit questionnaire, review and updating patient chart, medical decision making and response to patient.   Namiko Pritts Cody King Pinzon, PA-C    

## 2019-03-31 ENCOUNTER — Ambulatory Visit: Payer: 59 | Attending: Internal Medicine

## 2019-03-31 DIAGNOSIS — Z20822 Contact with and (suspected) exposure to covid-19: Secondary | ICD-10-CM

## 2019-04-01 LAB — NOVEL CORONAVIRUS, NAA: SARS-CoV-2, NAA: NOT DETECTED

## 2019-06-23 ENCOUNTER — Other Ambulatory Visit: Payer: Self-pay

## 2019-06-23 ENCOUNTER — Ambulatory Visit: Payer: Self-pay | Attending: Internal Medicine

## 2019-06-23 DIAGNOSIS — Z20822 Contact with and (suspected) exposure to covid-19: Secondary | ICD-10-CM

## 2019-06-24 LAB — NOVEL CORONAVIRUS, NAA: SARS-CoV-2, NAA: NOT DETECTED

## 2019-06-24 LAB — SARS-COV-2, NAA 2 DAY TAT

## 2019-08-14 ENCOUNTER — Encounter: Payer: Self-pay | Admitting: Registered Nurse

## 2019-08-19 ENCOUNTER — Ambulatory Visit
Admission: RE | Admit: 2019-08-19 | Discharge status: Absent without leave | Payer: Self-pay | Source: Ambulatory Visit | Attending: Physician Assistant | Admitting: Physician Assistant

## 2019-08-20 ENCOUNTER — Ambulatory Visit
Admission: RE | Admit: 2019-08-20 | Discharge: 2019-08-20 | Disposition: A | Discharge status: Home / Self Care | Payer: 59 | Source: Ambulatory Visit | Attending: Physician Assistant | Admitting: Physician Assistant

## 2019-08-20 ENCOUNTER — Other Ambulatory Visit: Payer: Self-pay

## 2019-08-20 DIAGNOSIS — N939 Abnormal uterine and vaginal bleeding, unspecified: Secondary | ICD-10-CM

## 2019-08-20 LAB — POCT URINE PREGNANCY: Preg Test, Ur: POSITIVE — AB

## 2019-08-20 NOTE — ED Triage Notes (Signed)
6/15 took 2 pregnancy tests and both are positive.  Today noticed a pantiliner had blood on a portion.  Intermittent cramping noticed today.  Patient still sees blood on tissue paper, patient has a very "low grade cramp" in low abdomen.    This is not her first pregnancy.  Has 3 prior pregnancies and has not felt like this

## 2019-08-20 NOTE — Discharge Instructions (Signed)
Go to [facility]

## 2019-08-21 ENCOUNTER — Ambulatory Visit: Payer: Self-pay | Admitting: Registered Nurse

## 2019-08-22 ENCOUNTER — Other Ambulatory Visit: Payer: Self-pay

## 2019-08-22 ENCOUNTER — Emergency Department (HOSPITAL_COMMUNITY)
Admission: EM | Admit: 2019-08-22 | Discharge: 2019-08-22 | Disposition: A | Discharge status: Home / Self Care | Payer: 59 | Attending: Emergency Medicine | Admitting: Emergency Medicine

## 2019-08-22 ENCOUNTER — Encounter (HOSPITAL_COMMUNITY): Payer: Self-pay | Admitting: *Deleted

## 2019-08-22 DIAGNOSIS — O26851 Spotting complicating pregnancy, first trimester: Secondary | ICD-10-CM | POA: Insufficient documentation

## 2019-08-22 DIAGNOSIS — Z5321 Procedure and treatment not carried out due to patient leaving prior to being seen by health care provider: Secondary | ICD-10-CM | POA: Diagnosis not present

## 2019-08-22 DIAGNOSIS — Z3A01 Less than 8 weeks gestation of pregnancy: Secondary | ICD-10-CM | POA: Diagnosis not present

## 2019-08-22 DIAGNOSIS — N938 Other specified abnormal uterine and vaginal bleeding: Secondary | ICD-10-CM | POA: Diagnosis not present

## 2019-08-22 NOTE — ED Triage Notes (Signed)
Reports pain and vag bleeding started last night and continues, just received confirmation she is [redacted] weeks pregnant.

## 2019-08-22 NOTE — ED Notes (Signed)
At 1024 pt gave labels to registration and states she is going to [facility]

## 2019-08-23 ENCOUNTER — Inpatient Hospital Stay (HOSPITAL_COMMUNITY): Payer: 59

## 2019-08-23 ENCOUNTER — Ambulatory Visit: Payer: Self-pay

## 2019-08-23 ENCOUNTER — Inpatient Hospital Stay (HOSPITAL_COMMUNITY)
Admission: AD | Admit: 2019-08-23 | Discharge: 2019-08-23 | Disposition: A | Discharge status: Home / Self Care | Payer: 59 | Attending: Obstetrics and Gynecology | Admitting: Obstetrics and Gynecology

## 2019-08-23 ENCOUNTER — Other Ambulatory Visit: Payer: Self-pay

## 2019-08-23 ENCOUNTER — Encounter (HOSPITAL_COMMUNITY): Payer: Self-pay | Admitting: Obstetrics and Gynecology

## 2019-08-23 DIAGNOSIS — O26891 Other specified pregnancy related conditions, first trimester: Secondary | ICD-10-CM | POA: Diagnosis not present

## 2019-08-23 DIAGNOSIS — R109 Unspecified abdominal pain: Secondary | ICD-10-CM

## 2019-08-23 DIAGNOSIS — O3680X0 Pregnancy with inconclusive fetal viability, not applicable or unspecified: Secondary | ICD-10-CM | POA: Diagnosis not present

## 2019-08-23 DIAGNOSIS — Z3A01 Less than 8 weeks gestation of pregnancy: Secondary | ICD-10-CM | POA: Diagnosis not present

## 2019-08-23 DIAGNOSIS — M549 Dorsalgia, unspecified: Secondary | ICD-10-CM | POA: Diagnosis not present

## 2019-08-23 DIAGNOSIS — O4691 Antepartum hemorrhage, unspecified, first trimester: Secondary | ICD-10-CM | POA: Diagnosis not present

## 2019-08-23 DIAGNOSIS — O209 Hemorrhage in early pregnancy, unspecified: Secondary | ICD-10-CM | POA: Insufficient documentation

## 2019-08-23 LAB — HIV ANTIBODY (ROUTINE TESTING W REFLEX): HIV Screen 4th Generation wRfx: NONREACTIVE

## 2019-08-23 LAB — URINALYSIS, ROUTINE W REFLEX MICROSCOPIC
Bacteria, UA: NONE SEEN
Bilirubin Urine: NEGATIVE
Glucose, UA: NEGATIVE mg/dL
Ketones, ur: NEGATIVE mg/dL
Leukocytes,Ua: NEGATIVE
Nitrite: NEGATIVE
Protein, ur: NEGATIVE mg/dL
RBC / HPF: >50 RBC/hpf — ABNORMAL HIGH (ref 0–5)
Specific Gravity, Urine: 1.02 (ref 1.005–1.030)
pH: 5 (ref 5.0–8.0)

## 2019-08-23 LAB — CBC
HCT: 37.1 % (ref 36.0–46.0)
Hemoglobin: 12.3 g/dL (ref 12.0–15.0)
MCH: 26.6 pg (ref 26.0–34.0)
MCHC: 33.2 g/dL (ref 30.0–36.0)
MCV: 80.3 fL (ref 80.0–100.0)
Platelets: 266 10*3/uL (ref 150–400)
RBC: 4.62 MIL/uL (ref 3.87–5.11)
RDW: 13.1 % (ref 11.5–15.5)
WBC: 3.7 10*3/uL — ABNORMAL LOW (ref 4.0–10.5)
nRBC: 0 % (ref 0.0–0.2)

## 2019-08-23 LAB — WET PREP, GENITAL
Sperm: NONE SEEN
Trich, Wet Prep: NONE SEEN
Yeast Wet Prep HPF POC: NONE SEEN

## 2019-08-23 LAB — HCG, QUANTITATIVE, PREGNANCY: hCG, Beta Chain, Quant, S: 11 m[IU]/mL — ABNORMAL HIGH (ref <5)

## 2019-08-23 IMAGING — US US OB < 14 WEEKS - US OB TV
1 series · 15 of 28 positions shown · non-contrast
Comparison: None.

CLINICAL DATA: Vaginal bleeding.  Positive pregnancy test

EXAM:
OBSTETRIC <14 WK US AND TRANSVAGINAL OB US
TECHNIQUE: Both transabdominal and transvaginal ultrasound examinations were
performed for complete evaluation of the gestation as well as the
maternal uterus, adnexal regions, and pelvic cul-de-sac.
Transvaginal technique was performed to assess early pregnancy.

[Series 1: us ob < 14 weeks - us ob tv · 15 of 51 slices shown]
[im 1/51]
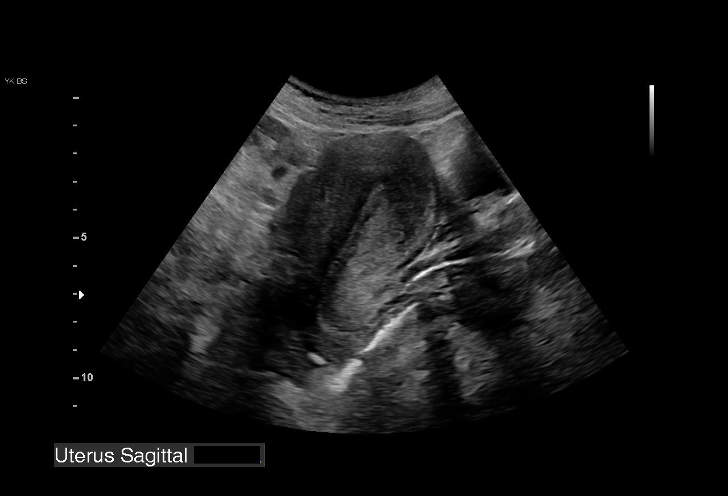
[im 4/51]
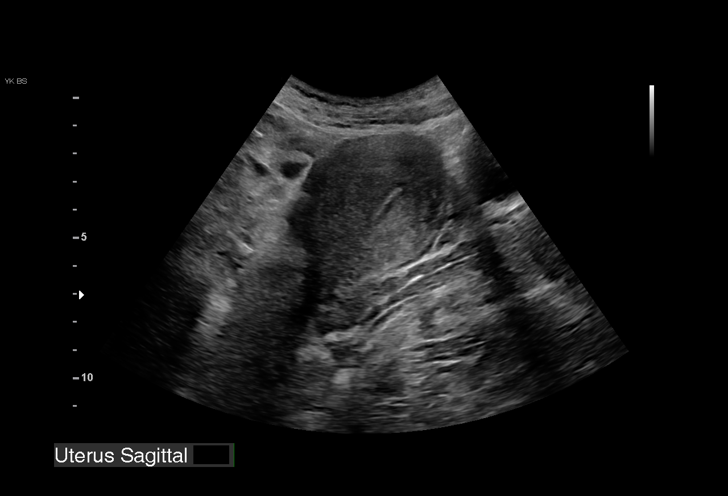
[im 8/51]
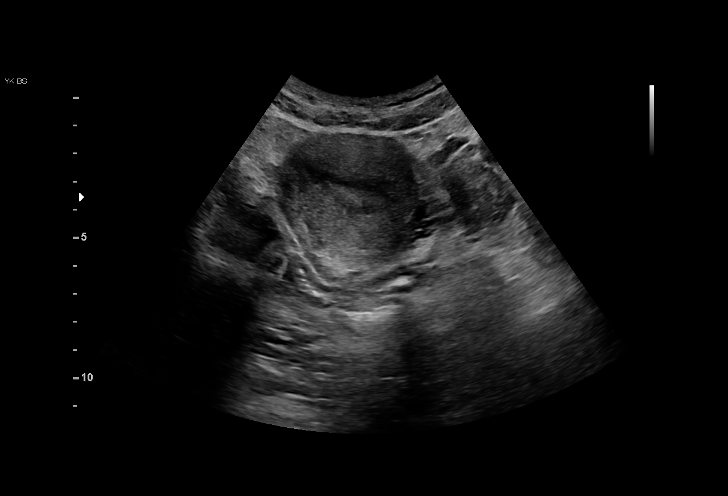
[im 12/51]
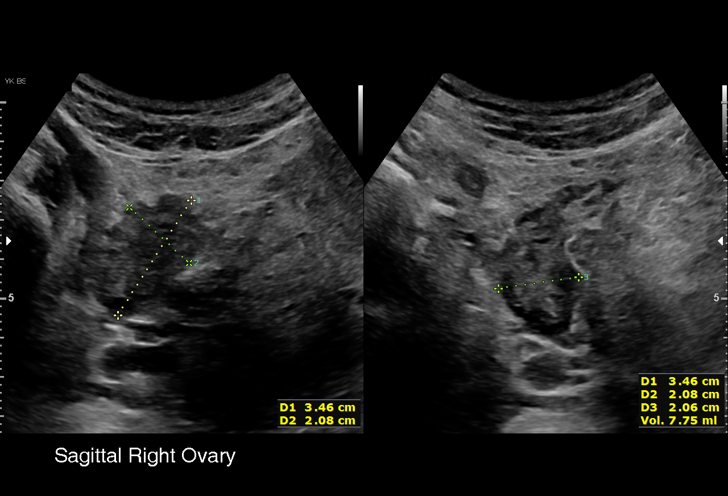
[im 15/51]
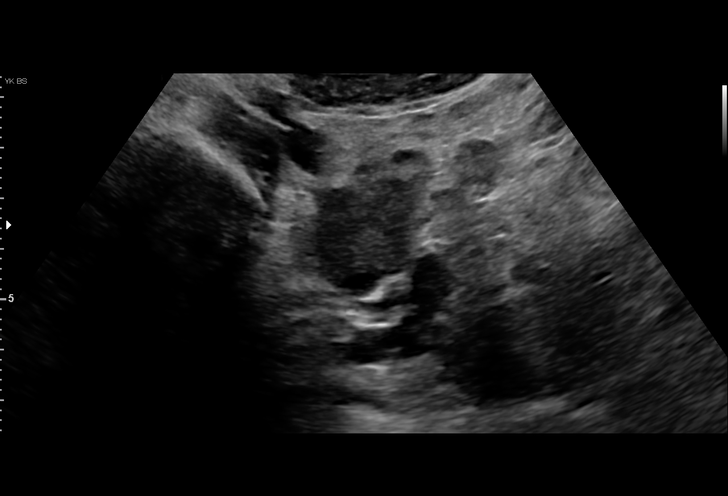
[im 19/51]
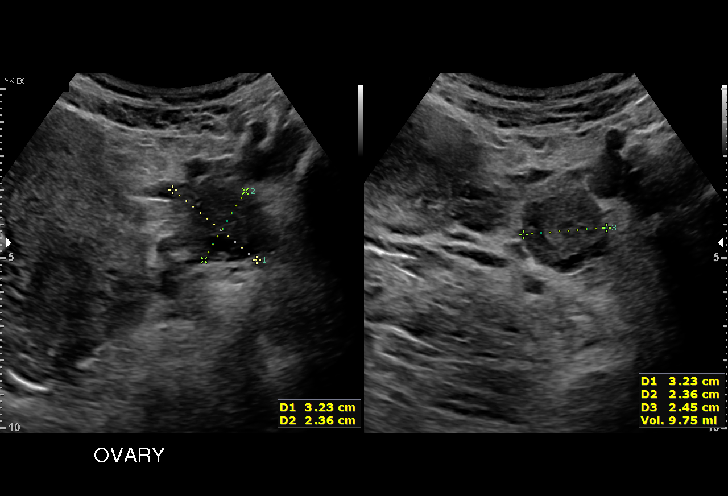
[im 23/51]
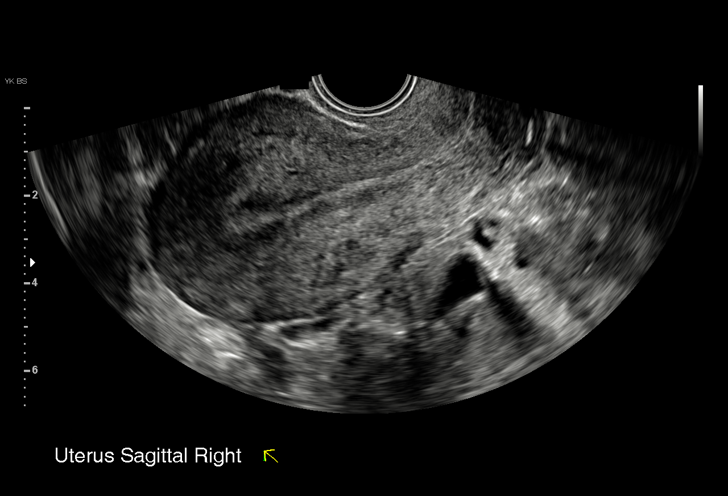
[im 26/51]
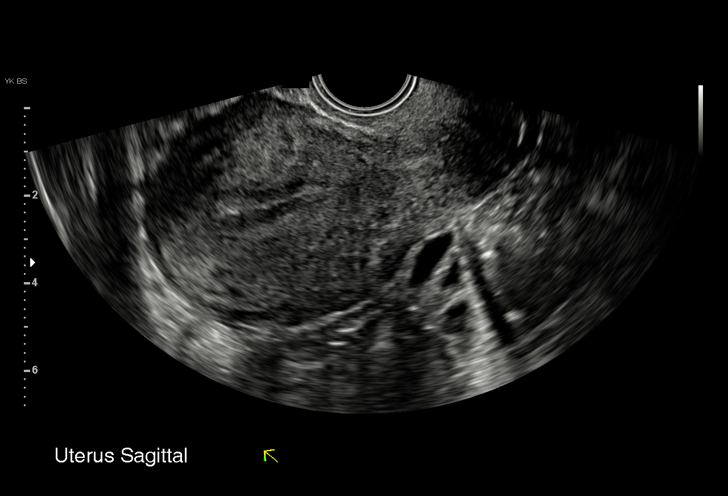
[im 28/51]
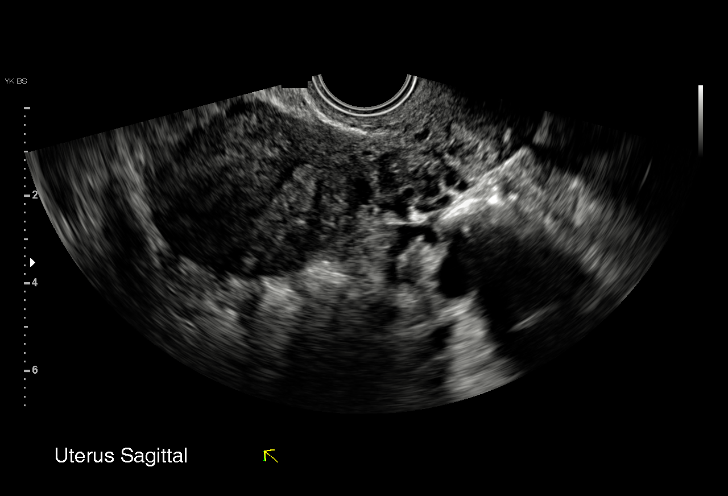
[im 32/51]
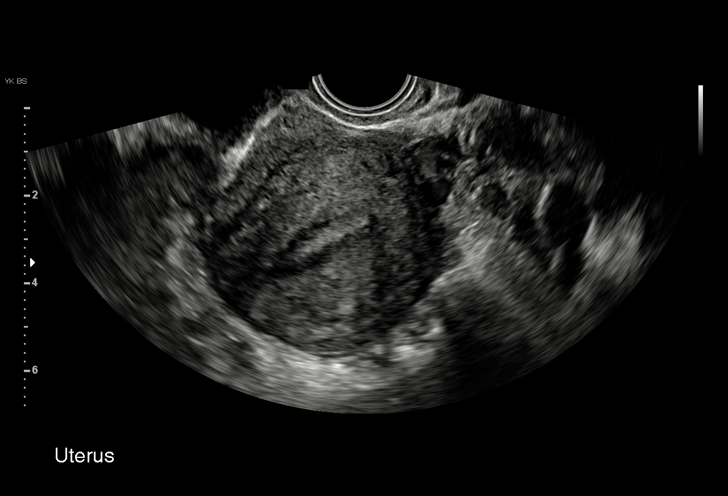
[im 36/51]
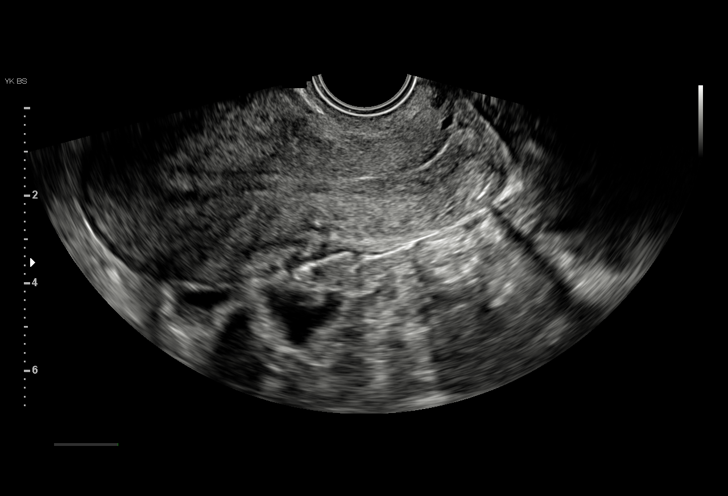
[im 39/51]
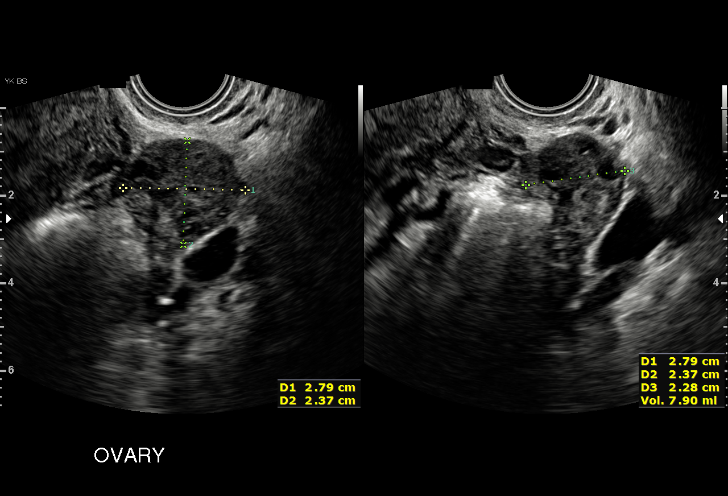
[im 43/51]
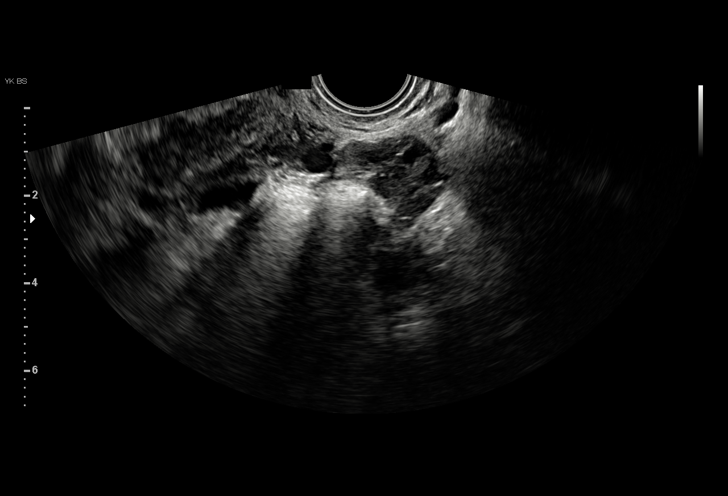
[im 47/51]
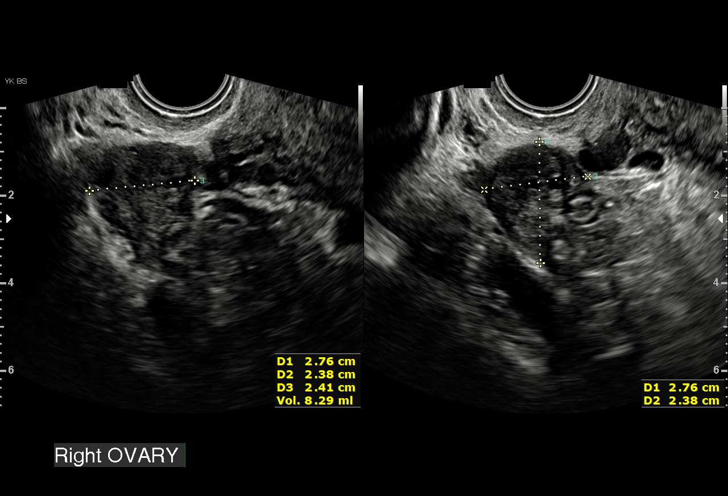
[im 51/51]
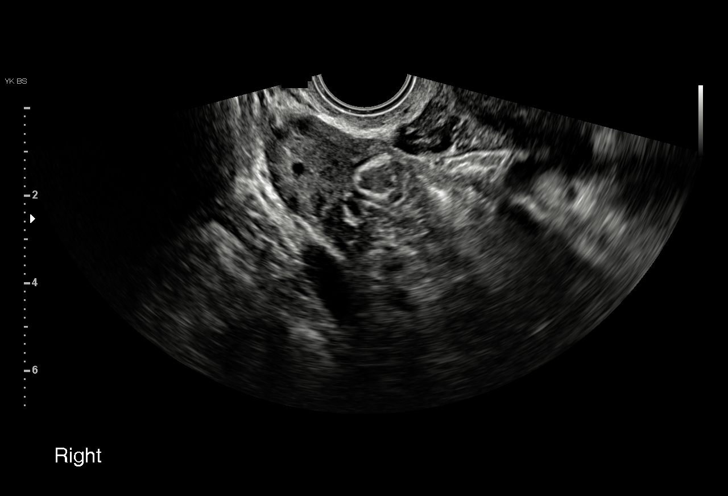

[15 of 28 positions shown; findings below may reference images not displayed]

FINDINGS: Intrauterine gestational sac: None

Yolk sac:  Not Visualized.

Embryo:  Not Visualized.

Cardiac Activity: Not Visualized.

Maternal uterus/adnexae: Right ovary measures 3.5 x 2.1 x 2.1 cm and
appears unremarkable. Left ovary measures 3.2 x 2.4 x 2.5 cm and
appears unremarkable. Anteverted uterus. Endometrial thickness of 5
mm. No evidence of a intrauterine gestational sac. No free fluid
within the pelvis. No adnexal mass identified.
IMPRESSION: No evidence of intrauterine pregnancy. No adnexal mass or free fluid
identified. Recommend follow-up quantitative B-HCG levels and
short-term follow-up US in 14 days.

## 2019-08-23 NOTE — [facility] Note (Signed)
Tasha Wu is a 29 y.o. at [redacted]w[redacted]d here in [facility] reporting: vaginal bleeding that started on June 18. States it was pink but it has been getting heavier. Changing a pad about every 4 hours. Passing a few small clots. Is also having abdominal and back pain. Also nauseated.   LMP: 07/09/19  Onset of complaint: June 18  Pain score: 7/10  Vitals:   08/23/19 1016  BP: 126/73  Pulse: 81  Resp: 16  Temp: 98.1 F (36.7 C)  SpO2: 100%     Lab orders placed from triage: [facility]A

## 2019-08-24 LAB — GC/CHLAMYDIA PROBE AMP (~~LOC~~) NOT AT ARMC
Chlamydia: NEGATIVE
Comment: NEGATIVE
Comment: NORMAL
Neisseria Gonorrhea: NEGATIVE

## 2019-08-25 ENCOUNTER — Other Ambulatory Visit: Payer: 59

## 2019-08-27 NOTE — MAU Provider Note (Signed)
Chief Complaint: Abdominal Pain, Vaginal Bleeding, and Nausea   None     SUBJECTIVE HPI: Tasha Wu is a 29 y.o. [id number] at [redacted]w[redacted]d by LMP who presents to maternity admissions reporting abdominal cramping vaginal bleeding.  There are no other associated symptoms.    Location: abdomen Quality: cramping Severity: 7/10 on pain scale Duration: 2 days Timing: intermittent Modifying factors: none Associated signs and symptoms: nausea and vaginal bleeding  HPI  Past Medical History:  Diagnosis Date  . Abnormal Pap smear 12/10/2011  . Anemia   . Anxiety 2010   anxiety attack;no meds;has not been dx'd  . Emotional instability (HCC)    Break down easily when things are not going her way  . GERD (gastroesophageal reflux disease)   . Hx of joint problems    Shoulders and knees have pain  . Infection    Yeast;would get frequently  . Infection    BV x 1  . Kidney infection 2010   PO antibxs  . Kidney stone complicating pregnancy   . Migraines    no rx'd meds in the past;goes to sleep  . PCOS (polycystic ovarian syndrome)   . Sickle cell trait (HCC)   . Sickle cell trait (HCC)   . Umbilical hernia 1991   Does not cause anyp problems  . Vaginal Pap smear, abnormal    Past Surgical History:  Procedure Laterality Date  . DILATION AND CURETTAGE OF UTERUS  2009   No complications  . WISDOM TOOTH EXTRACTION     Social History   Socioeconomic History  . Marital status: Single    Spouse name: Not on file  . Number of children: 2  . Years of education: 41  . Highest education level: Not on file  Occupational History  . Not on file  Tobacco Use  . Smoking status: Former Smoker    Packs/day: 0.25    Types: Cigarettes    Quit date: 11/18/2011    Years since quitting: 7.7  . Smokeless tobacco: Never Used  Vaping Use  . Vaping Use: Never used  Substance and Sexual Activity  . Alcohol use: Not Currently    Comment: occ  . Drug use: No  . Sexual activity: Yes     Partners: Male    Birth control/protection: None  Other Topics Concern  . Not on file  Social History Narrative  . Not on file   Social Determinants of Health   Financial Resource Strain:   . Difficulty of Paying Living Expenses:   Food Insecurity:   . Worried About Programme researcher, broadcasting/film/video in the Last Year:   . Barista in the Last Year:   Transportation Needs:   . Freight forwarder (Medical):   Marland Kitchen Lack of Transportation (Non-Medical):   Physical Activity:   . Days of Exercise per Week:   . Minutes of Exercise per Session:   Stress:   . Feeling of Stress :   Social Connections:   . Frequency of Communication with Friends and Family:   . Frequency of Social Gatherings with Friends and Family:   . Attends Religious Services:   . Active Member of Clubs or Organizations:   . Attends Banker Meetings:   Marland Kitchen Marital Status:   Intimate Partner Violence:   . Fear of Current or Ex-Partner:   . Emotionally Abused:   Marland Kitchen Physically Abused:   . Sexually Abused:    No current facility-administered medications on file prior to encounter.  Current Outpatient Medications on File Prior to Encounter  Medication Sig Dispense Refill  . albuterol (VENTOLIN HFA) 108 (90 Base) MCG/ACT inhaler Inhale 2 puffs into the lungs every 6 (six) hours as needed for wheezing or shortness of breath. 8 g 0  . benzonatate (TESSALON PERLES) 100 MG capsule Take 1 capsule (100 mg total) by mouth 3 (three) times daily as needed. 20 capsule 0  . cetirizine (ZYRTEC) 10 MG tablet Take 1 tablet (10 mg total) by mouth daily. 30 tablet 11   No Known Allergies  ROS:  Review of Systems  Constitutional: Negative for chills, fatigue and fever.  Respiratory: Negative for shortness of breath.   Cardiovascular: Negative for chest pain.  Gastrointestinal: Positive for abdominal pain and nausea. Negative for vomiting.  Genitourinary: Positive for pelvic pain and vaginal bleeding. Negative for difficulty  urinating, dysuria, flank pain, vaginal discharge and vaginal pain.  Neurological: Negative for dizziness and headaches.  Psychiatric/Behavioral: Negative.      I have reviewed patient's Past Medical Hx, Surgical Hx, Family Hx, Social Hx, medications and allergies.   Physical Exam    BP 106/64   Pulse 65   Temp 98.1 F (36.7 C) (Oral)   Resp 16   Ht 5\' 7"  (1.702 m)   Wt 64 kg   LMP 07/09/2019   SpO2 100% Comment: room air  BMI 22.10 kg/m  Constitutional: Well-developed, well-nourished female in no acute distress.  Cardiovascular: normal rate Respiratory: normal effort GI: Abd soft, non-tender. Pos BS x 4 MS: Extremities nontender, no edema, normal ROM Neurologic: Alert and oriented x 4.  GU: Neg CVAT.  PELVIC EXAM: Cervix pink, visually closed, without lesion, small amount dark red bleeding that did not require foxswab to visualize cervix, vaginal walls and external genitalia normal Bimanual exam: Cervix 0/long/high, firm, anterior, neg CMT, uterus nontender, not enlarged, adnexa without tenderness, enlargement, or mass  LAB RESULTS   Recent Results (from the past 2160 hour(s))  Urinalysis, Routine w reflex microscopic     Status: Abnormal   Collection Time: 08/23/19 10:44 AM  Result Value Ref Range   Color, Urine YELLOW YELLOW   APPearance HAZY (A) CLEAR   Specific Gravity, Urine 1.020 1.005 - 1.030   pH 5.0 5.0 - 8.0   Glucose, UA NEGATIVE NEGATIVE mg/dL   Hgb urine dipstick LARGE (A) NEGATIVE   Bilirubin Urine NEGATIVE NEGATIVE   Ketones, ur NEGATIVE NEGATIVE mg/dL   Protein, ur NEGATIVE NEGATIVE mg/dL   Nitrite NEGATIVE NEGATIVE   Leukocytes,Ua NEGATIVE NEGATIVE   RBC / HPF >50 (H) 0 - 5 RBC/hpf   WBC, UA 0-5 0 - 5 WBC/hpf   Bacteria, UA NONE SEEN NONE SEEN   Squamous Epithelial / LPF 0-5 0 - 5   Mucus PRESENT     Comment: Performed at [facility], [address] [place], [place]  GC/Chlamydia probe amp (Eleanor)not at [facility]      Status: None   Collection Time: 08/23/19 10:53 AM  Result Value Ref Range   Neisseria Gonorrhea Negative    Chlamydia Negative    Comment Normal Reference Ranger Chlamydia - Negative    Comment      Normal Reference Range Neisseria Gonorrhea - Negative  CBC     Status: Abnormal   Collection Time: 08/23/19 11:15 AM  Result Value Ref Range   WBC 3.7 (L) 4.0 - 10.5 K/uL   RBC 4.62 3.87 - 5.11 MIL/uL   Hemoglobin 12.3 12.0 - 15.0  g/dL   HCT 37.1 36 - 46 %   MCV 80.3 80.0 - 100.0 fL   MCH 26.6 26.0 - 34.0 pg   MCHC 33.2 30.0 - 36.0 g/dL   RDW 13.1 11.5 - 15.5 %   Platelets 266 150 - 400 K/uL   nRBC 0.0 0.0 - 0.2 %    Comment: Performed at [facility], [place] [address], [place], [place] [address]  hCG, quantitative, pregnancy     Status: Abnormal   Collection Time: 08/23/19 11:15 AM  Result Value Ref Range   hCG, Beta Chain, Quant, S 11 (H) <5 mIU/mL    Comment:          GEST. AGE      CONC.  (mIU/mL)   <=1 WEEK        5 - 50     2 WEEKS       50 - 500     3 WEEKS       100 - 10,000     4 WEEKS     1,000 - 30,000     5 WEEKS     3,500 - 115,000   6-8 WEEKS     12,000 - 270,000    12 WEEKS     15,000 - 220,000        FEMALE AND NON-PREGNANT FEMALE:     LESS THAN 5 mIU/mL Performed at [facility], [place] [address] [place], [place] [address]   HIV Antibody (routine testing w rflx)     Status: None   Collection Time: 08/23/19 11:15 AM  Result Value Ref Range   HIV Screen 4th Generation wRfx Non Reactive Non Reactive    Comment: Performed at [facility], [place] [address] [place],  [address]  Wet prep, genital     Status: Abnormal   Collection Time: 08/23/19 12:02 PM   Specimen: PATH Cytology Cervicovaginal Ancillary Only  Result Value Ref Range   Yeast Wet Prep HPF POC NONE SEEN NONE SEEN   Trich, Wet Prep NONE SEEN NONE SEEN   Clue Cells Wet Prep HPF POC PRESENT (A) NONE SEEN   WBC, Wet Prep HPF POC FEW (A) NONE SEEN   Sperm NONE SEEN     Comment:  Performed at [facility], [place] [address] [place],  [address]      IMAGING [place] LESS THAN 14 WEEKS WITH [place] TRANSVAGINAL  Result Date: 08/23/2019 CLINICAL DATA:  Vaginal bleeding.  Positive pregnancy test EXAM: OBSTETRIC <14 WK [place] AND TRANSVAGINAL OB US TECHNIQUE: Both transabdominal and transvaginal ultrasound examinations were performed for complete evaluation of the gestation as well as the maternal uterus, adnexal regions, and pelvic cul-de-sac. Transvaginal technique was performed to assess early pregnancy. COMPARISON:  None. FINDINGS: Intrauterine gestational sac: None Yolk sac:  Not Visualized. Embryo:  Not Visualized. Cardiac Activity: Not Visualized. Maternal uterus/adnexae: Right ovary measures 3.5 x 2.1 x 2.1 cm and appears unremarkable. Left ovary measures 3.2 x 2.4 x 2.5 cm and appears unremarkable. Anteverted uterus. Endometrial thickness of 5 mm. No evidence of a intrauterine gestational sac. No free fluid within the pelvis. No adnexal mass identified. IMPRESSION: No evidence of intrauterine pregnancy. No adnexal mass or free fluid identified. Recommend follow-up quantitative B-HCG levels and short-term follow-up US in 14 days. Electronically Signed   By: Davina Poke D.O.   On: 08/23/2019 12:34    MAU Management/MDM: Orders Placed This Encounter  Procedures  .  Wet prep, genital  . US OB LESS THAN 14 WEEKS WITH OB TRANSVAGINAL  . Urinalysis, Routine w reflex microscopic  . CBC  . hCG, quantitative, pregnancy  . HIV Antibody (routine testing w rflx)  . Discharge patient    No orders of the defined types were placed in this encounter.   Findings today could represent a normal early pregnancy, spontaneous abortion or ectopic pregnancy which can be life-threatening.  Ectopic precautions were given to the patient with plan to return in 48 hours for repeat quant hcg to evaluate pregnancy development.  Appt made 6/22 at [facility]  Pt discharged with  strict ectopic precautions.  ASSESSMENT 1. Pregnancy of unknown anatomic location   2. Abdominal pain during pregnancy in first trimester   3. Vaginal bleeding in pregnancy, first trimester     PLAN Discharge home Allergies as of 08/23/2019   No Known Allergies     Medication List    STOP taking these medications   ibuprofen 600 MG tablet Commonly known as: ADVIL   Promethazine-Codeine 6.25-10 MG/5ML Soln   SPRINTEC 28 PO     TAKE these medications   albuterol 108 (90 Base) MCG/ACT inhaler Commonly known as: VENTOLIN HFA Inhale 2 puffs into the lungs every 6 (six) hours as needed for wheezing or shortness of breath.   benzonatate 100 MG capsule Commonly known as: Tessalon Perles Take 1 capsule (100 mg total) by mouth 3 (three) times daily as needed.   cetirizine 10 MG tablet Commonly known as: ZYRTEC Take 1 tablet (10 mg total) by mouth daily.       Follow-up Information    [facility] Follow up.   Specialty: Obstetrics and Gynecology Why: As scheduled on Tuesday, 08/25/19, for labwork. Return to MAU for worsening symptoms. Contact information: [address] [place] [address] [phone]              Sharen Counter Certified Nurse-Midwife 08/27/2019  8:46 PM

## 2019-11-14 ENCOUNTER — Encounter: Payer: Self-pay | Admitting: Emergency Medicine

## 2019-11-14 ENCOUNTER — Ambulatory Visit: Payer: Self-pay

## 2019-11-14 ENCOUNTER — Ambulatory Visit
Admission: EM | Admit: 2019-11-14 | Discharge: 2019-11-14 | Disposition: A | Discharge status: Home / Self Care | Payer: 59 | Attending: Emergency Medicine | Admitting: Emergency Medicine

## 2019-11-14 ENCOUNTER — Other Ambulatory Visit: Payer: Self-pay

## 2019-11-14 DIAGNOSIS — O219 Vomiting of pregnancy, unspecified: Secondary | ICD-10-CM | POA: Diagnosis not present

## 2019-11-14 DIAGNOSIS — Z3A01 Less than 8 weeks gestation of pregnancy: Secondary | ICD-10-CM | POA: Diagnosis not present

## 2019-11-14 LAB — POCT URINE PREGNANCY: Preg Test, Ur: POSITIVE — AB

## 2019-11-14 MED ORDER — DOXYLAMINE-PYRIDOXINE 10-10 MG PO TBEC: 10.0000 mg | DELAYED_RELEASE_TABLET | Freq: Every day | ORAL | 0 refills | Status: DC

## 2019-11-14 MED ORDER — ONDANSETRON 4 MG PO TBDP
4.0000 mg | ORAL_TABLET | Freq: Once | ORAL | Status: AC
  Administered 2019-11-14: 4 mg via ORAL

## 2019-11-14 NOTE — ED Triage Notes (Signed)
Pt sts positive home pregnancy test last week with LMP 09/24/2019; pt sts nausea and vomiting this week; pt G5P3M1; denies bleeding

## 2019-11-14 NOTE — Discharge Instructions (Addendum)
Important to avoid food triggers. May take nausea medication before bedtime x1 week, then twice daily. Important to keep OB appointment. Go to [facility] for lower abdominal/pelvic pain, vaginal discharge or bleeding.

## 2019-11-14 NOTE — ED Provider Notes (Signed)
[facility]    CSN: [id number] Arrival date & time: 11/14/19  1056      History   Chief Complaint Chief Complaint  Patient presents with  . Appointment    1100  . Nausea    HPI Tasha Wu is a 29 y.o. female  Presenting for pregnancy testing and treatment of nausea.  Has been having nausea and vomiting for the last week: No projectile vomiting, biliary or bloody emesis.  No fever, severe abdominal pain, pelvic pain, vaginal discharge or bleeding.  LMP 09/24/2019.  Home pregnancy test positive.  Has not take anything for this.  Past Medical History:  Diagnosis Date  . Abnormal Pap smear 12/10/2011  . Anemia   . Anxiety 2010   anxiety attack;no meds;has not been dx'd  . Emotional instability (HCC)    Break down easily when things are not going her way  . GERD (gastroesophageal reflux disease)   . Hx of joint problems    Shoulders and knees have pain  . Infection    Yeast;would get frequently  . Infection    BV x 1  . Kidney infection 2010   PO antibxs  . Kidney stone complicating pregnancy   . Migraines    no rx'd meds in the past;goes to sleep  . PCOS (polycystic ovarian syndrome)   . Sickle cell trait (HCC)   . Sickle cell trait (HCC)   . Umbilical hernia 1991   Does not cause anyp problems  . Vaginal Pap smear, abnormal     Patient Active Problem List   Diagnosis Date Noted  . SVD (spontaneous vaginal delivery) 02/21/2018  . Anemia 12/21/2017  . Pregnant 07/24/2017  . Sickle cell trait (HCC) 07/21/2017  . Anxiety 10/03/2016  . Gastroesophageal reflux disease 10/03/2016  . Normal labor 12/28/2013  . Post term pregnancy, antepartum condition or complication 06/18/2012  . Abnormal Pap smear 12/10/2011  . Umbilical hernia 11/29/2011  . H/O pyelonephritis 11/29/2011    Past Surgical History:  Procedure Laterality Date  . DILATION AND CURETTAGE OF UTERUS  2009   No complications  . WISDOM TOOTH EXTRACTION      OB History    Gravida   5   Para  3   Term  3   Preterm      AB  1   Living  3     SAB      TAB      Ectopic      Multiple  0   Live Births  3            Home Medications    Prior to Admission medications   Medication Sig Start Date End Date Taking? Authorizing Provider  albuterol (VENTOLIN HFA) 108 (90 Base) MCG/ACT inhaler Inhale 2 puffs into the lungs every 6 (six) hours as needed for wheezing or shortness of breath. 12/24/18   Junie Spencer, FNP  cetirizine (ZYRTEC) 10 MG tablet Take 1 tablet (10 mg total) by mouth daily. 12/09/18   Janeece Agee, NP  Doxylamine-Pyridoxine 10-10 MG TBEC Take 10 mg by mouth at bedtime. 11/14/19   Hall-Potvin, [place], PA-C    Family History Family History  Problem Relation Age of Onset  . Sickle cell trait Mother   . Hypertension Mother   . Anemia Mother   . Sickle cell trait Brother   . Sickle cell anemia Maternal Aunt        Recently deceased from [facility] crisis  . Diabetes Maternal  Aunt   . Sickle cell anemia Maternal Grandmother        Deceased  . Heart disease Maternal Aunt   . Asthma Sister        1/2 sibling;father's child  . Hyperthyroidism Paternal Grandmother   . Scoliosis Paternal Grandmother   . Arthritis Paternal Grandmother   . Migraines Father   . Other Father        Joint Problems;shoulders and knees  . Hernia Maternal Aunt        Umbilical  . Hypertension Maternal Uncle   . Diabetes type I Cousin        maternal  . Diabetes Paternal Grandfather     Social History Social History   Tobacco Use  . Smoking status: Former Smoker    Packs/day: 0.25    Types: Cigarettes    Quit date: 11/18/2011    Years since quitting: 7.9  . Smokeless tobacco: Never Used  Vaping Use  . Vaping Use: Never used  Substance Use Topics  . Alcohol use: Not Currently    Comment: occ  . Drug use: No     Allergies   Patient has no known allergies.   Review of Systems As per HPI   Physical Exam Triage Vital Signs ED Triage  Vitals  Enc Vitals Group     BP 11/14/19 1116 120/74     Pulse Rate 11/14/19 1116 95     Resp 11/14/19 1116 18     Temp 11/14/19 1116 98.2 F (36.8 C)     Temp Source 11/14/19 1116 Oral     SpO2 11/14/19 1116 98 %     Weight --      Height --      Head Circumference --      Peak Flow --      Pain Score 11/14/19 1120 0     Pain Loc --      Pain Edu? --      Excl. in GC? --    No data found.  Updated Vital Signs BP 120/74 (BP Location: Left Arm)   Pulse 95   Temp 98.2 F (36.8 C) (Oral)   Resp 18   LMP 09/24/2019   SpO2 98%   Visual Acuity Right Eye Distance:   Left Eye Distance:   Bilateral Distance:    Right Eye Near:   Left Eye Near:    Bilateral Near:     Physical Exam Constitutional:      General: She is not in acute distress. HENT:     Head: Normocephalic and atraumatic.  Eyes:     General: No scleral icterus.    Pupils: Pupils are equal, round, and reactive to light.  Cardiovascular:     Rate and Rhythm: Normal rate.  Pulmonary:     Effort: Pulmonary effort is normal. No respiratory distress.     Breath sounds: No wheezing.  Abdominal:     Palpations: Abdomen is soft.     Tenderness: There is no abdominal tenderness.  Skin:    Coloration: Skin is not jaundiced or pale.  Neurological:     Mental Status: She is alert and oriented to person, place, and time.      UC Treatments / Results  Labs (all labs ordered are listed, but only abnormal results are displayed) Labs Reviewed  POCT URINE PREGNANCY - Abnormal; Notable for the following components:      Result Value   Preg Test, Ur Positive (*)    All  other components within normal limits    EKG   Radiology No results found.  Procedures Procedures (including critical care time)  Medications Ordered in UC Medications - No data to display  Initial Impression / Assessment and Plan / UC Course  I have reviewed the triage vital signs and the nursing notes.  Pertinent labs & imaging  results that were available during my care of the patient were reviewed by me and considered in my medical decision making (see chart for details).     Patient given Zofran in office: Tolerated well with improvement of nausea.  No emesis in office.  Urine pregnancy positive.  Patient already has appointment scheduled with OB for October.  Appears well-hydrated at this time.  Will push fluids, trial D-P as outlined below.  Provided handout for medications safe in pregnancy. Final Clinical Impressions(s) / UC Diagnoses   Final diagnoses:  Less than [redacted] weeks gestation of pregnancy  Nausea and vomiting during pregnancy     Discharge Instructions     Important to avoid food triggers. May take nausea medication before bedtime x1 week, then twice daily. Important to keep OB appointment. Go to women's hospital for lower abdominal/pelvic pain, vaginal discharge or bleeding.    ED Prescriptions    Medication Sig Dispense Auth. Provider   Doxylamine-Pyridoxine 10-10 MG TBEC Take 10 mg by mouth at bedtime. 60 tablet Hall-Potvin, [place], PA-C     PDMP not reviewed this encounter.   Hall-Potvin, [place], [place] 11/14/19 1203

## 2019-12-04 ENCOUNTER — Emergency Department (HOSPITAL_COMMUNITY): Payer: POS

## 2019-12-04 ENCOUNTER — Encounter (HOSPITAL_COMMUNITY): Payer: Self-pay

## 2019-12-04 ENCOUNTER — Inpatient Hospital Stay (HOSPITAL_COMMUNITY)
Admission: EM | Admit: 2019-12-04 | Discharge: 2019-12-09 | DRG: 818 | Disposition: A | Discharge status: Home / Self Care | Payer: POS | Attending: Internal Medicine | Admitting: Internal Medicine

## 2019-12-04 ENCOUNTER — Other Ambulatory Visit: Payer: Self-pay

## 2019-12-04 ENCOUNTER — Inpatient Hospital Stay (HOSPITAL_COMMUNITY): Payer: POS

## 2019-12-04 DIAGNOSIS — O88811 Other embolism in pregnancy, first trimester: Secondary | ICD-10-CM | POA: Diagnosis present

## 2019-12-04 DIAGNOSIS — O99011 Anemia complicating pregnancy, first trimester: Secondary | ICD-10-CM | POA: Diagnosis present

## 2019-12-04 DIAGNOSIS — D573 Sickle-cell trait: Secondary | ICD-10-CM | POA: Diagnosis present

## 2019-12-04 DIAGNOSIS — O0991 Supervision of high risk pregnancy, unspecified, first trimester: Secondary | ICD-10-CM | POA: Diagnosis not present

## 2019-12-04 DIAGNOSIS — Z20822 Contact with and (suspected) exposure to covid-19: Secondary | ICD-10-CM | POA: Diagnosis present

## 2019-12-04 DIAGNOSIS — Z87891 Personal history of nicotine dependence: Secondary | ICD-10-CM

## 2019-12-04 DIAGNOSIS — I959 Hypotension, unspecified: Secondary | ICD-10-CM | POA: Diagnosis present

## 2019-12-04 DIAGNOSIS — O208 Other hemorrhage in early pregnancy: Secondary | ICD-10-CM | POA: Diagnosis present

## 2019-12-04 DIAGNOSIS — K219 Gastro-esophageal reflux disease without esophagitis: Secondary | ICD-10-CM | POA: Diagnosis present

## 2019-12-04 DIAGNOSIS — G43109 Migraine with aura, not intractable, without status migrainosus: Secondary | ICD-10-CM | POA: Diagnosis present

## 2019-12-04 DIAGNOSIS — I493 Ventricular premature depolarization: Secondary | ICD-10-CM | POA: Diagnosis present

## 2019-12-04 DIAGNOSIS — O99281 Endocrine, nutritional and metabolic diseases complicating pregnancy, first trimester: Secondary | ICD-10-CM | POA: Diagnosis present

## 2019-12-04 DIAGNOSIS — E876 Hypokalemia: Secondary | ICD-10-CM

## 2019-12-04 DIAGNOSIS — I313 Pericardial effusion (noninflammatory): Secondary | ICD-10-CM | POA: Diagnosis present

## 2019-12-04 DIAGNOSIS — O99891 Other specified diseases and conditions complicating pregnancy: Secondary | ICD-10-CM | POA: Diagnosis present

## 2019-12-04 DIAGNOSIS — I2602 Saddle embolus of pulmonary artery with acute cor pulmonale: Secondary | ICD-10-CM | POA: Diagnosis not present

## 2019-12-04 DIAGNOSIS — R079 Chest pain, unspecified: Secondary | ICD-10-CM

## 2019-12-04 DIAGNOSIS — D649 Anemia, unspecified: Secondary | ICD-10-CM | POA: Diagnosis present

## 2019-12-04 DIAGNOSIS — Z3490 Encounter for supervision of normal pregnancy, unspecified, unspecified trimester: Secondary | ICD-10-CM

## 2019-12-04 DIAGNOSIS — O99611 Diseases of the digestive system complicating pregnancy, first trimester: Secondary | ICD-10-CM | POA: Diagnosis present

## 2019-12-04 DIAGNOSIS — I2699 Other pulmonary embolism without acute cor pulmonale: Secondary | ICD-10-CM | POA: Diagnosis present

## 2019-12-04 DIAGNOSIS — Z3A09 9 weeks gestation of pregnancy: Secondary | ICD-10-CM

## 2019-12-04 DIAGNOSIS — F419 Anxiety disorder, unspecified: Secondary | ICD-10-CM | POA: Diagnosis present

## 2019-12-04 LAB — BASIC METABOLIC PANEL
Anion gap: 9 (ref 5–15)
BUN: 11 mg/dL (ref 6–20)
CO2: 22 mmol/L (ref 22–32)
Calcium: 8.9 mg/dL (ref 8.9–10.3)
Chloride: 103 mmol/L (ref 98–111)
Creatinine, Ser: 0.86 mg/dL (ref 0.44–1.00)
GFR calc Af Amer: >60 mL/min (ref >60)
GFR calc non Af Amer: >60 mL/min (ref >60)
Glucose, Bld: 112 mg/dL — ABNORMAL HIGH (ref 70–99)
Potassium: 3.3 mmol/L — ABNORMAL LOW (ref 3.5–5.1)
Sodium: 134 mmol/L — ABNORMAL LOW (ref 135–145)

## 2019-12-04 LAB — MRSA PCR SCREENING: MRSA by PCR: NEGATIVE

## 2019-12-04 LAB — CBC
HCT: 34.8 % — ABNORMAL LOW (ref 36.0–46.0)
Hemoglobin: 12.1 g/dL (ref 12.0–15.0)
MCH: 27.6 pg (ref 26.0–34.0)
MCHC: 34.8 g/dL (ref 30.0–36.0)
MCV: 79.3 fL — ABNORMAL LOW (ref 80.0–100.0)
Platelets: 217 10*3/uL (ref 150–400)
RBC: 4.39 MIL/uL (ref 3.87–5.11)
RDW: 12.9 % (ref 11.5–15.5)
WBC: 6.7 10*3/uL (ref 4.0–10.5)
nRBC: 0 % (ref 0.0–0.2)

## 2019-12-04 LAB — RESPIRATORY PANEL BY RT PCR (FLU A&B, COVID)
Influenza A by PCR: NEGATIVE
Influenza B by PCR: NEGATIVE
SARS Coronavirus 2 by RT PCR: NEGATIVE

## 2019-12-04 LAB — HCG, QUANTITATIVE, PREGNANCY: hCG, Beta Chain, Quant, S: 357054 m[IU]/mL — ABNORMAL HIGH (ref <5)

## 2019-12-04 LAB — TROPONIN I (HIGH SENSITIVITY)
Troponin I (High Sensitivity): 272 ng/L (ref <18)
Troponin I (High Sensitivity): 1185 ng/L (ref <18)

## 2019-12-04 IMAGING — US US OB COMP LESS 14 WK
1 series · 14 of 28 positions shown · non-contrast
Comparison: Obstetrical ultrasound [DATE]

CLINICAL DATA: Pregnancy, ICU patient

EXAM:
OBSTETRIC <14 WK ULTRASOUND
TECHNIQUE: Transabdominal ultrasound was performed for evaluation of the
gestation as well as the maternal uterus and adnexal regions.

[Series 1: us ob comp less 14 wk · 50 acquisitions, 14 frames shown]
[im 2/50]
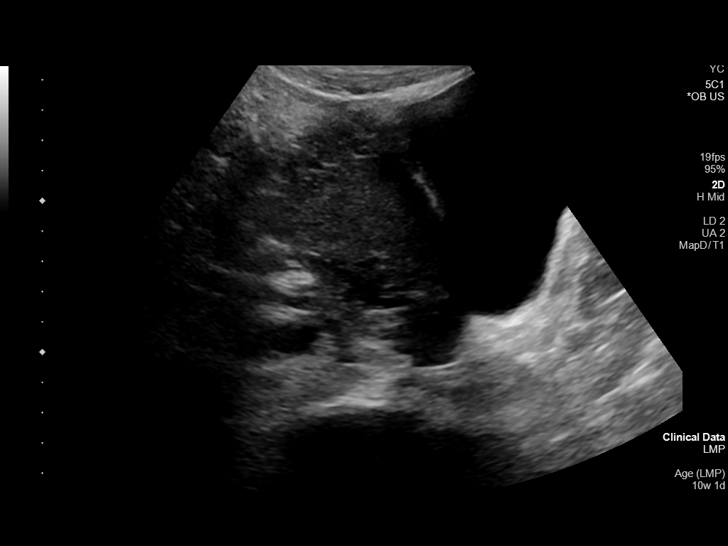
[im 6/50]
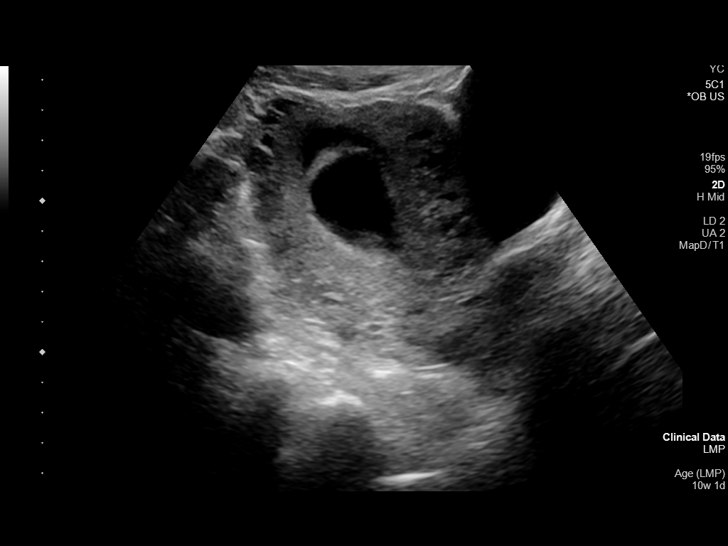
[im 10/50]
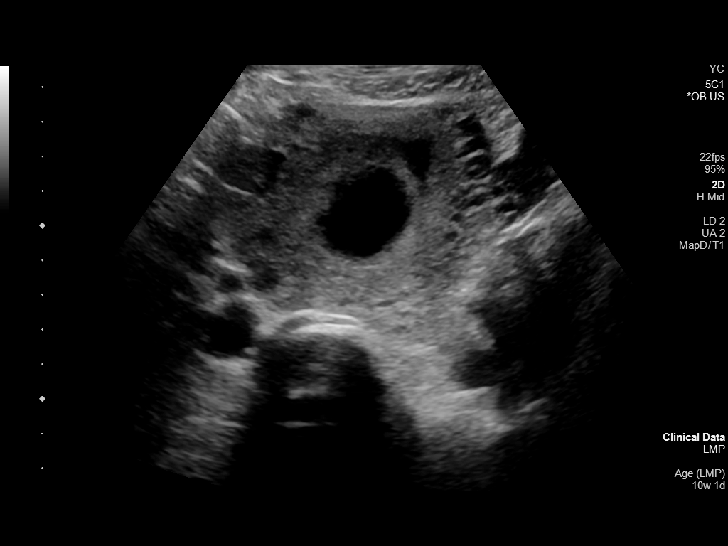
[im 13/50]
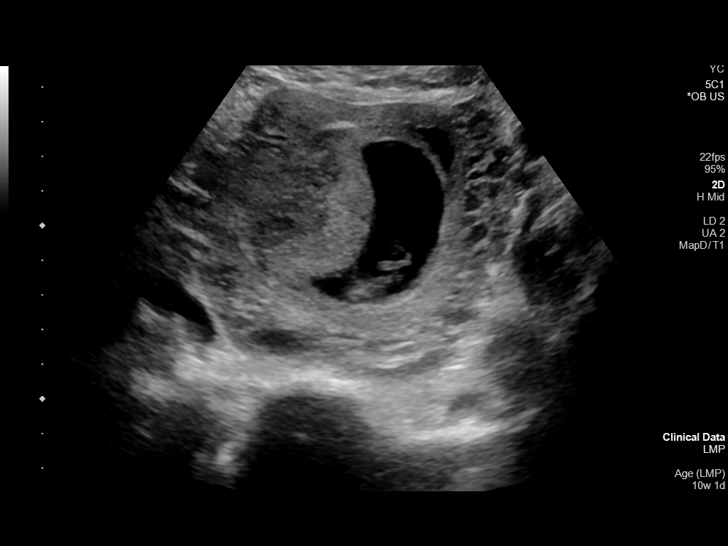
[im 17/50]
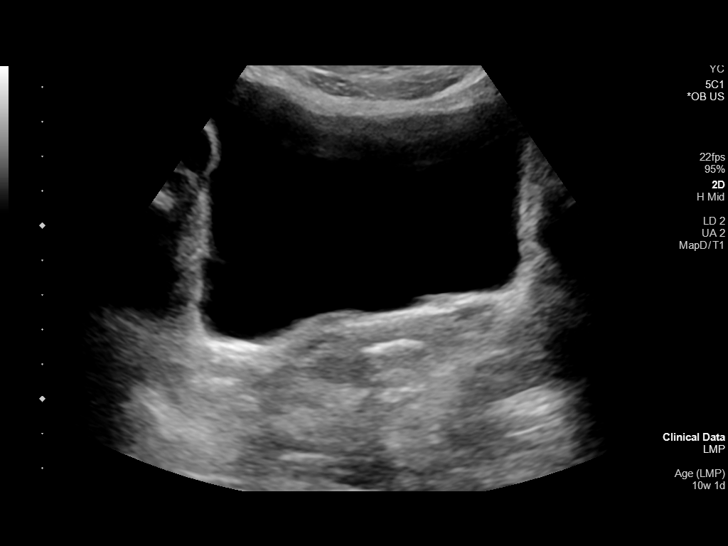
[im 20/50]
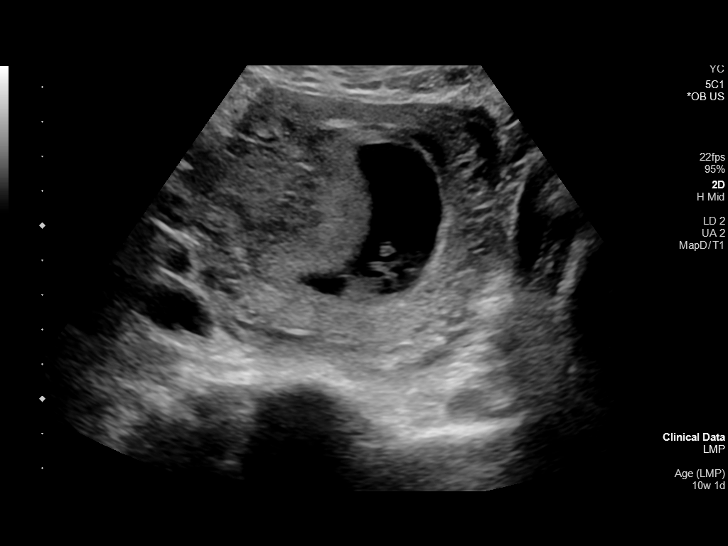
[im 24/50]
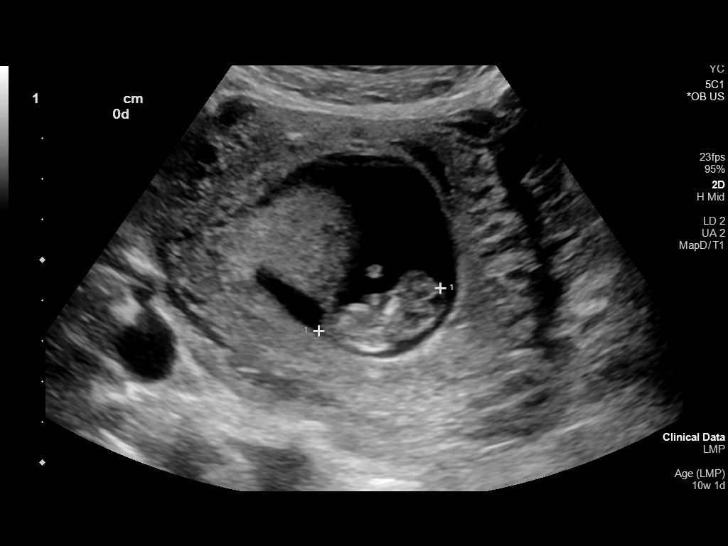
[im 28/50]
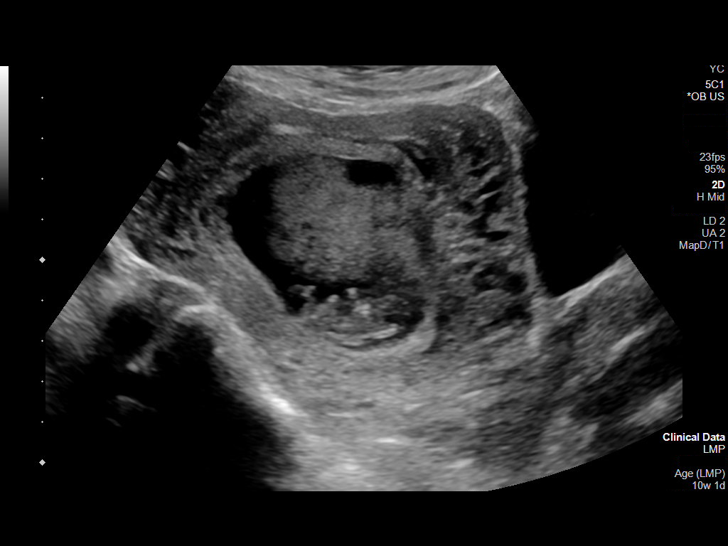
[im 31/50]
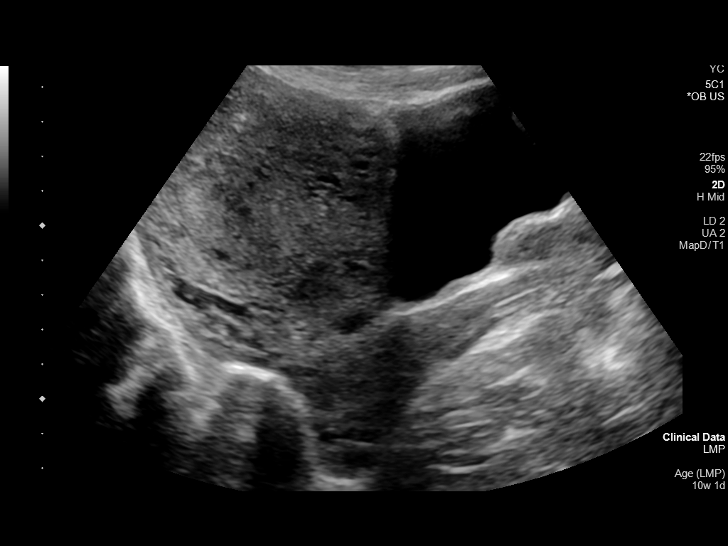
[im 35/50]
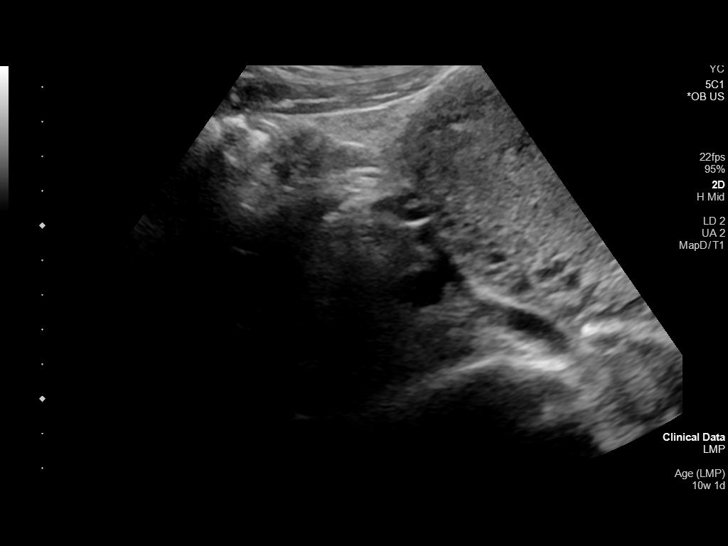
[im 39/50]
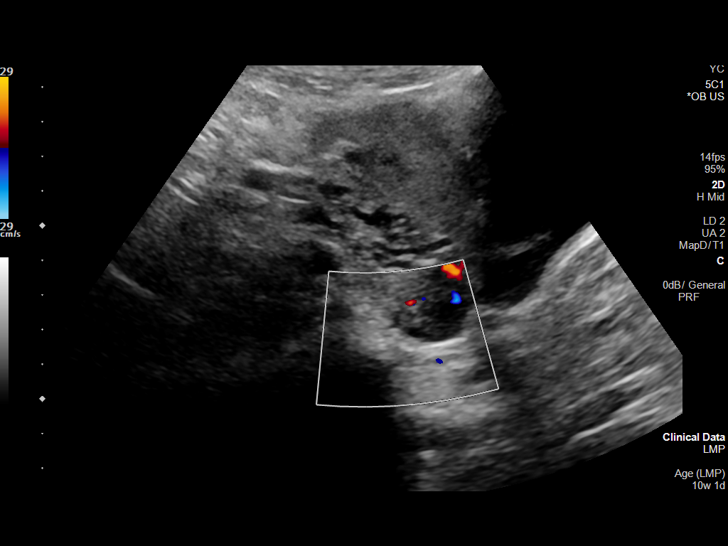
[im 42/50]
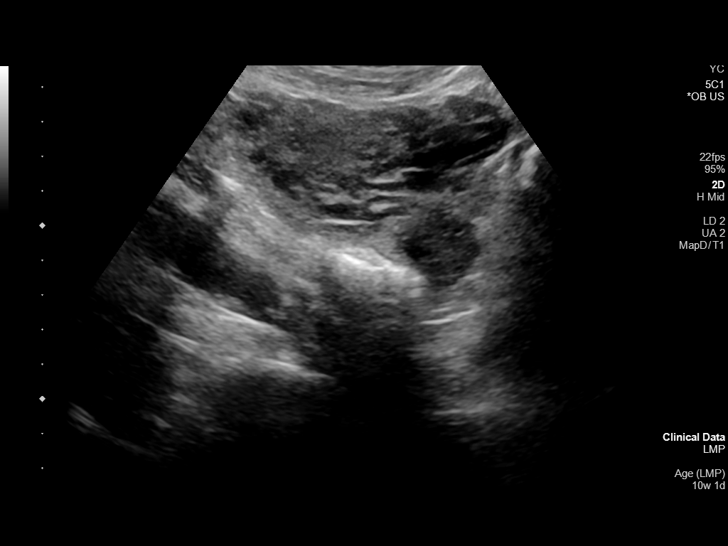
[im 46/50]
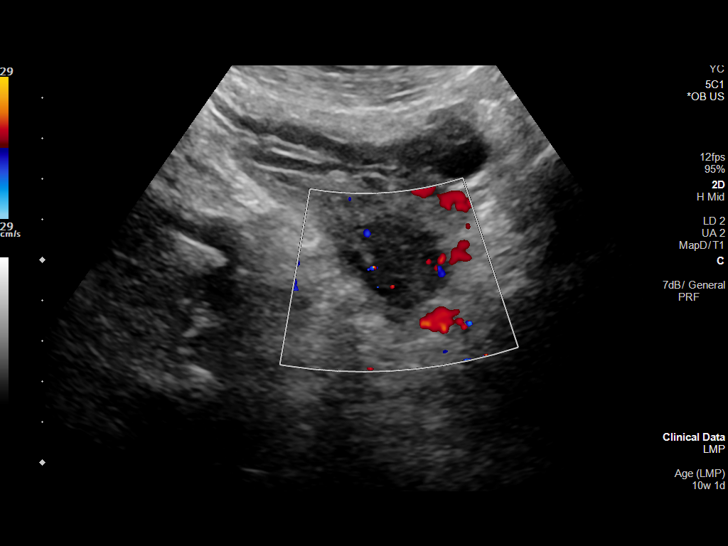
[im 50/50]
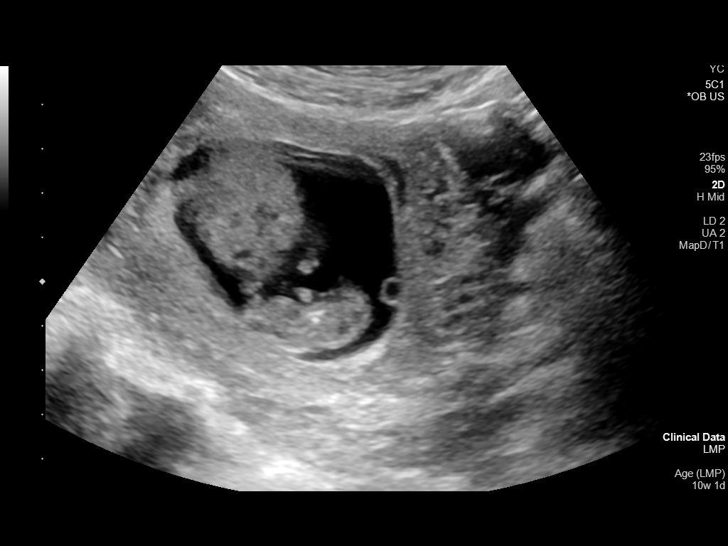

[14 of 28 positions shown; findings below may reference images not displayed]

FINDINGS: Intrauterine gestational sac: Single

Yolk sac:  Visualized.

Embryo:  Visualized.

Cardiac Activity: Visualized.

Heart Rate: 188 bpm

CRL:   31.8 mm   10 w 0 d                  US EDC: [DATE]

Subchorionic hemorrhage: Small volume mixed hypoechoic to anechoic
subchorionic hemorrhage is seen.

Maternal uterus/adnexae: Normal anteverted uterus. No other focal
uterine abnormalities. No concerning adnexal lesions. No free fluid
in the pelvis.
IMPRESSION: Single intrauterine gestation with a gestational age of 10 weeks, 0
days by crown-rump length sonographic estimation.

Small volume subchorionic hemorrhage as well as borderline fetal
tachycardia. Recommend correlation with clinical findings and
obstetrical consultation if not previously obtained.

## 2019-12-04 IMAGING — CT CT ANGIO CHEST
2 of 6 series · 18 of 36 positions shown · IV contrast (omnipaque)
Comparison: CT chest [DATE]

CLINICAL DATA: Patient is pregnant. Chest pain, pressure, no
shortness of breath or cough.

EXAM:
CT ANGIOGRAPHY CHEST WITH CONTRAST
TECHNIQUE: Multidetector CT imaging of the chest was performed using the
standard protocol during bolus administration of intravenous
contrast. Multiplanar CT image reconstructions and MIPs were
obtained to evaluate the vascular anatomy.
CONTRAST:  100mL OMNIPAQUE IOHEXOL 350 MG/ML SOLN

[Series 5: thins · axial · 0.63mm/px · z∈[+1142,+1398]mm · 17 of 288 slices shown]
[im 16/288  lung]
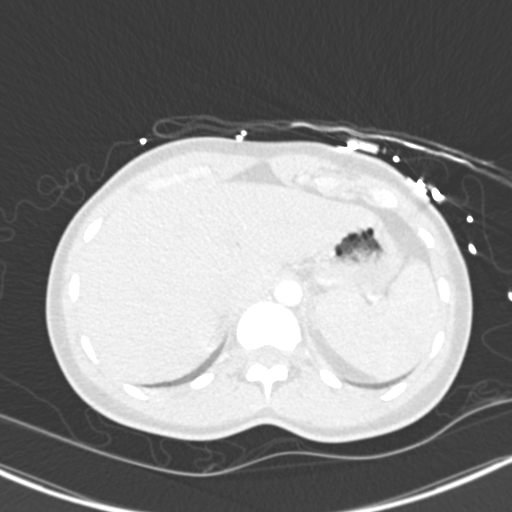
[im 32/288  mediastinal]
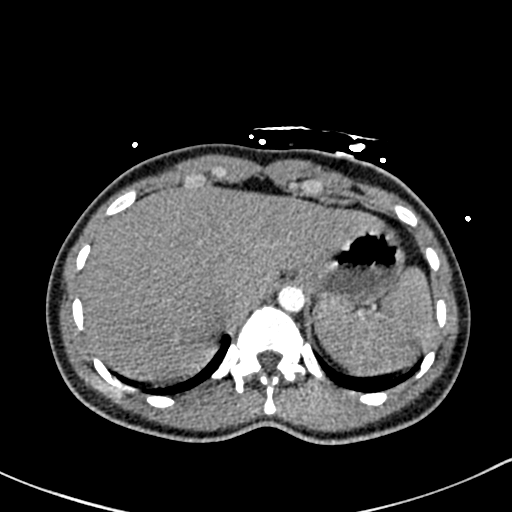
[im 48/288  lung]
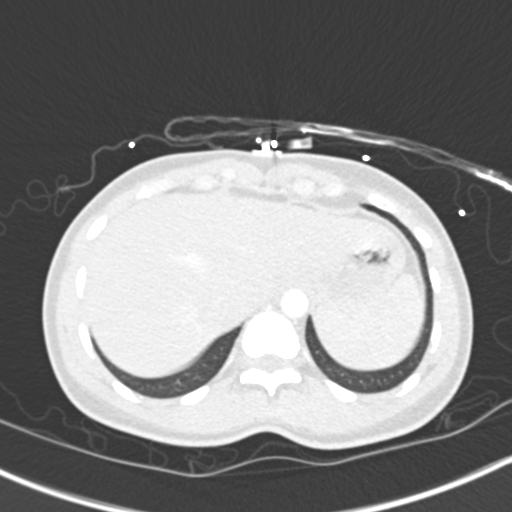
[im 64/288  mediastinal]
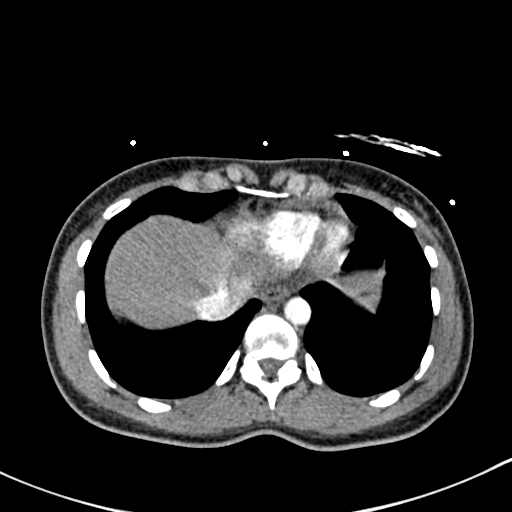
[im 80/288  lung]
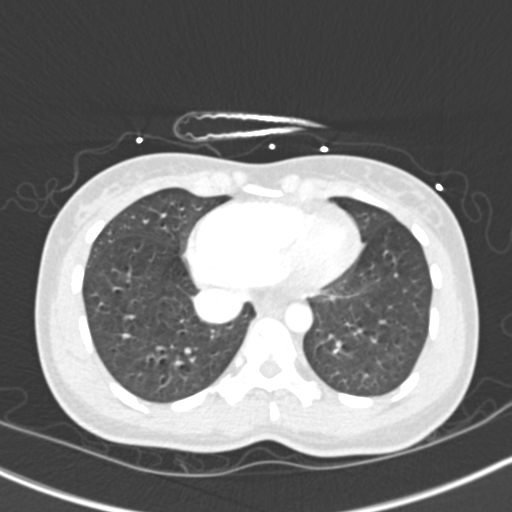
[im 96/288  mediastinal]
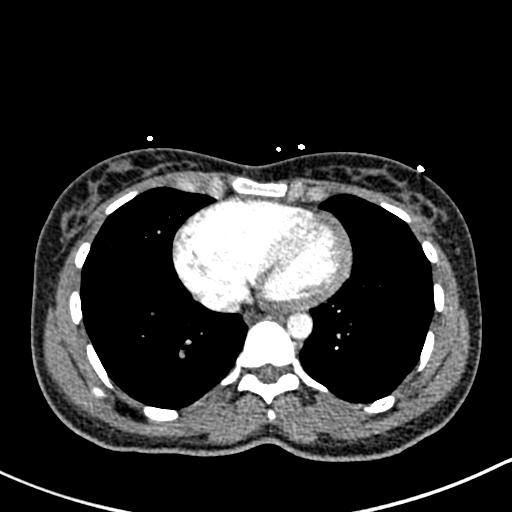
[im 112/288  lung]
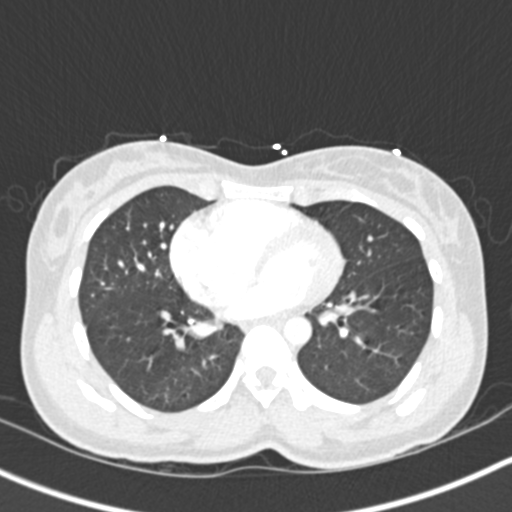
[im 128/288  mediastinal]
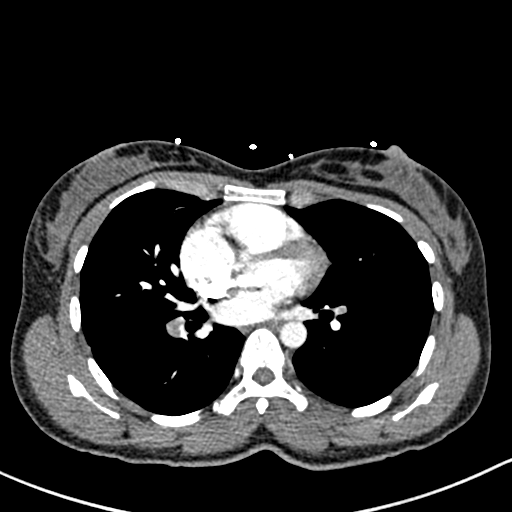
[im 144/288  lung]
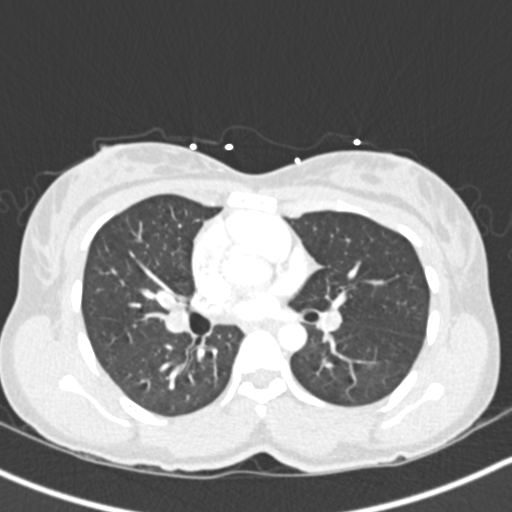
[im 160/288  mediastinal]
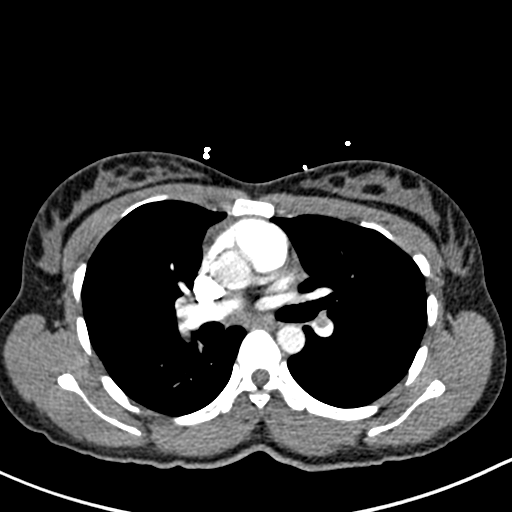
[im 176/288  lung]
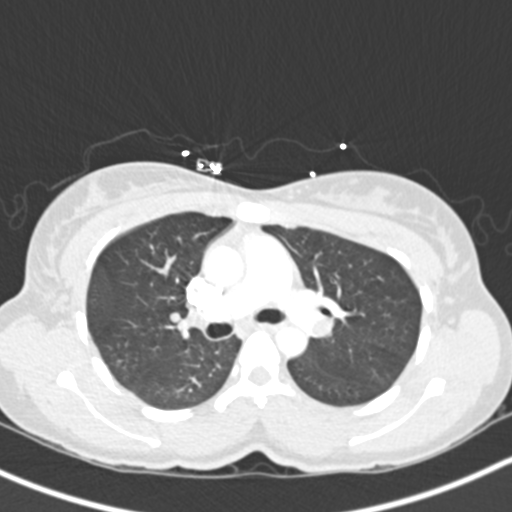
[im 192/288  mediastinal]
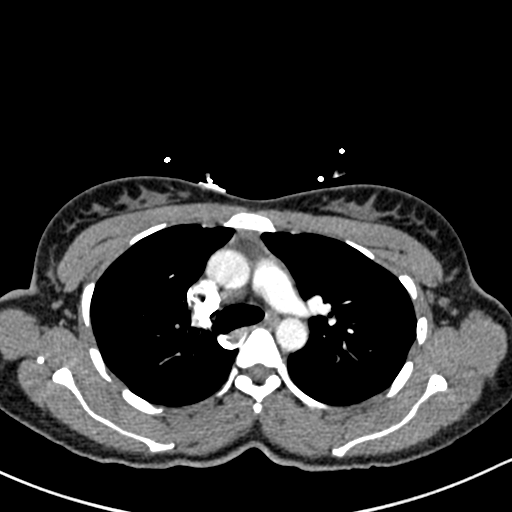
[im 208/288  lung]
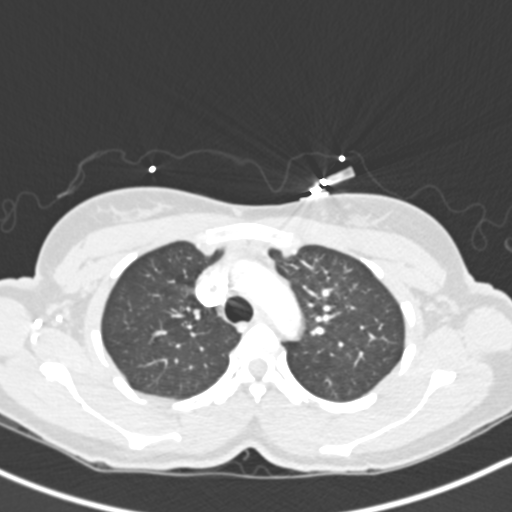
[im 224/288  mediastinal]
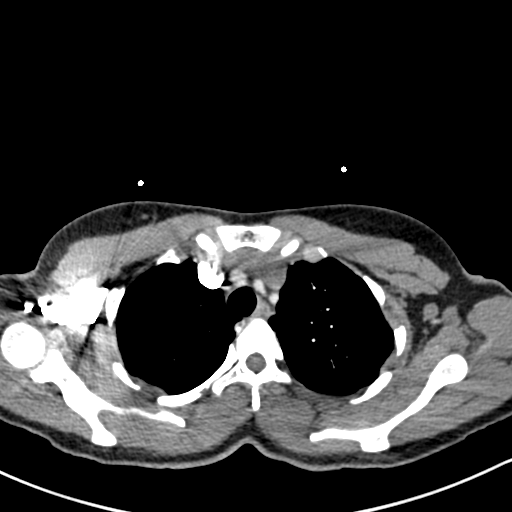
[im 240/288  lung]
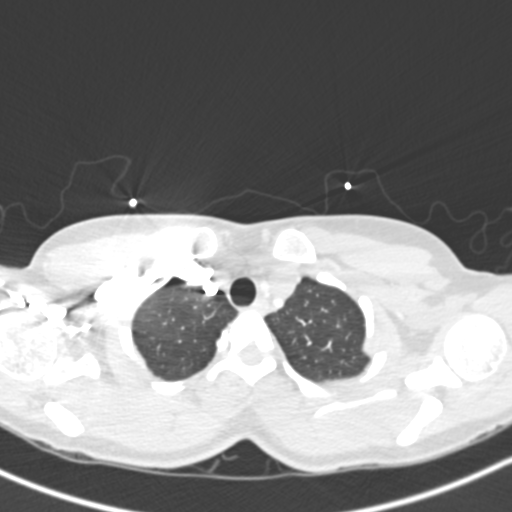
[im 256/288  mediastinal]
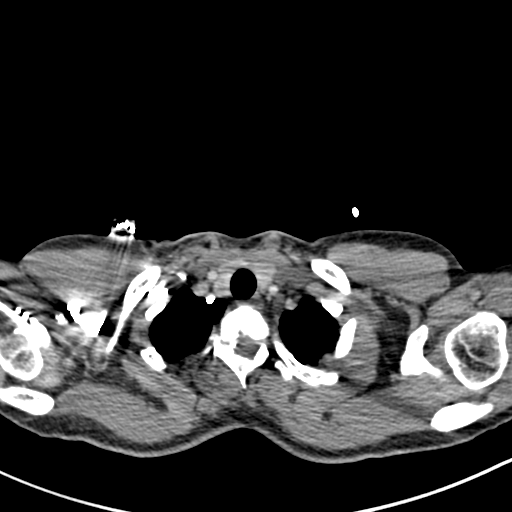
[im 272/288  lung]
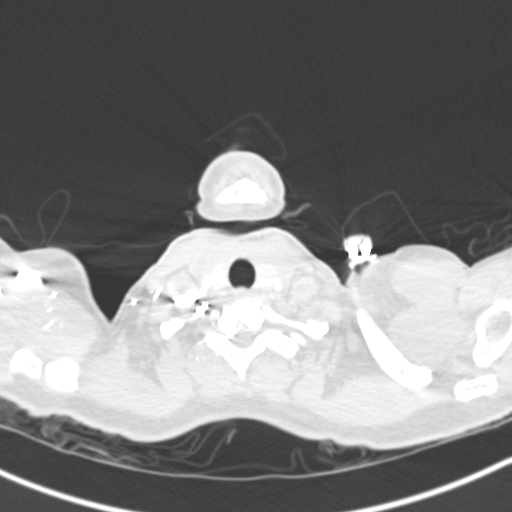

[Series 7: coronal mpr · coronal · 0.68mm/px · 1 of 110 slices shown]
[im 55/110  mediastinal]
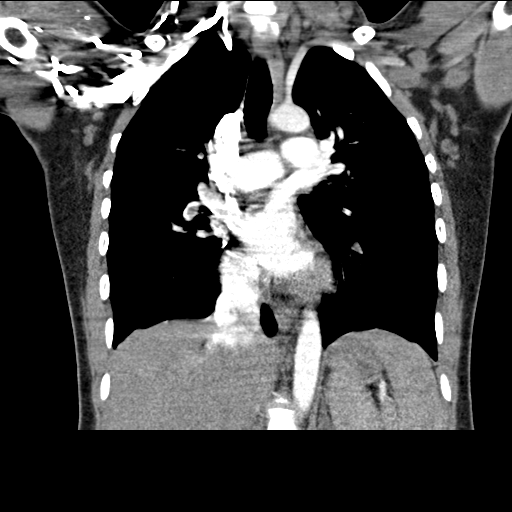

[18 of 36 positions shown; findings below may reference images not displayed]

FINDINGS: Cardiovascular: Satisfactory opacification of the pulmonary arteries
to the segmental level. There is extensive bilateral nonocclusive
and occlusive thrombus involving the right upper, right middle and
right lower lobes as well as the left lingula and left lower lobe.
Thrombus extends slightly into the left main pulmonary artery. The
RV to LV ratio is 1.4 with evidence of right heart strain. Normal
caliber of the main pulmonary artery and thoracic aorta.

Mediastinum/Nodes: No enlarged mediastinal, hilar, or axillary lymph
nodes. Thyroid gland, trachea, and esophagus demonstrate no
significant findings.

Lungs/Pleura: Lungs are clear. No pleural effusion or pneumothorax.

Upper Abdomen: No acute abnormality.

Musculoskeletal: No chest wall abnormality. No acute or significant
osseous findings.

Review of the MIP images confirms the above findings.
IMPRESSION: Extensive bilateral nonocclusive and occlusive thrombus, as detailed
above. Positive for acute PE with CT evidence of right heart strain
(RV/LV Ratio = 1.4) consistent with at least submassive
(intermediate risk) PE. The presence of right heart strain has been
associated with an increased risk of morbidity and mortality.

These results were called by telephone at the time of interpretation
on [DATE] at [DATE] to provider ARACELYS , who verbally
acknowledged these results.

## 2019-12-04 IMAGING — CR DG CHEST 2V
2 series · 2 of 2 positions shown · non-contrast
Comparison: [DATE]

CLINICAL DATA: Chest pain.

EXAM:
CHEST - 2 VIEW

[w chest lat]
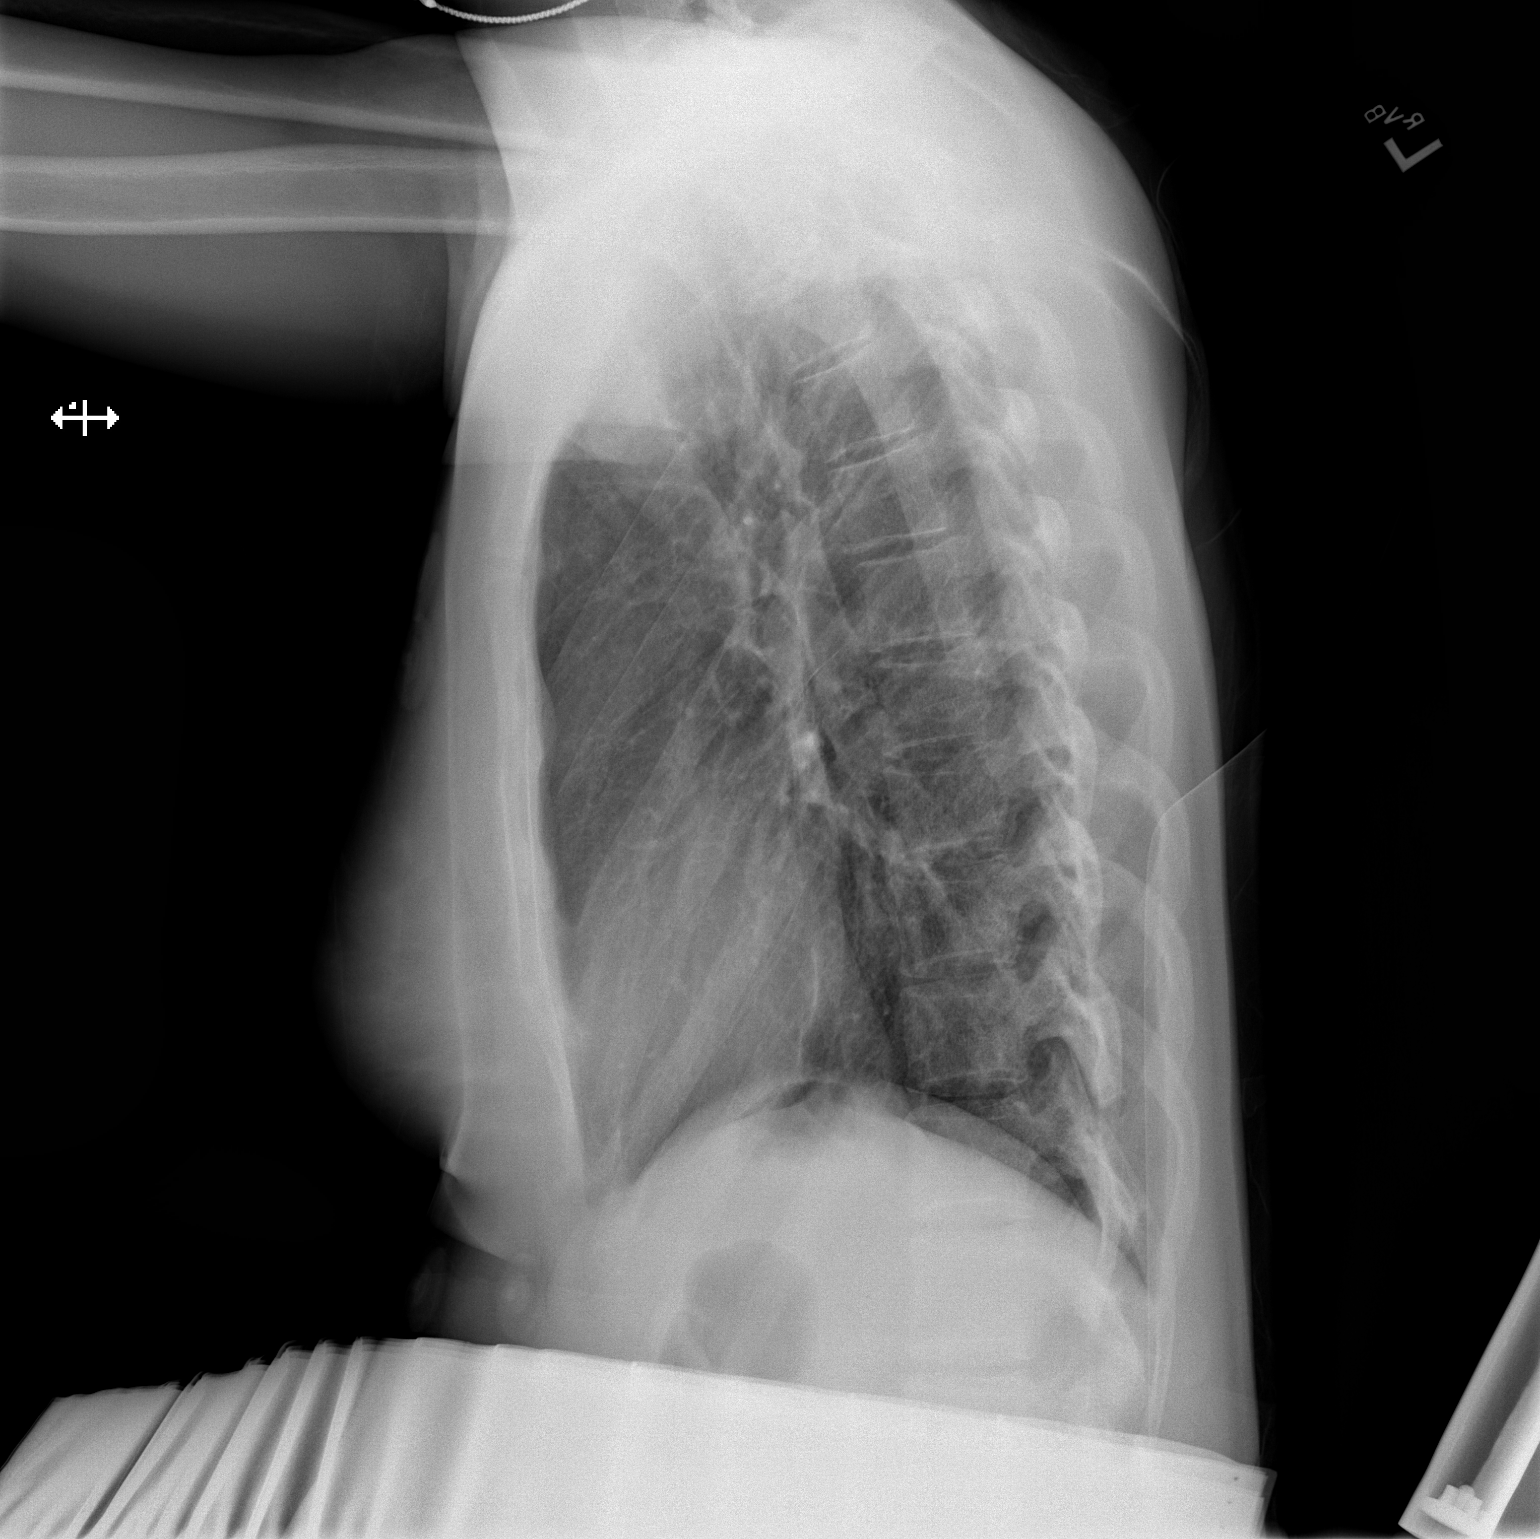

[x chest ap]
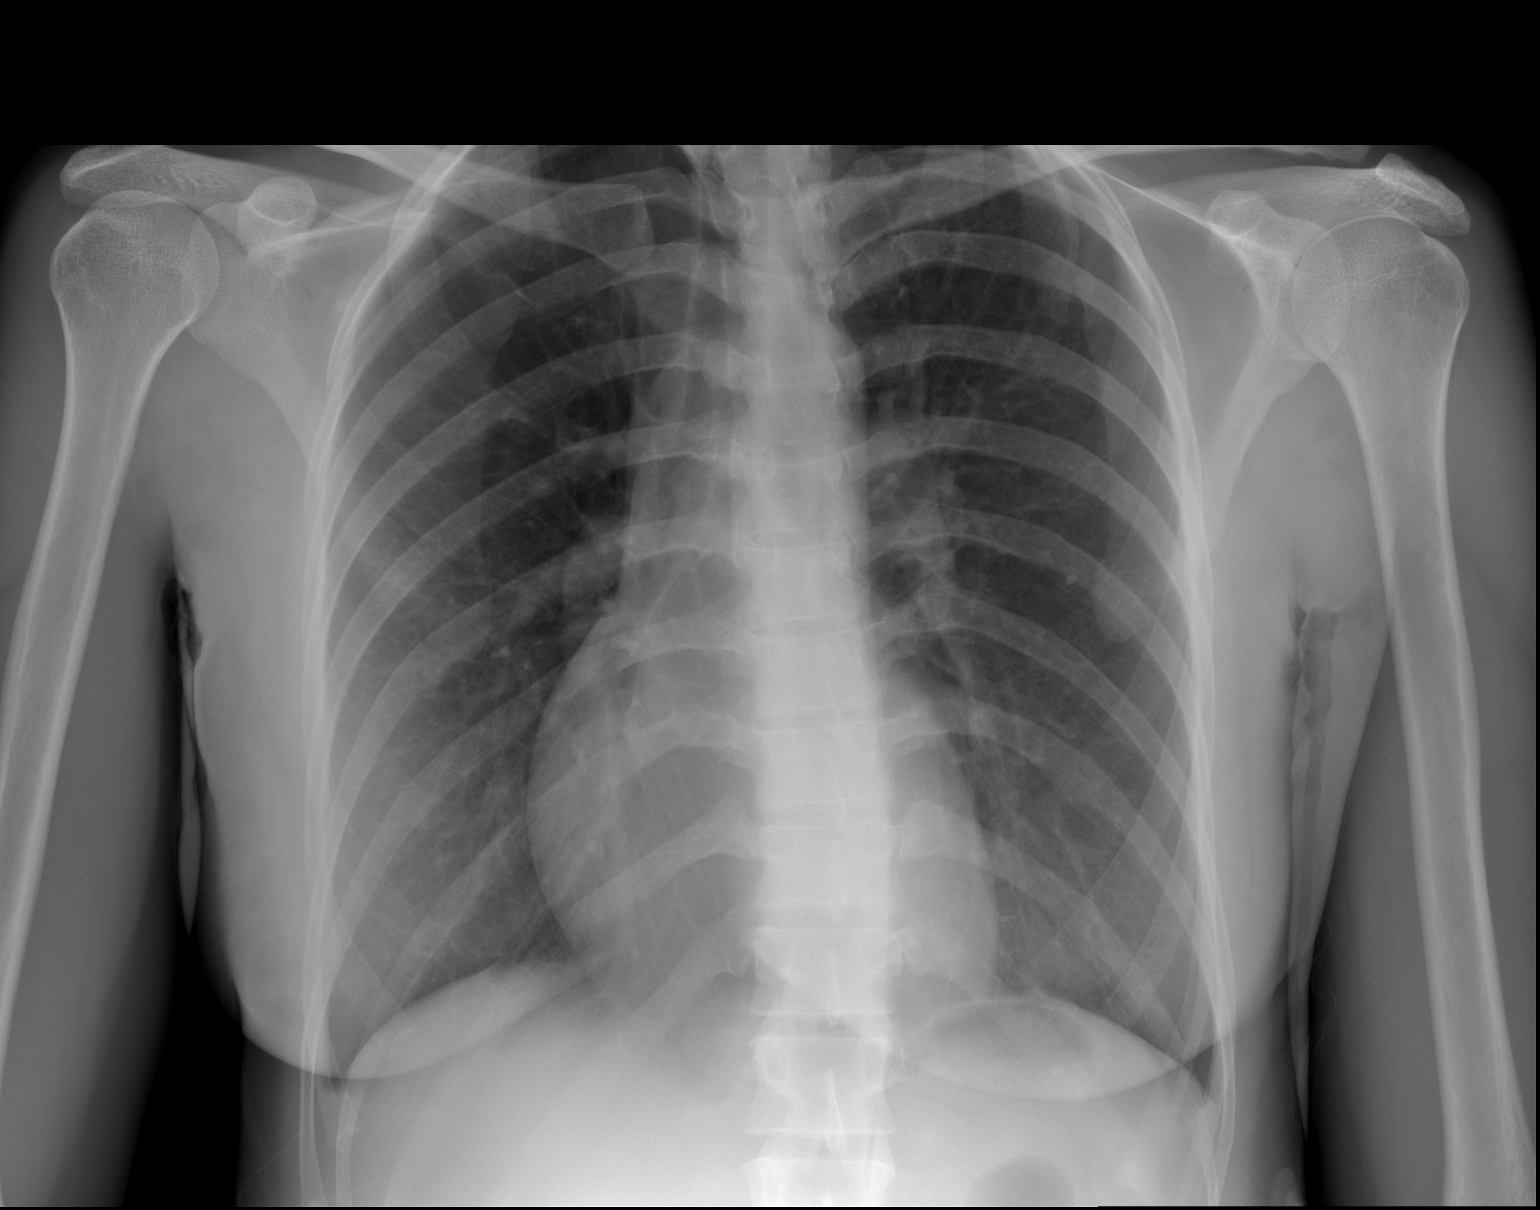

[2 of 2 positions shown; findings below may reference images not displayed]

FINDINGS: The heart size and mediastinal contours are within normal limits.
Both lungs are clear. No evidence of pneumothorax or pleural
effusion. The visualized skeletal structures are unremarkable.
IMPRESSION: Normal study.

## 2019-12-04 MED ORDER — SODIUM CHLORIDE 0.9% FLUSH
3.0000 mL | Freq: Two times a day (BID) | INTRAVENOUS | Status: DC
  Administered 2019-12-04 – 2019-12-06 (×4): 3 mL via INTRAVENOUS

## 2019-12-04 MED ORDER — IOHEXOL 350 MG/ML SOLN
100.0000 mL | Freq: Once | INTRAVENOUS | Status: AC | PRN
  Administered 2019-12-04: 100 mL via INTRAVENOUS

## 2019-12-04 MED ORDER — MORPHINE SULFATE (PF) 2 MG/ML IV SOLN
1.0000 mg | Freq: Once | INTRAVENOUS | Status: AC
  Administered 2019-12-04: 1 mg via INTRAVENOUS
  Filled 2019-12-04: qty 1

## 2019-12-04 MED ORDER — SODIUM CHLORIDE (PF) 0.9 % IJ SOLN
INTRAMUSCULAR | Status: AC
  Filled 2019-12-04: qty 50

## 2019-12-04 MED ORDER — CHLORHEXIDINE GLUCONATE CLOTH 2 % EX PADS
6.0000 | MEDICATED_PAD | Freq: Every day | CUTANEOUS | Status: DC
  Administered 2019-12-05 – 2019-12-08 (×4): 6 via TOPICAL

## 2019-12-04 MED ORDER — FAMOTIDINE IN NACL 20-0.9 MG/50ML-% IV SOLN
20.0000 mg | INTRAVENOUS | Status: DC
  Administered 2019-12-04: 20 mg via INTRAVENOUS
  Filled 2019-12-04: qty 50

## 2019-12-04 MED ORDER — MORPHINE SULFATE (PF) 2 MG/ML IV SOLN
1.0000 mg | INTRAVENOUS | Status: DC | PRN
  Administered 2019-12-04 – 2019-12-05 (×4): 1 mg via INTRAVENOUS
  Filled 2019-12-04 (×4): qty 1

## 2019-12-04 MED ORDER — ACETAMINOPHEN 325 MG PO TABS
650.0000 mg | ORAL_TABLET | Freq: Four times a day (QID) | ORAL | Status: DC | PRN
  Administered 2019-12-06: 650 mg via ORAL
  Filled 2019-12-04: qty 2

## 2019-12-04 MED ORDER — HEPARIN BOLUS VIA INFUSION
5000.0000 [IU] | Freq: Once | INTRAVENOUS | Status: AC
  Administered 2019-12-04: 5000 [IU] via INTRAVENOUS
  Filled 2019-12-04: qty 5000

## 2019-12-04 MED ORDER — ACETAMINOPHEN 650 MG RE SUPP: 650.0000 mg | Freq: Four times a day (QID) | RECTAL | Status: DC | PRN

## 2019-12-04 MED ORDER — ONDANSETRON HCL 4 MG/2ML IJ SOLN: 4.0000 mg | Freq: Four times a day (QID) | INTRAMUSCULAR | Status: DC | PRN

## 2019-12-04 MED ORDER — ONDANSETRON HCL 4 MG PO TABS
4.0000 mg | ORAL_TABLET | Freq: Four times a day (QID) | ORAL | Status: DC | PRN
  Administered 2019-12-07: 4 mg via ORAL
  Filled 2019-12-04 (×2): qty 1

## 2019-12-04 MED ORDER — ONDANSETRON HCL 4 MG/2ML IJ SOLN
4.0000 mg | Freq: Once | INTRAMUSCULAR | Status: AC
  Administered 2019-12-04: 4 mg via INTRAVENOUS
  Filled 2019-12-04: qty 2

## 2019-12-04 MED ORDER — LORATADINE 10 MG PO TABS
10.0000 mg | ORAL_TABLET | Freq: Every day | ORAL | Status: DC
  Filled 2019-12-04: qty 1

## 2019-12-04 MED ORDER — HEPARIN (PORCINE) 25000 UT/250ML-% IV SOLN
1100.0000 [IU]/h | INTRAVENOUS | Status: DC
  Administered 2019-12-04: 1100 [IU]/h via INTRAVENOUS
  Filled 2019-12-04: qty 250

## 2019-12-04 MED ORDER — LEVALBUTEROL HCL 0.63 MG/3ML IN NEBU: 0.6300 mg | INHALATION_SOLUTION | Freq: Four times a day (QID) | RESPIRATORY_TRACT | Status: DC | PRN

## 2019-12-04 MED ORDER — BISACODYL 5 MG PO TBEC: 5.0000 mg | DELAYED_RELEASE_TABLET | Freq: Every day | ORAL | Status: DC | PRN

## 2019-12-04 MED ORDER — PRENATAL MULTIVITAMIN CH
1.0000 | ORAL_TABLET | Freq: Every day | ORAL | Status: DC
  Administered 2019-12-05 – 2019-12-08 (×4): 1 via ORAL
  Filled 2019-12-04 (×6): qty 1

## 2019-12-04 MED ORDER — SODIUM CHLORIDE 0.9 % IV SOLN: INTRAVENOUS | Status: DC

## 2019-12-04 MED ORDER — ENOXAPARIN SODIUM 60 MG/0.6ML ~~LOC~~ SOLN
60.0000 mg | Freq: Two times a day (BID) | SUBCUTANEOUS | Status: DC
  Administered 2019-12-04: 60 mg via SUBCUTANEOUS
  Filled 2019-12-04: qty 0.6

## 2019-12-04 MED ORDER — SENNOSIDES-DOCUSATE SODIUM 8.6-50 MG PO TABS: 1.0000 | ORAL_TABLET | Freq: Every evening | ORAL | Status: DC | PRN

## 2019-12-04 NOTE — Consult Note (Signed)
   NAMECiella Wu, MRN:  [id number], DOB:  11/19/1990, LOS: 0 ADMISSION DATE:  12/04/2019, CONSULTATION DATE: 12/04/2019 REFERRING MD: Dr. Bernette Mayers, CHIEF COMPLAINT: Chest pain, shortness of breath  Brief History   [redacted] weeks pregnant, chest pain shortness of breath today  History of present illness   Noted to have bilateral PE   Past Medical History   Past Medical History:  Diagnosis Date  . Abnormal Pap smear 12/10/2011  . Anemia   . Anxiety 2010   anxiety attack;no meds;has not been dx'd  . Emotional instability (HCC)    Break down easily when things are not going her way  . GERD (gastroesophageal reflux disease)   . Hx of joint problems    Shoulders and knees have pain  . Infection    Yeast;would get frequently  . Infection    BV x 1  . Kidney infection 2010   PO antibxs  . Kidney stone complicating pregnancy   . Migraines    no rx'd meds in the past;goes to sleep  . PCOS (polycystic ovarian syndrome)   . Sickle cell trait (HCC)   . Sickle cell trait (HCC)   . Umbilical hernia 1991   Does not cause anyp problems  . Vaginal Pap smear, abnormal    Significant Hospital Events   10/1 hemodynamically stable  Consults:  pccm  Procedures:  None  Significant Diagnostic Tests:  CT scan of the chest-bilateral PE, right heart strain  Micro Data:  None  Antimicrobials:  None  Interim history/subjective:  She is more comfortable now, still has occasional discomfort  Objective   Blood pressure 109/82, pulse 98, temperature 97.9 F (36.6 C), temperature source Oral, resp. rate (!) 30, height 5\' 7"  (1.702 m), weight 63.5 kg, last menstrual period 09/24/2019, SpO2 96 %, unknown if currently breastfeeding.       No intake or output data in the 24 hours ending 12/04/19 1754 Filed Weights   12/04/19 1301  Weight: 63.5 kg    Examination: General: Young lady does not appear to be in distress HENT: Moist oral mucosa Lungs: Clear breath sounds  bilaterally Cardiovascular: S1-S2 appreciated Abdomen: Soft, bowel sounds appreciated Extremities: No clubbing, no edema Neuro: Alert and oriented x3 GU:   Resolved Hospital Problem list     Assessment & Plan:  Pulmonary embolism and there young lady who is [redacted] weeks pregnant -There is evidence of right heart strain -No documented hypotension -No significant tachycardia addressed -She is tachypneic  No indication for thrombolytics, will only be a candidate for thrombolytics if significant hemodynamic instability with persistent hypotension-risk of fetal loss is 5 to 6% with thrombolytics, significant risk with maternal hemorrhage with thrombolytics.  Acceptable treatment in this situation will be subcutaneous Lovenox versus subcutaneous heparin  Lovenox 1 mg/kg subcu every 12 to pregnancy up until 6 weeks postpartum  She does have sickle cell -Already a high risk pregnancy -Having PE makes it even higher  Obtain echocardiogram Obtain ultrasound of lower extremity  May be admitted to stepdown unit  Consult should be obtained with obstetrics.  02/03/20, MD  PCCM Pager: [phone]

## 2019-12-04 NOTE — Progress Notes (Signed)
eLink Physician-Brief Progress Note Patient Name: Tasha Wu DOB: 1990/07/01 MRN: [id number]   Date of Service  12/04/2019  HPI/Events of Note  24F with history of Sickle Cell Trait, currently [redacted] weeks pregnant who had onset of pleuritic chest pain and dyspnea today (as well as a syncopal episode) and subsequently presented to ED where she was found to have a submassive PE. Admitted to [facility] with PCCM consult. Seen by Dr. Wynona Neat earlier this evening (see his consult note for full details of medical decision-making).  CTA shows extensive bilateral and occlusive thrombus and e/o RH strain (RV/LV ratio = 1.4).  On exam performed via telemedicine, patient is awake and alert, oriented x3, in no respiratory distress. She is on room air saturating 98%. Her BP is 107/65. Slightly tachycardic (ST 100-107 bpm). She does endorse ongoing pleuritic chest pain (6/10). She has tylenol and small doses of morphine ordered PRN for management of this.  LE dopplers ordered and pending. Hospitalist has decided on IV heparin for first 24 hours and then transition to Lovenox thereafter if stable.   eICU Interventions  # Submassive Pulmonary Embolism: - IV heparin with weight-based bolus has been ordered by hospitalist. - Pain management with Tylenol and small doses of morphine as needed. - Continue to monitor hemodynamics closely. - Currently not requiring supplemental oxygen. - F/u Echo - F/u LE dopplers     Intervention Category Evaluation Type: New Patient Evaluation  Tasha Wu 12/04/2019, 7:31 PM

## 2019-12-04 NOTE — ED Triage Notes (Signed)
Patient c/o left chest pain and SOB that started approx 30 minutes ago. Patient states she "blacked out" and hit her face on the bed.  Patient states she is [redacted] weeks pregnant.

## 2019-12-04 NOTE — ED Notes (Signed)
Pt c/o nausea. Provider aware

## 2019-12-04 NOTE — Plan of Care (Signed)
PCCM Plan of Care Note  Seen at bedside after arrival to room. She has some slight improvement in pleuritic CP and dyspnea, she is tachycardic to 100, appears to have minimally increased work of breathing with close to normal respiratory rate on 2L O2.   - heparin gtt reasonable for now, can transition in AM to lovenox if remains stable - echo and dopplers already ordered  - would target SpO2 >95% in setting pregnancy and right heart dysfunction

## 2019-12-04 NOTE — ED Provider Notes (Signed)
[facility]-EMERGENCY DEPT Provider Note   CSN: [id number] Arrival date & time: 12/04/19  1254     History Chief Complaint  Patient presents with  . Chest Pain  . Shortness of Breath  . Loss of Consciousness    Tasha Wu is a 29 y.o. female.  HPI 29 year old G5 P3-0-1-3 currently [redacted] weeks pregnant self-reported, presents to the ER with complaints of chest pain, shortness of breath, and episode of syncope which occurred at around 11 AM.  Patient states that she was making breakfast for her other children, when she suddenly felt left-sided chest pain, shortness of breath, and sudden dizziness.  Patient states that she tried to walk over to her fianc to urged him to come help her, however passed out and did hit her jaw on the bed frame.  States she was out of consciousness for approximately 3 to 5 minutes and then regained consciousness.  She states that her husband brought her to the ER.  She currently complains of left-sided chest tightness and mild shortness of breath.  No prior history of PEs or DVTs.  Has not noticed any lower extremity swelling.  She is not on anticoagulation.  No recent surgery or travel.    Past Medical History:  Diagnosis Date  . Abnormal Pap smear 12/10/2011  . Anemia   . Anxiety 2010   anxiety attack;no meds;has not been dx'd  . Emotional instability (HCC)    Break down easily when things are not going her way  . GERD (gastroesophageal reflux disease)   . Hx of joint problems    Shoulders and knees have pain  . Infection    Yeast;would get frequently  . Infection    BV x 1  . Kidney infection 2010   PO antibxs  . Kidney stone complicating pregnancy   . Migraines    no rx'd meds in the past;goes to sleep  . PCOS (polycystic ovarian syndrome)   . Sickle cell trait (HCC)   . Sickle cell trait (HCC)   . Umbilical hernia 1991   Does not cause anyp problems  . Vaginal Pap smear, abnormal     Patient Active Problem List    Diagnosis Date Noted  . Bilateral pulmonary embolism (HCC) 12/04/2019  . SVD (spontaneous vaginal delivery) 02/21/2018  . Anemia 12/21/2017  . Pregnant 07/24/2017  . Sickle cell trait (HCC) 07/21/2017  . Anxiety 10/03/2016  . Gastroesophageal reflux disease 10/03/2016  . Normal labor 12/28/2013  . Post term pregnancy, antepartum condition or complication 06/18/2012  . Abnormal Pap smear 12/10/2011  . Umbilical hernia 11/29/2011  . H/O pyelonephritis 11/29/2011    Past Surgical History:  Procedure Laterality Date  . DILATION AND CURETTAGE OF UTERUS  2009   No complications  . WISDOM TOOTH EXTRACTION       OB History    Gravida  5   Para  3   Term  3   Preterm      AB  1   Living  3     SAB      TAB      Ectopic      Multiple  0   Live Births  3           Family History  Problem Relation Age of Onset  . Sickle cell trait Mother   . Hypertension Mother   . Anemia Mother   . Sickle cell trait Brother   . Sickle cell anemia Maternal Aunt  Recently deceased from [facility] crisis  . Diabetes Maternal Aunt   . Sickle cell anemia Maternal Grandmother        Deceased  . Heart disease Maternal Aunt   . Asthma Sister        1/2 sibling;father's child  . Hyperthyroidism Paternal Grandmother   . Scoliosis Paternal Grandmother   . Arthritis Paternal Grandmother   . Migraines Father   . Other Father        Joint Problems;shoulders and knees  . Hernia Maternal Aunt        Umbilical  . Hypertension Maternal Uncle   . Diabetes type I Cousin        maternal  . Diabetes Paternal Grandfather     Social History   Tobacco Use  . Smoking status: Former Smoker    Packs/day: 0.25    Types: Cigarettes    Quit date: 11/18/2011    Years since quitting: 8.0  . Smokeless tobacco: Never Used  Vaping Use  . Vaping Use: Never used  Substance Use Topics  . Alcohol use: Not Currently    Comment: occ  . Drug use: No    Home Medications Prior to Admission  medications   Medication Sig Start Date End Date Taking? Authorizing Provider  albuterol (VENTOLIN HFA) 108 (90 Base) MCG/ACT inhaler Inhale 2 puffs into the lungs every 6 (six) hours as needed for wheezing or shortness of breath. 12/24/18   Junie Spencer, FNP  cetirizine (ZYRTEC) 10 MG tablet Take 1 tablet (10 mg total) by mouth daily. 12/09/18   Janeece Agee, NP  Doxylamine-Pyridoxine 10-10 MG TBEC Take 10 mg by mouth at bedtime. 11/14/19   Hall-Potvin, [place], PA-C    Allergies    Patient has no known allergies.  Review of Systems   Review of Systems  Constitutional: Negative for chills and fever.  HENT: Negative for ear pain and sore throat.   Eyes: Negative for pain and visual disturbance.  Respiratory: Negative for cough and shortness of breath.   Cardiovascular: Negative for chest pain and palpitations.  Gastrointestinal: Negative for abdominal pain and vomiting.  Genitourinary: Negative for dysuria and hematuria.  Musculoskeletal: Negative for arthralgias and back pain.  Skin: Negative for color change and rash.  Neurological: Negative for seizures and syncope.  All other systems reviewed and are negative.   Physical Exam Updated Vital Signs BP 109/82   Pulse 98   Temp 97.9 F (36.6 C) (Oral)   Resp (!) 30   Ht 5\' 7"  (1.702 m)   Wt 63.5 kg   LMP 09/24/2019   SpO2 96%   BMI 21.93 kg/m   Physical Exam Vitals and nursing note reviewed.  Constitutional:      General: She is not in acute distress.    Appearance: She is well-developed.  HENT:     Head: Normocephalic and atraumatic.     Comments: No of hemotympanum, raccoon eyes, battle sign.  No mastoid tenderness.  No malocclusion.  No evidence of lacerations, cranial deformities. Full range of motion of head and neck   Eyes:     Conjunctiva/sclera: Conjunctivae normal.  Cardiovascular:     Rate and Rhythm: Normal rate and regular rhythm.     Heart sounds: No murmur heard.   Pulmonary:     Effort:  Pulmonary effort is normal. Tachypnea present. No accessory muscle usage or respiratory distress.     Breath sounds: Normal breath sounds. No decreased breath sounds or wheezing.  Abdominal:  Palpations: Abdomen is soft.     Tenderness: There is no abdominal tenderness.  Musculoskeletal:     Cervical back: Neck supple.     Right lower leg: No tenderness. No edema.     Left lower leg: No tenderness. No edema.  Skin:    General: Skin is warm and dry.  Neurological:     General: No focal deficit present.     Mental Status: She is alert.     Cranial Nerves: No cranial nerve deficit.     Motor: No weakness.     Comments: Mental Status:  Alert, thought content appropriate, able to give a coherent history. Speech fluent without evidence of aphasia. Able to follow 2 step commands without difficulty.  Cranial Nerves:  II: Peripheral visual fields grossly normal, pupils equal, round, reactive to light III,IV, VI: ptosis not present, extra-ocular motions intact bilaterally  V,VII: smile symmetric, facial light touch sensation equal VIII: hearing grossly normal to voice  X: uvula elevates symmetrically  XI: bilateral shoulder shrug symmetric and strong XII: midline tongue extension without fassiculations Motor:  Normal tone. 5/5 strength of BUE and BLE major muscle groups including strong and equal grip strength and dorsiflexion/plantar flexion Sensory: light touch normal in all extremities. Cerebellar: not accessed Gait: not accessed       ED Results / Procedures / Treatments   Labs (all labs ordered are listed, but only abnormal results are displayed) Labs Reviewed  BASIC METABOLIC PANEL - Abnormal; Notable for the following components:      Result Value   Sodium 134 (*)    Potassium 3.3 (*)    Glucose, Bld 112 (*)    All other components within normal limits  CBC - Abnormal; Notable for the following components:   HCT 34.8 (*)    MCV 79.3 (*)    All other components within  normal limits  TROPONIN I (HIGH SENSITIVITY) - Abnormal; Notable for the following components:   Troponin I (High Sensitivity) 272 (*)    All other components within normal limits  TROPONIN I (HIGH SENSITIVITY) - Abnormal; Notable for the following components:   Troponin I (High Sensitivity) 1,185 (*)    All other components within normal limits  RESPIRATORY PANEL BY RT PCR (FLU A&B, COVID)    EKG EKG Interpretation  Date/Time:  Friday December 04 2019 15:09:22 EDT Ventricular Rate:  85 PR Interval:    QRS Duration: 121 QT Interval:  360 QTC Calculation: 428 R Axis:   85 Text Interpretation: Sinus rhythm Right atrial enlargement IVCD, consider atypical RBBB No significant change since last tracing Confirmed by Susy Frizzle [phone]) on 12/04/2019 5:36:19 PM   Radiology DG Chest 2 View  Result Date: 12/04/2019 CLINICAL DATA:  Chest pain. EXAM: CHEST - 2 VIEW COMPARISON:  10/02/2012 FINDINGS: The heart size and mediastinal contours are within normal limits. Both lungs are clear. No evidence of pneumothorax or pleural effusion. The visualized skeletal structures are unremarkable. IMPRESSION: Normal study. Electronically Signed   By: Danae Orleans M.D.   On: 12/04/2019 13:30   CT Angio Chest PE W and/or Wo Contrast  Result Date: 12/04/2019 CLINICAL DATA:  Patient is pregnant. Chest pain, pressure, no shortness of breath or cough. EXAM: CT ANGIOGRAPHY CHEST WITH CONTRAST TECHNIQUE: Multidetector CT imaging of the chest was performed using the standard protocol during bolus administration of intravenous contrast. Multiplanar CT image reconstructions and MIPs were obtained to evaluate the vascular anatomy. CONTRAST:  OMNIPAQUE IOHEXOL 350 MG/ML SOLN COMPARISON:  CT chest 12/08/2017 FINDINGS: Cardiovascular: Satisfactory opacification of the pulmonary arteries to the segmental level. There is extensive bilateral nonocclusive and occlusive thrombus involving the right upper, right middle  and right lower lobes as well as the left lingula and left lower lobe. Thrombus extends slightly into the left main pulmonary artery. The RV to LV ratio is 1.4 with evidence of right heart strain. Normal caliber of the main pulmonary artery and thoracic aorta. Mediastinum/Nodes: No enlarged mediastinal, hilar, or axillary lymph nodes. Thyroid gland, trachea, and esophagus demonstrate no significant findings. Lungs/Pleura: Lungs are clear. No pleural effusion or pneumothorax. Upper Abdomen: No acute abnormality. Musculoskeletal: No chest wall abnormality. No acute or significant osseous findings. Review of the MIP images confirms the above findings. IMPRESSION: Extensive bilateral nonocclusive and occlusive thrombus, as detailed above. Positive for acute PE with CT evidence of right heart strain (RV/LV Ratio = 1.4) consistent with at least submassive (intermediate risk) PE. The presence of right heart strain has been associated with an increased risk of morbidity and mortality. These results were called by telephone at the time of interpretation on 12/04/2019 at 4:35 pm to provider [facility] , who verbally acknowledged these results. Electronically Signed   By: Emmaline Kluver M.D.   On: 12/04/2019 16:36    Procedures Procedures (including critical care time) CRITICAL CARE Performed by: Vergia Alberts   Total critical care time: 45 minutes  Critical care time was exclusive of separately billable procedures and treating other patients.  Critical care was necessary to treat or prevent imminent or life-threatening deterioration.  Critical care was time spent personally by me on the following activities: development of treatment plan with patient and/or surrogate as well as nursing, discussions with consultants, evaluation of patient's response to treatment, examination of patient, obtaining history from patient or surrogate, ordering and performing treatments and interventions, ordering and review of  laboratory studies, ordering and review of radiographic studies, pulse oximetry and re-evaluation of patient's condition.  Medications Ordered in ED Medications  sodium chloride (PF) 0.9 % injection (has no administration in time range)  enoxaparin (LOVENOX) injection 60 mg (60 mg Subcutaneous Given 12/04/19 1753)  iohexol (OMNIPAQUE) 350 MG/ML injection 100 mL (100 mLs Intravenous Contrast Given 12/04/19 1602)  ondansetron (ZOFRAN) injection 4 mg (4 mg Intravenous Given 12/04/19 1753)    ED Course  I have reviewed the triage vital signs and the nursing notes.  Pertinent labs & imaging results that were available during my care of the patient were reviewed by me and considered in my medical decision making (see chart for details).  Clinical Course as of Dec 03 1757  Fri Dec 04, 2019  1531 Troponin I (High Sensitivity)(!!): 272 [MB]  1748 Troponin I (High Sensitivity)(!!): 1,185 [MB]    Clinical Course User Index [MB] Leone Brand   MDM Rules/Calculators/A&P                          29 year old female currently nonresponder presents to the ED with complaints of shortness of breath, chest pain, no syncopal episodes Presentation, she is ill-appearing, tachypneic, however speaking in full sentences.  Lung sounds clear, lower extremities without edema or erythema.  Her blood pressure on arrival was soft with a blood pressure of 95/62, tachycardic in the low 100s.  No visible cranial deformities, evidence of maxillofacial injury, no step-offs, normal neuro exam.  Her CBC is without leukocytosis, CMP with mild hyponatremia and hypokalemia, however most notably  her troponin on arrival was 272, and after 2 hours increased to 1185.   EKG with normal sinus rhythm, right atrial enlargement, with some slight changes from first EKG at triage.  Chest X without acute abnormalities.  There is significant concern for pulmonary embolism given the patient's pregnancy.  I discussed the risk versus  benefits in terms of radiation exposure and harm to the fetus.  The patient consents to a CT PE study.  Received a call from radiology confirming that the patient has bilateral PEs with right heart strain.  Consulted Dr. Glenis Smoker w/ critical care who saw and evaluated the patient, he states that no IR intervention is indicated at this time.  Recommends Lovenox and admission to the hospitalist.  Consulted with Dr. Janee Morn with the hospitalist group, who will admit the patient for further management and treatment.  Patient was updated, voices understanding, is agreeable to this plan.  She was also seen and evaluated by Dr. Renae Gloss who is agreeable to the above plan and disposition. Final Clinical Impression(s) / ED Diagnoses Final diagnoses:  Other acute pulmonary embolism, unspecified whether acute cor pulmonale present [facility])    Rx / DC Orders ED Discharge Orders    None       Mare Ferrari, PA-C 12/04/19 1800    Pollyann Savoy, MD 12/04/19 1826

## 2019-12-04 NOTE — ED Provider Notes (Signed)
Patient seen and examined, agree with assessment and plan by APP. Patient with CP, SOB and early pregnancy with syncope. CT shows large PE. Trop is elevated.  Physical Exam  BP 102/65   Pulse (!) 101   Temp 97.9 F (36.6 C) (Oral)   Resp (!) 21   Ht 5\' 7"  (1.702 m)   Wt 63.5 kg   LMP 09/24/2019   SpO2 100%   BMI 21.93 kg/m   Physical Exam Mild tachy Resting comfortably ED Course/Procedures   Clinical Course as of Dec 04 1715  Fri Dec 04, 2019  1531 Troponin I (High Sensitivity)(!!): 272 [MB]    Clinical Course User Index [MB] Dec 06, 2019, PA-C    Procedures  MDM  Patient resting comfortably now. ICU consulted for admission and management.       Mare Ferrari, MD 12/04/19 [phone]

## 2019-12-04 NOTE — H&P (Signed)
History and Physical    Tasha Wu IRS:[id number] DOB: April 08, 1990 DOA: 12/04/2019  PCP: Janeece Agee, NP  Patient coming from:   I have personally briefly reviewed patient's old medical records in [facility]  Chief Complaint: Chest pain/shortness of breath/syncope  HPI: Tasha Wu is a 29 y.o. female with medical history significant of G5 P3-0-1-3, currently [redacted] weeks pregnant self-reported is to see OB/GYN October 6 for first visit, presented to the ED with sudden onset pleuritic left-sided chest pain, shortness of breath, syncopal episode lasting 3 to 5 minutes which occurred around 11 AM on the morning of admission.  Patient stated was making breakfast for her children when she suddenly had left-sided pleuritic chest pain, shortness of breath, dizziness.  Patient tried to walk over to her fianc for help however passed out hitting her jaw on the bed frame.  Patient subsequently regained consciousness and subsequently brought to the ED.  Patient with complaints of ongoing left-sided pleuritic chest pain, shortness of breath.  Patient denies any fevers, no chills, no abdominal pain, no diarrhea, no constipation, no melena, no hematemesis, no hematochezia.  No focal neurological deficits.  No bleeding noted.  Patient does endorse some nausea and emesis.  Stated unable to tolerate prenatal vitamins due to nausea and emesis.  Patient denies any recent surgeries, no recent long travel, no prior history of DVT or PE.  No family history of DVTs or PEs.  ED Course: Patient seen in the ED and on presentation noted to be tachypneic, tachycardic, borderline blood pressure with systolics in the mid 90s to low 100s.  Critical care was consulted who recommended admission to stepdown unit, close monitoring, Lovenox.  Lovenox ordered the ED.  2D echo lower extremity Dopplers ordered and pending.  COVID-19 PCR pending.  Review of Systems: As per HPI otherwise all other systems reviewed and are  negative.  Past Medical History:  Diagnosis Date  . Abnormal Pap smear 12/10/2011  . Anemia   . Anxiety 2010   anxiety attack;no meds;has not been dx'd  . Emotional instability (HCC)    Break down easily when things are not going her way  . GERD (gastroesophageal reflux disease)   . Hx of joint problems    Shoulders and knees have pain  . Infection    Yeast;would get frequently  . Infection    BV x 1  . Kidney infection 2010   PO antibxs  . Kidney stone complicating pregnancy   . Migraines    no rx'd meds in the past;goes to sleep  . PCOS (polycystic ovarian syndrome)   . Sickle cell trait (HCC)   . Sickle cell trait (HCC)   . Umbilical hernia 1991   Does not cause anyp problems  . Vaginal Pap smear, abnormal     Past Surgical History:  Procedure Laterality Date  . DILATION AND CURETTAGE OF UTERUS  2009   No complications  . WISDOM TOOTH EXTRACTION      Social History  reports that she quit smoking about 8 years ago. Her smoking use included cigarettes. She smoked 0.25 packs per day. She has never used smokeless tobacco. She reports previous alcohol use. She reports that she does not use drugs.  No Known Allergies  Family History  Problem Relation Age of Onset  . Sickle cell trait Mother   . Hypertension Mother   . Anemia Mother   . Sickle cell trait Brother   . Sickle cell anemia Maternal Aunt  Recently deceased from [facility] crisis  . Diabetes Maternal Aunt   . Sickle cell anemia Maternal Grandmother        Deceased  . Heart disease Maternal Aunt   . Asthma Sister        1/2 sibling;father's child  . Hyperthyroidism Paternal Grandmother   . Scoliosis Paternal Grandmother   . Arthritis Paternal Grandmother   . Migraines Father   . Other Father        Joint Problems;shoulders and knees  . Hernia Maternal Aunt        Umbilical  . Hypertension Maternal Uncle   . Diabetes type I Cousin        maternal  . Diabetes Paternal Grandfather    Mother alive  age 68 per patient with a history of cholecystectomy otherwise healthy.  Father alive with history of arthritis per patient.  Prior to Admission medications   Medication Sig Start Date End Date Taking? Authorizing Provider  albuterol (VENTOLIN HFA) 108 (90 Base) MCG/ACT inhaler Inhale 2 puffs into the lungs every 6 (six) hours as needed for wheezing or shortness of breath. 12/24/18  Yes Hawks, Christy A, FNP  Doxylamine-Pyridoxine 10-10 MG TBEC Take 10 mg by mouth at bedtime. 11/14/19  Yes Hall-Potvin, [place], PA-C  Prenatal Vit-Fe Fumarate-FA (MULTIVITAMIN-PRENATAL) 27-0.8 MG TABS tablet Take 1 tablet by mouth daily at 12 noon.   Yes [provider]  cetirizine (ZYRTEC) 10 MG tablet Take 1 tablet (10 mg total) by mouth daily. Patient not taking: Reported on 12/04/2019 12/09/18   Janeece Agee, NP    Physical Exam: Vitals:   12/04/19 1630 12/04/19 1733 12/04/19 1800 12/04/19 1830  BP: 102/65 109/82 93/76 117/83  Pulse: (!) 101 98 (!) 113 (!) 101  Resp: (!) 21 (!) 30 (!) 24 18  Temp:      TempSrc:      SpO2: 100% 96% 100% 98%  Weight:      Height:        Constitutional: NAD, calm, comfortable Vitals:   12/04/19 1630 12/04/19 1733 12/04/19 1800 12/04/19 1830  BP: 102/65 109/82 93/76 117/83  Pulse: (!) 101 98 (!) 113 (!) 101  Resp: (!) 21 (!) 30 (!) 24 18  Temp:      TempSrc:      SpO2: 100% 96% 100% 98%  Weight:      Height:       Eyes: PERRL, lids and conjunctivae normal ENMT: Mucous membranes are moist. Posterior pharynx clear of any exudate or lesions.Normal dentition.  Neck: normal, supple, no masses, no thyromegaly Respiratory: clear to auscultation bilaterally, no wheezing, no crackles.  Shallow breath sounds.  Normal respiratory effort. No accessory muscle use.  Cardiovascular: Tachycardia.  No murmurs rubs or gallops.  No JVD.  No lower extremity edema.  No carotid bruits.   Abdomen: no tenderness, no masses palpated. No hepatosplenomegaly. Bowel sounds  positive.  Musculoskeletal: no clubbing / cyanosis. No joint deformity upper and lower extremities. Good ROM, no contractures. Normal muscle tone.  Skin: no rashes, lesions, ulcers. No induration Neurologic: CN 2-12 grossly intact. Sensation intact, DTR normal. Strength 5/5 in all 4.  Psychiatric: Normal judgment and insight. Alert and oriented x 3. Normal mood.     Labs on Admission: I have personally reviewed following labs and imaging studies  CBC: Recent Labs  Lab 12/04/19 1306  WBC 6.7  HGB 12.1  HCT 34.8*  MCV 79.3*  PLT 217    Basic Metabolic Panel: Recent Labs  Lab  12/04/19 1306  NA 134*  K 3.3*  CL 103  CO2 22  GLUCOSE 112*  BUN 11  CREATININE 0.86  CALCIUM 8.9    GFR: Estimated Creatinine Clearance: 93 mL/min (by C-G formula based on SCr of 0.86 mg/dL).  Liver Function Tests: No results for input(s): AST, ALT, ALKPHOS, BILITOT, PROT, ALBUMIN in the last 168 hours.  Urine analysis:    Component Value Date/Time   COLORURINE YELLOW 08/23/2019 1044   APPEARANCEUR HAZY (A) 08/23/2019 1044   LABSPEC 1.020 08/23/2019 1044   PHURINE 5.0 08/23/2019 1044   GLUCOSEU NEGATIVE 08/23/2019 1044   HGBUR LARGE (A) 08/23/2019 1044   BILIRUBINUR NEGATIVE 08/23/2019 1044   BILIRUBINUR negative 09/24/2018 1207   BILIRUBINUR negative 11/22/2011 1142   KETONESUR NEGATIVE 08/23/2019 1044   PROTEINUR NEGATIVE 08/23/2019 1044   UROBILINOGEN 0.2 09/24/2018 1207   UROBILINOGEN 0.2 04/28/2013 1620   NITRITE NEGATIVE 08/23/2019 1044   LEUKOCYTESUR NEGATIVE 08/23/2019 1044    Radiological Exams on Admission: DG Chest 2 View  Result Date: 12/04/2019 CLINICAL DATA:  Chest pain. EXAM: CHEST - 2 VIEW COMPARISON:  10/02/2012 FINDINGS: The heart size and mediastinal contours are within normal limits. Both lungs are clear. No evidence of pneumothorax or pleural effusion. The visualized skeletal structures are unremarkable. IMPRESSION: Normal study. Electronically Signed   By: Danae Orleans M.D.   On: 12/04/2019 13:30   CT Angio Chest PE W and/or Wo Contrast  Result Date: 12/04/2019 CLINICAL DATA:  Patient is pregnant. Chest pain, pressure, no shortness of breath or cough. EXAM: CT ANGIOGRAPHY CHEST WITH CONTRAST TECHNIQUE: Multidetector CT imaging of the chest was performed using the standard protocol during bolus administration of intravenous contrast. Multiplanar CT image reconstructions and MIPs were obtained to evaluate the vascular anatomy. CONTRAST:  OMNIPAQUE IOHEXOL 350 MG/ML SOLN COMPARISON:  CT chest 12/08/2017 FINDINGS: Cardiovascular: Satisfactory opacification of the pulmonary arteries to the segmental level. There is extensive bilateral nonocclusive and occlusive thrombus involving the right upper, right middle and right lower lobes as well as the left lingula and left lower lobe. Thrombus extends slightly into the left main pulmonary artery. The RV to LV ratio is 1.4 with evidence of right heart strain. Normal caliber of the main pulmonary artery and thoracic aorta. Mediastinum/Nodes: No enlarged mediastinal, hilar, or axillary lymph nodes. Thyroid gland, trachea, and esophagus demonstrate no significant findings. Lungs/Pleura: Lungs are clear. No pleural effusion or pneumothorax. Upper Abdomen: No acute abnormality. Musculoskeletal: No chest wall abnormality. No acute or significant osseous findings. Review of the MIP images confirms the above findings. IMPRESSION: Extensive bilateral nonocclusive and occlusive thrombus, as detailed above. Positive for acute PE with CT evidence of right heart strain (RV/LV Ratio = 1.4) consistent with at least submassive (intermediate risk) PE. The presence of right heart strain has been associated with an increased risk of morbidity and mortality. These results were called by telephone at the time of interpretation on 12/04/2019 at 4:35 pm to provider [facility] , who verbally acknowledged these results. Electronically Signed   By:  Emmaline Kluver M.D.   On: 12/04/2019 16:36    EKG: Independently reviewed.  Normal sinus rhythm, right atrial enlargement, slight ST depression in leads III aVF.  Assessment/Plan Principal Problem:   Bilateral pulmonary embolism (HCC) Active Problems:   Anemia   Anxiety   Gastroesophageal reflux disease   Pregnant    1 submassive bilateral pulmonary emboli Patient presented with chest pain, shortness of breath, syncopal episode noted on CT  chest angiogram done concerning for submassive bilateral pulmonary embolism with right ventricular strain.  Questionable etiology.  Could likely be secondary to current pregnancy.  Patient with no prior history of DVT or PE.  Patient with no recent long car travel.  Patient with no recent surgeries.  Patient already received a dose of Lovenox and as such hypercoagulable panel at this time would not be useful.  Patient noted to be tachypneic on admission, some tachycardia, borderline blood pressure.  Cardiac enzymes elevated with initial set at 272 and subsequently 1185.  Patient with pleuritic chest pain.  Patient seen in consultation by PCCM and at this time no indication for thrombolytics.  Per PCCM patient will only be a candidate for thrombolytics if significant hemodynamic instability with persistent hypotension.  Risk of fetal loss is 5 to 6% with thrombolytics, significant risk with maternal hemorrhage with thrombolytics per PCCM.  2D echo ordered and pending.  Lower extremity Dopplers ordered and pending.  PCCM recommending Lovenox 1 mg/kg subcu every 12 hours up to pregnancy up until 6 weeks postpartum.  Will admit patient to the ICU for closer monitoring.  Will place on IV heparin for the next 24 hours and if patient remains stable could likely transition to full dose Lovenox.  PCCM following and appreciate input and recommendations.  2.  Hypokalemia Check a magnesium level.  Replete.  3.  High risk pregnancy Patient currently [redacted] weeks pregnant.   Patient now with bilateral pulmonary emboli.  Patient with history of sickle cell trait.  Patient high risk pregnancy.  2D echo pending.  Case discussed with OB/GYN, Dr. Mora ApplPInn who recommended viability ultrasound.  We will check a pelvic ultrasound.  OB/GYN will see the patient in the morning.  Follow.  4.  Gastroesophageal reflux disease Pepcid.  DVT prophylaxis: Heparin Code Status:   Full Family Communication:  Updated patient and fianc at bedside. Disposition Plan:   Patient is from:  Home  Anticipated DC to:  Home  Anticipated DC date:  2 to 3 days  Anticipated DC barriers: Bilateral pulmonary emboli  Consults called:  PCCM, Dr.Olalere/OB/GYN: Dr.Pinn Admission status:  Admit to inpatient  Severity of Illness:     Ramiro Harvestaniel Danyel Griess MD [facility]  How to contact the [facility] Attending or Consulting provider 7A - 7P or covering provider during after hours 7P -7A, for this patient?   1. Check the care team in [facility] and look for a) attending/consulting [facility] provider listed and b) the [facility] team listed 2. Log into [email or web address] and use [place]'s universal password to access. If you do not have the password, please contact the hospital operator. 3. Locate the [facility] provider you are looking for under [facility] and page to a number that you can be directly reached. 4. If you still have difficulty reaching the provider, please page the [facility] (Director on Call) for the Hospitalists listed on amion for assistance.  12/04/2019, 7:23 PM

## 2019-12-04 NOTE — Progress Notes (Signed)
ANTICOAGULATION CONSULT NOTE  Pharmacy Consult for IV heparin Indication: PE; pregnant  No Known Allergies  Patient Measurements: Height: 5\' 7"  (170.2 cm) Weight: 63.5 kg (140 lb) IBW/kg (Calculated) : 61.6 Heparin Dosing Weight: TBW  Vital Signs: Temp: 97.9 F (36.6 C) (10/01 1259) Temp Source: Oral (10/01 1259) BP: 117/83 (10/01 1830) Pulse Rate: 101 (10/01 1830)  Labs: Recent Labs    12/04/19 1306 12/04/19 1506  HGB 12.1  --   HCT 34.8*  --   PLT 217  --   CREATININE 0.86  --   TROPONINIHS 272* 1,185*    Estimated Creatinine Clearance: 93 mL/min (by C-G formula based on SCr of 0.86 mg/dL).   Medical History: Past Medical History:  Diagnosis Date  . Abnormal Pap smear 12/10/2011  . Anemia   . Anxiety 2010   anxiety attack;no meds;has not been dx'd  . Emotional instability (HCC)    Break down easily when things are not going her way  . GERD (gastroesophageal reflux disease)   . Hx of joint problems    Shoulders and knees have pain  . Infection    Yeast;would get frequently  . Infection    BV x 1  . Kidney infection 2010   PO antibxs  . Kidney stone complicating pregnancy   . Migraines    no rx'd meds in the past;goes to sleep  . PCOS (polycystic ovarian syndrome)   . Sickle cell trait (HCC)   . Sickle cell trait (HCC)   . Umbilical hernia 1991   Does not cause anyp problems  . Vaginal Pap smear, abnormal     Medications:  (Not in a hospital admission)  Scheduled:  . heparin  5,000 Units Intravenous Once  . loratadine  10 mg Oral Daily  . [START ON 12/05/2019] multivitamin-prenatal  1 tablet Oral Q1200  . sodium chloride (PF)      . sodium chloride flush  3 mL Intravenous Q12H   Infusions:  . sodium chloride    . heparin      Assessment: 29 yoF with PMH sickle cell trait, currently [redacted] wks pregnant, presents with sudden onset SOB and LOC earlier today. Found to have extensive bilat PE with RH strain present; troponins elevated; BNP not  drawn. Pharmacy to dose IV heparin.    Baseline INR, aPTT: not done  Prior anticoagulation: none  Significant events:  Today, 12/04/2019:  CBC: WNL  No bleeding or infusion issues per nursing  SCr WNL; at baseline  Goal of Therapy: Heparin level 0.3-0.7 units/ml Monitor platelets by anticoagulation protocol: Yes  Plan:  Heparin 5000 units IV bolus x 1  Heparin 1100 units/hr IV infusion  Check heparin level 6 hrs after start  Daily CBC, daily heparin level once stable  Monitor for signs of bleeding or thrombosis  02/03/2020, PharmD, BCPS [phone] 12/04/2019, 6:46 PM

## 2019-12-05 ENCOUNTER — Inpatient Hospital Stay (HOSPITAL_COMMUNITY): Payer: POS

## 2019-12-05 ENCOUNTER — Other Ambulatory Visit: Payer: Self-pay

## 2019-12-05 DIAGNOSIS — E876 Hypokalemia: Secondary | ICD-10-CM

## 2019-12-05 DIAGNOSIS — I2699 Other pulmonary embolism without acute cor pulmonale: Secondary | ICD-10-CM

## 2019-12-05 DIAGNOSIS — I2602 Saddle embolus of pulmonary artery with acute cor pulmonale: Secondary | ICD-10-CM

## 2019-12-05 HISTORY — PX: IR ANGIOGRAM SELECTIVE EACH ADDITIONAL VESSEL: IMG667

## 2019-12-05 HISTORY — PX: IR INFUSION THROMBOL ARTERIAL INITIAL (MS): IMG5376

## 2019-12-05 HISTORY — PX: IR US GUIDE VASC ACCESS LEFT: IMG2389

## 2019-12-05 LAB — CBC
HCT: 28.3 % — ABNORMAL LOW (ref 36.0–46.0)
HCT: 28 % — ABNORMAL LOW (ref 36.0–46.0)
Hemoglobin: 9.8 g/dL — ABNORMAL LOW (ref 12.0–15.0)
Hemoglobin: 9.8 g/dL — ABNORMAL LOW (ref 12.0–15.0)
MCH: 27.5 pg (ref 26.0–34.0)
MCH: 27.6 pg (ref 26.0–34.0)
MCHC: 34.6 g/dL (ref 30.0–36.0)
MCHC: 35 g/dL (ref 30.0–36.0)
MCV: 79.3 fL — ABNORMAL LOW (ref 80.0–100.0)
MCV: 78.9 fL — ABNORMAL LOW (ref 80.0–100.0)
Platelets: 179 10*3/uL (ref 150–400)
Platelets: 162 10*3/uL (ref 150–400)
RBC: 3.57 MIL/uL — ABNORMAL LOW (ref 3.87–5.11)
RBC: 3.55 MIL/uL — ABNORMAL LOW (ref 3.87–5.11)
RDW: 13.2 % (ref 11.5–15.5)
RDW: 13.2 % (ref 11.5–15.5)
WBC: 5.8 10*3/uL (ref 4.0–10.5)
WBC: 6.8 10*3/uL (ref 4.0–10.5)
nRBC: 0 % (ref 0.0–0.2)
nRBC: 0 % (ref 0.0–0.2)

## 2019-12-05 LAB — COMPREHENSIVE METABOLIC PANEL
ALT: 12 U/L (ref 0–44)
AST: 21 U/L (ref 15–41)
Albumin: 3.5 g/dL (ref 3.5–5.0)
Alkaline Phosphatase: 38 U/L (ref 38–126)
Anion gap: 9 (ref 5–15)
BUN: 9 mg/dL (ref 6–20)
CO2: 21 mmol/L — ABNORMAL LOW (ref 22–32)
Calcium: 8.5 mg/dL — ABNORMAL LOW (ref 8.9–10.3)
Chloride: 105 mmol/L (ref 98–111)
Creatinine, Ser: 0.8 mg/dL (ref 0.44–1.00)
GFR calc Af Amer: >60 mL/min (ref >60)
GFR calc non Af Amer: >60 mL/min (ref >60)
Glucose, Bld: 91 mg/dL (ref 70–99)
Potassium: 3.8 mmol/L (ref 3.5–5.1)
Sodium: 135 mmol/L (ref 135–145)
Total Bilirubin: 0.7 mg/dL (ref 0.3–1.2)
Total Protein: 6.4 g/dL — ABNORMAL LOW (ref 6.5–8.1)

## 2019-12-05 LAB — TROPONIN I (HIGH SENSITIVITY): Troponin I (High Sensitivity): 616 ng/L (ref <18)

## 2019-12-05 LAB — ECHOCARDIOGRAM COMPLETE
Area-P 1/2: 3.99 cm2
Height: 67 in
S' Lateral: 2.5 cm
Weight: 2208.13 oz

## 2019-12-05 LAB — URINALYSIS, COMPLETE (UACMP) WITH MICROSCOPIC
Bilirubin Urine: NEGATIVE
Glucose, UA: NEGATIVE mg/dL
Ketones, ur: 80 mg/dL — AB
Nitrite: NEGATIVE
Protein, ur: NEGATIVE mg/dL
Specific Gravity, Urine: >1.046 — ABNORMAL HIGH (ref 1.005–1.030)
pH: 5 (ref 5.0–8.0)

## 2019-12-05 LAB — BASIC METABOLIC PANEL
Anion gap: 6 (ref 5–15)
BUN: 7 mg/dL (ref 6–20)
CO2: 21 mmol/L — ABNORMAL LOW (ref 22–32)
Calcium: 8.4 mg/dL — ABNORMAL LOW (ref 8.9–10.3)
Chloride: 109 mmol/L (ref 98–111)
Creatinine, Ser: 0.76 mg/dL (ref 0.44–1.00)
GFR calc Af Amer: >60 mL/min (ref >60)
GFR calc non Af Amer: >60 mL/min (ref >60)
Glucose, Bld: 96 mg/dL (ref 70–99)
Potassium: 3.5 mmol/L (ref 3.5–5.1)
Sodium: 136 mmol/L (ref 135–145)

## 2019-12-05 LAB — POCT I-STAT 7, (LYTES, BLD GAS, ICA,H+H)
Acid-base deficit: 4 mmol/L — ABNORMAL HIGH (ref 0.0–2.0)
Bicarbonate: 18.5 mmol/L — ABNORMAL LOW (ref 20.0–28.0)
Calcium, Ion: 1.22 mmol/L (ref 1.15–1.40)
HCT: 25 % — ABNORMAL LOW (ref 36.0–46.0)
Hemoglobin: 8.5 g/dL — ABNORMAL LOW (ref 12.0–15.0)
O2 Saturation: 100 %
Patient temperature: 99.5
Potassium: 3.4 mmol/L — ABNORMAL LOW (ref 3.5–5.1)
Sodium: 136 mmol/L (ref 135–145)
TCO2: 19 mmol/L — ABNORMAL LOW (ref 22–32)
pCO2 arterial: 26.2 mmHg — ABNORMAL LOW (ref 32.0–48.0)
pH, Arterial: 7.46 — ABNORMAL HIGH (ref 7.350–7.450)
pO2, Arterial: 170 mmHg — ABNORMAL HIGH (ref 83.0–108.0)

## 2019-12-05 LAB — MAGNESIUM: Magnesium: 2.2 mg/dL (ref 1.7–2.4)

## 2019-12-05 LAB — HEMOGLOBIN AND HEMATOCRIT, BLOOD
HCT: 27.7 % — ABNORMAL LOW (ref 36.0–46.0)
Hemoglobin: 9.3 g/dL — ABNORMAL LOW (ref 12.0–15.0)

## 2019-12-05 LAB — TYPE AND SCREEN
ABO/RH(D): O POS
Antibody Screen: NEGATIVE

## 2019-12-05 LAB — HEPARIN LEVEL (UNFRACTIONATED)
Heparin Unfractionated: 1.26 IU/mL — ABNORMAL HIGH (ref 0.30–0.70)
Heparin Unfractionated: 1.06 IU/mL — ABNORMAL HIGH (ref 0.30–0.70)
Heparin Unfractionated: 0.73 IU/mL — ABNORMAL HIGH (ref 0.30–0.70)

## 2019-12-05 LAB — FIBRINOGEN: Fibrinogen: 272 mg/dL (ref 210–475)

## 2019-12-05 IMAGING — DX DG CHEST 1V PORT
1 series · 1 of 1 positions shown · non-contrast
Comparison: [DATE]

CLINICAL DATA: Chest pain

EXAM:
PORTABLE CHEST 1 VIEW

[chest ap]
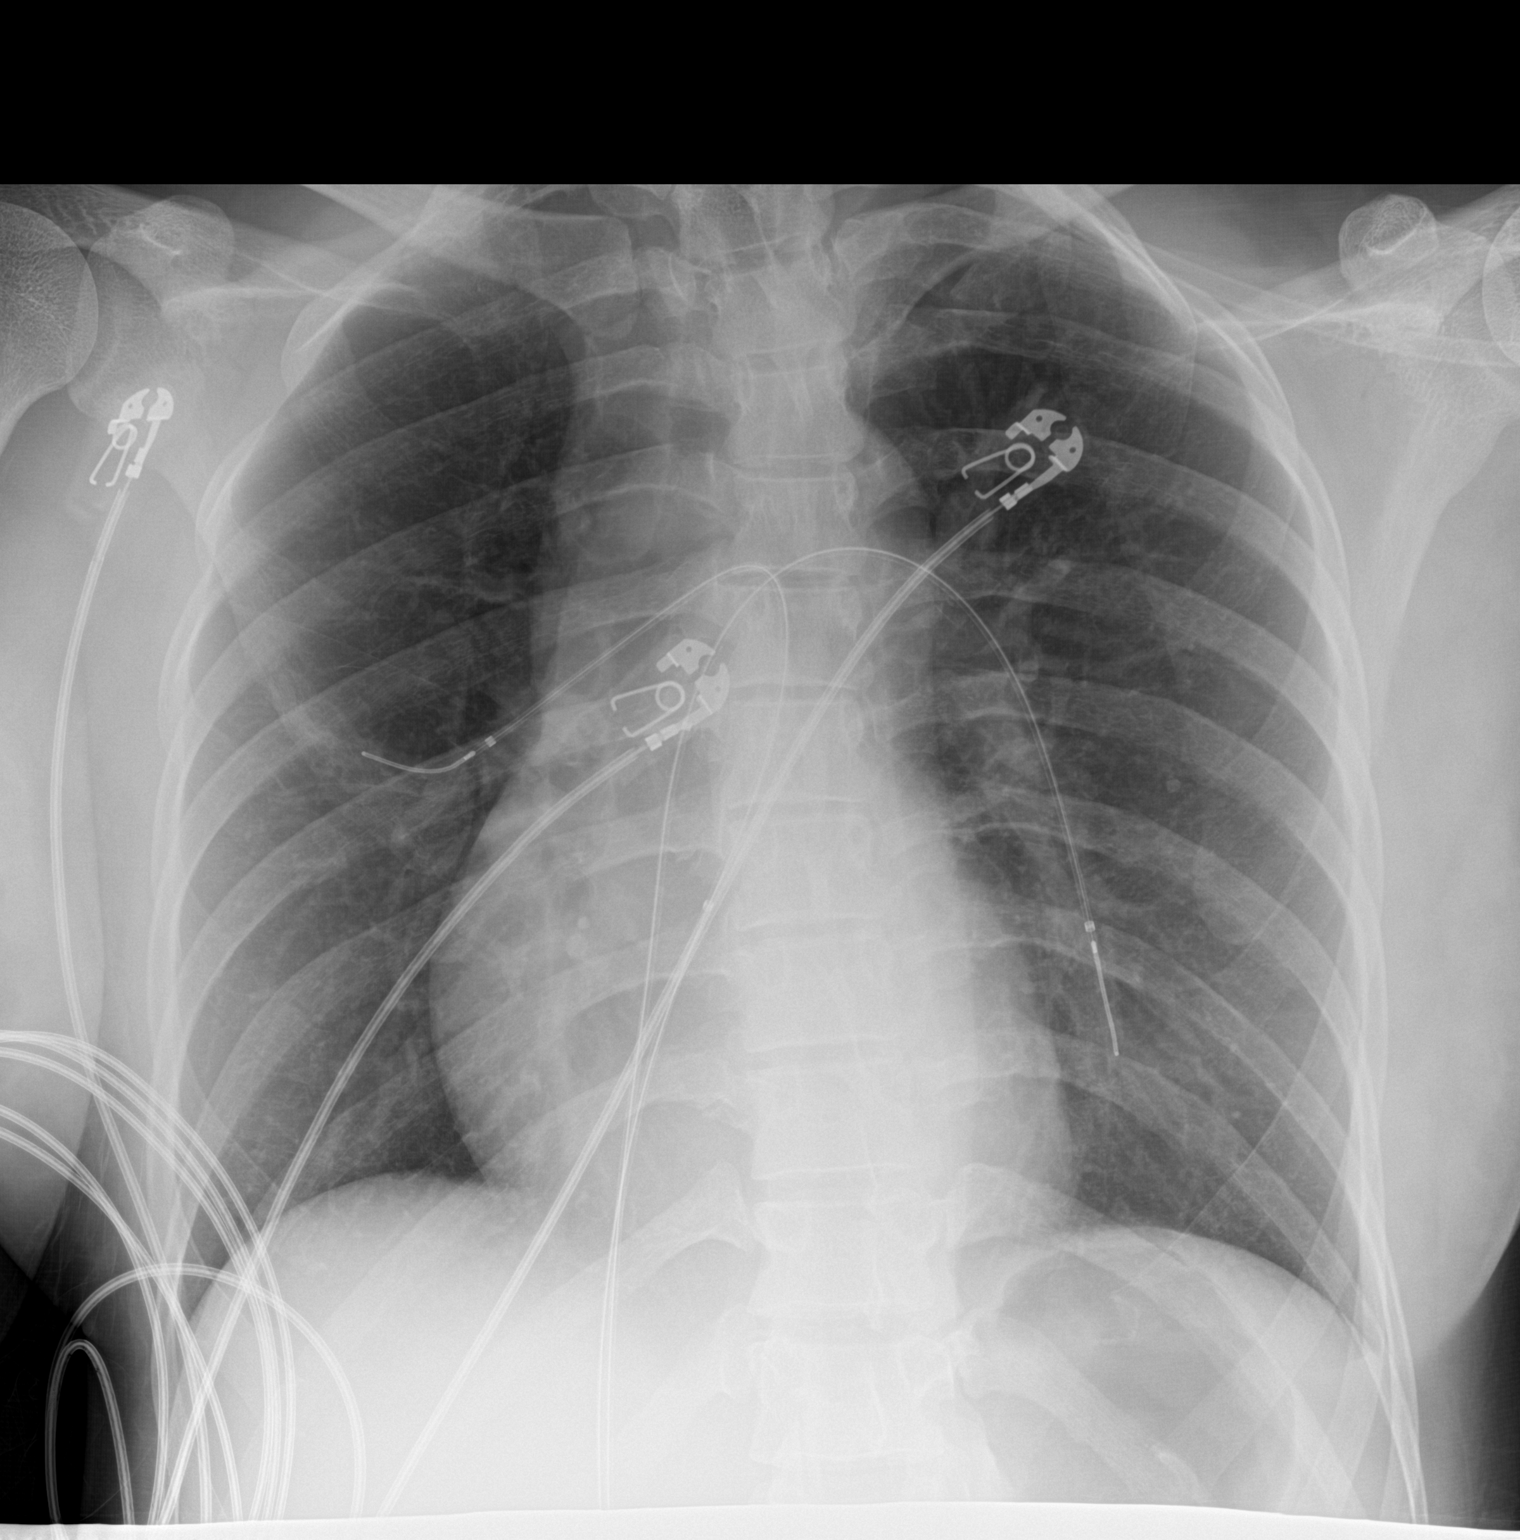

[1 of 1 positions shown; findings below may reference images not displayed]

FINDINGS: The heart size and mediastinal contours are within normal limits.
Both lungs are clear. The visualized skeletal structures are
unremarkable.
IMPRESSION: No active disease.

## 2019-12-05 IMAGING — US IR INFUSION THROMBOL ARTERIAL INITIAL (MS)
6 of 7 series · 12 of 24 positions shown · non-contrast
Comparison: CT [DATE]

INDICATION: 29-year-old female with 9 week pregnancy and submassive pulmonary
embolism. She presents for thrombo lysis

EXAM:
ULTRASOUND-GUIDED ACCESS LEFT COMMON FEMORAL VEIN X2
PULMONARY ARTERY ANGIOGRAM
PLACEMENT OF RIGHT AND LEFT LYTIC CATHETERS FOR INITIATION OF TPA
THROMBOLYSIS
TECHNIQUE: Informed consent was obtained from the patient and the patient's
family following explanation of the procedure, risks, benefits and
alternatives. Specific risks include bleeding, infection, contrast
reaction, kidney injury, venous injury, life-threatening hemorrhage
including brain hemorrhage, gastrointestinal hemorrhage, epistaxis,
need for further surgery, need for further procedure,
cardiopulmonary collapse, death. The patient understands, agrees and
consents for the procedure. All questions were addressed.

[Series 1: (id) · 1 of 1 slices shown]
[im 1/1]
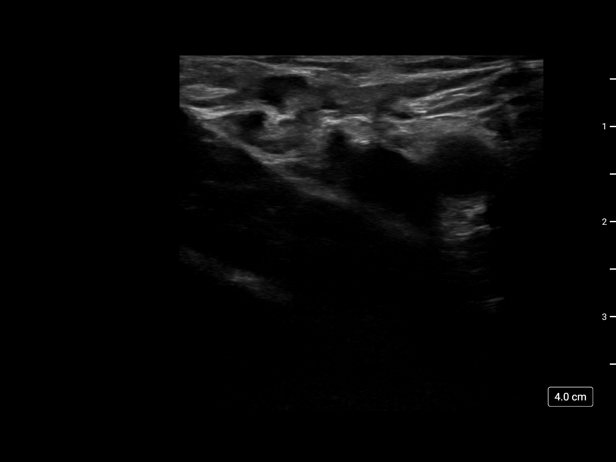

[Series 2: dsa check · 2 acquisitions, 2 frames shown (1 of 4)]
[im 1/2]
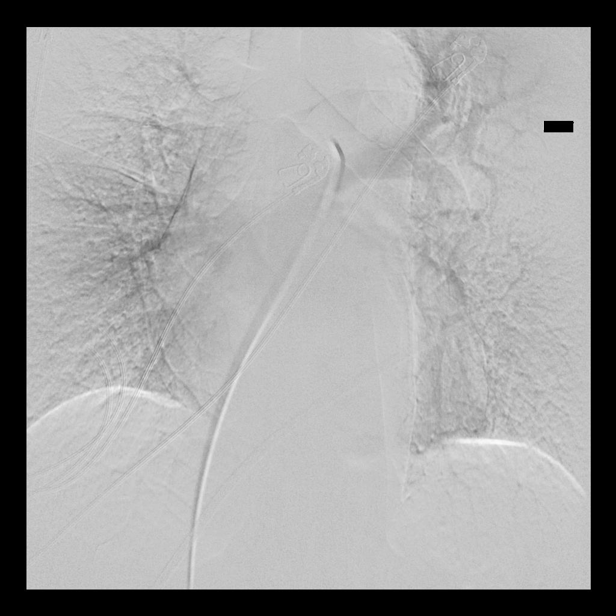
[im 2/2]
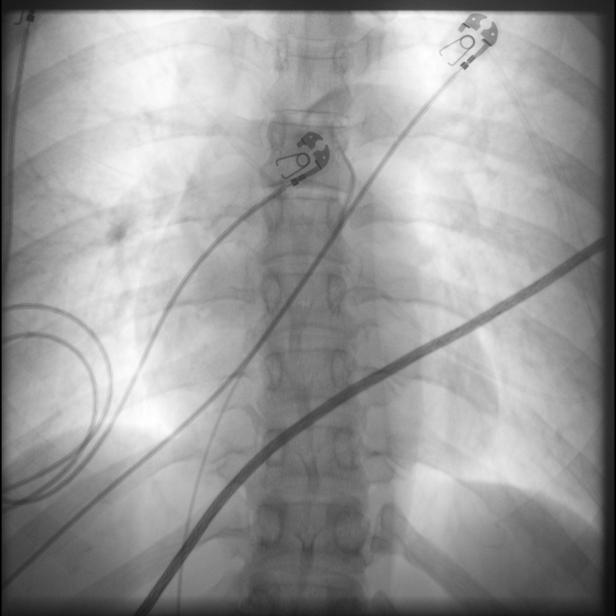

[Series 3: dsa check · 2 acquisitions, 3 frames shown (2 of 4)]
[im 1/2]
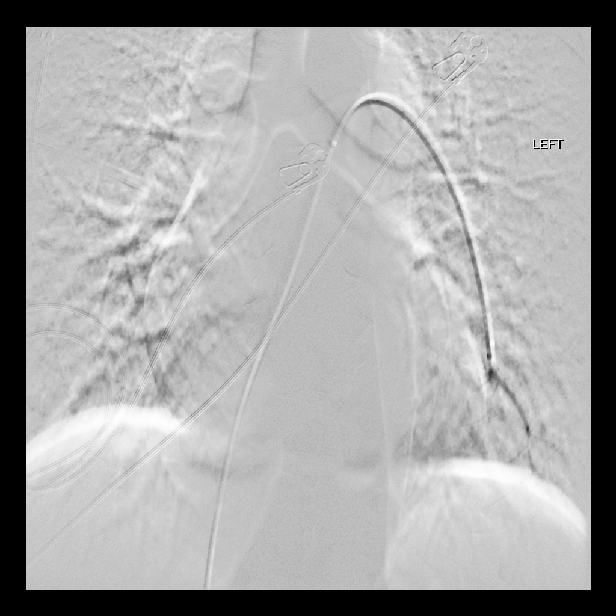
[im 2/2]
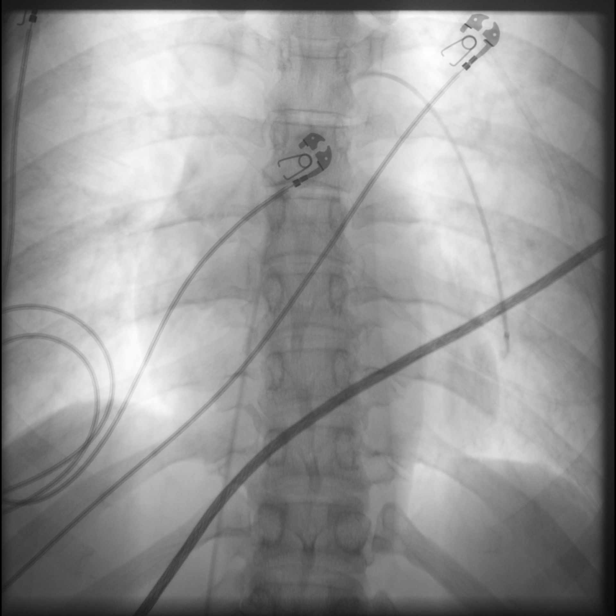
[im 2/2]
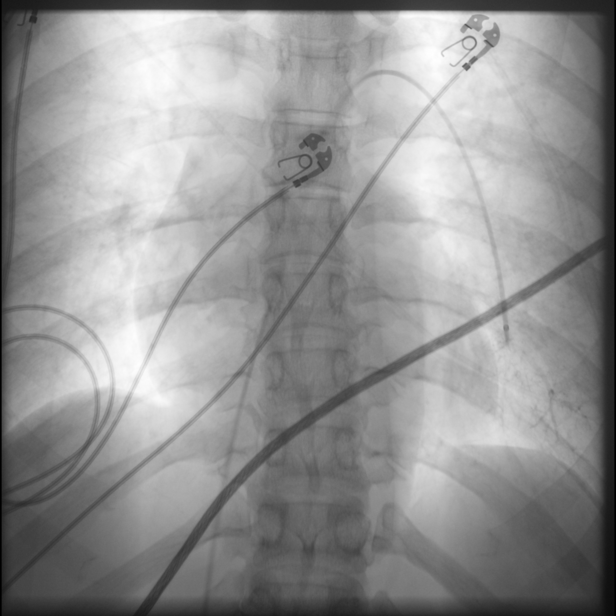

[Series 4: dsa check · 2 acquisitions, 2 frames shown (3 of 4)]
[im 1/2]
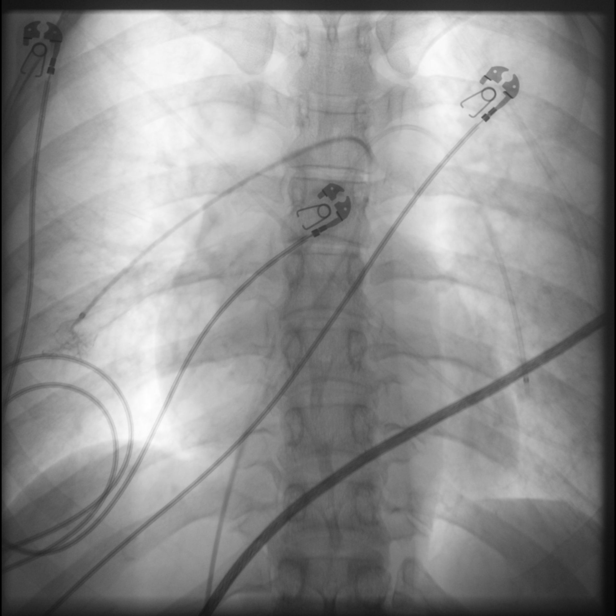
[im 2/2]
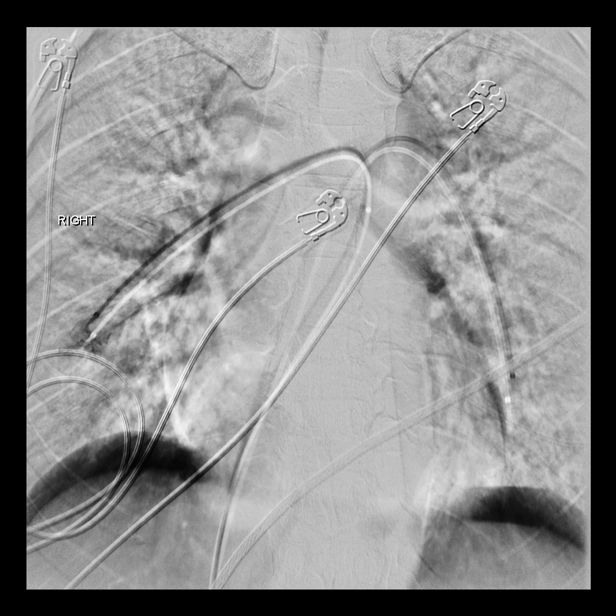

[Series 5: dsa check · 2 acquisitions, 2 frames shown (4 of 4)]
[im 1/2]
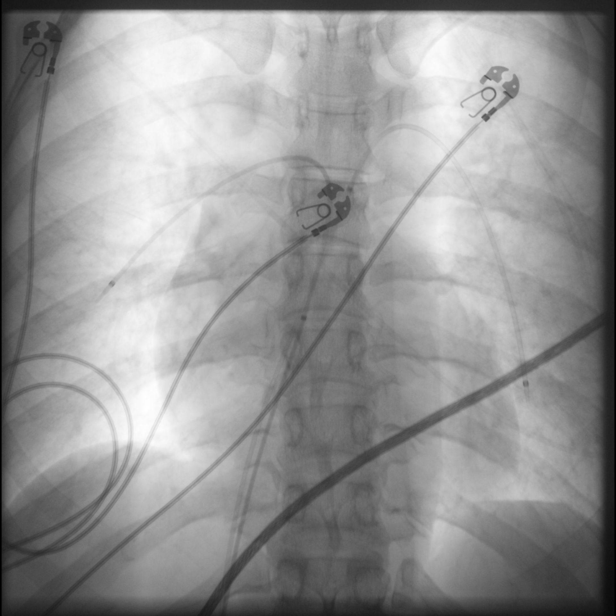
[im 2/2]
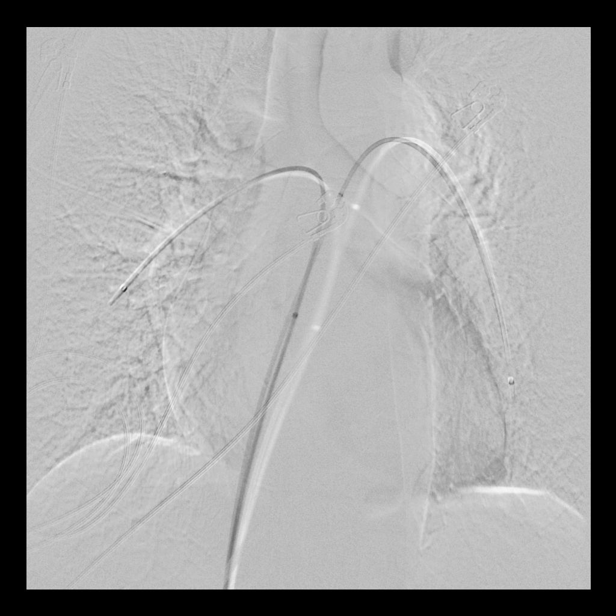

[Series 300: ir infusion thrombol arterial initial (m · arterial · 2 of 4 slices shown]
[im 1/4]
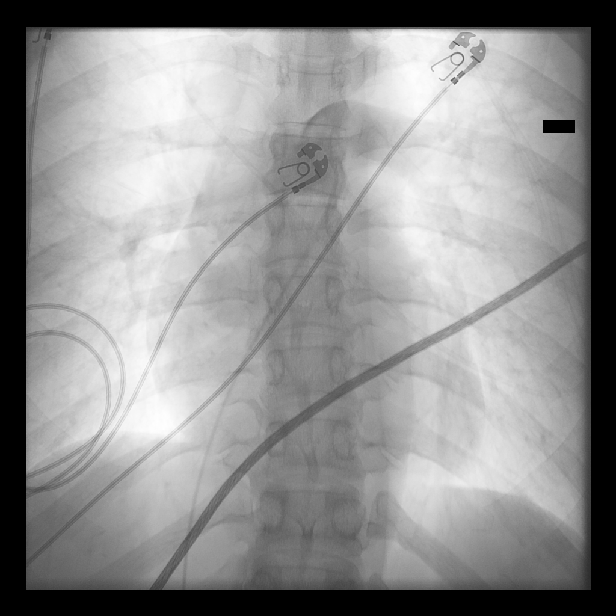
[im 4/4]
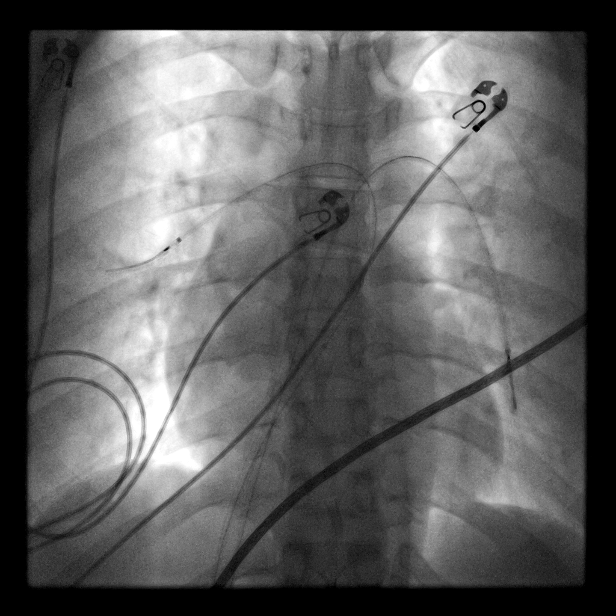

[12 of 24 positions shown; findings below may reference images not displayed]

MEDICATIONS:
None

ANESTHESIA/SEDATION:
Versed 0.5 mg IV; Fentanyl 25 mcg IV

Moderate Sedation Time:  30 minutes

The patient was continuously monitored during the procedure by the
interventional radiology nurse under my direct supervision.

FLUOROSCOPY TIME:  Fluoroscopy Time: 9 minutes 18 seconds (67 mGy).

COMPLICATIONS:
None
Patient is position supine position on the fluoroscopy table.
Maximal barrier sterile technique utilized including caps, mask,
sterile gowns, sterile gloves, large sterile drape, hand hygiene,
and betadine prep. 1% lidocaine used for local anesthesia. Moderate
sedation was provided. Radiologic shielding of the patient's
abdomen/pelvis also performed.

Ultrasound survey of the left inguinal region was performed with
images stored and sent to PACs.

A single wall needle was used access the left common femoral vein
under ultrasound. With venous blood flow returned, an 035 wire was
passed through the needle into the iliac vein. A 7 French sheath was
placed over the wire. The dilator was removed and the sheath was
flushed. Subsequently, a single wall needle was used access the left
common femoral vein adjacent to the initial puncture. With venous
blood flow returned, a 7 French vascular sheath was placed over the
wire. The dilator was removed and the sheath was flushed.

Combination of a short Kumpe the catheter and a Bentson wire was
then used to navigate through the left iliac vein and advanced to
the right atrium. Angled pigtail catheter was then used in attempt
to navigate into the pulmonary outflow.

We then exchanged the pigtail catheter for KAO catheter 100 cm.
This was used to navigate a Glidewire into the main pulmonary
artery. Once the KAO catheter was within the pulmonary artery,
pressure measurement was performed. [DATE]).

Glidewire was then advanced into the left-sided pulmonary veins,
with selection of lower lobe pulmonary vein. KAO catheter was
removed and a 15 cm infusion length, 90 cm working length UniFuse
catheter was placed. Small contrast infusion confirmed position.
Catheter was flushed.

Combination of a short Kumpe the and the Bentson wire were then
advanced through the alternative sheath. Exchange of the Kumpe the
catheter was performed for the angled pigtail catheter. Again angled
pigtail catheter was briefly used to achieve position into the
pulmonary artery outflow tract. The pigtail catheter was removed and
exchanged for a 100 cm KAO catheter. Once the KAO catheter was in
place the Glidewire and the burn were used to select the pulmonary
artery of flow, placing the wire into the right main pulmonary
artery. Once the catheter and wire were positioned into the lower
lobar branches, catheter was removed on the Glidewire. A 15 cm
infusion length UniFuse 90 cm working length catheter then placed on
the glide wire. Wire was removed, small amount of contrast confirmed
position and catheter was flushed.

The obturator wires were then placed through both the left and right
catheters.

Sheaths were secured in position.  Final image was stored.

The patient tolerated the procedure well and remained
hemodynamically stable throughout.

No complications were encountered and no significant blood loss was
encountered.
FINDINGS: Ultrasound survey demonstrates patent left common femoral vein.

Main pulmonary artery pressure measures 34/14 (25).

The leftward 7 French (red) sheath transmits the left-sided
catheter. The right KAO 7 French sheath transmits the right-sided
pulmonary artery catheter.
IMPRESSION: Status post placement of left and right pulmonary artery infusion
catheters for initiation of catheter directed thrombolysis.

PLAN:
Patient will be ICU status overnight.

Bed rest.

Both the right and left catheter will receive 1 mg tPA per hour for
12 hours, for a total dose of 24 mg over 12 hours.

Plan for repeat trans duction of catheter pressures after treatment,
reassessment, and probable removal of catheters.

EVery 6 our blood draw, including CBC, heparin level, and
fibrinogen.

## 2019-12-05 MED ORDER — SODIUM CHLORIDE 0.9% FLUSH: 10.0000 mL | INTRAVENOUS | Status: DC | PRN

## 2019-12-05 MED ORDER — SODIUM CHLORIDE 0.9% FLUSH
3.0000 mL | INTRAVENOUS | Status: DC | PRN
  Administered 2019-12-06: 3 mL via INTRAVENOUS

## 2019-12-05 MED ORDER — FENTANYL CITRATE (PF) 100 MCG/2ML IJ SOLN
INTRAMUSCULAR | Status: AC | PRN
  Administered 2019-12-05: 25 ug via INTRAVENOUS

## 2019-12-05 MED ORDER — MIDAZOLAM HCL 2 MG/2ML IJ SOLN
INTRAMUSCULAR | Status: AC
  Filled 2019-12-05: qty 2

## 2019-12-05 MED ORDER — LIDOCAINE HCL 1 % IJ SOLN
INTRAMUSCULAR | Status: AC
  Filled 2019-12-05: qty 20

## 2019-12-05 MED ORDER — ORAL CARE MOUTH RINSE
15.0000 mL | Freq: Two times a day (BID) | OROMUCOSAL | Status: DC
  Administered 2019-12-05 – 2019-12-09 (×8): 15 mL via OROMUCOSAL

## 2019-12-05 MED ORDER — IOHEXOL 300 MG/ML  SOLN
50.0000 mL | Freq: Once | INTRAMUSCULAR | Status: AC | PRN
  Administered 2019-12-05: 10 mL via INTRAVENOUS

## 2019-12-05 MED ORDER — HEPARIN (PORCINE) 25000 UT/250ML-% IV SOLN
850.0000 [IU]/h | INTRAVENOUS | Status: DC
  Administered 2019-12-06: 850 [IU]/h via INTRAVENOUS
  Filled 2019-12-05: qty 250

## 2019-12-05 MED ORDER — FAMOTIDINE 20 MG PO TABS
20.0000 mg | ORAL_TABLET | Freq: Every day | ORAL | Status: DC
  Administered 2019-12-05 – 2019-12-08 (×4): 20 mg via ORAL
  Filled 2019-12-05 (×4): qty 1

## 2019-12-05 MED ORDER — SODIUM CHLORIDE 0.9 % IV SOLN: INTRAVENOUS | Status: DC

## 2019-12-05 MED ORDER — SODIUM CHLORIDE 0.9 % IV SOLN
12.0000 mg | Freq: Once | INTRAVENOUS | Status: AC
  Administered 2019-12-05: 12 mg via INTRAVENOUS
  Filled 2019-12-05: qty 12

## 2019-12-05 MED ORDER — MIDAZOLAM HCL 2 MG/2ML IJ SOLN
INTRAMUSCULAR | Status: AC | PRN
  Administered 2019-12-05: 0.5 mg via INTRAVENOUS

## 2019-12-05 MED ORDER — SODIUM CHLORIDE 0.9% FLUSH
10.0000 mL | Freq: Two times a day (BID) | INTRAVENOUS | Status: DC
  Administered 2019-12-05: 10 mL
  Administered 2019-12-05: 30 mL
  Administered 2019-12-06 (×2): 10 mL
  Administered 2019-12-07: 30 mL

## 2019-12-05 MED ORDER — SODIUM CHLORIDE 0.9% FLUSH
3.0000 mL | Freq: Two times a day (BID) | INTRAVENOUS | Status: DC
  Administered 2019-12-05 – 2019-12-08 (×7): 3 mL via INTRAVENOUS

## 2019-12-05 MED ORDER — POTASSIUM CHLORIDE 10 MEQ/50ML IV SOLN
10.0000 meq | INTRAVENOUS | Status: AC
  Administered 2019-12-05 – 2019-12-06 (×4): 10 meq via INTRAVENOUS
  Filled 2019-12-05 (×4): qty 50

## 2019-12-05 MED ORDER — SODIUM CHLORIDE 0.9 % IV SOLN: 250.0000 mL | INTRAVENOUS | Status: DC | PRN

## 2019-12-05 MED ORDER — FENTANYL CITRATE (PF) 100 MCG/2ML IJ SOLN
INTRAMUSCULAR | Status: AC
  Filled 2019-12-05: qty 2

## 2019-12-05 MED ORDER — HEPARIN (PORCINE) 25000 UT/250ML-% IV SOLN
1100.0000 [IU]/h | INTRAVENOUS | Status: DC
  Administered 2019-12-05 (×2): 1100 [IU]/h via INTRAVENOUS
  Filled 2019-12-05: qty 250

## 2019-12-05 MED ORDER — LIDOCAINE HCL 1 % IJ SOLN
INTRAMUSCULAR | Status: AC | PRN
  Administered 2019-12-05: 10 mL via INTRADERMAL

## 2019-12-05 NOTE — Progress Notes (Signed)
eLink Physician-Brief Progress Note Patient Name: Tasha Wu DOB: 02/25/1991 MRN: [id number]   Date of Service  12/05/2019  HPI/Events of Note  Called for a BP of 82/55 which was taken shortly after the patient had stood up to urinate. She felt palpitations and had to lie back down.  20 minutes after this episode, her BP is now 93/56 with HR 80-100.   eICU Interventions  Recommend Purewick for now rather than bedside commode to minimize hemodynamic shifts.  I have spoken to Dr. Thora Lance about this patient's tenuous hemodynamics. She does not seem to tolerate even repositioning from supine to standing without becoming very symptomatic. I think at least initiating a dialogue with IR regarding catheter-directed therapy is warranted at this point. Dr. Thora Lance is in agreement and is going to come to re-assess the patient in person and take lead on this plan.     Intervention Category Major Interventions: Hypotension - evaluation and management  Marveen Reeks [place] 12/05/2019, 3:50 AM

## 2019-12-05 NOTE — Progress Notes (Signed)
PCCM Plan of Care Note  Subjective: Notified that pt became dyspneic, presyncopal, hypotensive and tachycardic getting up to use restroom. Reinforced bedrest recommendation. While medication effect from morphine may be playing role in borderline hypotension, in the context of her presentation with syncopal event, impressive clot burden/radiographic right heart strain, troponinemia will plan to transfer to [facility] where IR thrombectomy vs catheter directed lysis may be considered. [facility] ecmo support would additionally be available should she have arrest presumptively related to PE.  Assessment and Plan:  #High risk pulmonary embolism: - transfer to [facility] - case discussed with IR, they will follow - will notify Dr. Mora Appl with [facility] in AM  - heparin gtt reasonable for now - echo and dopplers already ordered  - would target SpO2 >95% in setting pregnancy and right heart dysfunction  This patient is critically ill with high risk pulmonary embolism; which, requires frequent high complexity decision making, assessment, support, evaluation, and titration of therapies. This was completed through the application of advanced monitoring technologies and extensive interpretation of multiple databases. During this encounter critical care time was devoted to patient care services described in this note for 20 minutes.

## 2019-12-05 NOTE — Progress Notes (Signed)
ANTICOAGULATION CONSULT NOTE  Pharmacy Consult for IV heparin Indication: PE; pregnant  No Known Allergies  Patient Measurements: Height: 5\' 7"  (170.2 cm) Weight: 62.6 kg (138 lb 0.1 oz) IBW/kg (Calculated) : 61.6 Heparin Dosing Weight: TBW  Vital Signs: Temp: 98.1 F (36.7 C) (10/02 0900) Temp Source: Oral (10/02 0451) BP: 81/59 (10/02 0900) Pulse Rate: 95 (10/02 0900)  Labs: Recent Labs    12/04/19 1306 12/04/19 1506 12/05/19 0039 12/05/19 0042 12/05/19 0738 12/05/19 1042  HGB 12.1  --   --   --   --   --   HCT 34.8*  --   --   --   --   --   PLT 217  --   --   --   --   --   HEPARINUNFRC  --   --  1.26*  --   --  1.06*  CREATININE 0.86  --   --  0.80  --   --   TROPONINIHS 272* 1,185*  --   --  616*  --     Estimated Creatinine Clearance: 100 mL/min (by C-G formula based on SCr of 0.8 mg/dL).   Assessment: 25 yoF with PMH sickle cell trait, currently [redacted] wks pregnant, presents with sudden onset SOB and LOC earlier today. Found to have extensive bilat PE with RH strain present; troponins elevated; BNP not drawn. Pharmacy to dose IV heparin.    Baseline INR, aPTT: not done  Prior anticoagulation: none  Significant events:  Today, 12/05/2019:  Heparin level = 1.06 (supratherapeutic) after heparin drip held until 05 am and resumed at @ 1100 units/hr  CBC ordered for 11 am pending  Heparin level drawn from L arm and heparin infusing via R arm  No bleeding or infusion issues per nursing  Prior to heparin starting, patient had received Enoxaparin 60mg  sq x 1 in ED @ 17:53  Pt to transfer to [facility] for catheter directed thrombolysis by interventional radiology  Goal of Therapy: Heparin level 0.3-0.7 units/ml Monitor platelets by anticoagulation protocol: Yes  Plan:  Decrease heparin to 950 units/hr  Check heparin level 6 hrs after rate change  Daily CBC, daily heparin level once stable  Monitor for signs of bleeding or thrombosis  ,  Pharm.D 12/05/2019 12:13 PM

## 2019-12-05 NOTE — Progress Notes (Signed)
ANTICOAGULATION CONSULT NOTE  Pharmacy Consult for IV heparin Indication: PE; pregnant  No Known Allergies  Patient Measurements: Height: 5\' 7"  (170.2 cm) Weight: 63.5 kg (140 lb) IBW/kg (Calculated) : 61.6 Heparin Dosing Weight: TBW  Vital Signs: Temp: 98 F (36.7 C) (10/02 0027) Temp Source: Oral (10/02 0027) BP: 82/55 (10/02 0312) Pulse Rate: 84 (10/02 0312)  Labs: Recent Labs    12/04/19 1306 12/04/19 1506 12/05/19 0039 12/05/19 0042  HGB 12.1  --   --   --   HCT 34.8*  --   --   --   PLT 217  --   --   --   HEPARINUNFRC  --   --  1.26*  --   CREATININE 0.86  --   --  0.80  TROPONINIHS 272* 1,185*  --   --     Estimated Creatinine Clearance: 100 mL/min (by C-G formula based on SCr of 0.8 mg/dL).   Medical History: Past Medical History:  Diagnosis Date  . Abnormal Pap smear 12/10/2011  . Anemia   . Anxiety 2010   anxiety attack;no meds;has not been dx'd  . Emotional instability (HCC)    Break down easily when things are not going her way  . GERD (gastroesophageal reflux disease)   . Hx of joint problems    Shoulders and knees have pain  . Infection    Yeast;would get frequently  . Infection    BV x 1  . Kidney infection 2010   PO antibxs  . Kidney stone complicating pregnancy   . Migraines    no rx'd meds in the past;goes to sleep  . PCOS (polycystic ovarian syndrome)   . Sickle cell trait (HCC)   . Sickle cell trait (HCC)   . Umbilical hernia 1991   Does not cause anyp problems  . Vaginal Pap smear, abnormal     Medications:  Medications Prior to Admission  Medication Sig Dispense Refill Last Dose  . albuterol (VENTOLIN HFA) 108 (90 Base) MCG/ACT inhaler Inhale 2 puffs into the lungs every 6 (six) hours as needed for wheezing or shortness of breath. 8 g 0 unknown  . Doxylamine-Pyridoxine 10-10 MG TBEC Take 10 mg by mouth at bedtime. 60 tablet 0 Past Week at Unknown time  . Prenatal Vit-Fe Fumarate-FA (MULTIVITAMIN-PRENATAL) 27-0.8 MG TABS  tablet Take 1 tablet by mouth daily at 12 noon.   Past Month at Unknown time  . cetirizine (ZYRTEC) 10 MG tablet Take 1 tablet (10 mg total) by mouth daily. (Patient not taking: Reported on 12/04/2019) 30 tablet 11 Completed Course at Unknown time   Scheduled:  . Chlorhexidine Gluconate Cloth  6 each Topical Q0600  . loratadine  10 mg Oral Daily  . multivitamin-prenatal  1 tablet Oral Q1200  . sodium chloride (PF)      . sodium chloride flush  3 mL Intravenous Q12H   Infusions:  . sodium chloride 125 mL/hr at 12/05/19 0100  . famotidine (PEPCID) IV Stopped (12/04/19 2110)  . heparin      Assessment: 61 yoF with PMH sickle cell trait, currently [redacted] wks pregnant, presents with sudden onset SOB and LOC earlier today. Found to have extensive bilat PE with RH strain present; troponins elevated; BNP not drawn. Pharmacy to dose IV heparin.    Baseline INR, aPTT: not done  Prior anticoagulation: none  Significant events:  Today, 12/05/2019:  Heparin level = 1.26 (supratherapeutic) with heparin infusing @ 1100 units/hr  No bleeding or infusion issues per  nursing  Prior to heparin starting, patient had received Enoxaparin 60mg  sq x 1 in ED @ 17:53  Goal of Therapy: Heparin level 0.3-0.7 units/ml Monitor platelets by anticoagulation protocol: Yes  Plan:  Hold heparin until 5am  At 5am, resume Heparin @ 1100 units/hr   Check heparin level 6 hrs after restart  Daily CBC, daily heparin level once stable  Monitor for signs of bleeding or thrombosis  , PharmD 12/05/2019, 3:16 AM

## 2019-12-05 NOTE — Progress Notes (Addendum)
Patient transported to IR without complications. Vital signs stable. Patient alert/oriented X4. Renea Ee, RN at bedside during procedure.

## 2019-12-05 NOTE — Progress Notes (Signed)
[name], MRN:  [id number], DOB:  11/24/1990, LOS: 1 ADMISSION DATE:  12/04/2019, CONSULTATION DATE: 12/04/2019 REFERRING MD: Dr. Bernette Mayers, CHIEF COMPLAINT: Chest pain, shortness of breath,  Brief History   [redacted] weeks pregnant, submassive PE, hypotension overnight  History of present illness   Noted to have bilateral PEs with right heart strain  Past Medical History   Past Medical History:  Diagnosis Date  . Abnormal Pap smear 12/10/2011  . Anemia   . Anxiety 2010   anxiety attack;no meds;has not been dx'd  . Emotional instability (HCC)    Break down easily when things are not going her way  . GERD (gastroesophageal reflux disease)   . Hx of joint problems    Shoulders and knees have pain  . Infection    Yeast;would get frequently  . Infection    BV x 1  . Kidney infection 2010   PO antibxs  . Kidney stone complicating pregnancy   . Migraines    no rx'd meds in the past;goes to sleep  . PCOS (polycystic ovarian syndrome)   . Sickle cell trait (HCC)   . Sickle cell trait (HCC)   . Umbilical hernia 1991   Does not cause anyp problems  . Vaginal Pap smear, abnormal      Significant Hospital Events   Hypotension 12/05/2019  Consults:  PCCM Interventional radiology  Procedures:  Arterial line placement 10/2 Central venous access 10/2  Significant Diagnostic Tests:  CT scan of the chest showing bilateral PE with extensive clot burden Elevated troponins  Micro Data:  None  Antimicrobials:  None  Interim history/subjective:  Some chest discomfort Hypotension overnight, did have a presyncopal event during the night Shortness of breath with any activity  Objective   Blood pressure (!) 96/59, pulse 80, temperature 99.1 F (37.3 C), temperature source Oral, resp. rate 19, height 5\' 7"  (1.702 m), weight 62.6 kg, last menstrual period 09/24/2019, SpO2 100 %, unknown if currently breastfeeding.        Intake/Output Summary (Last 24 hours) at  12/05/2019 02/04/2020 Last data filed at 12/05/2019 0330 Gross per 24 hour  Intake 838.63 ml  Output 600 ml  Net 238.63 ml   Filed Weights   12/04/19 1301 12/05/19 0500  Weight: 63.5 kg 62.6 kg    Examination: General: Young lady, does not appear to be in distress on oxygen supplementation HENT: Moist oral mucosa Lungs: Clear breath sounds bilaterally Cardiovascular: S1-S2 appreciated Abdomen: Soft, bowel sounds appreciated Extremities: No clubbing, no edema Neuro: Alert and oriented x3 with no focal findings GU:   Resolved Hospital Problem list     Assessment & Plan:  Submassive pulmonary embolism -With persistent hypotension -Elevated cardiac biomarkers -Right heart strain on CT -Oxygen supplementation to keep oxygen saturations above 95% -Plan is for catheter directed thrombolysis by interventional radiology -Appreciate interventional radiology assistance  Current vitals blood pressure 96/59, heart rate of 80, respiratory rate of 16-19  Plan is to transfer to [facility] for further close monitoring and catheter directed intervention  Sickle cell trait  Echocardiogram completed 12/05/2019 results pending  High risk pregnancy  Best practice:  Diet: Regular diet Pain/Anxiety/Delirium protocol (if indicated): Morphine as needed, Tylenol as needed VAP protocol (if indicated): Not indicated DVT prophylaxis: On full dose anticoagulation with heparin GI prophylaxis: Pepcid Glucose control: Not indicated Mobility: Bedrest Code Status: Full code Family Communication: Patient fully aware Disposition: ICU  Labs   CBC: Recent Labs  Lab 12/04/19 1306  WBC 6.7  HGB 12.1  HCT 34.8*  MCV 79.3*  PLT 217    Basic Metabolic Panel: Recent Labs  Lab 12/04/19 1306 12/05/19 0042  NA 134* 135  K 3.3* 3.8  CL 103 105  CO2 22 21*  GLUCOSE 112* 91  BUN 11 9  CREATININE 0.86 0.80  CALCIUM 8.9 8.5*   GFR: Estimated Creatinine Clearance: 100 mL/min (by C-G formula based  on SCr of 0.8 mg/dL). Recent Labs  Lab 12/04/19 1306  WBC 6.7    Liver Function Tests: Recent Labs  Lab 12/05/19 0042  AST 21  ALT 12  ALKPHOS 38  BILITOT 0.7  PROT 6.4*  ALBUMIN 3.5   No results for input(s): LIPASE, AMYLASE in the last 168 hours. No results for input(s): AMMONIA in the last 168 hours.  ABG No results found for: PHART, PCO2ART, PO2ART, HCO3, TCO2, ACIDBASEDEF, O2SAT   Coagulation Profile: No results for input(s): INR, PROTIME in the last 168 hours.  Cardiac Enzymes: No results for input(s): CKTOTAL, CKMB, CKMBINDEX, TROPONINI in the last 168 hours.  HbA1C: No results found for: HGBA1C  CBG: No results for input(s): GLUCAP in the last 168 hours.  Review of Systems:   Occasional chest discomfort  Past Medical History  She,  has a past medical history of Abnormal Pap smear (12/10/2011), Anemia, Anxiety (2010), Emotional instability (HCC), GERD (gastroesophageal reflux disease), joint problems, Infection, Infection, Kidney infection (2010), Kidney stone complicating pregnancy, Migraines, PCOS (polycystic ovarian syndrome), Sickle cell trait (HCC), Sickle cell trait (HCC), Umbilical hernia (1991), and Vaginal Pap smear, abnormal.   Surgical History    Past Surgical History:  Procedure Laterality Date  . DILATION AND CURETTAGE OF UTERUS  2009   No complications  . WISDOM TOOTH EXTRACTION       Social History   reports that she quit smoking about 8 years ago. Her smoking use included cigarettes. She smoked 0.25 packs per day. She has never used smokeless tobacco. She reports previous alcohol use. She reports that she does not use drugs.   Family History   Her family history includes Anemia in her mother; Arthritis in her paternal grandmother; Asthma in her sister; Diabetes in her maternal aunt and paternal grandfather; Diabetes type I in her cousin; Heart disease in her maternal aunt; Hernia in her maternal aunt; Hypertension in her maternal uncle and  mother; Hyperthyroidism in her paternal grandmother; Migraines in her father; Other in her father; Scoliosis in her paternal grandmother; Sickle cell anemia in her maternal aunt and maternal grandmother; Sickle cell trait in her brother and mother.   Allergies No Known Allergies   Home Medications  Prior to Admission medications   Medication Sig Start Date End Date Taking? Authorizing Provider  albuterol (VENTOLIN HFA) 108 (90 Base) MCG/ACT inhaler Inhale 2 puffs into the lungs every 6 (six) hours as needed for wheezing or shortness of breath. 12/24/18  Yes Hawks, Christy A, FNP  Doxylamine-Pyridoxine 10-10 MG TBEC Take 10 mg by mouth at bedtime. 11/14/19  Yes Hall-Potvin, [place], PA-C  Prenatal Vit-Fe Fumarate-FA (MULTIVITAMIN-PRENATAL) 27-0.8 MG TABS tablet Take 1 tablet by mouth daily at 12 noon.   Yes [provider]  cetirizine (ZYRTEC) 10 MG tablet Take 1 tablet (10 mg total) by mouth daily. Patient not taking: Reported on 12/04/2019 12/09/18   Janeece Agee, NP    The patient is critically ill with multiple organ systems failure and requires high complexity decision making for assessment and support, frequent evaluation and titration of therapies, application  of advanced monitoring technologies and extensive interpretation of multiple databases. Critical Care Time devoted to patient care services described in this note independent of APP/resident time (if applicable)  is 30 minutes.   Virl Diamond MD [facility] Personal pager: [phone] If unanswered, please page CCM On-call: #[phone]

## 2019-12-05 NOTE — Progress Notes (Signed)
ANTICOAGULATION CONSULT NOTE  Pharmacy Consult for IV heparin Indication: PE; pregnant  No Known Allergies  Patient Measurements: Height: 5\' 7"  (170.2 cm) Weight: 62.6 kg (138 lb 0.1 oz) IBW/kg (Calculated) : 61.6 Heparin Dosing Weight: TBW  Vital Signs: Temp: 98.1 F (36.7 C) (10/02 1200) BP: 89/68 (10/02 1445) Pulse Rate: 79 (10/02 1845)  Labs: Recent Labs    12/04/19 1306 12/04/19 1306 12/04/19 1506 12/05/19 0039 12/05/19 0042 12/05/19 0738 12/05/19 1042 12/05/19 1128 12/05/19 1128 12/05/19 1850 12/05/19 1920 12/05/19 1936  HGB 12.1   < >  --   --   --   --   --  9.8*   < >  --  9.8* 8.5*  HCT 34.8*   < >  --   --   --   --   --  28.3*  --   --  28.0* 25.0*  PLT 217  --   --   --   --   --   --  179  --   --  162  --   HEPARINUNFRC  --   --   --  1.26*  --   --  1.06*  --   --  0.73*  --   --   CREATININE 0.86  --   --   --  0.80  --   --   --   --   --  0.76  --   TROPONINIHS 272*  --  1,185*  --   --  616*  --   --   --   --   --   --    < > = values in this interval not displayed.    Estimated Creatinine Clearance: 100 mL/min (by C-G formula based on SCr of 0.76 mg/dL).   Assessment: 49 yoF with PMH sickle cell trait, currently [redacted] wks pregnant, presents with sudden onset SOB and LOC earlier today. Found to have extensive bilat PE with RH strain present; troponins elevated; BNP not drawn. Pharmacy to dose IV heparin.    Baseline INR, aPTT: not done  Prior anticoagulation: none  Significant events:  Today, 12/05/2019: 8:13 PM   Heparin level = 0.73  Hgb down slightly  No bleeding or infusion issues per nursing  Fibrinogen 272  Concurrent B/L TPA over 12 hours  Goal of Therapy: Heparin level 0.3-0.7 units/ml Monitor platelets by anticoagulation protocol: Yes  Plan:  Decrease heparin to 850 units/hr  Check heparin level, CBC, and fibrinogen q 6 h while infusing alteplase and post infusion  Monitor for signs of bleeding or  thrombosis  02/04/2020, PharmD, BCPS [phone] 12/05/2019 8:13 PM

## 2019-12-05 NOTE — Progress Notes (Signed)
PROGRESS NOTE    Tasha Wu  JHE:[id number] DOB: 09-29-1990 DOA: 12/04/2019 PCP: Janeece Agee, NP    Chief Complaint  Patient presents with   Chest Pain   Shortness of Breath   Loss of Consciousness    Brief Narrative:  Patient is a pleasant 29 year old female presenting with likely pregnancy related submassive bilateral PE, placed in the ICU placed on IV heparin, overnight noted to have episodes of hypotension, patient also noted with elevated cardiac enzymes, concern for cardiovascular instability secondary to submassive bilateral PE.  PCCM consulted.  IR consulted.  Patient being transferred to [facility] for evaluation for systemic IV lytic therapy versus catheter directed therapies.   Assessment & Plan:   Principal Problem:   Bilateral pulmonary embolism (HCC) Active Problems:   High-risk pregnancy in first trimester   Anemia   Anxiety   Gastroesophageal reflux disease   Pregnant  Save some 1  Bilateral submassive PE Likely pregnancy related.  Patient with no family history of prior history of DVT or PE.  Patient with no recent surgeries or long travel.  Patient noted overnight to be hypotensive, with some dizziness, symptomatic.  Cardiac enzymes were trending up on admission.  2D echo pending.  Lower extremity Dopplers pending.  Systolic blood pressures in the 90s.  Patient on IV fluids.  PCCM consulted and are following.  IR consulted and patient being transferred to [facility] for evaluation for systemic IV lytic therapy versus catheter directed therapies.  Continue IV heparin.  Per PCCM and IR.  Supportive care.  Follow.  2.  High risk pregnancy On presentation patient noted to be [redacted] weeks pregnant.  Patient now with bilateral pulmonary emboli.  Patient with history of sickle cell trait.  Patient high risk pregnancy.  2D echo pending.  Lower extremity Dopplers pending.  Case was discussed on admission with OB/GYN, Dr.Pinn who recommended  viability ultrasound.  Ultrasound of the pelvis done with single intrauterine gestation with a gestational age of [redacted] weeks 0 days by crown-rump length sonographic estimation, small-volume subchorionic hemorrhage as well as borderline fetal tachycardia.  OB/GYN consulted and will be assessing the patient.  3.  Gastroesophageal reflux disease IV Pepcid.  4.  Hypokalemia Repleted.  Potassium at 3.8.   DVT prophylaxis: Heparin Code Status: Full Family Communication: Updated patient.  No family at bedside. Disposition:   Status is: Inpatient    Dispo: The patient is from: Home              Anticipated d/c is to: Home              Anticipated d/c date is: To be determined              Patient currently unstable with bilateral PE with concerns for hemodynamic instability being transferred to [facility] for evaluation for systemic IV lytic therapy and catheter directed therapies.  Not stable for discharge.       Consultants:   PCCM: Dr.Olalere 12/04/2019  OB/GYN: Dr.Pinn--pending  Interventional radiology: Dr. Loreta Ave 12/05/2019  Procedures:   CT angiogram chest 12/04/2019  Chest x-ray 12/04/2019  Viability pelvic ultrasound 12/04/2019  2D echo pending  Lower extremity Dopplers pending  Antimicrobials:   None   Subjective: Events overnight/early this morning noted.  Patient stated yesterday evening when she sat on the side of the bed to get up to go to the commode she had significant pleuritic chest pain and palpitations as well as dizziness.  Patient denies any  bleeding.  States some improvement with shortness of breath this morning.  Objective: Vitals:   12/05/19 0500 12/05/19 0503 12/05/19 0600 12/05/19 0700  BP:  (!) 86/46 98/80 (!) 96/59  Pulse:  94 93 80  Resp:  (!) 21 15 19   Temp:      TempSrc:      SpO2:  99% 100% 100%  Weight: 62.6 kg     Height:        Intake/Output Summary (Last 24 hours) at 12/05/2019 0801 Last data filed at 12/05/2019  0330 Gross per 24 hour  Intake 838.63 ml  Output 600 ml  Net 238.63 ml   Filed Weights   12/04/19 1301 12/05/19 0500  Weight: 63.5 kg 62.6 kg    Examination:  General exam: Appears calm and comfortable  Respiratory system: Clear to auscultation. Respiratory effort normal. Cardiovascular system: S1 & S2 heard, RRR. No JVD, murmurs, rubs, gallops or clicks. No pedal edema. Gastrointestinal system: Abdomen is nondistended, soft and nontender. No organomegaly or masses felt. Normal bowel sounds heard. Central nervous system: Alert and oriented. No focal neurological deficits. Extremities: Symmetric 5 x 5 power. Skin: No rashes, lesions or ulcers Psychiatry: Judgement and insight appear normal. Mood & affect appropriate.     Data Reviewed: I have personally reviewed following labs and imaging studies  CBC: Recent Labs  Lab 12/04/19 1306  WBC 6.7  HGB 12.1  HCT 34.8*  MCV 79.3*  PLT 217    Basic Metabolic Panel: Recent Labs  Lab 12/04/19 1306 12/05/19 0042  NA 134* 135  K 3.3* 3.8  CL 103 105  CO2 22 21*  GLUCOSE 112* 91  BUN 11 9  CREATININE 0.86 0.80  CALCIUM 8.9 8.5*    GFR: Estimated Creatinine Clearance: 100 mL/min (by C-G formula based on SCr of 0.8 mg/dL).  Liver Function Tests: Recent Labs  Lab 12/05/19 0042  AST 21  ALT 12  ALKPHOS 38  BILITOT 0.7  PROT 6.4*  ALBUMIN 3.5    CBG: No results for input(s): GLUCAP in the last 168 hours.   Recent Results (from the past 240 hour(s))  Respiratory Panel by RT PCR (Flu A&B, Covid) - Nasopharyngeal Swab     Status: None   Collection Time: 12/04/19  5:58 PM   Specimen: Nasopharyngeal Swab  Result Value Ref Range Status   SARS Coronavirus 2 by RT PCR NEGATIVE NEGATIVE Final    Comment: (NOTE) SARS-CoV-2 target nucleic acids are NOT DETECTED.  The SARS-CoV-2 RNA is generally detectable in upper respiratoy specimens during the acute phase of infection. The lowest concentration of SARS-CoV-2 viral  copies this assay can detect is 131 copies/mL. A negative result does not preclude SARS-Cov-2 infection and should not be used as the sole basis for treatment or other patient management decisions. A negative result may occur with  improper specimen collection/handling, submission of specimen other than nasopharyngeal swab, presence of viral mutation(s) within the areas targeted by this assay, and inadequate number of viral copies (<131 copies/mL). A negative result must be combined with clinical observations, patient history, and epidemiological information. The expected result is Negative.  Fact Sheet for Patients:  https://www.moore.com/https://www.fda.gov/media/142436/download  Fact Sheet for Healthcare Providers:  https://www.young.biz/https://www.fda.gov/media/142435/download  This test is no t yet approved or cleared by the [place] FDA and  has been authorized for detection and/or diagnosis of SARS-CoV-2 by FDA under an Emergency Use Authorization (EUA). This EUA will remain  in effect (meaning this test can be used) for the  duration of the COVID-19 declaration under Section 564(b)(1) of the Act, 21 U.S.C. section 360bbb-3(b)(1), unless the authorization is terminated or revoked sooner.     Influenza A by PCR NEGATIVE NEGATIVE Final   Influenza B by PCR NEGATIVE NEGATIVE Final    Comment: (NOTE) The Xpert Xpress SARS-CoV-2/FLU/RSV assay is intended as an aid in  the diagnosis of influenza from Nasopharyngeal swab specimens and  should not be used as a sole basis for treatment. Nasal washings and  aspirates are unacceptable for Xpert Xpress SARS-CoV-2/FLU/RSV  testing.  Fact Sheet for Patients: https://www.moore.com/  Fact Sheet for Healthcare Providers: https://www.young.biz/  This test is not yet approved or cleared by the [place] FDA and  has been authorized for detection and/or diagnosis of SARS-CoV-2 by  FDA under an Emergency Use Authorization (EUA). This  EUA will remain  in effect (meaning this test can be used) for the duration of the  Covid-19 declaration under Section 564(b)(1) of the Act, 21  U.S.C. section 360bbb-3(b)(1), unless the authorization is  terminated or revoked. Performed at [facility], [address] [place], [place] [address]   MRSA PCR Screening     Status: None   Collection Time: 12/04/19  7:12 PM   Specimen: Nasal Mucosa; Nasopharyngeal  Result Value Ref Range Status   MRSA by PCR NEGATIVE NEGATIVE Final    Comment:        The GeneXpert MRSA Assay (FDA approved for NASAL specimens only), is one component of a comprehensive MRSA colonization surveillance program. It is not intended to diagnose MRSA infection nor to guide or monitor treatment for MRSA infections. Performed at [facility], [address] [place], [place] [address]          Radiology Studies: DG Chest 2 View  Result Date: 12/04/2019 CLINICAL DATA:  Chest pain. EXAM: CHEST - 2 VIEW COMPARISON:  10/02/2012 FINDINGS: The heart size and mediastinal contours are within normal limits. Both lungs are clear. No evidence of pneumothorax or pleural effusion. The visualized skeletal structures are unremarkable. IMPRESSION: Normal study. Electronically Signed   By: Danae Orleans M.D.   On: 12/04/2019 13:30   CT Angio Chest PE W and/or Wo Contrast  Result Date: 12/04/2019 CLINICAL DATA:  Patient is pregnant. Chest pain, pressure, no shortness of breath or cough. EXAM: CT ANGIOGRAPHY CHEST WITH CONTRAST TECHNIQUE: Multidetector CT imaging of the chest was performed using the standard protocol during bolus administration of intravenous contrast. Multiplanar CT image reconstructions and MIPs were obtained to evaluate the vascular anatomy. CONTRAST:  OMNIPAQUE IOHEXOL 350 MG/ML SOLN COMPARISON:  CT chest 12/08/2017 FINDINGS: Cardiovascular: Satisfactory opacification of the pulmonary arteries to the segmental  level. There is extensive bilateral nonocclusive and occlusive thrombus involving the right upper, right middle and right lower lobes as well as the left lingula and left lower lobe. Thrombus extends slightly into the left main pulmonary artery. The RV to LV ratio is 1.4 with evidence of right heart strain. Normal caliber of the main pulmonary artery and thoracic aorta. Mediastinum/Nodes: No enlarged mediastinal, hilar, or axillary lymph nodes. Thyroid gland, trachea, and esophagus demonstrate no significant findings. Lungs/Pleura: Lungs are clear. No pleural effusion or pneumothorax. Upper Abdomen: No acute abnormality. Musculoskeletal: No chest wall abnormality. No acute or significant osseous findings. Review of the MIP images confirms the above findings. IMPRESSION: Extensive bilateral nonocclusive and occlusive thrombus, as detailed above. Positive for acute PE with CT evidence of right heart strain (RV/LV Ratio =  1.4) consistent with at least submassive (intermediate risk) PE. The presence of right heart strain has been associated with an increased risk of morbidity and mortality. These results were called by telephone at the time of interpretation on 12/04/2019 at 4:35 pm to provider [facility] , who verbally acknowledged these results. Electronically Signed   By: Emmaline Kluver M.D.   On: 12/04/2019 16:36   US OB Comp Less 14 Wks  Result Date: 12/04/2019 CLINICAL DATA:  Pregnancy, ICU patient EXAM: OBSTETRIC <14 WK ULTRASOUND TECHNIQUE: Transabdominal ultrasound was performed for evaluation of the gestation as well as the maternal uterus and adnexal regions. COMPARISON:  Obstetrical ultrasound 08/23/2019 FINDINGS: Intrauterine gestational sac: Single Yolk sac:  Visualized. Embryo:  Visualized. Cardiac Activity: Visualized. Heart Rate: 188 bpm CRL:   31.8 mm   10 w 0 d                  [place] EDC: 07/01/2020 Subchorionic hemorrhage: Small volume mixed hypoechoic to anechoic subchorionic hemorrhage is  seen. Maternal uterus/adnexae: Normal anteverted uterus. No other focal uterine abnormalities. No concerning adnexal lesions. No free fluid in the pelvis. IMPRESSION: Single intrauterine gestation with a gestational age of [redacted] weeks, 0 days by crown-rump length sonographic estimation. Small volume subchorionic hemorrhage as well as borderline fetal tachycardia. Recommend correlation with clinical findings and obstetrical consultation if not previously obtained. Electronically Signed   By: Kreg Shropshire M.D.   On: 12/04/2019 22:00        Scheduled Meds:  Chlorhexidine Gluconate Cloth  6 each Topical Q0600   loratadine  10 mg Oral Daily   multivitamin-prenatal  1 tablet Oral Q1200   sodium chloride flush  10-40 mL Intracatheter Q12H   sodium chloride flush  3 mL Intravenous Q12H   Continuous Infusions:  sodium chloride 125 mL/hr at 12/05/19 0100   famotidine (PEPCID) IV Stopped (12/04/19 2110)   heparin 1,100 Units/hr (12/05/19 0504)     LOS: 1 day    Time spent: 40 minutes ordered    Ramiro Harvest, MD [facility]   To contact the attending provider between 7A-7P or the covering provider during after hours 7P-7A, please log into the web site [email or web address] and access using universal [place] password for that web site. If you do not have the password, please call the hospital operator.  12/05/2019, 8:01 AM

## 2019-12-05 NOTE — Consult Note (Signed)
Chief Complaint: Acute SOB, Submassive PE  Referring Physician(s): Dr. Felisa Bonier, CC/Pulm  Supervising Physician: Gilmer Mor  Patient Status: [facility] - In-pt  History of Present Illness: Tasha Wu is a 29 y.o. female presenting to [facility] ED 12/04/2019 with acute left chest pain, SOB, and syncopal episode at home. Imaging has shown bilateral PE, and anticoagulation has been initiated.   She was admitted to stepdown for monitoring.  Her SBP's have remained below 100 (83-93), with adequate O2 (100% on [place] 2L).  HR 81-100.  In room O2 remains 100%, SBP around 90. Tachycardic only once to 105, as she got somewhat emotional discussing her care.   VIR was called for consultation for candidacy for catheter directed therapy for the submassive PE status after the patient ambulated to the restroom in the middle of the night, which caused another pre-syncopal episode.   Patient reports that she is feeling ok in bed at this time.  She has never had event such as this before.  Prior 4 pregnancies are unremarkable.  No history of DVT.  She denies any knowledge of VTE/DVT in her immediate family members.    She denies any leg swelling.  She states that sometimes her right leg "goes numb" when she sleeps, but she says this happened before her pregnancy.    Troponin is positive, trend up from 272 >> 1185 No leukocytosis. Platelets 217 Cr normal  ECHO pending.  CT shows bilateral PE.  RV/LV ratio is estimated 1.4  Simplified PESI score is +1, given her persistent hypotension.   She confirmed that she has not had recent stroke, any cancer or surgery history, she has no history of GI bleeding such as gastric ulcer disease or any lower GI bleeding.   CC team placing central line while we spoke.   Past Medical History:  Diagnosis Date  . Abnormal Pap smear 12/10/2011  . Anemia   . Anxiety 2010   anxiety attack;no meds;has not been dx'd  . Emotional instability (HCC)    Break down  easily when things are not going her way  . GERD (gastroesophageal reflux disease)   . Hx of joint problems    Shoulders and knees have pain  . Infection    Yeast;would get frequently  . Infection    BV x 1  . Kidney infection 2010   PO antibxs  . Kidney stone complicating pregnancy   . Migraines    no rx'd meds in the past;goes to sleep  . PCOS (polycystic ovarian syndrome)   . Sickle cell trait (HCC)   . Sickle cell trait (HCC)   . Umbilical hernia 1991   Does not cause anyp problems  . Vaginal Pap smear, abnormal     Past Surgical History:  Procedure Laterality Date  . DILATION AND CURETTAGE OF UTERUS  2009   No complications  . WISDOM TOOTH EXTRACTION      Allergies: Patient has no known allergies.  Medications: Prior to Admission medications   Medication Sig Start Date End Date Taking? Authorizing Provider  albuterol (VENTOLIN HFA) 108 (90 Base) MCG/ACT inhaler Inhale 2 puffs into the lungs every 6 (six) hours as needed for wheezing or shortness of breath. 12/24/18  Yes Hawks, Christy A, FNP  Doxylamine-Pyridoxine 10-10 MG TBEC Take 10 mg by mouth at bedtime. 11/14/19  Yes Hall-Potvin, [place], PA-C  Prenatal Vit-Fe Fumarate-FA (MULTIVITAMIN-PRENATAL) 27-0.8 MG TABS tablet Take 1 tablet by mouth daily at 12 noon.   Yes [provider]  cetirizine (ZYRTEC) 10 MG tablet Take 1 tablet (10 mg total) by mouth daily. Patient not taking: Reported on 12/04/2019 12/09/18   Janeece Agee, NP     Family History  Problem Relation Age of Onset  . Sickle cell trait Mother   . Hypertension Mother   . Anemia Mother   . Sickle cell trait Brother   . Sickle cell anemia Maternal Aunt        Recently deceased from [facility] crisis  . Diabetes Maternal Aunt   . Sickle cell anemia Maternal Grandmother        Deceased  . Heart disease Maternal Aunt   . Asthma Sister        1/2 sibling;father's child  . Hyperthyroidism Paternal Grandmother   . Scoliosis Paternal Grandmother     . Arthritis Paternal Grandmother   . Migraines Father   . Other Father        Joint Problems;shoulders and knees  . Hernia Maternal Aunt        Umbilical  . Hypertension Maternal Uncle   . Diabetes type I Cousin        maternal  . Diabetes Paternal Grandfather     Social History   Socioeconomic History  . Marital status: Single    Spouse name: Not on file  . Number of children: 2  . Years of education: 56  . Highest education level: Not on file  Occupational History  . Not on file  Tobacco Use  . Smoking status: Former Smoker    Packs/day: 0.25    Types: Cigarettes    Quit date: 11/18/2011    Years since quitting: 8.0  . Smokeless tobacco: Never Used  Vaping Use  . Vaping Use: Never used  Substance and Sexual Activity  . Alcohol use: Not Currently    Comment: occ  . Drug use: No  . Sexual activity: Yes    Partners: Male    Birth control/protection: None  Other Topics Concern  . Not on file  Social History Narrative  . Not on file   Social Determinants of Health   Financial Resource Strain:   . Difficulty of Paying Living Expenses: Not on file  Food Insecurity:   . Worried About Programme researcher, broadcasting/film/video in the Last Year: Not on file  . Ran Out of Food in the Last Year: Not on file  Transportation Needs:   . Lack of Transportation (Medical): Not on file  . Lack of Transportation (Non-Medical): Not on file  Physical Activity:   . Days of Exercise per Week: Not on file  . Minutes of Exercise per Session: Not on file  Stress:   . Feeling of Stress : Not on file  Social Connections:   . Frequency of Communication with Friends and Family: Not on file  . Frequency of Social Gatherings with Friends and Family: Not on file  . Attends Religious Services: Not on file  . Active Member of Clubs or Organizations: Not on file  . Attends Banker Meetings: Not on file  . Marital Status: Not on file     Review of Systems: A 12 point ROS discussed and  pertinent positives are indicated in the HPI above.  All other systems are negative.  Review of Systems  Vital Signs: BP (!) 89/57   Pulse 93   Temp 99.1 F (37.3 C) (Oral)   Resp 16   Ht 5\' 7"  (1.702 m)   Wt 63.5 kg   LMP  09/24/2019   SpO2 100%   BMI 21.93 kg/m   Physical Exam General: 29 yo female appearing stated age.  Well-developed, well-nourished.  No distress. HEENT: Atraumatic, normocephalic.  Conjugate gaze, extra-ocular motor intact. No scleral icterus or scleral injection. No lesions on external ears, nose, lips, or gums.  Oral mucosa moist, pink.  Neck: Symmetric with no goiter enlargement.  Chest/Lungs:  Symmetric chest with inspiration/expiration.  No labored breathing.  No cough..  Heart:    No JVD appreciated.  Sinus rhythm.  Tachy Abdomen:  Could not assess Genito-urinary: Deferred Neurologic: Alert & Oriented to person, place, and time.   Normal affect and insight.  Appropriate questions.         Imaging: DG Chest 2 View  Result Date: 12/04/2019 CLINICAL DATA:  Chest pain. EXAM: CHEST - 2 VIEW COMPARISON:  10/02/2012 FINDINGS: The heart size and mediastinal contours are within normal limits. Both lungs are clear. No evidence of pneumothorax or pleural effusion. The visualized skeletal structures are unremarkable. IMPRESSION: Normal study. Electronically Signed   By: Danae OrleansJohn A Stahl M.D.   On: 12/04/2019 13:30   CT Angio Chest PE W and/or Wo Contrast  Result Date: 12/04/2019 CLINICAL DATA:  Patient is pregnant. Chest pain, pressure, no shortness of breath or cough. EXAM: CT ANGIOGRAPHY CHEST WITH CONTRAST TECHNIQUE: Multidetector CT imaging of the chest was performed using the standard protocol during bolus administration of intravenous contrast. Multiplanar CT image reconstructions and MIPs were obtained to evaluate the vascular anatomy. CONTRAST:  100mL OMNIPAQUE IOHEXOL 350 MG/ML SOLN COMPARISON:  CT chest 12/08/2017 FINDINGS: Cardiovascular: Satisfactory  opacification of the pulmonary arteries to the segmental level. There is extensive bilateral nonocclusive and occlusive thrombus involving the right upper, right middle and right lower lobes as well as the left lingula and left lower lobe. Thrombus extends slightly into the left main pulmonary artery. The RV to LV ratio is 1.4 with evidence of right heart strain. Normal caliber of the main pulmonary artery and thoracic aorta. Mediastinum/Nodes: No enlarged mediastinal, hilar, or axillary lymph nodes. Thyroid gland, trachea, and esophagus demonstrate no significant findings. Lungs/Pleura: Lungs are clear. No pleural effusion or pneumothorax. Upper Abdomen: No acute abnormality. Musculoskeletal: No chest wall abnormality. No acute or significant osseous findings. Review of the MIP images confirms the above findings. IMPRESSION: Extensive bilateral nonocclusive and occlusive thrombus, as detailed above. Positive for acute PE with CT evidence of right heart strain (RV/LV Ratio = 1.4) consistent with at least submassive (intermediate risk) PE. The presence of right heart strain has been associated with an increased risk of morbidity and mortality. These results were called by telephone at the time of interpretation on 12/04/2019 at 4:35 pm to [facility] , who verbally acknowledged these results. Electronically Signed   By: Emmaline KluverNancy  Ballantyne M.D.   On: 12/04/2019 16:36   US OB Comp Less 14 Wks  Result Date: 12/04/2019 CLINICAL DATA:  Pregnancy, ICU patient EXAM: OBSTETRIC <14 WK ULTRASOUND TECHNIQUE: Transabdominal ultrasound was performed for evaluation of the gestation as well as the maternal uterus and adnexal regions. COMPARISON:  Obstetrical ultrasound 08/23/2019 FINDINGS: Intrauterine gestational sac: Single Yolk sac:  Visualized. Embryo:  Visualized. Cardiac Activity: Visualized. Heart Rate: 188 bpm CRL:   31.8 mm   10 w 0 d                  US EDC: 07/01/2020 Subchorionic hemorrhage: Small volume  mixed hypoechoic to anechoic subchorionic hemorrhage is seen. Maternal uterus/adnexae: Normal anteverted uterus.  No other focal uterine abnormalities. No concerning adnexal lesions. No free fluid in the pelvis. IMPRESSION: Single intrauterine gestation with a gestational age of [redacted] weeks, 0 days by crown-rump length sonographic estimation. Small volume subchorionic hemorrhage as well as borderline fetal tachycardia. Recommend correlation with clinical findings and obstetrical consultation if not previously obtained. Electronically Signed   By: Kreg Shropshire M.D.   On: 12/04/2019 22:00    Labs:  CBC: Recent Labs    08/23/19 1115 12/04/19 1306  WBC 3.7* 6.7  HGB 12.3 12.1  HCT 37.1 34.8*  PLT 266 217    COAGS: No results for input(s): INR, APTT in the last 8760 hours.  BMP: Recent Labs    12/04/19 1306 12/05/19 0042  NA 134* 135  K 3.3* 3.8  CL 103 105  CO2 22 21*  GLUCOSE 112* 91  BUN 11 9  CALCIUM 8.9 8.5*  CREATININE 0.86 0.80  GFRNONAA >60 >60  GFRAA >60 >60    LIVER FUNCTION TESTS: Recent Labs    12/05/19 0042  BILITOT 0.7  AST 21  ALT 12  ALKPHOS 38  PROT 6.4*  ALBUMIN 3.5    TUMOR MARKERS: No results for input(s): AFPTM, CEA, CA199, CHROMGRNA in the last 8760 hours.  Assessment and Plan: Ms Mattson is 29 yo female presenting with pregnancy-related VTE, submassive category PE, intermediate risk based on biomarkers and sPESI score.    Initiated on heparin therapy at this time, WL stepdown.   She is currently pregnant with her 5th pregnancy, 9-10 weeks.   I had a lengthy discussion with her regarding VTE, including DVT and PE, including the physiology/pathophysiology of the PE clot burden as a "dam in the river" and the consequences of the increased resistance, right heart strain/dilation, decreased pre-load, and risk of cardiovascular collapse.  I assured her that she is currently on the standard of care AC, and we further discussed both systemic IV lytic  therapy, as well as catheter directed therapies.   Regarding systemic lytic therapy, IV tPA  infused 2 hours; as 29yo female she is low risk for any complication such as CNS bleed or GI bleed.  Lytic agents are thought not to cross the placenta, and uterine hemorrhage is low risk but not 0 risk.  Regarding interventional techniques, we specifically discussed our options for catheter-directed therapy, which might include: 1) catheter-directed lysis with tPA, which would be a ~25% reduction in lytic dose, typically delivered over 12 hours, but potentially shorter times/volumes have shown benefit, 2) mechanical thrombectomy/aspiration, 3) combination therapy.  A full informed consent was not yet performed.   She did confirm that if it came to a decision regarding life-saving procedures, she would choose to prioritize herself and her current family (4 young children).   At this time the patient is awaiting ECHO and possible bed placement at [facility].    We will discuss with OB/GYN team.    Plan: - Agree with heparin therapy - She is a candidate for catheter directed therapy based on above assessment.  We will discuss with OB regarding best strategy - ECHO pending. - Call with questions/concerns   Thank you for this interesting consult.  I greatly enjoyed meeting [facility] and look forward to participating in their care.  A copy of this report was sent to the requesting provider on this date.  Electronically Signed: Gilmer Mor, DO 12/05/2019, 6:05 AM   I spent a total of 80 Minutes    in face to face  in clinical consultation, greater than 50% of which was counseling/coordinating care for acute submassive high-risk PE, possible catheter directed therapy.

## 2019-12-05 NOTE — Progress Notes (Signed)
ABG was done on I-STAT by RN, results in Epic.

## 2019-12-05 NOTE — Progress Notes (Signed)
Seen in ICU post catheter placement for thrombolysis  She knows to keep leg straight which he can place  Clear liquid diet only  Pain management as needed  She is stable hemodynamically

## 2019-12-05 NOTE — Procedures (Signed)
Arterial Catheter Insertion Procedure Note  Tasha Wu  [id number]  June 08, 1990  Date:12/05/19  Time:6:36 AM    Provider Performing: Omar Person    Procedure: Insertion of Arterial Line (58099) with US guidance (83382)   Indication(s) Blood pressure monitoring and/or need for frequent ABGs  Consent Risks of the procedure as well as the alternatives and risks of each were explained to the patient and/or caregiver.  Consent for the procedure was obtained and is signed in the bedside chart  Anesthesia lidocaine 1% without epi   Time Out Verified patient identification, verified procedure, site/side was marked, verified correct patient position, special equipment/implants available, medications/allergies/relevant history reviewed, required imaging and test results available.   Sterile Technique Maximal sterile technique including full sterile barrier drape, hand hygiene, sterile gown, sterile gloves, mask, hair covering, sterile ultrasound probe cover (if used).   Procedure Description Area of catheter insertion was cleaned with chlorhexidine and draped in sterile fashion. With real-time ultrasound guidance an arterial catheter was placed into the right radial artery.  Appropriate arterial tracings confirmed on monitor.     Complications/Tolerance None; patient tolerated the procedure well.   EBL Minimal   Specimen(s) None

## 2019-12-05 NOTE — Progress Notes (Signed)
  Echocardiogram 2D Echocardiogram has been performed.  Tasha Wu 12/05/2019, 8:37 AM

## 2019-12-05 NOTE — Progress Notes (Signed)
Called to the bedside by eICU.  The patient had developed intermittent chest pain with mild low back pain.  The patient describes sporadic chest heaviness with acute onset shortness of breath that lasts seconds at a time.  While talking with the patient it became evident that the symptoms occurred when she was having PVCs.  Through the evening the patient had increasing PVCs.  The back discomfort was more muscular and reproducible with direct palpation next to the paraspinal muscles.  Chest x-ray: No infiltrates effusions or traumatic injury seen.  Performed echocardiogram pulmonary ultrasound and abdominal ultrasound.  Echocardiogram did not show pericardial effusion.  The ejection fraction was preserved.  Mild dilation of the RV.  Fast scan of the abdomen did not show evidence of free fluid.  Retroperitoneal space around the kidneys was seen.  No evidence of fluid around the kidneys.  IUP was identified.  No evidence of free fluid within the uterus, uterus appears to be appropriate size, no fluid around the outside of the uterus.  Potassium 3.4 Fibrinogen 277  Assessment Symptomatic PVCs TPA infusion for pulmonary embolism  Plan: Since the hemoglobin is essentially unchanged we will resume TPA.  Discussed this is a case with Dr. Loreta Ave from interventional radiology. Replete potassium now. Follow hemoglobin every 2 hours at least through the night to make sure that it remains stable while on TPA. Updated the patient and patient's fianc at the bedside.  Critical care time was 45 minutes.

## 2019-12-05 NOTE — Progress Notes (Signed)
Patient complaining of worsening lower back pain and voiced concerns of a miscarriage, Dr. Mora Appl made aware. Orders received to monitor for bleeding and administer pain medication as need. Will continue to monitor.

## 2019-12-05 NOTE — Procedures (Addendum)
Interventional Radiology Procedure Note  Procedure:   US guided left femoral vein access x 2 with 2x7F sheath. Placement of bilateral pulmonary artery infusion catheters.   Initiation of tPA catheter thrombolysis  Complications: None  Plan: - each sheath transmits a 90 cm working length 15cm infusion length Unifuse catheter. Leftward into the left.  Rightward into the right. - Each sheath will receive 20cc / hr saline for KVO - Each Unifuse will receive 1mg  tpa per hour x 12 hours, for a total of 24mg  in 12 hours - low dose heparin protocol per pharmacy, thank you - Q6hr fibrinogen - Q6hr H&H - Q6hr heparin level  Findings: 32/14 (25)  Recommendations:  - ICU overnight during lysis - Do not submerge   - Left hip straight with the sheaths in place. - labs per above - if diet advanced, recommend clear liquids only until lysis complete  Signed,  . , DO

## 2019-12-05 NOTE — Sedation Documentation (Signed)
PAP measurement: 30/14 (22)

## 2019-12-05 NOTE — Progress Notes (Signed)
Bilateral lower extremity venous duplex completed. Refer to "CV Proc" under chart review to view preliminary results.  12/05/2019 1:21 PM Eula Fried., MHA, RVT, RDCS, RDMS

## 2019-12-05 NOTE — Progress Notes (Addendum)
OB/GYN MD CONSULT NOTE  Patient ID: Tasha Wu, female   DOB: 1990-07-22, 29 y.o.   MRN: [id number]  Reason for Consult: 29 yo [id number] at 10 weeks by ultrasound with bilateral pulmonary emboli  Referring Physician: Dr. Myra Gianotti Mudgett is an 29 y.o. female.  Tasha Wu is a 29 year old female, P 3 at 10 weeks , with bilateral pulmonary emboli.   Patient has received her obstetric care and gynecologic care at [facility].  Her last visit was 12/2018 for her annual.  She was placed on Camilla, a progesterone only birth control pill. She has a history of migraines with aura, so she was switched from combined OCPs to progesterone only for risk of stroke and VTE. Patient notes that she has been off any hormonal contraceptives for months.  She has no medical history of DVT, VTE, or PE. She has no thrombophilia or clotting d/o nor any significant family history.  She has known sickle cell trait.   Patient was admitted to medicine service yesterday at [facility].  She presented with history of left-sided pleuritic chest pain, shortness of breath, dizziness.  Patient had syncopal episode. Patient subsequently regained consciousness and subsequently brought to the ED.  Patient with complaints of ongoing left-sided pleuritic chest pain, shortness of breath.  Patient denies any fevers, no chills, no abdominal pain, no diarrhea, no constipation, no melena, no hematemesis, no hematochezia.  No focal neurological deficits.  No bleeding noted.    Pertinent Gynecological History: Menses: regular every month without intermenstrual spotting Bleeding: normal Contraception: condoms  Was on progesterone only birth control pills DES exposure: denies Blood transfusions: none Sexually transmitted diseases: no past history Previous GYN Procedures: DNC  Last mammogram: n/a  Last pap: abnormal: ascus HR HPV negative Date: 08/2017 OB History: G6, P3023   Menstrual History: Menarche age:  25 Patient's last menstrual period was 09/24/2019.    Past Medical History:  Diagnosis Date  . Abnormal Pap smear 12/10/2011  . Anemia   . Anxiety 2010   anxiety attack;no meds;has not been dx'd  . Emotional instability (HCC)    Break down easily when things are not going her way  . GERD (gastroesophageal reflux disease)   . Hx of joint problems    Shoulders and knees have pain  . Infection    Yeast;would get frequently  . Infection    BV x 1  . Kidney infection 2010   PO antibxs  . Kidney stone complicating pregnancy   . Migraines    no rx'd meds in the past;goes to sleep  . PCOS (polycystic ovarian syndrome)   . Sickle cell trait (HCC)   . Sickle cell trait (HCC)   . Umbilical hernia 1991   Does not cause anyp problems  . Vaginal Pap smear, abnormal     Past Surgical History:  Procedure Laterality Date  . DILATION AND CURETTAGE OF UTERUS  2009   No complications  . WISDOM TOOTH EXTRACTION      Family History  Problem Relation Age of Onset  . Sickle cell trait Mother   . Hypertension Mother   . Anemia Mother   . Sickle cell trait Brother   . Sickle cell anemia Maternal Aunt        Recently deceased from [facility] crisis  . Diabetes Maternal Aunt   . Sickle cell anemia Maternal Grandmother        Deceased  . Heart disease Maternal Aunt   . Asthma Sister  1/2 sibling;father's child  . Hyperthyroidism Paternal Grandmother   . Scoliosis Paternal Grandmother   . Arthritis Paternal Grandmother   . Migraines Father   . Other Father        Joint Problems;shoulders and knees  . Hernia Maternal Aunt        Umbilical  . Hypertension Maternal Uncle   . Diabetes type I Cousin        maternal  . Diabetes Paternal Grandfather     Social History:  reports that she quit smoking about 8 years ago. Her smoking use included cigarettes. She smoked 0.25 packs per day. She has never used smokeless tobacco. She reports previous alcohol use. She reports that she does not use  drugs.  Allergies: No Known Allergies  Medications: Heparin, Tylenol, prn morphine, prenatal vitamins    Blood pressure (!) 81/59, pulse 95, temperature 98.1 F (36.7 C), resp. rate 17, height 5\' 7"  (1.702 m), weight 62.6 kg, last menstrual period 09/24/2019, SpO2 99 %, unknown if currently breastfeeding.  Reviewed vitals and MD/ RN notes.   Pelvic Exam deferred  Results for orders placed or performed during the hospital encounter of 12/04/19 (from the past 48 hour(s))  Basic metabolic panel     Status: Abnormal   Collection Time: 12/04/19  1:06 PM  Result Value Ref Range   Sodium 134 (L) 135 - 145 mmol/L   Potassium 3.3 (L) 3.5 - 5.1 mmol/L   Chloride 103 98 - 111 mmol/L   CO2 22 22 - 32 mmol/L   Glucose, Bld 112 (H) 70 - 99 mg/dL    Comment: Glucose reference range applies only to samples taken after fasting for at least 8 hours.   BUN 11 6 - 20 mg/dL   Creatinine, Ser 02/03/20 0.44 - 1.00 mg/dL   Calcium 8.9 8.9 - 4.09 mg/dL   GFR calc non Af Amer >60 >60 mL/min   GFR calc Af Amer >60 >60 mL/min   Anion gap 9 5 - 15    Comment: Performed at [facility], [address], [place], [place]  CBC     Status: Abnormal   Collection Time: 12/04/19  1:06 PM  Result Value Ref Range   WBC 6.7 4.0 - 10.5 K/uL   RBC 4.39 3.87 - 5.11 MIL/uL   Hemoglobin 12.1 12.0 - 15.0 g/dL   HCT 02/03/20 (L) 36 - 46 %   MCV 79.3 (L) 80.0 - 100.0 fL   MCH 27.6 26.0 - 34.0 pg   MCHC 34.8 30.0 - 36.0 g/dL   RDW 42.6 83.4 - 19.6 %   Platelets 217 150 - 400 K/uL   nRBC 0.0 0.0 - 0.2 %    Comment: Performed at [facility], [address], [place], [place]  Troponin I (High Sensitivity)     Status: Abnormal   Collection Time: 12/04/19  1:06 PM  Result Value Ref Range   Troponin I (High Sensitivity) 272 (HH) <18 ng/L    Comment: CRITICAL RESULT CALLED TO, READ BACK BY AND VERIFIED WITH: WEST,S. RN @1437  12/04/19 BILLINGSLEY,L (NOTE) Elevated high  sensitivity troponin I (hsTnI) values and significant  changes across serial measurements may suggest ACS but many other  chronic and acute conditions are known to elevate hsTnI results.  Refer to the Links section for chest pain algorithms and additional  guidance. Performed at [facility], [address] [place], [place]   hCG, quantitative, pregnancy  Status: Abnormal   Collection Time: 12/04/19  1:06 PM  Result Value Ref Range   hCG, Beta Chain, Quant, S 357,054 (H) <5 mIU/mL    Comment:          GEST. AGE      CONC.  (mIU/mL)   <=1 WEEK        5 - 50     2 WEEKS       50 - 500     3 WEEKS       100 - 10,000     4 WEEKS     1,000 - 30,000     5 WEEKS     3,500 - 115,000   6-8 WEEKS     12,000 - 270,000    12 WEEKS     15,000 - 220,000        FEMALE AND NON-PREGNANT FEMALE:     LESS THAN 5 mIU/mL RESULTS CONFIRMED BY MANUAL DILUTION Performed at [facility], [address] [place], [place] [address]   Troponin I (High Sensitivity)     Status: Abnormal   Collection Time: 12/04/19  3:06 PM  Result Value Ref Range   Troponin I (High Sensitivity) 1,185 (HH) <18 ng/L    Comment: CRITICAL RESULT CALLED TO, READ BACK BY AND VERIFIED WITH: SAVOIE,B. RN @1626  12/04/19 BILLINGSLEY,L DELTA CHECK NOTED (NOTE) Elevated high sensitivity troponin I (hsTnI) values and significant  changes across serial measurements may suggest ACS but many other  chronic and acute conditions are known to elevate hsTnI results.  Refer to the Links section for chest pain algorithms and additional  guidance. Performed at [facility], [address], [place], [place] [address]   Respiratory Panel by RT PCR (Flu A&B, Covid) - Nasopharyngeal Swab     Status: None   Collection Time: 12/04/19  5:58 PM   Specimen: Nasopharyngeal Swab  Result Value Ref Range   SARS Coronavirus 2 by RT PCR NEGATIVE NEGATIVE    Comment: (NOTE) SARS-CoV-2  target nucleic acids are NOT DETECTED.  The SARS-CoV-2 RNA is generally detectable in upper respiratoy specimens during the acute phase of infection. The lowest concentration of SARS-CoV-2 viral copies this assay can detect is 131 copies/mL. A negative result does not preclude SARS-Cov-2 infection and should not be used as the sole basis for treatment or other patient management decisions. A negative result may occur with  improper specimen collection/handling, submission of specimen other than nasopharyngeal swab, presence of viral mutation(s) within the areas targeted by this assay, and inadequate number of viral copies (<131 copies/mL). A negative result must be combined with clinical observations, patient history, and epidemiological information. The expected result is Negative.  Fact Sheet for Patients:  https://www.moore.com/  Fact Sheet for Healthcare Providers:  https://www.young.biz/  This test is no t yet approved or cleared by the [place] FDA and  has been authorized for detection and/or diagnosis of SARS-CoV-2 by FDA under an Emergency Use Authorization (EUA). This EUA will remain  in effect (meaning this test can be used) for the duration of the COVID-19 declaration under Section 564(b)(1) of the Act, 21 U.S.C. section 360bbb-3(b)(1), unless the authorization is terminated or revoked sooner.     Influenza A by PCR NEGATIVE NEGATIVE   Influenza B by PCR NEGATIVE NEGATIVE    Comment: (NOTE) The Xpert Xpress SARS-CoV-2/FLU/RSV assay is intended as an aid in  the diagnosis of influenza from Nasopharyngeal swab specimens and  should not be  used as a sole basis for treatment. Nasal washings and  aspirates are unacceptable for Xpert Xpress SARS-CoV-2/FLU/RSV  testing.  Fact Sheet for Patients: https://www.moore.com/  Fact Sheet for Healthcare Providers: https://www.young.biz/  This  test is not yet approved or cleared by the [place] FDA and  has been authorized for detection and/or diagnosis of SARS-CoV-2 by  FDA under an Emergency Use Authorization (EUA). This EUA will remain  in effect (meaning this test can be used) for the duration of the  Covid-19 declaration under Section 564(b)(1) of the Act, 21  U.S.C. section 360bbb-3(b)(1), unless the authorization is  terminated or revoked. Performed at [facility], [address], [place], [place] [address]   MRSA PCR Screening     Status: None   Collection Time: 12/04/19  7:12 PM   Specimen: Nasal Mucosa; Nasopharyngeal  Result Value Ref Range   MRSA by PCR NEGATIVE NEGATIVE    Comment:        The GeneXpert MRSA Assay (FDA approved for NASAL specimens only), is one component of a comprehensive MRSA colonization surveillance program. It is not intended to diagnose MRSA infection nor to guide or monitor treatment for MRSA infections. Performed at [facility], [address] [place], [place] [address]   Heparin level (unfractionated)     Status: Abnormal   Collection Time: 12/05/19 12:39 AM  Result Value Ref Range   Heparin Unfractionated 1.26 (H) 0.30 - 0.70 IU/mL    Comment: RESULTS CONFIRMED BY MANUAL DILUTION Performed at [facility], [address], [place], [place] [address]   Comprehensive metabolic panel     Status: Abnormal   Collection Time: 12/05/19 12:42 AM  Result Value Ref Range   Sodium 135 135 - 145 mmol/L   Potassium 3.8 3.5 - 5.1 mmol/L   Chloride 105 98 - 111 mmol/L   CO2 21 (L) 22 - 32 mmol/L   Glucose, Bld 91 70 - 99 mg/dL    Comment: Glucose reference range applies only to samples taken after fasting for at least 8 hours.   BUN 9 6 - 20 mg/dL   Creatinine, Ser 7.02 0.44 - 1.00 mg/dL   Calcium 8.5 (L) 8.9 - 10.3 mg/dL   Total Protein 6.4 (L) 6.5 - 8.1 g/dL   Albumin 3.5 3.5 - 5.0 g/dL   AST 21 15 - 41 U/L   ALT 12 0  - 44 U/L   Alkaline Phosphatase 38 38 - 126 U/L   Total Bilirubin 0.7 0.3 - 1.2 mg/dL   GFR calc non Af Amer >60 >60 mL/min   GFR calc Af Amer >60 >60 mL/min   Anion gap 9 5 - 15    Comment: Performed at [facility], [address] [place], [place] [address]  Urinalysis, Complete w Microscopic Urine, Clean Catch     Status: Abnormal   Collection Time: 12/05/19  2:55 AM  Result Value Ref Range   Color, Urine YELLOW YELLOW   APPearance HAZY (A) CLEAR   Specific Gravity, Urine >1.046 (H) 1.005 - 1.030   pH 5.0 5.0 - 8.0   Glucose, UA NEGATIVE NEGATIVE mg/dL   Hgb urine dipstick SMALL (A) NEGATIVE   Bilirubin Urine NEGATIVE NEGATIVE   Ketones, ur 80 (A) NEGATIVE mg/dL   Protein, ur NEGATIVE NEGATIVE mg/dL   Nitrite NEGATIVE NEGATIVE   Leukocytes,Ua LARGE (A) NEGATIVE   RBC / HPF 0-5 0 - 5 RBC/hpf   WBC, UA 0-5 0 -  5 WBC/hpf   Bacteria, UA RARE (A) NONE SEEN   Squamous Epithelial / LPF 6-10 0 - 5    Comment: Performed at [facility], [address] [place], [place] [address]  Troponin I (High Sensitivity)     Status: Abnormal   Collection Time: 12/05/19  7:38 AM  Result Value Ref Range   Troponin I (High Sensitivity) 616 (HH) <18 ng/L    Comment: DELTA CHECK NOTED CRITICAL VALUE NOTED.  VALUE IS CONSISTENT WITH PREVIOUSLY REPORTED AND CALLED VALUE. (NOTE) Elevated high sensitivity troponin I (hsTnI) values and significant  changes across serial measurements may suggest ACS but many other  chronic and acute conditions are known to elevate hsTnI results.  Refer to the Links section for chest pain algorithms and additional  guidance. Performed at [facility], [address], [place], [place] [address]     DG Chest 2 View  Result Date: 12/04/2019 CLINICAL DATA:  Chest pain. EXAM: CHEST - 2 VIEW COMPARISON:  10/02/2012 FINDINGS: The heart size and mediastinal contours are within normal limits. Both lungs are clear. No  evidence of pneumothorax or pleural effusion. The visualized skeletal structures are unremarkable. IMPRESSION: Normal study. Electronically Signed   By: Danae Orleans M.D.   On: 12/04/2019 13:30   CT Angio Chest PE W and/or Wo Contrast  Result Date: 12/04/2019 CLINICAL DATA:  Patient is pregnant. Chest pain, pressure, no shortness of breath or cough. EXAM: CT ANGIOGRAPHY CHEST WITH CONTRAST TECHNIQUE: Multidetector CT imaging of the chest was performed using the standard protocol during bolus administration of intravenous contrast. Multiplanar CT image reconstructions and MIPs were obtained to evaluate the vascular anatomy. CONTRAST:  OMNIPAQUE IOHEXOL 350 MG/ML SOLN COMPARISON:  CT chest 12/08/2017 FINDINGS: Cardiovascular: Satisfactory opacification of the pulmonary arteries to the segmental level. There is extensive bilateral nonocclusive and occlusive thrombus involving the right upper, right middle and right lower lobes as well as the left lingula and left lower lobe. Thrombus extends slightly into the left main pulmonary artery. The RV to LV ratio is 1.4 with evidence of right heart strain. Normal caliber of the main pulmonary artery and thoracic aorta. Mediastinum/Nodes: No enlarged mediastinal, hilar, or axillary lymph nodes. Thyroid gland, trachea, and esophagus demonstrate no significant findings. Lungs/Pleura: Lungs are clear. No pleural effusion or pneumothorax. Upper Abdomen: No acute abnormality. Musculoskeletal: No chest wall abnormality. No acute or significant osseous findings. Review of the MIP images confirms the above findings. IMPRESSION: Extensive bilateral nonocclusive and occlusive thrombus, as detailed above. Positive for acute PE with CT evidence of right heart strain (RV/LV Ratio = 1.4) consistent with at least submassive (intermediate risk) PE. The presence of right heart strain has been associated with an increased risk of morbidity and mortality. These results were called by  telephone at the time of interpretation on 12/04/2019 at 4:35 pm to provider [facility] , who verbally acknowledged these results. Electronically Signed   By: Emmaline Kluver M.D.   On: 12/04/2019 16:36   US OB Comp Less 14 Wks  Result Date: 12/04/2019 CLINICAL DATA:  Pregnancy, ICU patient EXAM: OBSTETRIC <14 WK ULTRASOUND TECHNIQUE: Transabdominal ultrasound was performed for evaluation of the gestation as well as the maternal uterus and adnexal regions. COMPARISON:  Obstetrical ultrasound 08/23/2019 FINDINGS: Intrauterine gestational sac: Single Yolk sac:  Visualized. Embryo:  Visualized. Cardiac Activity: Visualized. Heart Rate: 188 bpm CRL:   31.8 mm   10 w 0 d  [place] EDC: 07/01/2020 Subchorionic hemorrhage: Small volume mixed hypoechoic to anechoic subchorionic hemorrhage is seen. Maternal uterus/adnexae: Normal anteverted uterus. No other focal uterine abnormalities. No concerning adnexal lesions. No free fluid in the pelvis. IMPRESSION: Single intrauterine gestation with a gestational age of [redacted] weeks, 0 days by crown-rump length sonographic estimation. Small volume subchorionic hemorrhage as well as borderline fetal tachycardia. Recommend correlation with clinical findings and obstetrical consultation if not previously obtained. Electronically Signed   By: Kreg Shropshire M.D.   On: 12/04/2019 22:00   ECHOCARDIOGRAM COMPLETE  Result Date: 12/05/2019    ECHOCARDIOGRAM REPORT   Patient Name:   [facility] Date of Exam: 12/05/2019 Medical Rec #:  [id number]        Height:       67.0 in Accession #:    [id number]       Weight:       138.0 lb Date of Birth:  12-01-1990        BSA:          1.727 m Patient Age:    29 years         BP:           96/59 mmHg Patient Gender: F                HR:           84 bpm. Exam Location:  Inpatient Procedure: 2D Echo, Cardiac Doppler and Color Doppler Indications:    Pulmonary Embolus 415.19 / I26.99  History:        Patient has no prior history of  Echocardiogram examinations.                 Risk Factors:Former Smoker. GERD. Pregnant.  Sonographer:    Renella Cunas RDCS Referring Phys: [id number] [address] Bong  Sonographer Comments: Image acquisition challenging due to patient body habitus. IMPRESSIONS  1. Left ventricular ejection fraction, by estimation, is 60 to 65%. The left ventricle has normal function. Left ventricular endocardial border not optimally defined to evaluate regional wall motion. Left ventricular diastolic parameters were normal. There is the interventricular septum is flattened in systole, consistent with right ventricular pressure overload.  2. Right ventricular systolic function is severely reduced. The right ventricular size is mildly enlarged. There is normal pulmonary artery systolic pressure.  3. Moderate pericardial effusion. The pericardial effusion is surrounding the apex. There is no evidence of cardiac tamponade.  4. The mitral valve is normal in structure. No evidence of mitral valve regurgitation. No evidence of mitral stenosis.  5. The aortic valve was not well visualized. Aortic valve regurgitation is not visualized. No aortic stenosis is present.  6. The inferior vena cava is normal in size with <50% respiratory variability, suggesting right atrial pressure of 8 mmHg. FINDINGS  Left Ventricle: Left ventricular ejection fraction, by estimation, is 60 to 65%. The left ventricle has normal function. Left ventricular endocardial border not optimally defined to evaluate regional wall motion. The left ventricular internal cavity size was normal in size. There is no left ventricular hypertrophy. The interventricular septum is flattened in systole, consistent with right ventricular pressure overload. Left ventricular diastolic parameters were normal. Normal left ventricular filling pressure. Right Ventricle: The right ventricular size is mildly enlarged. No increase in right ventricular wall thickness. Right ventricular systolic  function is severely reduced. There is normal pulmonary artery systolic pressure. The tricuspid regurgitant velocity is 2.34 m/s, and with an assumed right atrial pressure of 8 mmHg, the  estimated right ventricular systolic pressure is 29.9 mmHg. Left Atrium: Left atrial size was normal in size. Right Atrium: Right atrial size was normal in size. Pericardium: A moderately sized pericardial effusion is present. The pericardial effusion is surrounding the apex. There is no evidence of cardiac tamponade. Mitral Valve: The mitral valve is normal in structure. No evidence of mitral valve regurgitation. No evidence of mitral valve stenosis. Tricuspid Valve: The tricuspid valve is normal in structure. Tricuspid valve regurgitation is trivial. No evidence of tricuspid stenosis. Aortic Valve: The aortic valve was not well visualized. Aortic valve regurgitation is not visualized. No aortic stenosis is present. Pulmonic Valve: The pulmonic valve was normal in structure. Pulmonic valve regurgitation is not visualized. No evidence of pulmonic stenosis. Aorta: The aortic root is normal in size and structure. Venous: The inferior vena cava is normal in size with less than 50% respiratory variability, suggesting right atrial pressure of 8 mmHg. IAS/Shunts: No atrial level shunt detected by color flow Doppler.  LEFT VENTRICLE PLAX 2D LVIDd:         3.20 cm  Diastology LVIDs:         2.50 cm  LV e' medial:    7.51 cm/s LV PW:         0.70 cm  LV E/e' medial:  6.3 LV IVS:        0.70 cm  LV e' lateral:   9.79 cm/s LVOT diam:     1.70 cm  LV E/e' lateral: 4.8 LV SV:         28 LV SV Index:   16 LVOT Area:     2.27 cm  RIGHT VENTRICLE RV S prime:     8.55 cm/s TAPSE (M-mode): 1.5 cm LEFT ATRIUM             Index      RIGHT ATRIUM           Index LA diam:        1.20 cm 0.69 cm/m RA Area:     10.90 cm LA Vol (A2C):   9.2 ml  5.34 ml/m RA Volume:   24.30 ml  14.07 ml/m LA Vol (A4C):   7.8 ml  4.51 ml/m LA Biplane Vol: 9.0 ml  5.20  ml/m  AORTIC VALVE LVOT Vmax:   73.20 cm/s LVOT Vmean:  51.900 cm/s LVOT VTI:    0.122 m  AORTA Ao Root diam: 2.30 cm MITRAL VALVE               TRICUSPID VALVE MV Area (PHT): 3.99 cm    TR Peak grad:   21.9 mmHg MV Decel Time: 190 msec    TR Vmax:        234.00 cm/s MV E velocity: 47.40 cm/s MV A velocity: 35.50 cm/s  SHUNTS MV E/A ratio:  1.34        Systemic VTI:  0.12 m                            Systemic Diam: 1.70 cm Armanda Magicraci Turner MD Electronically signed by Armanda Magicraci Turner MD Signature Date/Time: 12/05/2019/10:31:55 AM    Final     Assessment/Plan: 29 year old G6P3 at 10 weeks by ultrasound with submassive pulmonary embolism and secondary hypotension and right heart strain.   Discussed case with IR physician and maternal health supercedes an early non-viable pregnancy. Patient has 3 children at home. Discussed importance of moving forward with  catheter directed thrombolysis using TPA.  Discussed that if patient has signs of a miscarriage, can deal with it at [facility] if needed. Echocardiogram done and pericardial effusion seen with right ventricular systolic function severely reduced.  Appreciate care of Critical Care Team. Will continue to follow.  Lower extremity dopplers done while I was at bedside, prelim wet read was negative.  Discussed with patient that once she recovers we will discuss non-hormonal contraception versus permanent sterilization.   We will sign off for now but if there are any obstetrical issues in the near future, will manage at that time.   Naoma Diener Mohmmad Saleeby 12/05/2019

## 2019-12-05 NOTE — Progress Notes (Signed)
eLink Physician-Brief Progress Note Patient Name: Tasha Wu DOB: 12-07-1990 MRN: [id number]   Date of Service  12/05/2019  HPI/Events of Note  Called for onset of chest discomfort and low back pain in this patient who has tPA and heparin infusing for CDT for her bilateral PE with right heart strain. Also with new frequent PVCs on monitor. Some mild oozing at femoral catheter site. Vitals stable (HR 80-100), normotensive not on pressors. However, tachypneic. This appears to be an acute change -- she was previously tolerating the CDT infusion.   eICU Interventions  I spoke with the IR attending about her current state. We agreed that the new pain (especially low back pain in setting of known pregnancy) warrants consideration for possible bleeding. We agreed to pause the tPA infusion (continuing the heparin infusion) temporarily while conducting further assessment.  STAT CBC, coags, fibrinogen sent.  I also called OB and discussed her situation. They advised that an ultrasound to evaluate for bleeding was not needed -- bleeding related to her pregnancy would always present overtly.   Plan of care decided upon was to f/u coags/fibrinogen to rule out an excessive coagulopathy from tPA infusion, then resume tPA if this is not present. Dr. Nicole Wu now at bedside to facilitate this plan of care.     Intervention Category Major Interventions: Hemorrhage - evaluation and management  Tasha Wu Tasha Wu 12/05/2019, 7:30 PM

## 2019-12-05 NOTE — Procedures (Signed)
Central Venous Catheter Insertion Procedure Note  Ceasia Elwell  [id number]  07/08/1990  Date:12/05/19  Time:6:34 AM   Provider Performing:Aylissa Heinemann Judie Petit Thora Lance   Procedure: Insertion of Non-tunneled Central Venous [phone]) with US guidance ([id number])   Indication(s) Hypotension  Consent Risks of the procedure as well as the alternatives and risks of each were explained to the patient and/or caregiver.  Consent for the procedure was obtained and is signed in the bedside chart  Anesthesia Topical only with 1% lidocaine   Timeout Verified patient identification, verified procedure, site/side was marked, verified correct patient position, special equipment/implants available, medications/allergies/relevant history reviewed, required imaging and test results available.  Sterile Technique Maximal sterile technique including full sterile barrier drape, hand hygiene, sterile gown, sterile gloves, mask, hair covering, sterile ultrasound probe cover (if used).  Procedure Description Area of catheter insertion was cleaned with chlorhexidine and draped in sterile fashion.  With real-time ultrasound guidance a central venous catheter was placed into the right femoral vein. Nonpulsatile blood flow and easy flushing noted in all ports.  The catheter was sutured in place and sterile dressing applied.  Complications/Tolerance None; patient tolerated the procedure well. Chest x-ray is not ordered for femoral cannulation.  EBL Minimal  Specimen(s) None

## 2019-12-06 ENCOUNTER — Inpatient Hospital Stay (HOSPITAL_COMMUNITY): Payer: POS

## 2019-12-06 HISTORY — PX: IR THROMB F/U EVAL ART/VEN FINAL DAY (MS): IMG5379

## 2019-12-06 LAB — CBC
HCT: 25.3 % — ABNORMAL LOW (ref 36.0–46.0)
HCT: 24.7 % — ABNORMAL LOW (ref 36.0–46.0)
Hemoglobin: 8.6 g/dL — ABNORMAL LOW (ref 12.0–15.0)
Hemoglobin: 8.6 g/dL — ABNORMAL LOW (ref 12.0–15.0)
MCH: 27.2 pg (ref 26.0–34.0)
MCH: 27.5 pg (ref 26.0–34.0)
MCHC: 34 g/dL (ref 30.0–36.0)
MCHC: 34.8 g/dL (ref 30.0–36.0)
MCV: 80.1 fL (ref 80.0–100.0)
MCV: 78.9 fL — ABNORMAL LOW (ref 80.0–100.0)
Platelets: 145 10*3/uL — ABNORMAL LOW (ref 150–400)
Platelets: 147 10*3/uL — ABNORMAL LOW (ref 150–400)
RBC: 3.16 MIL/uL — ABNORMAL LOW (ref 3.87–5.11)
RBC: 3.13 MIL/uL — ABNORMAL LOW (ref 3.87–5.11)
RDW: 13.2 % (ref 11.5–15.5)
RDW: 13.2 % (ref 11.5–15.5)
WBC: 7 10*3/uL (ref 4.0–10.5)
WBC: 6.7 10*3/uL (ref 4.0–10.5)
nRBC: 0 % (ref 0.0–0.2)
nRBC: 0 % (ref 0.0–0.2)

## 2019-12-06 LAB — HEPARIN LEVEL (UNFRACTIONATED)
Heparin Unfractionated: 0.48 IU/mL (ref 0.30–0.70)
Heparin Unfractionated: 0.49 IU/mL (ref 0.30–0.70)
Heparin Unfractionated: 0.4 IU/mL (ref 0.30–0.70)

## 2019-12-06 LAB — MAGNESIUM: Magnesium: 1.7 mg/dL (ref 1.7–2.4)

## 2019-12-06 LAB — URINE CULTURE: Culture: >=100000 — AB

## 2019-12-06 LAB — FIBRINOGEN
Fibrinogen: 272 mg/dL (ref 210–475)
Fibrinogen: 310 mg/dL (ref 210–475)

## 2019-12-06 LAB — BASIC METABOLIC PANEL
Anion gap: 5 (ref 5–15)
BUN: 6 mg/dL (ref 6–20)
CO2: 19 mmol/L — ABNORMAL LOW (ref 22–32)
Calcium: 7.6 mg/dL — ABNORMAL LOW (ref 8.9–10.3)
Chloride: 111 mmol/L (ref 98–111)
Creatinine, Ser: 0.69 mg/dL (ref 0.44–1.00)
GFR calc Af Amer: >60 mL/min (ref >60)
GFR calc non Af Amer: >60 mL/min (ref >60)
Glucose, Bld: 83 mg/dL (ref 70–99)
Potassium: 3.7 mmol/L (ref 3.5–5.1)
Sodium: 135 mmol/L (ref 135–145)

## 2019-12-06 LAB — PHOSPHORUS: Phosphorus: 2.9 mg/dL (ref 2.5–4.6)

## 2019-12-06 LAB — HEMOGLOBIN AND HEMATOCRIT, BLOOD
HCT: 24.3 % — ABNORMAL LOW (ref 36.0–46.0)
Hemoglobin: 8.4 g/dL — ABNORMAL LOW (ref 12.0–15.0)

## 2019-12-06 MED ORDER — POTASSIUM CHLORIDE CRYS ER 20 MEQ PO TBCR
40.0000 meq | EXTENDED_RELEASE_TABLET | Freq: Once | ORAL | Status: AC
  Administered 2019-12-06: 40 meq via ORAL
  Filled 2019-12-06: qty 2

## 2019-12-06 MED ORDER — MAGNESIUM SULFATE 4 GM/100ML IV SOLN
4.0000 g | Freq: Once | INTRAVENOUS | Status: AC
  Administered 2019-12-06: 4 g via INTRAVENOUS
  Filled 2019-12-06: qty 100

## 2019-12-06 NOTE — Progress Notes (Signed)
PE lysis complete.  PA pressures: Initial - 34/14 (25) Final - 23/6 (13)  Left groin sheath removed in tact without complication at 0856, hemostasis achieved 0902 via manual pressure. Quickclot + 4x4 gauze + tegaderm applied.  Bedrest x 2 hours, may sit up, may bed LLE. After bedrest complete ambulate as tolerated. Dressing changes QD until healed, if bleeding recurs lay patient flat and hold firm pressure at site x 10 minutes - repeat PRN.  Please call IR with questions or concerns.   Lynnette Caffey, PA-C

## 2019-12-06 NOTE — Progress Notes (Addendum)
ANTICOAGULATION CONSULT NOTE  Pharmacy Consult for IV heparin Indication: PE; pregnant  No Known Allergies  Patient Measurements: Height: 5\' 7"  (170.2 cm) Weight: 62.6 kg (138 lb 0.1 oz) IBW/kg (Calculated) : 61.6 Heparin Dosing Weight: TBW  Vital Signs: Temp: 97.9 F (36.6 C) (10/03 0400) Temp Source: Oral (10/03 0400) Pulse Rate: 67 (10/03 0400)  Labs: Recent Labs    12/04/19 1306 12/04/19 1306 12/04/19 1506 12/05/19 0039 12/05/19 0042 12/05/19 0738 12/05/19 1042 12/05/19 1128 12/05/19 1128 12/05/19 1850 12/05/19 1920 12/05/19 1936 12/05/19 2224 12/05/19 2224 12/06/19 0009 12/06/19 0115 12/06/19 0320  HGB 12.1   < >  --   --   --   --   --  9.8*   < >  --  9.8*   < > 9.3*   < > 8.4* 8.6*  --   HCT 34.8*   < >  --   --   --   --   --  28.3*   < >  --  28.0*   < > 27.7*  --  24.3* 25.3*  --   PLT 217   < >  --   --   --   --   --  179  --   --  162  --   --   --   --  145*  --   HEPARINUNFRC  --   --   --    < >  --   --  1.06*  --   --  0.73*  --   --   --   --   --   --  0.48  CREATININE 0.86  --   --   --  0.80  --   --   --   --   --  0.76  --   --   --   --   --   --   TROPONINIHS 272*  --  1,185*  --   --  616*  --   --   --   --   --   --   --   --   --   --   --    < > = values in this interval not displayed.    Estimated Creatinine Clearance: 100 mL/min (by C-G formula based on SCr of 0.76 mg/dL).   Assessment: 89 yoF with PMH sickle cell trait, currently [redacted] wks pregnant, presents with sudden onset SOB and LOC earlier today. Found to have extensive bilat PE with RH strain present; troponins elevated; BNP not drawn. Pharmacy to dose IV heparin.    Baseline INR, aPTT: not done  Prior anticoagulation: none  Significant events:  Today, 12/06/2019: 4:13 AM   Heparin level = 0.48 therapeutic with heparin @ 850 units/hr  Hgb down slightly  Mild oozing noted during night and tPA infusion held temporarily.  Subsequent hemoglobin relative unchanged  therefore tPA resumed.  AM Fibrinogen pending  Concurrent B/L TPA over 12 hours   Goal of Therapy: Heparin level 0.3-0.7 units/ml Monitor platelets by anticoagulation protocol: Yes  Plan:  Continue heparin @ 850 units/hr  Check heparin level, CBC, and fibrinogen q 6 h while infusing alteplase and post infusion  Monitor for signs of bleeding or thrombosis  02/05/2020, PharmD 12/06/2019 4:13 AM

## 2019-12-06 NOTE — Progress Notes (Signed)
ANTICOAGULATION CONSULT NOTE  Pharmacy Consult for IV heparin Indication: PE; pregnant  No Known Allergies  Patient Measurements: Height: 5\' 7"  (170.2 cm) Weight: 64.8 kg (142 lb 13.7 oz) IBW/kg (Calculated) : 61.6 Heparin Dosing Weight: TBW  Vital Signs: Temp: 98 F (36.7 C) (10/03 1200) Temp Source: Oral (10/03 1200) BP: 110/62 (10/03 1000) Pulse Rate: 82 (10/03 1600)  Labs: Recent Labs     0000 12/04/19 1306 12/04/19 1506 12/05/19 0039 12/05/19 0042 12/05/19 0738 12/05/19 1042 12/05/19 1850 12/05/19 1920 12/05/19 1936 12/06/19 0009 12/06/19 0009 12/06/19 0115 12/06/19 0320 12/06/19 0459 12/06/19 0900 12/06/19 1320 12/06/19 1511  HGB  --  12.1  --   --   --   --    < >  --  9.8*   < > 8.4*   < > 8.6*  --   --   --  8.6*  --   HCT  --  34.8*  --   --   --   --    < >  --  28.0*   < > 24.3*  --  25.3*  --   --   --  24.7*  --   PLT  --  217  --   --   --   --    < >  --  162  --   --   --  145*  --   --   --  147*  --   HEPARINUNFRC  --   --   --    < >  --   --    < >   < >  --   --   --   --   --  0.48  --  0.49  --  0.40  CREATININE   < > 0.86  --   --  0.80  --   --   --  0.76  --   --   --   --   --  0.69  --   --   --   TROPONINIHS  --  272* 1,185*  --   --  616*  --   --   --   --   --   --   --   --   --   --   --   --    < > = values in this interval not displayed.    Estimated Creatinine Clearance: 100 mL/min (by C-G formula based on SCr of 0.69 mg/dL).   Assessment: 58 yoF with PMH sickle cell trait, currently [redacted] wks pregnant, presents with sudden onset SOB and LOC earlier today. Found to have extensive bilat PE with RH strain present; troponins elevated; BNP not drawn. Pharmacy to dose IV heparin.    Baseline INR, aPTT: not done  Prior anticoagulation: none  Significant events:  Today, 12/06/2019: 4:32 PM   Heparin level = 0.40 therapeutic with heparin @ 850 units/hr  Concurrent B/L TPA over 12 hours completed this AM & L groin sheath  removed & hemostasis achieved per IR note  Hg 8.6 fairly stable, PLTC remains WNL  Fibrinogen 272 x 2 measures  No more bleeding/oozing reported  Goal of Therapy: Heparin level 0.3-0.7 units/ml Monitor platelets by anticoagulation protocol: Yes  Plan:  Continue heparin @ 850 units/hr  Check heparin level again in 6 hrs per IR protocol then change to daily heparin level & CBC infusing alteplase and post infusion  Monitor for signs of bleeding  or thrombosis  F/u for transition to [facility]  Luisa Hart, PharmD, BCPS [phone] 12/06/2019 4:32 PM

## 2019-12-06 NOTE — Progress Notes (Signed)
NAME:  Tasha Wu, MRN:  [id number], DOB:  [birth year], LOS: 2 ADMISSION DATE:  12/04/2019, CONSULTATION DATE: 12/04/2019 REFERRING MD: Dr. Bernette Mayers, CHIEF COMPLAINT: Chest pain, shortness of breath,  Brief History   [redacted] weeks pregnant, submassive PE, hypotension overnight  History of present illness   Noted to have bilateral PEs with right heart strain Post TPA which he tolerated okay did have some episodes with PVCs and chest discomfort  Past Medical History   Past Medical History:  Diagnosis Date  . Abnormal Pap smear 12/10/2011  . Anemia   . Anxiety 2010   anxiety attack;no meds;has not been dx'd  . Emotional instability (HCC)    Break down easily when things are not going her way  . GERD (gastroesophageal reflux disease)   . Hx of joint problems    Shoulders and knees have pain  . Infection    Yeast;would get frequently  . Infection    BV x 1  . Kidney infection 2010   PO antibxs  . Kidney stone complicating pregnancy   . Migraines    no rx'd meds in the past;goes to sleep  . PCOS (polycystic ovarian syndrome)   . Sickle cell trait (HCC)   . Sickle cell trait (HCC)   . Umbilical hernia 1991   Does not cause anyp problems  . Vaginal Pap smear, abnormal      Significant Hospital Events   Hypotension 12/05/2019  Consults:  PCCM Interventional radiology  Procedures:  Arterial line placement 10/2 Central venous access 10/2 Thrombolysis 10/2  Significant Diagnostic Tests:  CT scan of the chest showing bilateral PE with extensive clot burden Elevated troponins  Micro Data:  None  Antimicrobials:  None  Interim history/subjective:  Feels better this morning did have PVCs and chest discomfort last evening Labs have remained unremarkable  Objective   Blood pressure 110/62, pulse 73, temperature 98.3 F (36.8 C), temperature source Oral, resp. rate 17, height 5\' 7"  (1.702 m), weight 64.8 kg, last menstrual period 09/24/2019, SpO2 100 %, unknown if  currently breastfeeding.        Intake/Output Summary (Last 24 hours) at 12/06/2019 1038 Last data filed at 12/06/2019 1000 Gross per 24 hour  Intake 2627.31 ml  Output 1950 ml  Net 677.31 ml   Filed Weights   12/04/19 1301 12/05/19 0500 12/06/19 0459  Weight: 63.5 kg 62.6 kg 64.8 kg    Examination: Middle-aged Moist oral mucosa Clear breath sounds S1-S2 appreciated Bowel sounds appreciated   Resolved Hospital Problem list     Assessment & Plan:  Submassive pulmonary embolism -S/p thrombolytics -Pain and discomfort symptoms are well controlled this morning -Saturating well -She removed this morning -Appreciate interventional radiology assistance  Hemodynamics stable at present  Sickle cell trait  High risk pregnancy  Has no overt bleeding, H&H stable  Plan will be to transition to Lovenox for management of PE through the pregnancy  Best practice:  Diet: Regular diet Pain/Anxiety/Delirium protocol (if indicated): Morphine as needed, Tylenol as needed VAP protocol (if indicated): Not indicated DVT prophylaxis: On full dose anticoagulation with heparin GI prophylaxis: Pepcid Glucose control: Not indicated Mobility: Bedrest Code Status: Full code Family Communication: Patient fully aware Disposition: ICU  Labs   CBC: Recent Labs  Lab 12/04/19 1306 12/04/19 1306 12/05/19 1128 12/05/19 1128 12/05/19 1920 12/05/19 1936 12/05/19 2224 12/06/19 0009 12/06/19 0115  WBC 6.7  --  5.8  --  6.8  --   --   --  7.0  HGB  12.1   < > 9.8*   < > 9.8* 8.5* 9.3* 8.4* 8.6*  HCT 34.8*   < > 28.3*   < > 28.0* 25.0* 27.7* 24.3* 25.3*  MCV 79.3*  --  79.3*  --  78.9*  --   --   --  80.1  PLT 217  --  179  --  162  --   --   --  145*   < > = values in this interval not displayed.    Basic Metabolic Panel: Recent Labs  Lab 12/04/19 1306 12/05/19 0042 12/05/19 1920 12/05/19 1936 12/05/19 1949 12/06/19 0459  NA 134* 135 136 136  --  135  K 3.3* 3.8 3.5 3.4*  --   3.7  CL 103 105 109  --   --  111  CO2 22 21* 21*  --   --  19*  GLUCOSE 112* 91 96  --   --  83  BUN 11 9 7   --   --  6  CREATININE 0.86 0.80 0.76  --   --  0.69  CALCIUM 8.9 8.5* 8.4*  --   --  7.6*  MG  --   --   --   --  2.2 1.7  PHOS  --   --   --   --   --  2.9   GFR: Estimated Creatinine Clearance: 100 mL/min (by C-G formula based on SCr of 0.69 mg/dL). Recent Labs  Lab 12/04/19 1306 12/05/19 1128 12/05/19 1920 12/06/19 0115  WBC 6.7 5.8 6.8 7.0    Liver Function Tests: Recent Labs  Lab 12/05/19 0042  AST 21  ALT 12  ALKPHOS 38  BILITOT 0.7  PROT 6.4*  ALBUMIN 3.5   No results for input(s): LIPASE, AMYLASE in the last 168 hours. No results for input(s): AMMONIA in the last 168 hours.  ABG    Component Value Date/Time   PHART 7.460 (H) 12/05/2019 1936   PCO2ART 26.2 (L) 12/05/2019 1936   PO2ART 170 (H) 12/05/2019 1936   HCO3 18.5 (L) 12/05/2019 1936   TCO2 19 (L) 12/05/2019 1936   ACIDBASEDEF 4.0 (H) 12/05/2019 1936   O2SAT 100.0 12/05/2019 1936     Coagulation Profile: No results for input(s): INR, PROTIME in the last 168 hours.  Cardiac Enzymes: No results for input(s): CKTOTAL, CKMB, CKMBINDEX, TROPONINI in the last 168 hours.  HbA1C: No results found for: HGBA1C  CBG: No results for input(s): GLUCAP in the last 168 hours.  Review of Systems:   Occasional chest discomfort  Past Medical History  She,  has a past medical history of Abnormal Pap smear (12/10/2011), Anemia, Anxiety (2010), Emotional instability (HCC), GERD (gastroesophageal reflux disease), joint problems, Infection, Infection, Kidney infection (2010), Kidney stone complicating pregnancy, Migraines, PCOS (polycystic ovarian syndrome), Sickle cell trait (HCC), Sickle cell trait (HCC), Umbilical hernia (1991), and Vaginal Pap smear, abnormal.   Surgical History    Past Surgical History:  Procedure Laterality Date  . DILATION AND CURETTAGE OF UTERUS  2009   No complications  .  IR ANGIOGRAM SELECTIVE EACH ADDITIONAL VESSEL  12/05/2019  . IR ANGIOGRAM SELECTIVE EACH ADDITIONAL VESSEL  12/05/2019  . IR INFUSION THROMBOL ARTERIAL INITIAL (MS)  12/05/2019  . IR 02/04/2020 GUIDE VASC ACCESS LEFT  12/05/2019  . IR 02/04/2020 GUIDE VASC ACCESS LEFT  12/05/2019  . WISDOM TOOTH EXTRACTION       Social History   reports that she quit smoking about 8 years ago.  Her smoking use included cigarettes. She smoked 0.25 packs per day. She has never used smokeless tobacco. She reports previous alcohol use. She reports that she does not use drugs.   Family History   Her family history includes Anemia in her mother; Arthritis in her paternal grandmother; Asthma in her sister; Diabetes in her maternal aunt and paternal grandfather; Diabetes type I in her cousin; Heart disease in her maternal aunt; Hernia in her maternal aunt; Hypertension in her maternal uncle and mother; Hyperthyroidism in her paternal grandmother; Migraines in her father; Other in her father; Scoliosis in her paternal grandmother; Sickle cell anemia in her maternal aunt and maternal grandmother; Sickle cell trait in her brother and mother.   Allergies No Known Allergies   The patient is critically ill with multiple organ systems failure and requires high complexity decision making for assessment and support, frequent evaluation and titration of therapies, application of advanced monitoring technologies and extensive interpretation of multiple databases. Critical Care Time devoted to patient care services described in this note independent of APP/resident time (if applicable)  is 32 minutes.   Virl Diamond MD [facility] Personal pager: [phone] If unanswered, please page CCM On-call: #[phone]

## 2019-12-06 NOTE — Progress Notes (Addendum)
eLink Physician-Brief Progress Note Patient Name: Tasha Wu DOB: 08/17/1990 MRN: [id number]   Date of Service  12/06/2019  HPI/Events of Note  Headache. Already just got tylenol. Has history of migraines. NO change in neuro status. Has been on iv heparin (also got TPA yesterday)  eICU Interventions  Observe response to tylenol If headache continues, may need to have ct head done  D/w RN      Intervention Category Major Interventions: Other:  Oretha Milch 12/06/2019, 10:44 PM   12:35 am Mild hypotension with SBP 88 Asleep LR bolus x 1

## 2019-12-06 NOTE — Progress Notes (Addendum)
ANTICOAGULATION CONSULT NOTE  Pharmacy Consult for IV heparin Indication: PE; pregnant  No Known Allergies  Patient Measurements: Height: 5\' 7"  (170.2 cm) Weight: 64.8 kg (142 lb 13.7 oz) IBW/kg (Calculated) : 61.6 Heparin Dosing Weight: TBW  Vital Signs: Temp: 98.3 F (36.8 C) (10/03 0800) Temp Source: Oral (10/03 0800) BP: 110/62 (10/03 1000) Pulse Rate: 73 (10/03 1000)  Labs: Recent Labs     0000 12/04/19 1306 12/04/19 1506 12/05/19 0039 12/05/19 0042 12/05/19 0738 12/05/19 1042 12/05/19 1128 12/05/19 1128 12/05/19 1850 12/05/19 1920 12/05/19 1936 12/05/19 2224 12/05/19 2224 12/06/19 0009 12/06/19 0115 12/06/19 0320 12/06/19 0459 12/06/19 0900  HGB   < > 12.1  --   --   --   --   --  9.8*   < >  --  9.8*   < > 9.3*   < > 8.4* 8.6*  --   --   --   HCT   < > 34.8*  --   --   --   --   --  28.3*   < >  --  28.0*   < > 27.7*  --  24.3* 25.3*  --   --   --   PLT   < > 217  --   --   --   --   --  179  --   --  162  --   --   --   --  145*  --   --   --   HEPARINUNFRC  --   --   --    < >  --   --    < >  --   --  0.73*  --   --   --   --   --   --  0.48  --  0.49  CREATININE   < > 0.86  --   --  0.80  --   --   --   --   --  0.76  --   --   --   --   --   --  0.69  --   TROPONINIHS  --  272* 1,185*  --   --  616*  --   --   --   --   --   --   --   --   --   --   --   --   --    < > = values in this interval not displayed.    Estimated Creatinine Clearance: 100 mL/min (by C-G formula based on SCr of 0.69 mg/dL).   Assessment: 77 yoF with PMH sickle cell trait, currently [redacted] wks pregnant, presents with sudden onset SOB and LOC earlier today. Found to have extensive bilat PE with RH strain present; troponins elevated; BNP not drawn. Pharmacy to dose IV heparin.    Baseline INR, aPTT: not done  Prior anticoagulation: none  Significant events:  Today, 12/06/2019: 10:56 AM   Heparin level = 0.49 therapeutic with heparin @ 850 units/hr  Concurrent B/L TPA over  12 hours completed this AM & L groin sheath removed & hemostasis achieved per IR note  Hg 8.6 fairly stable, PLTC remains WNL  Fibrinogen 272 x 2 measures  No more bleeding/oozing reported  Goal of Therapy: Heparin level 0.3-0.7 units/ml Monitor platelets by anticoagulation protocol: Yes  Plan:  Continue heparin @ 850 units/hr  Check heparin level again in 6 hrs per IR protocol then change  to daily heparin level & CBC infusing alteplase and post infusion  Monitor for signs of bleeding or thrombosis  F/u for transition to full dose LMWH (DOAC contraindicated in pregnancy)  Herby Abraham, Pharm.D 12/06/2019 11:02 AM

## 2019-12-07 LAB — BASIC METABOLIC PANEL
Anion gap: 6 (ref 5–15)
BUN: <5 mg/dL — ABNORMAL LOW (ref 6–20)
CO2: 20 mmol/L — ABNORMAL LOW (ref 22–32)
Calcium: 7.7 mg/dL — ABNORMAL LOW (ref 8.9–10.3)
Chloride: 109 mmol/L (ref 98–111)
Creatinine, Ser: 0.63 mg/dL (ref 0.44–1.00)
GFR calc Af Amer: >60 mL/min (ref >60)
GFR calc non Af Amer: >60 mL/min (ref >60)
Glucose, Bld: 81 mg/dL (ref 70–99)
Potassium: 3.7 mmol/L (ref 3.5–5.1)
Sodium: 135 mmol/L (ref 135–145)

## 2019-12-07 LAB — CBC
HCT: 22.2 % — ABNORMAL LOW (ref 36.0–46.0)
Hemoglobin: 7.9 g/dL — ABNORMAL LOW (ref 12.0–15.0)
MCH: 28.1 pg (ref 26.0–34.0)
MCHC: 35.6 g/dL (ref 30.0–36.0)
MCV: 79 fL — ABNORMAL LOW (ref 80.0–100.0)
Platelets: 146 10*3/uL — ABNORMAL LOW (ref 150–400)
RBC: 2.81 MIL/uL — ABNORMAL LOW (ref 3.87–5.11)
RDW: 13.1 % (ref 11.5–15.5)
WBC: 5.4 10*3/uL (ref 4.0–10.5)
nRBC: 0 % (ref 0.0–0.2)

## 2019-12-07 LAB — HEPARIN LEVEL (UNFRACTIONATED): Heparin Unfractionated: 0.27 IU/mL — ABNORMAL LOW (ref 0.30–0.70)

## 2019-12-07 LAB — HEMOGLOBIN AND HEMATOCRIT, BLOOD
HCT: 23.8 % — ABNORMAL LOW (ref 36.0–46.0)
Hemoglobin: 8.3 g/dL — ABNORMAL LOW (ref 12.0–15.0)

## 2019-12-07 LAB — MAGNESIUM: Magnesium: 1.9 mg/dL (ref 1.7–2.4)

## 2019-12-07 LAB — GLUCOSE, CAPILLARY: Glucose-Capillary: 79 mg/dL (ref 70–99)

## 2019-12-07 MED ORDER — SIMETHICONE 80 MG PO CHEW: 160.0000 mg | CHEWABLE_TABLET | Freq: Four times a day (QID) | ORAL | Status: DC

## 2019-12-07 MED ORDER — HEPARIN (PORCINE) 25000 UT/250ML-% IV SOLN: 1000.0000 [IU]/h | INTRAVENOUS | Status: DC

## 2019-12-07 MED ORDER — MAGNESIUM SULFATE 2 GM/50ML IV SOLN
2.0000 g | Freq: Once | INTRAVENOUS | Status: AC
  Administered 2019-12-07: 2 g via INTRAVENOUS
  Filled 2019-12-07: qty 50

## 2019-12-07 MED ORDER — ENOXAPARIN SODIUM 100 MG/ML ~~LOC~~ SOLN
100.0000 mg | SUBCUTANEOUS | Status: DC
  Filled 2019-12-07: qty 1

## 2019-12-07 MED ORDER — SIMETHICONE 80 MG PO CHEW
160.0000 mg | CHEWABLE_TABLET | Freq: Three times a day (TID) | ORAL | Status: DC
  Administered 2019-12-07 – 2019-12-09 (×6): 160 mg via ORAL
  Filled 2019-12-07 (×6): qty 2

## 2019-12-07 MED ORDER — ENOXAPARIN SODIUM 80 MG/0.8ML ~~LOC~~ SOLN
70.0000 mg | Freq: Two times a day (BID) | SUBCUTANEOUS | Status: DC
  Administered 2019-12-07 – 2019-12-09 (×5): 70 mg via SUBCUTANEOUS
  Filled 2019-12-07 (×2): qty 0.8
  Filled 2019-12-07 (×4): qty 0.7

## 2019-12-07 MED ORDER — ENOXAPARIN (LOVENOX) PATIENT EDUCATION KIT
PACK | Freq: Once | Status: DC
  Filled 2019-12-07: qty 1

## 2019-12-07 MED ORDER — LACTATED RINGERS IV BOLUS
500.0000 mL | Freq: Once | INTRAVENOUS | Status: AC
  Administered 2019-12-07: 500 mL via INTRAVENOUS

## 2019-12-07 NOTE — Progress Notes (Signed)
Referring Physician(s): Meier,N  Supervising Physician: Oley Balm  Patient Status:  Tasha [name] - In-pt  Chief Complaint:  Recent pulmonary emboli/chest pain/dyspnea  Subjective: Patient doing fairly well today; denies worsening chest pain, cough or dyspnea; did report a small amount of bleeding from nose   Allergies: Patient has no known allergies.  Medications: Prior to Admission medications   Medication Sig Start Date End Date Taking? Authorizing Provider  albuterol (VENTOLIN HFA) 108 (90 Base) MCG/ACT inhaler Inhale 2 puffs into the lungs every 6 (six) hours as needed for wheezing or shortness of breath. 12/24/18  Yes Hawks, Christy A, FNP  Doxylamine-Pyridoxine 10-10 MG TBEC Take 10 mg by mouth at bedtime. 11/14/19  Yes Hall-Potvin, [place], PA-C  Prenatal Vit-Fe Fumarate-FA (MULTIVITAMIN-PRENATAL) 27-0.8 MG TABS tablet Take 1 tablet by mouth daily at 12 noon.   Yes [provider]  cetirizine (ZYRTEC) 10 MG tablet Take 1 tablet (10 mg total) by mouth daily. Patient not taking: Reported on 12/04/2019 12/09/18   Janeece Agee, NP     Vital Signs: BP 100/64   Pulse 65   Temp 97.9 F (36.6 C) (Oral)   Resp 14   Ht 5\' 7"  (1.702 m)   Wt 148 lb 13 oz (67.5 kg)   LMP 09/24/2019   SpO2 100%   BMI 23.31 kg/m   Physical Exam awake, [name].  Access site left common femoral vein soft, clean, dry, mildly tender to palpation, no hematoma.  Imaging: DG Chest 2 View  Result Date: 12/04/2019 CLINICAL DATA:  Chest pain. EXAM: CHEST - 2 VIEW COMPARISON:  10/02/2012 FINDINGS: The heart size and mediastinal contours are within normal limits. Both lungs are clear. No evidence of pneumothorax or pleural effusion. The visualized skeletal structures are unremarkable. IMPRESSION: Normal study. Electronically Signed   By: Danae Orleans M.D.   On: 12/04/2019 13:30   CT Angio Chest PE W and/or Wo Contrast  Result Date: 12/04/2019 CLINICAL DATA:  Patient is pregnant. Chest pain,  pressure, no shortness of breath or cough. EXAM: CT ANGIOGRAPHY CHEST WITH CONTRAST TECHNIQUE: Multidetector CT imaging of the chest was performed using the standard protocol during bolus administration of intravenous contrast. Multiplanar CT image reconstructions and MIPs were obtained to evaluate the vascular anatomy. CONTRAST:  OMNIPAQUE IOHEXOL 350 MG/ML SOLN COMPARISON:  CT chest 12/08/2017 FINDINGS: Cardiovascular: Satisfactory opacification of the pulmonary arteries to the segmental level. There is extensive bilateral nonocclusive and occlusive thrombus involving the right upper, right middle and right lower lobes as well as the left lingula and left lower lobe. Thrombus extends slightly into the left main pulmonary artery. The RV to LV ratio is 1.4 with evidence of right heart strain. Normal caliber of the main pulmonary artery and thoracic aorta. Mediastinum/Nodes: No enlarged mediastinal, hilar, or axillary lymph nodes. Thyroid gland, trachea, and esophagus demonstrate no significant findings. Lungs/Pleura: Lungs are clear. No pleural effusion or pneumothorax. Upper Abdomen: No acute abnormality. Musculoskeletal: No chest wall abnormality. No acute or significant osseous findings. Review of the MIP images confirms the above findings. IMPRESSION: Extensive bilateral nonocclusive and occlusive thrombus, as detailed above. Positive for acute PE with CT evidence of right heart strain (RV/LV Ratio = 1.4) consistent with at least submassive (intermediate risk) PE. The presence of right heart strain has been associated with an increased risk of morbidity and mortality. These results were called by telephone at the time of interpretation on 12/04/2019 at 4:35 pm to provider [name] , who verbally acknowledged these results.  Electronically Signed   By: Emmaline Kluver M.D.   On: 12/04/2019 16:36   US OB Comp Less 14 Wks  Result Date: 12/04/2019 CLINICAL DATA:  Pregnancy, ICU patient EXAM: OBSTETRIC  <14 WK ULTRASOUND TECHNIQUE: Transabdominal ultrasound was performed for evaluation of the gestation as well as the maternal uterus and adnexal regions. COMPARISON:  Obstetrical ultrasound 08/23/2019 FINDINGS: Intrauterine gestational sac: Single Yolk sac:  Visualized. Embryo:  Visualized. Cardiac Activity: Visualized. Heart Rate: 188 bpm CRL:   31.8 mm   10 w 0 d                  [place] EDC: 07/01/2020 Subchorionic hemorrhage: Small volume mixed hypoechoic to anechoic subchorionic hemorrhage is seen. Maternal uterus/adnexae: Normal anteverted uterus. No other focal uterine abnormalities. No concerning adnexal lesions. No free fluid in the pelvis. IMPRESSION: Single intrauterine gestation with a gestational age of [redacted] weeks, 0 days by crown-rump length sonographic estimation. Small volume subchorionic hemorrhage as well as borderline fetal tachycardia. Recommend correlation with clinical findings and obstetrical consultation if not previously obtained. Electronically Signed   By: Kreg Shropshire M.D.   On: 12/04/2019 22:00   IR Angiogram Selective Each Additional Vessel  Result Date: 12/05/2019 INDICATION: 29 year old Tasha [name] with 9 week pregnancy and submassive pulmonary embolism. She presents for thrombo lysis EXAM: ULTRASOUND-GUIDED ACCESS LEFT COMMON FEMORAL VEIN X2 PULMONARY ARTERY ANGIOGRAM PLACEMENT OF RIGHT AND LEFT LYTIC CATHETERS FOR INITIATION OF TPA THROMBOLYSIS COMPARISON:  CT 12/04/2019 MEDICATIONS: None ANESTHESIA/SEDATION: Versed 0.5 mg IV; Fentanyl 25 mcg IV Moderate Sedation Time:  30 minutes The patient was continuously monitored during the procedure by the interventional radiology nurse under my direct supervision. FLUOROSCOPY TIME:  Fluoroscopy Time: 9 minutes 18 seconds (67 mGy). COMPLICATIONS: None TECHNIQUE: Informed consent was obtained from the patient and the patient's family following explanation of the procedure, risks, benefits and alternatives. Specific risks include bleeding, infection,  contrast reaction, kidney injury, venous injury, life-threatening hemorrhage including brain hemorrhage, gastrointestinal hemorrhage, epistaxis, need for further surgery, need for further procedure, cardiopulmonary collapse, death. The patient understands, agrees and consents for the procedure. All questions were addressed. Patient is position supine position on the fluoroscopy table. Maximal barrier sterile technique utilized including caps, mask, sterile gowns, sterile gloves, large sterile drape, hand hygiene, and betadine prep. 1% lidocaine used for local anesthesia. Moderate sedation was provided. Radiologic shielding of the patient's abdomen/pelvis also performed. Ultrasound survey of the left inguinal region was performed with images stored and sent to PACs. A single wall needle was used access the left common femoral vein under ultrasound. With venous blood flow returned, an 035 wire was passed through the needle into the iliac vein. A 7 French sheath was placed over the wire. The dilator was removed and the sheath was flushed. Subsequently, a single wall needle was used access the left common femoral vein adjacent to the initial puncture. With venous blood flow returned, a 7 [place] vascular sheath was placed over the wire. The dilator was removed and the sheath was flushed. Combination of a short Kumpe the catheter and a Bentson wire was then used to navigate through the left iliac vein and advanced to the right atrium. Angled pigtail catheter was then used in attempt to navigate into the pulmonary outflow. We then exchanged the pigtail catheter for a Bern catheter 100 cm. This was used to navigate a Glidewire into the main pulmonary artery. Once the Bern catheter was within the pulmonary artery, pressure measurement was performed. 34/14 (25). Glidewire  was then advanced into the left-sided pulmonary veins, with selection of lower lobe pulmonary vein. Bern catheter was removed and a 15 cm infusion length,  90 cm working length UniFuse catheter was placed. Small contrast infusion confirmed position. Catheter was flushed. Combination of a short Kumpe the and the Bentson wire were then advanced through the alternative sheath. Exchange of the Kumpe the catheter was performed for the angled pigtail catheter. Again angled pigtail catheter was briefly used to achieve position into the pulmonary artery outflow tract. The pigtail catheter was removed and exchanged for a 100 cm Bern catheter. Once the Bern catheter was in place the Glidewire and the burn were used to select the pulmonary artery of flow, placing the wire into the right main pulmonary artery. Once the catheter and wire were positioned into the lower lobar branches, catheter was removed on the Glidewire. A 15 cm infusion length UniFuse 90 cm working length catheter then placed on the glide wire. Wire was removed, small amount of contrast confirmed position and catheter was flushed. The obturator wires were then placed through both the left and right catheters. Sheaths were secured in position.  Final image was stored. The patient tolerated the procedure well and remained hemodynamically stable throughout. No complications were encountered and no significant blood loss was encountered. FINDINGS: Ultrasound survey demonstrates patent left common femoral vein. Main pulmonary artery pressure measures 34/14 (25). The leftward 7 French (red) sheath transmits the left-sided catheter. The right ward 7 French sheath transmits the right-sided pulmonary artery catheter. IMPRESSION: Status post placement of left and right pulmonary artery infusion catheters for initiation of catheter directed thrombolysis. Signed, Yvone Neu. Loreta Ave DO [facility] Radiology PLAN: Patient will be ICU status overnight. Bed rest. Both the right and left catheter will receive 1 mg tPA per hour for 12 hours, for a total dose of 24 mg over 12 hours.  Plan for repeat trans duction of catheter pressures after treatment, reassessment, and probable removal of catheters. EVery 6 our blood draw, including CBC, heparin level, and fibrinogen. Electronically Signed   By: Gilmer Mor D.O.   On: 12/05/2019 15:29   IR Angiogram Selective Each Additional Vessel  Result Date: 12/05/2019 INDICATION: 29 year old Tasha [name] with 9 week pregnancy and submassive pulmonary embolism. She presents for thrombo lysis EXAM: ULTRASOUND-GUIDED ACCESS LEFT COMMON FEMORAL VEIN X2 PULMONARY ARTERY ANGIOGRAM PLACEMENT OF RIGHT AND LEFT LYTIC CATHETERS FOR INITIATION OF TPA THROMBOLYSIS COMPARISON:  CT 12/04/2019 MEDICATIONS: None ANESTHESIA/SEDATION: Versed 0.5 mg IV; Fentanyl 25 mcg IV Moderate Sedation Time:  30 minutes The patient was continuously monitored during the procedure by the interventional radiology nurse under my direct supervision. FLUOROSCOPY TIME:  Fluoroscopy Time: 9 minutes 18 seconds (67 mGy). COMPLICATIONS: None TECHNIQUE: Informed consent was obtained from the patient and the patient's family following explanation of the procedure, risks, benefits and alternatives. Specific risks include bleeding, infection, contrast reaction, kidney injury, venous injury, life-threatening hemorrhage including brain hemorrhage, gastrointestinal hemorrhage, epistaxis, need for further surgery, need for further procedure, cardiopulmonary collapse, death. The patient understands, agrees and consents for the procedure. All questions were addressed. Patient is position supine position on the fluoroscopy table. Maximal barrier sterile technique utilized including caps, mask, sterile gowns, sterile gloves, large sterile drape, hand hygiene, and betadine prep. 1% lidocaine used for local anesthesia. Moderate sedation was provided. Radiologic shielding of the patient's abdomen/pelvis also performed. Ultrasound survey of the left inguinal region was performed with images stored and sent to PACs.  A  single wall needle was used access the left common femoral vein under ultrasound. With venous blood flow returned, an 035 wire was passed through the needle into the iliac vein. A 7 French sheath was placed over the wire. The dilator was removed and the sheath was flushed. Subsequently, a single wall needle was used access the left common femoral vein adjacent to the initial puncture. With venous blood flow returned, a 7 [place] vascular sheath was placed over the wire. The dilator was removed and the sheath was flushed. Combination of a short Kumpe the catheter and a Bentson wire was then used to navigate through the left iliac vein and advanced to the right atrium. Angled pigtail catheter was then used in attempt to navigate into the pulmonary outflow. We then exchanged the pigtail catheter for a Bern catheter 100 cm. This was used to navigate a Glidewire into the main pulmonary artery. Once the Bern catheter was within the pulmonary artery, pressure measurement was performed. 34/14 (25). Glidewire was then advanced into the left-sided pulmonary veins, with selection of lower lobe pulmonary vein. Bern catheter was removed and a 15 cm infusion length, 90 cm working length UniFuse catheter was placed. Small contrast infusion confirmed position. Catheter was flushed. Combination of a short Kumpe the and the Bentson wire were then advanced through the alternative sheath. Exchange of the Kumpe the catheter was performed for the angled pigtail catheter. Again angled pigtail catheter was briefly used to achieve position into the pulmonary artery outflow tract. The pigtail catheter was removed and exchanged for a 100 cm Bern catheter. Once the Bern catheter was in place the Glidewire and the burn were used to select the pulmonary artery of flow, placing the wire into the right main pulmonary artery. Once the catheter and wire were positioned into the lower lobar branches, catheter was removed on the Glidewire. A 15 cm  infusion length UniFuse 90 cm working length catheter then placed on the glide wire. Wire was removed, small amount of contrast confirmed position and catheter was flushed. The obturator wires were then placed through both the left and right catheters. Sheaths were secured in position.  Final image was stored. The patient tolerated the procedure well and remained hemodynamically stable throughout. No complications were encountered and no significant blood loss was encountered. FINDINGS: Ultrasound survey demonstrates patent left common femoral vein. Main pulmonary artery pressure measures 34/14 (25). The leftward 7 French (red) sheath transmits the left-sided catheter. The right ward 7 French sheath transmits the right-sided pulmonary artery catheter. IMPRESSION: Status post placement of left and right pulmonary artery infusion catheters for initiation of catheter directed thrombolysis. Signed, Yvone NeuJaime S. Loreta AveWagner, DO [name] PLAN: Patient will be ICU status overnight. Bed rest. Both the right and left catheter will receive 1 mg tPA per hour for 12 hours, for a total dose of 24 mg over 12 hours. Plan for repeat trans duction of catheter pressures after treatment, reassessment, and probable removal of catheters. EVery 6 our blood draw, including CBC, heparin level, and fibrinogen. Electronically Signed   By: Gilmer MorJaime  Wagner D.O.   On: 12/05/2019 15:29   IR US Guide Vasc Access Left  Result Date: 12/05/2019 INDICATION: 29 year old Tasha [name] with 9 week pregnancy and submassive pulmonary embolism. She presents for thrombo lysis EXAM: ULTRASOUND-GUIDED ACCESS LEFT COMMON FEMORAL VEIN X2 PULMONARY ARTERY ANGIOGRAM PLACEMENT OF RIGHT AND LEFT LYTIC CATHETERS FOR INITIATION OF TPA THROMBOLYSIS COMPARISON:  CT 12/04/2019 MEDICATIONS: None ANESTHESIA/SEDATION: Versed 0.5 mg  IV; Fentanyl 25 mcg IV Moderate Sedation Time:  30 minutes The patient was continuously  monitored during the procedure by the interventional radiology nurse under my direct supervision. FLUOROSCOPY TIME:  Fluoroscopy Time: 9 minutes 18 seconds (67 mGy). COMPLICATIONS: None TECHNIQUE: Informed consent was obtained from the patient and the patient's family following explanation of the procedure, risks, benefits and alternatives. Specific risks include bleeding, infection, contrast reaction, kidney injury, venous injury, life-threatening hemorrhage including brain hemorrhage, gastrointestinal hemorrhage, epistaxis, need for further surgery, need for further procedure, cardiopulmonary collapse, death. The patient understands, agrees and consents for the procedure. All questions were addressed. Patient is position supine position on the fluoroscopy table. Maximal barrier sterile technique utilized including caps, mask, sterile gowns, sterile gloves, large sterile drape, hand hygiene, and betadine prep. 1% lidocaine used for local anesthesia. Moderate sedation was provided. Radiologic shielding of the patient's abdomen/pelvis also performed. Ultrasound survey of the left inguinal region was performed with images stored and sent to PACs. A single wall needle was used access the left common femoral vein under ultrasound. With venous blood flow returned, an 035 wire was passed through the needle into the iliac vein. A 7 French sheath was placed over the wire. The dilator was removed and the sheath was flushed. Subsequently, a single wall needle was used access the left common femoral vein adjacent to the initial puncture. With venous blood flow returned, a 7 [place] vascular sheath was placed over the wire. The dilator was removed and the sheath was flushed. Combination of a short Kumpe the catheter and a Bentson wire was then used to navigate through the left iliac vein and advanced to the right atrium. Angled pigtail catheter was then used in attempt to navigate into the pulmonary outflow. We then exchanged  the pigtail catheter for a Bern catheter 100 cm. This was used to navigate a Glidewire into the main pulmonary artery. Once the Bern catheter was within the pulmonary artery, pressure measurement was performed. 34/14 (25). Glidewire was then advanced into the left-sided pulmonary veins, with selection of lower lobe pulmonary vein. Bern catheter was removed and a 15 cm infusion length, 90 cm working length UniFuse catheter was placed. Small contrast infusion confirmed position. Catheter was flushed. Combination of a short Kumpe the and the Bentson wire were then advanced through the alternative sheath. Exchange of the Kumpe the catheter was performed for the angled pigtail catheter. Again angled pigtail catheter was briefly used to achieve position into the pulmonary artery outflow tract. The pigtail catheter was removed and exchanged for a 100 cm Bern catheter. Once the Bern catheter was in place the Glidewire and the burn were used to select the pulmonary artery of flow, placing the wire into the right main pulmonary artery. Once the catheter and wire were positioned into the lower lobar branches, catheter was removed on the Glidewire. A 15 cm infusion length UniFuse 90 cm working length catheter then placed on the glide wire. Wire was removed, small amount of contrast confirmed position and catheter was flushed. The obturator wires were then placed through both the left and right catheters. Sheaths were secured in position.  Final image was stored. The patient tolerated the procedure well and remained hemodynamically stable throughout. No complications were encountered and no significant blood loss was encountered. FINDINGS: Ultrasound survey demonstrates patent left common femoral vein. Main pulmonary artery pressure measures 34/14 (25). The leftward 7 French (red) sheath transmits the left-sided catheter. The right ward 7 French sheath transmits the right-sided  pulmonary artery catheter. IMPRESSION: Status post  placement of left and right pulmonary artery infusion catheters for initiation of catheter directed thrombolysis. Signed, Yvone Neu. Loreta Ave DO [facility] Radiology PLAN: Patient will be ICU status overnight. Bed rest. Both the right and left catheter will receive 1 mg tPA per hour for 12 hours, for a total dose of 24 mg over 12 hours. Plan for repeat trans duction of catheter pressures after treatment, reassessment, and probable removal of catheters. EVery 6 our blood draw, including CBC, heparin level, and fibrinogen. Electronically Signed   By: Gilmer Mor D.O.   On: 12/05/2019 15:29   IR US Guide Vasc Access Left  Result Date: 12/05/2019 INDICATION: 29 year old Tasha [name] with 9 week pregnancy and submassive pulmonary embolism. She presents for thrombo lysis EXAM: ULTRASOUND-GUIDED ACCESS LEFT COMMON FEMORAL VEIN X2 PULMONARY ARTERY ANGIOGRAM PLACEMENT OF RIGHT AND LEFT LYTIC CATHETERS FOR INITIATION OF TPA THROMBOLYSIS COMPARISON:  CT 12/04/2019 MEDICATIONS: None ANESTHESIA/SEDATION: Versed 0.5 mg IV; Fentanyl 25 mcg IV Moderate Sedation Time:  30 minutes The patient was continuously monitored during the procedure by the interventional radiology nurse under my direct supervision. FLUOROSCOPY TIME:  Fluoroscopy Time: 9 minutes 18 seconds (67 mGy). COMPLICATIONS: None TECHNIQUE: Informed consent was obtained from the patient and the patient's family following explanation of the procedure, risks, benefits and alternatives. Specific risks include bleeding, infection, contrast reaction, kidney injury, venous injury, life-threatening hemorrhage including brain hemorrhage, gastrointestinal hemorrhage, epistaxis, need for further surgery, need for further procedure, cardiopulmonary collapse, death. The patient understands, agrees and consents for the procedure. All questions were addressed. Patient is position supine position on the fluoroscopy table. Maximal barrier  sterile technique utilized including caps, mask, sterile gowns, sterile gloves, large sterile drape, hand hygiene, and betadine prep. 1% lidocaine used for local anesthesia. Moderate sedation was provided. Radiologic shielding of the patient's abdomen/pelvis also performed. Ultrasound survey of the left inguinal region was performed with images stored and sent to PACs. A single wall needle was used access the left common femoral vein under ultrasound. With venous blood flow returned, an 035 wire was passed through the needle into the iliac vein. A 7 French sheath was placed over the wire. The dilator was removed and the sheath was flushed. Subsequently, a single wall needle was used access the left common femoral vein adjacent to the initial puncture. With venous blood flow returned, a 7 [place] vascular sheath was placed over the wire. The dilator was removed and the sheath was flushed. Combination of a short Kumpe the catheter and a Bentson wire was then used to navigate through the left iliac vein and advanced to the right atrium. Angled pigtail catheter was then used in attempt to navigate into the pulmonary outflow. We then exchanged the pigtail catheter for a Bern catheter 100 cm. This was used to navigate a Glidewire into the main pulmonary artery. Once the Bern catheter was within the pulmonary artery, pressure measurement was performed. 34/14 (25). Glidewire was then advanced into the left-sided pulmonary veins, with selection of lower lobe pulmonary vein. Bern catheter was removed and a 15 cm infusion length, 90 cm working length UniFuse catheter was placed. Small contrast infusion confirmed position. Catheter was flushed. Combination of a short Kumpe the and the Bentson wire were then advanced through the alternative sheath. Exchange of the Kumpe the catheter was performed for the angled pigtail catheter. Again angled pigtail catheter was briefly used to achieve position into the pulmonary artery outflow  tract.  The pigtail catheter was removed and exchanged for a 100 cm Bern catheter. Once the Bern catheter was in place the Glidewire and the burn were used to select the pulmonary artery of flow, placing the wire into the right main pulmonary artery. Once the catheter and wire were positioned into the lower lobar branches, catheter was removed on the Glidewire. A 15 cm infusion length UniFuse 90 cm working length catheter then placed on the glide wire. Wire was removed, small amount of contrast confirmed position and catheter was flushed. The obturator wires were then placed through both the left and right catheters. Sheaths were secured in position.  Final image was stored. The patient tolerated the procedure well and remained hemodynamically stable throughout. No complications were encountered and no significant blood loss was encountered. FINDINGS: Ultrasound survey demonstrates patent left common femoral vein. Main pulmonary artery pressure measures 34/14 (25). The leftward 7 French (red) sheath transmits the left-sided catheter. The right ward 7 French sheath transmits the right-sided pulmonary artery catheter. IMPRESSION: Status post placement of left and right pulmonary artery infusion catheters for initiation of catheter directed thrombolysis. Signed, Yvone Neu. Loreta Ave DO [name] Rad[facility] PLAN: Patient will be ICU status overnight. Bed rest. Both the right and left catheter will receive 1 mg tPA per hour for 12 hours, for a total dose of 24 mg over 12 hours. Plan for repeat trans duction of catheter pressures after treatment, reassessment, and probable removal of catheters. EVery 6 our blood draw, including CBC, heparin level, and fibrinogen. Electronically Signed   By: Gilmer Mor D.O.   On: 12/05/2019 15:29   DG CHEST PORT 1 VIEW  Result Date: 12/05/2019 CLINICAL DATA:  Chest pain EXAM: PORTABLE CHEST 1 VIEW COMPARISON:  December 04, 2019 FINDINGS:  The heart size and mediastinal contours are within normal limits. Both lungs are clear. The visualized skeletal structures are unremarkable. IMPRESSION: No active disease. Electronically Signed   By: Jonna Clark M.D.   On: 12/05/2019 20:27   ECHOCARDIOGRAM COMPLETE  Result Date: 12/05/2019    ECHOCARDIOGRAM REPORT   Patient Name:   [name] Date of Exam: 12/05/2019 Medical Rec #:  [id number]        Height:       67.0 in Accession #:    [id number]       Weight:       138.0 lb Date of Birth:  Oct 31, 1990        BSA:          1.727 m Patient Age:    29 years         BP:           96/59 mmHg Patient Gender: F                HR:           84 bpm. Exam Location:  Inpatient Procedure: 2D Echo, Cardiac Doppler and Color Doppler Indications:    Pulmonary Embolus 415.19 / I26.99  History:        Patient has no prior history of Echocardiogram examinations.                 Risk Factors:Former Smoker. GERD. Pregnant.  Sonographer:    Renella Cunas RDCS Referring Phys: [id number] Rodolph Bong  Sonographer Comments: Image acquisition challenging due to patient body habitus. IMPRESSIONS  1. Left ventricular ejection fraction, by estimation, is 60 to 65%. The left ventricle has normal function. Left  ventricular endocardial border not optimally defined to evaluate regional wall motion. Left ventricular diastolic parameters were normal. There is the interventricular septum is flattened in systole, consistent with right ventricular pressure overload.  2. Right ventricular systolic function is severely reduced. The right ventricular size is mildly enlarged. There is normal pulmonary artery systolic pressure.  3. Moderate pericardial effusion. The pericardial effusion is surrounding the apex. There is no evidence of cardiac tamponade.  4. The mitral valve is normal in structure. No evidence of mitral valve regurgitation. No evidence of mitral stenosis.  5. The aortic valve was not well visualized. Aortic valve regurgitation is not  visualized. No aortic stenosis is present.  6. The inferior vena cava is normal in size with <50% respiratory variability, suggesting right atrial pressure of 8 mmHg. FINDINGS  Left Ventricle: Left ventricular ejection fraction, by estimation, is 60 to 65%. The left ventricle has normal function. Left ventricular endocardial border not optimally defined to evaluate regional wall motion. The left ventricular internal cavity size was normal in size. There is no left ventricular hypertrophy. The interventricular septum is flattened in systole, consistent with right ventricular pressure overload. Left ventricular diastolic parameters were normal. Normal left ventricular filling pressure. Right Ventricle: The right ventricular size is mildly enlarged. No increase in right ventricular wall thickness. Right ventricular systolic function is severely reduced. There is normal pulmonary artery systolic pressure. The tricuspid regurgitant velocity is 2.34 m/s, and with an assumed right atrial pressure of 8 mmHg, the estimated right ventricular systolic pressure is 29.9 mmHg. Left Atrium: Left atrial size was normal in size. Right Atrium: Right atrial size was normal in size. Pericardium: A moderately sized pericardial effusion is present. The pericardial effusion is surrounding the apex. There is no evidence of cardiac tamponade. Mitral Valve: The mitral valve is normal in structure. No evidence of mitral valve regurgitation. No evidence of mitral valve stenosis. Tricuspid Valve: The tricuspid valve is normal in structure. Tricuspid valve regurgitation is trivial. No evidence of tricuspid stenosis. Aortic Valve: The aortic valve was not well visualized. Aortic valve regurgitation is not visualized. No aortic stenosis is present. Pulmonic Valve: The pulmonic valve was normal in structure. Pulmonic valve regurgitation is not visualized. No evidence of pulmonic stenosis. Aorta: The aortic root is normal in size and structure.  Venous: The inferior vena cava is normal in size with less than 50% respiratory variability, suggesting right atrial pressure of 8 mmHg. IAS/Shunts: No atrial level shunt detected by color flow Doppler.  LEFT VENTRICLE PLAX 2D LVIDd:         3.20 cm  Diastology LVIDs:         2.50 cm  LV e' medial:    7.51 cm/s LV PW:         0.70 cm  LV E/e' medial:  6.3 LV IVS:        0.70 cm  LV e' lateral:   9.79 cm/s LVOT diam:     1.70 cm  LV E/e' lateral: 4.8 LV SV:         28 LV SV Index:   16 LVOT Area:     2.27 cm  RIGHT VENTRICLE RV S prime:     8.55 cm/s TAPSE (M-mode): 1.5 cm LEFT ATRIUM             Index      RIGHT ATRIUM           Index LA diam:        1.20 cm  0.69 cm/m RA Area:     10.90 cm LA Vol (A2C):   9.2 ml  5.34 ml/m RA Volume:   24.30 ml  14.07 ml/m LA Vol (A4C):   7.8 ml  4.51 ml/m LA Biplane Vol: 9.0 ml  5.20 ml/m  AORTIC VALVE LVOT Vmax:   73.20 cm/s LVOT Vmean:  51.900 cm/s LVOT VTI:    0.122 m  AORTA Ao Root diam: 2.30 cm MITRAL VALVE               TRICUSPID VALVE MV Area (PHT): 3.99 cm    TR Peak grad:   21.9 mmHg MV Decel Time: 190 msec    TR Vmax:        234.00 cm/s MV E velocity: 47.40 cm/s MV A velocity: 35.50 cm/s  SHUNTS MV E/A ratio:  1.34        Systemic VTI:  0.12 m                            Systemic Diam: 1.70 cm Armanda Magic MD Electronically signed by Armanda Magic MD Signature Date/Time: 12/05/2019/10:31:55 AM    Final    VAS [place] LOWER EXTREMITY VENOUS (DVT)  Result Date: 12/06/2019  Lower Venous DVTStudy Indications: Pulmonary embolism.  Limitations: Line and bandages. Comparison Study: No prior study Performing Technologist: Gertie Fey MHA, RDMS, RVT, RDCS  Examination Guidelines: A complete evaluation includes B-mode imaging, spectral Doppler, color Doppler, and power Doppler as needed of all accessible portions of each vessel. Bilateral testing is considered an integral part of a complete examination. Limited examinations for reoccurring indications may be performed as  noted. The reflux portion of the exam is performed with the patient in reverse Trendelenburg.  +---------+---------------+---------+-----------+----------+--------------+ RIGHT    CompressibilityPhasicitySpontaneityPropertiesThrombus Aging +---------+---------------+---------+-----------+----------+--------------+ FV Prox  Full                                                        +---------+---------------+---------+-----------+----------+--------------+ FV Mid   Full                                                        +---------+---------------+---------+-----------+----------+--------------+ FV DistalFull                                                        +---------+---------------+---------+-----------+----------+--------------+ PFV      Full                                                        +---------+---------------+---------+-----------+----------+--------------+ POP      Full           Yes      Yes                                 +---------+---------------+---------+-----------+----------+--------------+  PTV      Full                                                        +---------+---------------+---------+-----------+----------+--------------+ PERO     Full                                                        +---------+---------------+---------+-----------+----------+--------------+   Right Technical Findings: Not visualized segments include CFV, SFJ due to femoral line and bandaging. Limited evaluation right PTV, peroneal veins.  +---------+---------------+---------+-----------+----------+--------------+ LEFT     CompressibilityPhasicitySpontaneityPropertiesThrombus Aging +---------+---------------+---------+-----------+----------+--------------+ CFV      Full           Yes      Yes                                 +---------+---------------+---------+-----------+----------+--------------+ SFJ      Full                                                         +---------+---------------+---------+-----------+----------+--------------+ FV Prox  Full                                                        +---------+---------------+---------+-----------+----------+--------------+ FV Mid   Full                                                        +---------+---------------+---------+-----------+----------+--------------+ FV DistalFull                                                        +---------+---------------+---------+-----------+----------+--------------+ PFV      Full                                                        +---------+---------------+---------+-----------+----------+--------------+ POP      Full           Yes      Yes                                 +---------+---------------+---------+-----------+----------+--------------+ PTV      Full                                                        +---------+---------------+---------+-----------+----------+--------------+  PERO     Full                                                        +---------+---------------+---------+-----------+----------+--------------+     Summary: RIGHT: - There is no evidence of deep vein thrombosis in the lower extremity. However, portions of this examination were limited- see technologist comments above.  - No cystic structure found in the popliteal fossa.  LEFT: - There is no evidence of deep vein thrombosis in the lower extremity.  - No cystic structure found in the popliteal fossa.  *See table(s) above for measurements and observations. Electronically signed by Coral Else MD on 12/06/2019 at 7:43:58 PM.    Final    IR INFUSION THROMBOL ARTERIAL INITIAL (MS)  Result Date: 12/05/2019 INDICATION: 29 year old Tasha [name] with 9 week pregnancy and submassive pulmonary embolism. She presents for thrombo lysis EXAM: ULTRASOUND-GUIDED ACCESS LEFT COMMON FEMORAL VEIN X2 PULMONARY ARTERY  ANGIOGRAM PLACEMENT OF RIGHT AND LEFT LYTIC CATHETERS FOR INITIATION OF TPA THROMBOLYSIS COMPARISON:  CT 12/04/2019 MEDICATIONS: None ANESTHESIA/SEDATION: Versed 0.5 mg IV; Fentanyl 25 mcg IV Moderate Sedation Time:  30 minutes The patient was continuously monitored during the procedure by the interventional radiology nurse under my direct supervision. FLUOROSCOPY TIME:  Fluoroscopy Time: 9 minutes 18 seconds (67 mGy). COMPLICATIONS: None TECHNIQUE: Informed consent was obtained from the patient and the patient's family following explanation of the procedure, risks, benefits and alternatives. Specific risks include bleeding, infection, contrast reaction, kidney injury, venous injury, life-threatening hemorrhage including brain hemorrhage, gastrointestinal hemorrhage, epistaxis, need for further surgery, need for further procedure, cardiopulmonary collapse, death. The patient understands, agrees and consents for the procedure. All questions were addressed. Patient is position supine position on the fluoroscopy table. Maximal barrier sterile technique utilized including caps, mask, sterile gowns, sterile gloves, large sterile drape, hand hygiene, and betadine prep. 1% lidocaine used for local anesthesia. Moderate sedation was provided. Radiologic shielding of the patient's abdomen/pelvis also performed. Ultrasound survey of the left inguinal region was performed with images stored and sent to PACs. A single wall needle was used access the left common femoral vein under ultrasound. With venous blood flow returned, an 035 wire was passed through the needle into the iliac vein. A 7 French sheath was placed over the wire. The dilator was removed and the sheath was flushed. Subsequently, a single wall needle was used access the left common femoral vein adjacent to the initial puncture. With venous blood flow returned, a 7 [place] vascular sheath was placed over the wire. The dilator was removed and the sheath was flushed.  Combination of a short Kumpe the catheter and a Bentson wire was then used to navigate through the left iliac vein and advanced to the right atrium. Angled pigtail catheter was then used in attempt to navigate into the pulmonary outflow. We then exchanged the pigtail catheter for a Bern catheter 100 cm. This was used to navigate a Glidewire into the main pulmonary artery. Once the Bern catheter was within the pulmonary artery, pressure measurement was performed. 34/14 (25). Glidewire was then advanced into the left-sided pulmonary veins, with selection of lower lobe pulmonary vein. Bern catheter was removed and a 15 cm infusion length, 90 cm working length UniFuse catheter was placed. Small contrast infusion confirmed position. Catheter was flushed. Combination of a short Kumpe  the and the Bentson wire were then advanced through the alternative sheath. Exchange of the Kumpe the catheter was performed for the angled pigtail catheter. Again angled pigtail catheter was briefly used to achieve position into the pulmonary artery outflow tract. The pigtail catheter was removed and exchanged for a 100 cm Bern catheter. Once the Bern catheter was in place the Glidewire and the burn were used to select the pulmonary artery of flow, placing the wire into the right main pulmonary artery. Once the catheter and wire were positioned into the lower lobar branches, catheter was removed on the Glidewire. A 15 cm infusion length UniFuse 90 cm working length catheter then placed on the glide wire. Wire was removed, small amount of contrast confirmed position and catheter was flushed. The obturator wires were then placed through both the left and right catheters. Sheaths were secured in position.  Final image was stored. The patient tolerated the procedure well and remained hemodynamically stable throughout. No complications were encountered and no significant blood loss was encountered. FINDINGS: Ultrasound survey demonstrates patent  left common femoral vein. Main pulmonary artery pressure measures 34/14 (25). The leftward 7 French (red) sheath transmits the left-sided catheter. The right ward 7 French sheath transmits the right-sided pulmonary artery catheter. IMPRESSION: Status post placement of left and right pulmonary artery infusion catheters for initiation of catheter directed thrombolysis. Signed, Yvone Neu. Loreta Ave DO [name] Radiology PLAN: Patient will be ICU status overnight. Bed rest. Both the right and left catheter will receive 1 mg tPA per hour for 12 hours, for a total dose of 24 mg over 12 hours. Plan for repeat trans duction of catheter pressures after treatment, reassessment, and probable removal of catheters. EVery 6 our blood draw, including CBC, heparin level, and fibrinogen. Electronically Signed   By: Gilmer Mor D.O.   On: 12/05/2019 15:29   IR THROMB F/U EVAL ART/VEN FINAL DAY (MS)  Result Date: 12/06/2019 INDICATION: 29 year old Tasha [name] with sub massive pulmonary embolism, completion lysis EXAM: FOLLOW-UP PULMONARY ARTERIAL LYSIS COMPARISON:  Yesterday exam including initiation CT chest 12/04/2019 MEDICATIONS: None. ANESTHESIA/SEDATION: None FLUOROSCOPY TIME:  None COMPLICATIONS: None TECHNIQUE: Informed written consent was inferred from the patient after a thorough discussion of the procedural risks, benefits and alternatives. All questions were addressed. Maximal Sterile Barrier Technique was utilized including caps, mask, sterile gowns, sterile gloves, sterile drape, hand hygiene and skin antiseptic. A timeout was performed prior to the initiation of the procedure. Bedside trans duction of the catheter was performed by the interventional radiology team, with pullback of the pulmonary artery catheter and removal of the operator wire. With flow confirmed, pressure was recorded. After pressure was recorded all catheters wires and sheaths were removed. Manual  pressure was used for hemostasis and dressing was applied. FINDINGS: 23/6 (13) Initial: 34/14 (25) IMPRESSION: Bedside trans duction of pulmonary artery catheters, and removal of catheters and sheaths. Signed, Yvone Neu. Reyne Dumas, RPVI [name] Electronically Signed   By: Gilmer Mor D.O.   On: 12/06/2019 10:52    Labs:  CBC: Recent Labs    12/05/19 1920 12/05/19 1936 12/06/19 0009 12/06/19 0115 12/06/19 1320 12/07/19 0412  WBC 6.8  --   --  7.0 6.7 5.4  HGB 9.8*   < > 8.4* 8.6* 8.6* 7.9*  HCT 28.0*   < > 24.3* 25.3* 24.7* 22.2*  PLT 162  --   --  145* 147* 146*   < > =  values in this interval not displayed.    COAGS: No results for input(s): INR, APTT in the last 8760 hours.  BMP: Recent Labs    12/05/19 0042 12/05/19 0042 12/05/19 1920 12/05/19 1936 12/06/19 0459 12/07/19 0800  NA 135   < > 136 136 135 135  K 3.8   < > 3.5 3.4* 3.7 3.7  CL 105  --  109  --  111 109  CO2 21*  --  21*  --  19* 20*  GLUCOSE 91  --  96  --  83 81  BUN 9  --  7  --  6 <5*  CALCIUM 8.5*  --  8.4*  --  7.6* 7.7*  CREATININE 0.80  --  0.76  --  0.69 0.63  GFRNONAA >60  --  >60  --  >60 >60  GFRAA >60  --  >60  --  >60 >60   < > = values in this interval not displayed.    LIVER FUNCTION TESTS: Recent Labs    12/05/19 0042  BILITOT 0.7  AST 21  ALT 12  ALKPHOS 38  PROT 6.4*  ALBUMIN 3.5    Assessment and Plan: Patient with history of 9-week pregnancy, submassive bilateral PE, sickle cell trait; status post PE lysis completion yesterday; breathing better since lysis performed, left common femoral vein access site okay; afebrile, BP 100/64, WBC normal, hemoglobin 7.9, creat nl; further plans as per CCM   Electronically Signed: D. Jeananne Rama, PA-C 12/07/2019, 11:17 AM   I spent a total of 15 minutes at the the patient's bedside AND on the patient's hospital floor or unit, greater than 50% of which was  counseling/coordinating care for thrombolytic therapy of bilateral pulmonary emboli    Patient ID: Tasha [name], Tasha [name]   DOB: [birth year], 30 y.o.   MRN: [id number]

## 2019-12-07 NOTE — Progress Notes (Addendum)
PROGRESS NOTE    Tasha [name]  CHY:[id number] DOB: 02/25/1991 DOA: 12/04/2019 PCP: Janeece Agee, NP    Chief Complaint  Patient presents with  . Chest Pain  . Shortness of Breath  . Loss of Consciousness    Brief Narrative:  Patient is a pleasant 29 year old female presenting with likely pregnancy related submassive bilateral PE, placed in the ICU placed on IV heparin, overnight noted to have episodes of hypotension, patient also noted with elevated cardiac enzymes, concern for cardiovascular instability secondary to submassive bilateral PE.  PCCM consulted.  IR consulted.  Patient being transferred to [facility] for evaluation for systemic IV lytic therapy versus catheter directed therapies.   Assessment & Plan:   Principal Problem:   Bilateral pulmonary embolism (HCC) Active Problems:   High-risk pregnancy in first trimester   Anemia   Anxiety   Gastroesophageal reflux disease   Pregnant   Hypokalemia   1  Bilateral submassive PE Likely pregnancy related.  Patient with no family history of prior history of DVT or PE.  Patient with no recent surgeries or long travel.  Patient noted overnight to be hypotensive, with some dizziness, symptomatic.  Cardiac enzymes were trending up on admission.  2D echo with right ventricular strain.  Lower extremity Dopplers negative.  Patient underwent thrombolytics per IR due to hemodynamic instability and right ventricular strain with clinical improvement.  Patient noted to have a hypotensive episode this morning which responded to a fluid bolus.  Patient with headaches overnight which responded to Tylenol.  Sheaths were removed.  IR following.  Continue IV heparin.  PCCM following.  PCCM recommending transitioning to Lovenox in the next 24 hours for management of PE through pregnancy up until 6 weeks postpartum.  Mobilize.  PT evaluation.  2.  High risk pregnancy On presentation patient noted to be [redacted] weeks pregnant.  Patient now  with bilateral pulmonary emboli.  Patient with history of sickle cell trait.  Patient high risk pregnancy.  2D echo pending.  Lower extremity Dopplers pending.  Case was discussed on admission with OB/GYN, Dr.Pinn who recommended viability ultrasound.  Ultrasound of the pelvis done with single intrauterine gestation with a gestational age of [redacted] weeks 0 days by crown-rump length sonographic estimation, small-volume subchorionic hemorrhage as well as borderline fetal tachycardia.  OB/GYN consulted and patient seen by Dr. Mora Appl on 12/05/2019 who had discussed importance of moving forward with catheter directed thrombolysis versus TPA with patient which patient subsequently underwent and recommended outpatient follow-up.   3.  Gastroesophageal reflux disease Pepcid.  4.  Hypokalemia Repleted.  Potassium at 3.7.  Magnesium at 1.9.  Magnesium 2 g IV x1.  5.  Anemia Patient with no overt bleeding.  Likely dilutional.  Patient however underwent thrombolysis and on IV heparin.  Repeat H&H this afternoon.  Transfusion threshold hemoglobin < 7.   DVT prophylaxis: Heparin Code Status: Full Family Communication: Updated patient and fianc at bedside. Disposition:   Status is: Inpatient    Dispo: The patient is from: Home              Anticipated d/c is to: Home              Anticipated d/c date is: To be determined              Patient currently status post thrombolysis for bilateral PE with right ventricular strain.  Patient with borderline blood pressure this morning.  Not stable for discharge.  Consultants:   PCCM: Dr.Olalere 12/04/2019  OB/GYN: Dr.Pinn--12/05/2019  Interventional radiology: Dr. Loreta Ave 12/05/2019  Procedures:   CT angiogram chest 12/04/2019  Chest x-ray 12/04/2019  Viability pelvic ultrasound 12/04/2019  2D echo 12/05/2019  Lower extremity Dopplers 12/04/2019  Ultrasound-guided access left common femoral vein x2/pulmonary artery angiogram/placement of right and  left lytic catheters for initiation of TPA thrombolysis per IR, Dr. Loreta Ave 12/05/2019  Bedside transduction of pulmonary artery catheters and removal of catheters and sheaths per IR, Dr. Loreta Ave 12/06/2019  Arterial line placement 12/05/2019  Central venous access 12/05/2019  Antimicrobials:   None   Subjective: Patient sitting up in bed.  Noted to be hypotensive early on this morning requiring a fluid bolus.  Denies any chest pain.  No shortness of breath.  States headache improved with Tylenol.  Stated had a little bit of bleeding in her nares which has since resolved.  Denies any overt bleeding.  Stated has not been out of bed to determine as to whether she gets dizzy or lightheaded with ambulation.  Fianc at bedside.  Objective: Vitals:   12/07/19 0430 12/07/19 0500 12/07/19 0530 12/07/19 0646  BP: 106/63 (!) 106/58 100/64   Pulse: 75 71 65   Resp: 17 16 14    Temp:    98.6 F (37 C)  TempSrc:    Oral  SpO2: 100% 100% 100%   Weight:      Height:        Intake/Output Summary (Last 24 hours) at 12/07/2019 0909 Last data filed at 12/07/2019 0700 Gross per 24 hour  Intake 3243.8 ml  Output 4100 ml  Net -856.2 ml   Filed Weights   12/05/19 0500 12/06/19 0459 12/07/19 0416  Weight: 62.6 kg 64.8 kg 67.5 kg    Examination:  General exam: NAD.  Respiratory system: Clear to auscultation bilaterally.  No wheezes, no crackles, no rhonchi.  Normal respiratory effort.  Cardiovascular system: Regular rate and rhythm no murmurs rubs or gallops.  No JVD.  No lower extremity edema.   Gastrointestinal system: Abdomen is soft, nontender, nondistended, positive bowel sounds.  No rebound.  No guarding.  Central nervous system: Alert and oriented. No focal neurological deficits. Extremities: Symmetric 5 x 5 power. Skin: No rashes, lesions or ulcers Psychiatry: Judgement and insight appear normal. Mood & affect appropriate.     Data Reviewed: I have personally reviewed following labs and  imaging studies  CBC: Recent Labs  Lab 12/05/19 1128 12/05/19 1128 12/05/19 1920 12/05/19 1936 12/05/19 2224 12/06/19 0009 12/06/19 0115 12/06/19 1320 12/07/19 0412  WBC 5.8  --  6.8  --   --   --  7.0 6.7 5.4  HGB 9.8*   < > 9.8*   < > 9.3* 8.4* 8.6* 8.6* 7.9*  HCT 28.3*   < > 28.0*   < > 27.7* 24.3* 25.3* 24.7* 22.2*  MCV 79.3*  --  78.9*  --   --   --  80.1 78.9* 79.0*  PLT 179  --  162  --   --   --  145* 147* 146*   < > = values in this interval not displayed.    Basic Metabolic Panel: Recent Labs  Lab 12/04/19 1306 12/04/19 1306 12/05/19 0042 12/05/19 1920 12/05/19 1936 12/05/19 1949 12/06/19 0459 12/07/19 0412 12/07/19 0800  NA 134*   < > 135 136 136  --  135  --  135  K 3.3*   < > 3.8 3.5 3.4*  --  3.7  --  3.7  CL 103  --  105 109  --   --  111  --  109  CO2 22  --  21* 21*  --   --  19*  --  20*  GLUCOSE 112*  --  91 96  --   --  83  --  81  BUN 11  --  9 7  --   --  6  --  <5*  CREATININE 0.86  --  0.80 0.76  --   --  0.69  --  0.63  CALCIUM 8.9  --  8.5* 8.4*  --   --  7.6*  --  7.7*  MG  --   --   --   --   --  2.2 1.7 1.9  --   PHOS  --   --   --   --   --   --  2.9  --   --    < > = values in this interval not displayed.    GFR: Estimated Creatinine Clearance: 100 mL/min (by C-G formula based on SCr of 0.63 mg/dL).  Liver Function Tests: Recent Labs  Lab 12/05/19 0042  AST 21  ALT 12  ALKPHOS 38  BILITOT 0.7  PROT 6.4*  ALBUMIN 3.5    CBG: Recent Labs  Lab 12/07/19 0820  GLUCAP 79     Recent Results (from the past 240 hour(s))  Respiratory Panel by RT PCR (Flu A&B, Covid) - Nasopharyngeal Swab     Status: None   Collection Time: 12/04/19  5:58 PM   Specimen: Nasopharyngeal Swab  Result Value Ref Range Status   SARS Coronavirus 2 by RT PCR NEGATIVE NEGATIVE Final    Comment: (NOTE) SARS-CoV-2 target nucleic acids are NOT DETECTED.  The SARS-CoV-2 RNA is generally detectable in upper respiratoy specimens during the acute  phase of infection. The lowest concentration of SARS-CoV-2 viral copies this assay can detect is 131 copies/mL. A negative result does not preclude SARS-Cov-2 infection and should not be used as the sole basis for treatment or other patient management decisions. A negative result may occur with  improper specimen collection/handling, submission of specimen other than nasopharyngeal swab, presence of viral mutation(s) within the areas targeted by this assay, and inadequate number of viral copies (<131 copies/mL). A negative result must be combined with clinical observations, patient history, and epidemiological information. The expected result is Negative.  Fact Sheet for Patients:  https://www.moore.com/https://www.fda.gov/media/142436/download  Fact Sheet for Healthcare Providers:  https://www.young.biz/https://www.fda.gov/media/142435/download  This test is no t yet approved or cleared by the [place] FDA and  has been authorized for detection and/or diagnosis of SARS-CoV-2 by FDA under an Emergency Use Authorization (EUA). This EUA will remain  in effect (meaning this test can be used) for the duration of the COVID-19 declaration under Section 564(b)(1) of the Act, 21 U.S.C. section 360bbb-3(b)(1), unless the authorization is terminated or revoked sooner.     Influenza A by PCR NEGATIVE NEGATIVE Final   Influenza B by PCR NEGATIVE NEGATIVE Final    Comment: (NOTE) The Xpert Xpress SARS-CoV-2/FLU/RSV assay is intended as an aid in  the diagnosis of influenza from Nasopharyngeal swab specimens and  should not be used as a sole basis for treatment. Nasal washings and  aspirates are unacceptable for Xpert Xpress SARS-CoV-2/FLU/RSV  testing.  Fact Sheet for Patients: https://www.moore.com/https://www.fda.gov/media/142436/download  Fact Sheet for Healthcare Providers: https://www.young.biz/https://www.fda.gov/media/142435/download  This test is not yet approved or cleared by the [place]  FDA and  has been authorized for detection and/or diagnosis of  SARS-CoV-2 by  FDA under an Emergency Use Authorization (EUA). This EUA will remain  in effect (meaning this test can be used) for the duration of the  Covid-19 declaration under Section 564(b)(1) of the Act, 21  U.S.C. section 360bbb-3(b)(1), unless the authorization is  terminated or revoked. Performed at [facility], [address] [place], [place] [address]   MRSA PCR Screening     Status: None   Collection Time: 12/04/19  7:12 PM   Specimen: Nasal Mucosa; Nasopharyngeal  Result Value Ref Range Status   MRSA by PCR NEGATIVE NEGATIVE Final    Comment:        The GeneXpert MRSA Assay (FDA approved for NASAL specimens only), is one component of a comprehensive MRSA colonization surveillance program. It is not intended to diagnose MRSA infection nor to guide or monitor treatment for MRSA infections. Performed at [facility], [address] [place], [place] [address]   Urine culture     Status: Abnormal   Collection Time: 12/05/19  2:55 AM   Specimen: Urine, Clean Catch  Result Value Ref Range Status   Specimen Description   Final    URINE, CLEAN CATCH Performed at [facility], [address] [place], [place] [address]    Special Requests   Final    NONE Performed at [facility], [address] [place], [place] [address]    Culture (A)  Final    >=100,000 COLONIES/mL MULTIPLE SPECIES PRESENT, SUGGEST RECOLLECTION   Report Status 12/06/2019 FINAL  Final         Radiology Studies: IR Angiogram Selective Each Additional Vessel  Result Date: 12/05/2019 INDICATION: 29 year old female with 9 week pregnancy and submassive pulmonary embolism. She presents for thrombo lysis EXAM: ULTRASOUND-GUIDED ACCESS LEFT COMMON FEMORAL VEIN X2 PULMONARY ARTERY ANGIOGRAM PLACEMENT OF RIGHT AND LEFT LYTIC CATHETERS FOR INITIATION OF TPA THROMBOLYSIS COMPARISON:  CT 12/04/2019 MEDICATIONS: None  ANESTHESIA/SEDATION: Versed 0.5 mg IV; Fentanyl 25 mcg IV Moderate Sedation Time:  30 minutes The patient was continuously monitored during the procedure by the interventional radiology nurse under my direct supervision. FLUOROSCOPY TIME:  Fluoroscopy Time: 9 minutes 18 seconds (67 mGy). COMPLICATIONS: None TECHNIQUE: Informed consent was obtained from the patient and the patient's family following explanation of the procedure, risks, benefits and alternatives. Specific risks include bleeding, infection, contrast reaction, kidney injury, venous injury, life-threatening hemorrhage including brain hemorrhage, gastrointestinal hemorrhage, epistaxis, need for further surgery, need for further procedure, cardiopulmonary collapse, death. The patient understands, agrees and consents for the procedure. All questions were addressed. Patient is position supine position on the fluoroscopy table. Maximal barrier sterile technique utilized including caps, mask, sterile gowns, sterile gloves, large sterile drape, hand hygiene, and betadine prep. 1% lidocaine used for local anesthesia. Moderate sedation was provided. Radiologic shielding of the patient's abdomen/pelvis also performed. Ultrasound survey of the left inguinal region was performed with images stored and sent to PACs. A single wall needle was used access the left common femoral vein under ultrasound. With venous blood flow returned, an 035 wire was passed through the needle into the iliac vein. A 7 French sheath was placed over the wire. The dilator was removed and the sheath was flushed. Subsequently, a single wall needle was used access the left common femoral vein adjacent to the initial puncture. With venous blood flow returned, a 7 [place] vascular sheath was placed over the  wire. The dilator was removed and the sheath was flushed. Combination of a short Kumpe the catheter and a Bentson wire was then used to navigate through the left iliac vein and advanced to the  right atrium. Angled pigtail catheter was then used in attempt to navigate into the pulmonary outflow. We then exchanged the pigtail catheter for a Bern catheter 100 cm. This was used to navigate a Glidewire into the main pulmonary artery. Once the Bern catheter was within the pulmonary artery, pressure measurement was performed. 34/14 (25). Glidewire was then advanced into the left-sided pulmonary veins, with selection of lower lobe pulmonary vein. Bern catheter was removed and a 15 cm infusion length, 90 cm working length UniFuse catheter was placed. Small contrast infusion confirmed position. Catheter was flushed. Combination of a short Kumpe the and the Bentson wire were then advanced through the alternative sheath. Exchange of the Kumpe the catheter was performed for the angled pigtail catheter. Again angled pigtail catheter was briefly used to achieve position into the pulmonary artery outflow tract. The pigtail catheter was removed and exchanged for a 100 cm Bern catheter. Once the Bern catheter was in place the Glidewire and the burn were used to select the pulmonary artery of flow, placing the wire into the right main pulmonary artery. Once the catheter and wire were positioned into the lower lobar branches, catheter was removed on the Glidewire. A 15 cm infusion length UniFuse 90 cm working length catheter then placed on the glide wire. Wire was removed, small amount of contrast confirmed position and catheter was flushed. The obturator wires were then placed through both the left and right catheters. Sheaths were secured in position.  Final image was stored. The patient tolerated the procedure well and remained hemodynamically stable throughout. No complications were encountered and no significant blood loss was encountered. FINDINGS: Ultrasound survey demonstrates patent left common femoral vein. Main pulmonary artery pressure measures 34/14 (25). The leftward 7 French (red) sheath transmits the  left-sided catheter. The right ward 7 French sheath transmits the right-sided pulmonary artery catheter. IMPRESSION: Status post placement of left and right pulmonary artery infusion catheters for initiation of catheter directed thrombolysis. Signed, Yvone Neu. Loreta Ave DO [facility] Radiology PLAN: Patient will be ICU status overnight. Bed rest. Both the right and left catheter will receive 1 mg tPA per hour for 12 hours, for a total dose of 24 mg over 12 hours. Plan for repeat trans duction of catheter pressures after treatment, reassessment, and probable removal of catheters. EVery 6 our blood draw, including CBC, heparin level, and fibrinogen. Electronically Signed   By: Gilmer Mor D.O.   On: 12/05/2019 15:29   IR Angiogram Selective Each Additional Vessel  Result Date: 12/05/2019 INDICATION: 29 year old female with 9 week pregnancy and submassive pulmonary embolism. She presents for thrombo lysis EXAM: ULTRASOUND-GUIDED ACCESS LEFT COMMON FEMORAL VEIN X2 PULMONARY ARTERY ANGIOGRAM PLACEMENT OF RIGHT AND LEFT LYTIC CATHETERS FOR INITIATION OF TPA THROMBOLYSIS COMPARISON:  CT 12/04/2019 MEDICATIONS: None ANESTHESIA/SEDATION: Versed 0.5 mg IV; Fentanyl 25 mcg IV Moderate Sedation Time:  30 minutes The patient was continuously monitored during the procedure by the interventional radiology nurse under my direct supervision. FLUOROSCOPY TIME:  Fluoroscopy Time: 9 minutes 18 seconds (67 mGy). COMPLICATIONS: None TECHNIQUE: Informed consent was obtained from the patient and the patient's family following explanation of the procedure, risks, benefits and alternatives. Specific risks include bleeding, infection, contrast reaction, kidney injury, venous injury, life-threatening hemorrhage including brain hemorrhage, gastrointestinal  hemorrhage, epistaxis, need for further surgery, need for further procedure, cardiopulmonary collapse, death. The patient understands,  agrees and consents for the procedure. All questions were addressed. Patient is position supine position on the fluoroscopy table. Maximal barrier sterile technique utilized including caps, mask, sterile gowns, sterile gloves, large sterile drape, hand hygiene, and betadine prep. 1% lidocaine used for local anesthesia. Moderate sedation was provided. Radiologic shielding of the patient's abdomen/pelvis also performed. Ultrasound survey of the left inguinal region was performed with images stored and sent to PACs. A single wall needle was used access the left common femoral vein under ultrasound. With venous blood flow returned, an 035 wire was passed through the needle into the iliac vein. A 7 French sheath was placed over the wire. The dilator was removed and the sheath was flushed. Subsequently, a single wall needle was used access the left common femoral vein adjacent to the initial puncture. With venous blood flow returned, a 7 [place] vascular sheath was placed over the wire. The dilator was removed and the sheath was flushed. Combination of a short Kumpe the catheter and a Bentson wire was then used to navigate through the left iliac vein and advanced to the right atrium. Angled pigtail catheter was then used in attempt to navigate into the pulmonary outflow. We then exchanged the pigtail catheter for a Bern catheter 100 cm. This was used to navigate a Glidewire into the main pulmonary artery. Once the Bern catheter was within the pulmonary artery, pressure measurement was performed. 34/14 (25). Glidewire was then advanced into the left-sided pulmonary veins, with selection of lower lobe pulmonary vein. Bern catheter was removed and a 15 cm infusion length, 90 cm working length UniFuse catheter was placed. Small contrast infusion confirmed position. Catheter was flushed. Combination of a short Kumpe the and the Bentson wire were then advanced through the alternative sheath. Exchange of the Kumpe the catheter  was performed for the angled pigtail catheter. Again angled pigtail catheter was briefly used to achieve position into the pulmonary artery outflow tract. The pigtail catheter was removed and exchanged for a 100 cm Bern catheter. Once the Bern catheter was in place the Glidewire and the burn were used to select the pulmonary artery of flow, placing the wire into the right main pulmonary artery. Once the catheter and wire were positioned into the lower lobar branches, catheter was removed on the Glidewire. A 15 cm infusion length UniFuse 90 cm working length catheter then placed on the glide wire. Wire was removed, small amount of contrast confirmed position and catheter was flushed. The obturator wires were then placed through both the left and right catheters. Sheaths were secured in position.  Final image was stored. The patient tolerated the procedure well and remained hemodynamically stable throughout. No complications were encountered and no significant blood loss was encountered. FINDINGS: Ultrasound survey demonstrates patent left common femoral vein. Main pulmonary artery pressure measures 34/14 (25). The leftward 7 French (red) sheath transmits the left-sided catheter. The right ward 7 French sheath transmits the right-sided pulmonary artery catheter. IMPRESSION: Status post placement of left and right pulmonary artery infusion catheters for initiation of catheter directed thrombolysis. Signed, Yvone Neu. Loreta Ave DO [facility] Radiology PLAN: Patient will be ICU status overnight. Bed rest. Both the right and left catheter will receive 1 mg tPA per hour for 12 hours, for a total dose of 24 mg over 12 hours. Plan for repeat trans duction of catheter pressures after treatment, reassessment,  and probable removal of catheters. EVery 6 our blood draw, including CBC, heparin level, and fibrinogen. Electronically Signed   By: Gilmer Mor D.O.   On: 12/05/2019 15:29     IR US Guide Vasc Access Left  Result Date: 12/05/2019 INDICATION: 29 year old female with 9 week pregnancy and submassive pulmonary embolism. She presents for thrombo lysis EXAM: ULTRASOUND-GUIDED ACCESS LEFT COMMON FEMORAL VEIN X2 PULMONARY ARTERY ANGIOGRAM PLACEMENT OF RIGHT AND LEFT LYTIC CATHETERS FOR INITIATION OF TPA THROMBOLYSIS COMPARISON:  CT 12/04/2019 MEDICATIONS: None ANESTHESIA/SEDATION: Versed 0.5 mg IV; Fentanyl 25 mcg IV Moderate Sedation Time:  30 minutes The patient was continuously monitored during the procedure by the interventional radiology nurse under my direct supervision. FLUOROSCOPY TIME:  Fluoroscopy Time: 9 minutes 18 seconds (67 mGy). COMPLICATIONS: None TECHNIQUE: Informed consent was obtained from the patient and the patient's family following explanation of the procedure, risks, benefits and alternatives. Specific risks include bleeding, infection, contrast reaction, kidney injury, venous injury, life-threatening hemorrhage including brain hemorrhage, gastrointestinal hemorrhage, epistaxis, need for further surgery, need for further procedure, cardiopulmonary collapse, death. The patient understands, agrees and consents for the procedure. All questions were addressed. Patient is position supine position on the fluoroscopy table. Maximal barrier sterile technique utilized including caps, mask, sterile gowns, sterile gloves, large sterile drape, hand hygiene, and betadine prep. 1% lidocaine used for local anesthesia. Moderate sedation was provided. Radiologic shielding of the patient's abdomen/pelvis also performed. Ultrasound survey of the left inguinal region was performed with images stored and sent to PACs. A single wall needle was used access the left common femoral vein under ultrasound. With venous blood flow returned, an 035 wire was passed through the needle into the iliac vein. A 7 French sheath was placed over the wire. The dilator was removed and the sheath was  flushed. Subsequently, a single wall needle was used access the left common femoral vein adjacent to the initial puncture. With venous blood flow returned, a 7 [place] vascular sheath was placed over the wire. The dilator was removed and the sheath was flushed. Combination of a short Kumpe the catheter and a Bentson wire was then used to navigate through the left iliac vein and advanced to the right atrium. Angled pigtail catheter was then used in attempt to navigate into the pulmonary outflow. We then exchanged the pigtail catheter for a Bern catheter 100 cm. This was used to navigate a Glidewire into the main pulmonary artery. Once the Bern catheter was within the pulmonary artery, pressure measurement was performed. 34/14 (25). Glidewire was then advanced into the left-sided pulmonary veins, with selection of lower lobe pulmonary vein. Bern catheter was removed and a 15 cm infusion length, 90 cm working length UniFuse catheter was placed. Small contrast infusion confirmed position. Catheter was flushed. Combination of a short Kumpe the and the Bentson wire were then advanced through the alternative sheath. Exchange of the Kumpe the catheter was performed for the angled pigtail catheter. Again angled pigtail catheter was briefly used to achieve position into the pulmonary artery outflow tract. The pigtail catheter was removed and exchanged for a 100 cm Bern catheter. Once the Bern catheter was in place the Glidewire and the burn were used to select the pulmonary artery of flow, placing the wire into the right main pulmonary artery. Once the catheter and wire were positioned into the lower lobar branches, catheter was removed on the Glidewire. A 15 cm infusion length UniFuse 90 cm working length catheter then placed on the glide wire. Wire  was removed, small amount of contrast confirmed position and catheter was flushed. The obturator wires were then placed through both the left and right catheters. Sheaths were  secured in position.  Final image was stored. The patient tolerated the procedure well and remained hemodynamically stable throughout. No complications were encountered and no significant blood loss was encountered. FINDINGS: Ultrasound survey demonstrates patent left common femoral vein. Main pulmonary artery pressure measures 34/14 (25). The leftward 7 French (red) sheath transmits the left-sided catheter. The right ward 7 French sheath transmits the right-sided pulmonary artery catheter. IMPRESSION: Status post placement of left and right pulmonary artery infusion catheters for initiation of catheter directed thrombolysis. Signed, Yvone Neu. Loreta Ave DO [facility] Radiology PLAN: Patient will be ICU status overnight. Bed rest. Both the right and left catheter will receive 1 mg tPA per hour for 12 hours, for a total dose of 24 mg over 12 hours. Plan for repeat trans duction of catheter pressures after treatment, reassessment, and probable removal of catheters. EVery 6 our blood draw, including CBC, heparin level, and fibrinogen. Electronically Signed   By: Gilmer Mor D.O.   On: 12/05/2019 15:29   IR US Guide Vasc Access Left  Result Date: 12/05/2019 INDICATION: 29 year old female with 9 week pregnancy and submassive pulmonary embolism. She presents for thrombo lysis EXAM: ULTRASOUND-GUIDED ACCESS LEFT COMMON FEMORAL VEIN X2 PULMONARY ARTERY ANGIOGRAM PLACEMENT OF RIGHT AND LEFT LYTIC CATHETERS FOR INITIATION OF TPA THROMBOLYSIS COMPARISON:  CT 12/04/2019 MEDICATIONS: None ANESTHESIA/SEDATION: Versed 0.5 mg IV; Fentanyl 25 mcg IV Moderate Sedation Time:  30 minutes The patient was continuously monitored during the procedure by the interventional radiology nurse under my direct supervision. FLUOROSCOPY TIME:  Fluoroscopy Time: 9 minutes 18 seconds (67 mGy). COMPLICATIONS: None TECHNIQUE: Informed consent was obtained from the patient and the patient's family  following explanation of the procedure, risks, benefits and alternatives. Specific risks include bleeding, infection, contrast reaction, kidney injury, venous injury, life-threatening hemorrhage including brain hemorrhage, gastrointestinal hemorrhage, epistaxis, need for further surgery, need for further procedure, cardiopulmonary collapse, death. The patient understands, agrees and consents for the procedure. All questions were addressed. Patient is position supine position on the fluoroscopy table. Maximal barrier sterile technique utilized including caps, mask, sterile gowns, sterile gloves, large sterile drape, hand hygiene, and betadine prep. 1% lidocaine used for local anesthesia. Moderate sedation was provided. Radiologic shielding of the patient's abdomen/pelvis also performed. Ultrasound survey of the left inguinal region was performed with images stored and sent to PACs. A single wall needle was used access the left common femoral vein under ultrasound. With venous blood flow returned, an 035 wire was passed through the needle into the iliac vein. A 7 French sheath was placed over the wire. The dilator was removed and the sheath was flushed. Subsequently, a single wall needle was used access the left common femoral vein adjacent to the initial puncture. With venous blood flow returned, a 7 [place] vascular sheath was placed over the wire. The dilator was removed and the sheath was flushed. Combination of a short Kumpe the catheter and a Bentson wire was then used to navigate through the left iliac vein and advanced to the right atrium. Angled pigtail catheter was then used in attempt to navigate into the pulmonary outflow. We then exchanged the pigtail catheter for a Bern catheter 100 cm. This was used to navigate a Glidewire into the main pulmonary artery. Once the Bern catheter was within the pulmonary artery, pressure measurement was  performed. 34/14 (25). Glidewire was then advanced into the left-sided  pulmonary veins, with selection of lower lobe pulmonary vein. Bern catheter was removed and a 15 cm infusion length, 90 cm working length UniFuse catheter was placed. Small contrast infusion confirmed position. Catheter was flushed. Combination of a short Kumpe the and the Bentson wire were then advanced through the alternative sheath. Exchange of the Kumpe the catheter was performed for the angled pigtail catheter. Again angled pigtail catheter was briefly used to achieve position into the pulmonary artery outflow tract. The pigtail catheter was removed and exchanged for a 100 cm Bern catheter. Once the Bern catheter was in place the Glidewire and the burn were used to select the pulmonary artery of flow, placing the wire into the right main pulmonary artery. Once the catheter and wire were positioned into the lower lobar branches, catheter was removed on the Glidewire. A 15 cm infusion length UniFuse 90 cm working length catheter then placed on the glide wire. Wire was removed, small amount of contrast confirmed position and catheter was flushed. The obturator wires were then placed through both the left and right catheters. Sheaths were secured in position.  Final image was stored. The patient tolerated the procedure well and remained hemodynamically stable throughout. No complications were encountered and no significant blood loss was encountered. FINDINGS: Ultrasound survey demonstrates patent left common femoral vein. Main pulmonary artery pressure measures 34/14 (25). The leftward 7 French (red) sheath transmits the left-sided catheter. The right ward 7 French sheath transmits the right-sided pulmonary artery catheter. IMPRESSION: Status post placement of left and right pulmonary artery infusion catheters for initiation of catheter directed thrombolysis. Signed, Yvone Neu. Loreta Ave DO [facility] Radiology PLAN: Patient will be ICU status overnight. Bed rest. Both  the right and left catheter will receive 1 mg tPA per hour for 12 hours, for a total dose of 24 mg over 12 hours. Plan for repeat trans duction of catheter pressures after treatment, reassessment, and probable removal of catheters. EVery 6 our blood draw, including CBC, heparin level, and fibrinogen. Electronically Signed   By: Gilmer Mor D.O.   On: 12/05/2019 15:29   DG CHEST PORT 1 VIEW  Result Date: 12/05/2019 CLINICAL DATA:  Chest pain EXAM: PORTABLE CHEST 1 VIEW COMPARISON:  December 04, 2019 FINDINGS: The heart size and mediastinal contours are within normal limits. Both lungs are clear. The visualized skeletal structures are unremarkable. IMPRESSION: No active disease. Electronically Signed   By: Jonna Clark M.D.   On: 12/05/2019 20:27   VAS [place] LOWER EXTREMITY VENOUS (DVT)  Result Date: 12/06/2019  Lower Venous DVTStudy Indications: Pulmonary embolism.  Limitations: Line and bandages. Comparison Study: No prior study Performing Technologist: Gertie Fey MHA, RDMS, RVT, RDCS  Examination Guidelines: A complete evaluation includes B-mode imaging, spectral Doppler, color Doppler, and power Doppler as needed of all accessible portions of each vessel. Bilateral testing is considered an integral part of a complete examination. Limited examinations for reoccurring indications may be performed as noted. The reflux portion of the exam is performed with the patient in reverse Trendelenburg.  +---------+---------------+---------+-----------+----------+--------------+ RIGHT    CompressibilityPhasicitySpontaneityPropertiesThrombus Aging +---------+---------------+---------+-----------+----------+--------------+ FV Prox  Full                                                        +---------+---------------+---------+-----------+----------+--------------+  FV Mid   Full                                                         +---------+---------------+---------+-----------+----------+--------------+ FV DistalFull                                                        +---------+---------------+---------+-----------+----------+--------------+ PFV      Full                                                        +---------+---------------+---------+-----------+----------+--------------+ POP      Full           Yes      Yes                                 +---------+---------------+---------+-----------+----------+--------------+ PTV      Full                                                        +---------+---------------+---------+-----------+----------+--------------+ PERO     Full                                                        +---------+---------------+---------+-----------+----------+--------------+   Right Technical Findings: Not visualized segments include CFV, SFJ due to femoral line and bandaging. Limited evaluation right PTV, peroneal veins.  +---------+---------------+---------+-----------+----------+--------------+ LEFT     CompressibilityPhasicitySpontaneityPropertiesThrombus Aging +---------+---------------+---------+-----------+----------+--------------+ CFV      Full           Yes      Yes                                 +---------+---------------+---------+-----------+----------+--------------+ SFJ      Full                                                        +---------+---------------+---------+-----------+----------+--------------+ FV Prox  Full                                                        +---------+---------------+---------+-----------+----------+--------------+ FV Mid   Full                                                        +---------+---------------+---------+-----------+----------+--------------+  FV DistalFull                                                         +---------+---------------+---------+-----------+----------+--------------+ PFV      Full                                                        +---------+---------------+---------+-----------+----------+--------------+ POP      Full           Yes      Yes                                 +---------+---------------+---------+-----------+----------+--------------+ PTV      Full                                                        +---------+---------------+---------+-----------+----------+--------------+ PERO     Full                                                        +---------+---------------+---------+-----------+----------+--------------+     Summary: RIGHT: - There is no evidence of deep vein thrombosis in the lower extremity. However, portions of this examination were limited- see technologist comments above.  - No cystic structure found in the popliteal fossa.  LEFT: - There is no evidence of deep vein thrombosis in the lower extremity.  - No cystic structure found in the popliteal fossa.  *See table(s) above for measurements and observations. Electronically signed by Coral Else MD on 12/06/2019 at 7:43:58 PM.    Final    IR INFUSION THROMBOL ARTERIAL INITIAL (MS)  Result Date: 12/05/2019 INDICATION: 29 year old female with 9 week pregnancy and submassive pulmonary embolism. She presents for thrombo lysis EXAM: ULTRASOUND-GUIDED ACCESS LEFT COMMON FEMORAL VEIN X2 PULMONARY ARTERY ANGIOGRAM PLACEMENT OF RIGHT AND LEFT LYTIC CATHETERS FOR INITIATION OF TPA THROMBOLYSIS COMPARISON:  CT 12/04/2019 MEDICATIONS: None ANESTHESIA/SEDATION: Versed 0.5 mg IV; Fentanyl 25 mcg IV Moderate Sedation Time:  30 minutes The patient was continuously monitored during the procedure by the interventional radiology nurse under my direct supervision. FLUOROSCOPY TIME:  Fluoroscopy Time: 9 minutes 18 seconds (67 mGy). COMPLICATIONS: None TECHNIQUE: Informed consent was obtained from the patient  and the patient's family following explanation of the procedure, risks, benefits and alternatives. Specific risks include bleeding, infection, contrast reaction, kidney injury, venous injury, life-threatening hemorrhage including brain hemorrhage, gastrointestinal hemorrhage, epistaxis, need for further surgery, need for further procedure, cardiopulmonary collapse, death. The patient understands, agrees and consents for the procedure. All questions were addressed. Patient is position supine position on the fluoroscopy table. Maximal barrier sterile technique utilized including caps, mask, sterile gowns, sterile gloves, large sterile drape, hand hygiene, and betadine prep. 1% lidocaine used for local anesthesia. Moderate sedation was provided. Radiologic  shielding of the patient's abdomen/pelvis also performed. Ultrasound survey of the left inguinal region was performed with images stored and sent to PACs. A single wall needle was used access the left common femoral vein under ultrasound. With venous blood flow returned, an 035 wire was passed through the needle into the iliac vein. A 7 French sheath was placed over the wire. The dilator was removed and the sheath was flushed. Subsequently, a single wall needle was used access the left common femoral vein adjacent to the initial puncture. With venous blood flow returned, a 7 [place] vascular sheath was placed over the wire. The dilator was removed and the sheath was flushed. Combination of a short Kumpe the catheter and a Bentson wire was then used to navigate through the left iliac vein and advanced to the right atrium. Angled pigtail catheter was then used in attempt to navigate into the pulmonary outflow. We then exchanged the pigtail catheter for a Bern catheter 100 cm. This was used to navigate a Glidewire into the main pulmonary artery. Once the Bern catheter was within the pulmonary artery, pressure measurement was performed. 34/14 (25). Glidewire was then  advanced into the left-sided pulmonary veins, with selection of lower lobe pulmonary vein. Bern catheter was removed and a 15 cm infusion length, 90 cm working length UniFuse catheter was placed. Small contrast infusion confirmed position. Catheter was flushed. Combination of a short Kumpe the and the Bentson wire were then advanced through the alternative sheath. Exchange of the Kumpe the catheter was performed for the angled pigtail catheter. Again angled pigtail catheter was briefly used to achieve position into the pulmonary artery outflow tract. The pigtail catheter was removed and exchanged for a 100 cm Bern catheter. Once the Bern catheter was in place the Glidewire and the burn were used to select the pulmonary artery of flow, placing the wire into the right main pulmonary artery. Once the catheter and wire were positioned into the lower lobar branches, catheter was removed on the Glidewire. A 15 cm infusion length UniFuse 90 cm working length catheter then placed on the glide wire. Wire was removed, small amount of contrast confirmed position and catheter was flushed. The obturator wires were then placed through both the left and right catheters. Sheaths were secured in position.  Final image was stored. The patient tolerated the procedure well and remained hemodynamically stable throughout. No complications were encountered and no significant blood loss was encountered. FINDINGS: Ultrasound survey demonstrates patent left common femoral vein. Main pulmonary artery pressure measures 34/14 (25). The leftward 7 French (red) sheath transmits the left-sided catheter. The right ward 7 French sheath transmits the right-sided pulmonary artery catheter. IMPRESSION: Status post placement of left and right pulmonary artery infusion catheters for initiation of catheter directed thrombolysis. Signed, Yvone Neu. Loreta Ave DO [facility] Radiology PLAN: Patient will be ICU  status overnight. Bed rest. Both the right and left catheter will receive 1 mg tPA per hour for 12 hours, for a total dose of 24 mg over 12 hours. Plan for repeat trans duction of catheter pressures after treatment, reassessment, and probable removal of catheters. EVery 6 our blood draw, including CBC, heparin level, and fibrinogen. Electronically Signed   By: Gilmer Mor D.O.   On: 12/05/2019 15:29   IR THROMB F/U EVAL ART/VEN FINAL DAY (MS)  Result Date: 12/06/2019 INDICATION: 29 year old female with sub massive pulmonary embolism, completion lysis EXAM: FOLLOW-UP PULMONARY ARTERIAL LYSIS COMPARISON:  Yesterday exam including initiation CT  chest 12/04/2019 MEDICATIONS: None. ANESTHESIA/SEDATION: None FLUOROSCOPY TIME:  None COMPLICATIONS: None TECHNIQUE: Informed written consent was inferred from the patient after a thorough discussion of the procedural risks, benefits and alternatives. All questions were addressed. Maximal Sterile Barrier Technique was utilized including caps, mask, sterile gowns, sterile gloves, sterile drape, hand hygiene and skin antiseptic. A timeout was performed prior to the initiation of the procedure. Bedside trans duction of the catheter was performed by the interventional radiology team, with pullback of the pulmonary artery catheter and removal of the operator wire. With flow confirmed, pressure was recorded. After pressure was recorded all catheters wires and sheaths were removed. Manual pressure was used for hemostasis and dressing was applied. FINDINGS: 23/6 (13) Initial: 34/14 (25) IMPRESSION: Bedside trans duction of pulmonary artery catheters, and removal of catheters and sheaths. Signed, Yvone Neu. Reyne Dumas, RPVI [facility] Radiology Electronically Signed   By: Gilmer Mor D.O.   On: 12/06/2019 10:52        Scheduled Meds: . Chlorhexidine Gluconate Cloth  6 each Topical Q0600  . famotidine  20 mg Oral QHS  .  mouth rinse  15 mL Mouth Rinse BID  . prenatal multivitamin  1 tablet Oral Q1200  . sodium chloride flush  10-40 mL Intracatheter Q12H  . sodium chloride flush  3 mL Intravenous Q12H  . sodium chloride flush  3 mL Intravenous Q12H   Continuous Infusions: . sodium chloride 125 mL/hr at 12/07/19 0700  . sodium chloride Stopped (12/06/19 0100)  . sodium chloride Stopped (12/06/19 0100)  . sodium chloride    . heparin 1,000 Units/hr (12/07/19 0730)  . magnesium sulfate bolus IVPB 2 g (12/07/19 0831)     LOS: 3 days    Time spent: 40 minutes     Ramiro Harvest, MD [facility]   To contact the attending provider between 7A-7P or the covering provider during after hours 7P-7A, please log into the web site [email or web address] and access using universal [place] password for that web site. If you do not have the password, please call the hospital operator.  12/07/2019, 9:09 AM

## 2019-12-07 NOTE — Progress Notes (Addendum)
[place] for IV heparin>>Lovenox Indication: PE; pregnant  No Known Allergies  Patient Measurements: Height: _0  (170.2 cm) Weight: 67.5 kg (148 lb 13 oz) IBW/kg (Calculated) : 61.6 Heparin Dosing Weight: TBW  Vital Signs: Temp: 98.6 F (37 C) (10/04 0646) Temp Source: Oral (10/04 0646) BP: 100/64 (10/04 0530) Pulse Rate: 65 (10/04 0530)  Labs: Recent Labs     0000 12/04/19 1306 12/04/19 1506 12/05/19 0039 12/05/19 0042 12/05/19 0738 12/05/19 1042 12/05/19 1920 12/05/19 1936 12/06/19 0115 12/06/19 0115 12/06/19 0320 12/06/19 0459 12/06/19 0900 12/06/19 1320 12/06/19 1511 12/07/19 0412  HGB  --  12.1  --   --   --   --    < > 9.8*   < > 8.6*   < >  --   --   --  8.6*  --  7.9*  HCT  --  34.8*  --   --   --   --    < > 28.0*   < > 25.3*  --   --   --   --  24.7*  --  22.2*  PLT  --  217  --   --   --   --    < > 162   < > 145*  --   --   --   --  147*  --  146*  HEPARINUNFRC  --   --   --    < >  --   --    < >  --   --   --   --    < >  --  0.49  --  0.40 0.27*  CREATININE   < > 0.86  --   --  0.80  --   --  0.76  --   --   --   --  0.69  --   --   --   --   TROPONINIHS  --  272* 1,185*  --   --  616*  --   --   --   --   --   --   --   --   --   --   --    < > = values in this interval not displayed.    Estimated Creatinine Clearance: 100 mL/min (by C-G formula based on SCr of 0.69 mg/dL).   Assessment: 60 yoF with PMH sickle cell trait, currently [redacted] wks pregnant, presents with sudden onset SOB and LOC earlier today. Found to have extensive bilat PE with RH strain present; troponins elevated; BNP not drawn. Pharmacy to dose IV heparin.    Baseline INR, aPTT: not done  Prior anticoagulation: none  Significant events:  Today, 12/07/2019: 7:08 AM   Heparin level = 0.27 is slightly subtherapeutic with heparin @ 850 units/hr  Heparin infusing w/ no issues per RN  S/p B/L TPA over 12 hours completed 10/3  Hg down to  7.9,  PTC 146 low  No more bleeding reported  To transition UFH> LMWH for anticoag during pregnancy as DOACs are contraindicated   Goal of Therapy: Heparin level 0.3-0.7 units/ml Monitor platelets by anticoagulation protocol: Yes  Plan: Dc heparin drip  after 1 hour start full dose LMWH 1 mg/kg sq q12h = 70 mg q12 (can't do 1.5 mg/kg/day due to increased clearance in pregnancy) Lovenox DC kit ordered for pt RN to teach SQ injection technique CM to assist w/ any needs for home LMWH  Pharmacy to sign off  Eudelia Bunch, Pharm.D 12/07/2019 7:32 AM

## 2019-12-07 NOTE — Progress Notes (Signed)
NAME:  Tasha [name], MRN:  [id number], DOB:  [birth year], LOS: 3 ADMISSION DATE:  12/04/2019, CONSULTATION DATE: 12/04/2019 REFERRING MD: Dr. Bernette Mayers, CHIEF COMPLAINT: Chest pain, shortness of breath,  Brief History   [redacted] weeks pregnant, submassive PE, hypotension overnight  History of present illness   Noted to have bilateral PEs with right heart strain Post TPA which he tolerated okay did have some episodes with PVCs and chest discomfort  Past Medical History   Past Medical History:  Diagnosis Date  . Abnormal Pap smear 12/10/2011  . Anemia   . Anxiety 2010   anxiety attack;no meds;has not been dx'd  . Emotional instability (HCC)    Break down easily when things are not going her way  . GERD (gastroesophageal reflux disease)   . Hx of joint problems    Shoulders and knees have pain  . Infection    Yeast;would get frequently  . Infection    BV x 1  . Kidney infection 2010   PO antibxs  . Kidney stone complicating pregnancy   . Migraines    no rx'd meds in the past;goes to sleep  . PCOS (polycystic ovarian syndrome)   . Sickle cell trait (HCC)   . Sickle cell trait (HCC)   . Umbilical hernia 1991   Does not cause anyp problems  . Vaginal Pap smear, abnormal      Significant Hospital Events   Hypotension 12/05/2019  Consults:  PCCM Interventional radiology  Procedures:  Arterial line placement 10/2 Central venous access 10/2 Thrombolysis 10/2  Significant Diagnostic Tests:  CT scan of the chest showing bilateral PE with extensive clot burden Elevated troponins  Micro Data:  None  Antimicrobials:  None  Interim history/subjective:   Some headache reported overnight No dyspnea but she has noticed tachycardia whenever she moves about in the bed Tolerating diet Some transient hypotension this morning while sleeping, currently hemodynamically stable  Objective   Blood pressure 100/64, pulse 65, temperature 97.9 F (36.6 C), temperature source Oral,  resp. rate 14, height 5\' 7"  (1.702 m), weight 67.5 kg, last menstrual period 09/24/2019, SpO2 100 %, unknown if currently breastfeeding.        Intake/Output Summary (Last 24 hours) at 12/07/2019 1031 Last data filed at 12/07/2019 0800 Gross per 24 hour  Intake 3423.03 ml  Output 3300 ml  Net 123.03 ml   Filed Weights   12/05/19 0500 12/06/19 0459 12/07/19 0416  Weight: 62.6 kg 64.8 kg 67.5 kg    Examination: Well-appearing woman, comfortable in bed, no distress Oropharynx clear Lungs clear bilaterally, no wheezing or crackles Regular, no murmur Abdomen nondistended with positive bowel sounds No significant edema   Resolved Hospital Problem list     Assessment & Plan:  Submassive pulmonary embolism, underwent targeted lysis via catheter by IR.  Now weaned to room air and hemodynamically improved. -Currently on heparin infusion.  Should be able to transition to enoxaparin.  She will need this through the pregnancy and probably 6 weeks postpartum -Begin mobilization, up to chair, PT etc. -Likely repeat her echocardiogram around the time she completes her anticoagulation to ensure improvement in her right heart function  Sickle cell trait  High risk pregnancy -We will need close OB follow-up outpatient   Best practice:  Diet: Regular diet Pain/Anxiety/Delirium protocol (if indicated): Morphine as needed, Tylenol as needed VAP protocol (if indicated): Not indicated DVT prophylaxis: On full dose anticoagulation with heparin GI prophylaxis: Pepcid Glucose control: Not indicated Mobility: Bedrest Code Status: Full  code Family Communication: Patient fully aware Disposition: ICU  Labs   CBC: Recent Labs  Lab 12/05/19 1128 12/05/19 1128 12/05/19 1920 12/05/19 1936 12/05/19 2224 12/06/19 0009 12/06/19 0115 12/06/19 1320 12/07/19 0412  WBC 5.8  --  6.8  --   --   --  7.0 6.7 5.4  HGB 9.8*   < > 9.8*   < > 9.3* 8.4* 8.6* 8.6* 7.9*  HCT 28.3*   < > 28.0*   < > 27.7*  24.3* 25.3* 24.7* 22.2*  MCV 79.3*  --  78.9*  --   --   --  80.1 78.9* 79.0*  PLT 179  --  162  --   --   --  145* 147* 146*   < > = values in this interval not displayed.    Basic Metabolic Panel: Recent Labs  Lab 12/04/19 1306 12/04/19 1306 12/05/19 0042 12/05/19 1920 12/05/19 1936 12/05/19 1949 12/06/19 0459 12/07/19 0412 12/07/19 0800  NA 134*   < > 135 136 136  --  135  --  135  K 3.3*   < > 3.8 3.5 3.4*  --  3.7  --  3.7  CL 103  --  105 109  --   --  111  --  109  CO2 22  --  21* 21*  --   --  19*  --  20*  GLUCOSE 112*  --  91 96  --   --  83  --  81  BUN 11  --  9 7  --   --  6  --  <5*  CREATININE 0.86  --  0.80 0.76  --   --  0.69  --  0.63  CALCIUM 8.9  --  8.5* 8.4*  --   --  7.6*  --  7.7*  MG  --   --   --   --   --  2.2 1.7 1.9  --   PHOS  --   --   --   --   --   --  2.9  --   --    < > = values in this interval not displayed.   GFR: Estimated Creatinine Clearance: 100 mL/min (by C-G formula based on SCr of 0.63 mg/dL). Recent Labs  Lab 12/05/19 1920 12/06/19 0115 12/06/19 1320 12/07/19 0412  WBC 6.8 7.0 6.7 5.4    Liver Function Tests: Recent Labs  Lab 12/05/19 0042  AST 21  ALT 12  ALKPHOS 38  BILITOT 0.7  PROT 6.4*  ALBUMIN 3.5   No results for input(s): LIPASE, AMYLASE in the last 168 hours. No results for input(s): AMMONIA in the last 168 hours.  ABG    Component Value Date/Time   PHART 7.460 (H) 12/05/2019 1936   PCO2ART 26.2 (L) 12/05/2019 1936   PO2ART 170 (H) 12/05/2019 1936   HCO3 18.5 (L) 12/05/2019 1936   TCO2 19 (L) 12/05/2019 1936   ACIDBASEDEF 4.0 (H) 12/05/2019 1936   O2SAT 100.0 12/05/2019 1936     Coagulation Profile: No results for input(s): INR, PROTIME in the last 168 hours.  Cardiac Enzymes: No results for input(s): CKTOTAL, CKMB, CKMBINDEX, TROPONINI in the last 168 hours.  HbA1C: No results found for: HGBA1C  CBG: Recent Labs  Lab 12/07/19 0820  GLUCAP 79    PCCM will sign off.  Please call if  we can assist.  Levy Pupa, MD, PhD 12/07/2019, 10:36 AM [facility]  [phone] or if no answer [phone]

## 2019-12-07 NOTE — TOC Initial Note (Signed)
Transition of Care [facility]) - Initial/Assessment Note    Patient Details  Name: Tasha [name] MRN: [id number] Date of Birth: Nov 01, 1990  [facility] CM/SW Contact:    Golda Acre, RN Phone Number: 12/07/2019, 9:09 AM  Clinical Narrative:                 Chrisandra Carota pulmonary embolism -S/p thrombolytics -Pain and discomfort symptoms are well controlled this morning -Saturating well -She removed this morning -Appreciate interventional radiology assistance  Hemodynamics stable at present  Sickle cell trait  High risk pregnancy  Has no overt bleeding, H&H stable  Plan will be to transition to Lovenox for management of PE through the pregnancy High risk pregnancy, lives [place], following for progression, toc needs Expected Discharge Plan: Home/Self Care Barriers to Discharge: Continued Medical Work up   Patient Goals and CMS Choice Patient states their goals for this hospitalization and ongoing recovery are:: to go back home CMS Medicare.gov Compare Post Acute Care list provided to:: Patient Choice offered to / list presented to : Patient  Expected Discharge Plan and Services Expected Discharge Plan: Home/Self Care       Living arrangements for the past 2 months: Single Family Home                                      Prior Living Arrangements/Services Living arrangements for the past 2 months: Single Family Home Lives with:: Self Patient language and need for interpreter reviewed:: Yes Do you feel safe going back to the place where you live?: Yes      Need for Family Participation in Patient Care: Yes (Comment) Care giver support system in place?: Yes (comment)   Criminal Activity/Legal Involvement Pertinent to Current Situation/Hospitalization: No - Comment as needed  Activities of Daily Living Home Assistive Devices/Equipment: None ADL Screening (condition at time of admission) Patient's cognitive ability adequate to safely  complete daily activities?: Yes Is the patient deaf or have difficulty hearing?: No Does the patient have difficulty seeing, even when wearing glasses/contacts?: No Does the patient have difficulty concentrating, remembering, or making decisions?: No Patient able to express need for assistance with ADLs?: Yes Does the patient have difficulty dressing or bathing?: Yes Independently performs ADLs?: No Communication: Independent Dressing (OT): Needs assistance Is this a change from baseline?: Change from baseline, expected to last >3 days Grooming: Independent Feeding: Independent Bathing: Needs assistance Is this a change from baseline?: Change from baseline, expected to last >3 days Toileting: Needs assistance Is this a change from baseline?: Change from baseline, expected to last >3days In/Out Bed: Needs assistance Is this a change from baseline?: Change from baseline, expected to last >3 days Walks in Home: Needs assistance Is this a change from baseline?: Change from baseline, expected to last >3 days Does the patient have difficulty walking or climbing stairs?: Yes Weakness of Legs: Both Weakness of Arms/Hands: Both  Permission Sought/Granted                  Emotional Assessment Appearance:: Appears stated age Attitude/Demeanor/Rapport: Engaged Affect (typically observed): Calm Orientation: : Oriented to  Time, Oriented to Place, Oriented to Self, Oriented to Situation Alcohol / Substance Use: Not Applicable Psych Involvement: No (comment)  Admission diagnosis:  Bilateral pulmonary embolism (HCC) [I26.99] Other acute pulmonary embolism, unspecified whether acute cor pulmonale present (HCC) [I26.99] Patient Active Problem List   Diagnosis Date Noted  .  Hypokalemia   . Bilateral pulmonary embolism (HCC) 12/04/2019  . High-risk pregnancy in first trimester   . SVD (spontaneous vaginal delivery) 02/21/2018  . Anemia 12/21/2017  . Pregnant 07/24/2017  . Sickle cell  trait (HCC) 07/21/2017  . Anxiety 10/03/2016  . Gastroesophageal reflux disease 10/03/2016  . Normal labor 12/28/2013  . Post term pregnancy, antepartum condition or complication 06/18/2012  . Abnormal Pap smear 12/10/2011  . Umbilical hernia 11/29/2011  . H/O pyelonephritis 11/29/2011   PCP:  Janeece Agee, NP Pharmacy:   [facility] [address] [place] - [address] [place] [address] Phone: [phone] Fax: [phone]     Social Determinants of Health (SDOH) Interventions    Readmission Risk Interventions No flowsheet data found.

## 2019-12-08 ENCOUNTER — Telehealth: Payer: Self-pay | Admitting: Emergency Medicine

## 2019-12-08 LAB — CBC
HCT: 24.1 % — ABNORMAL LOW (ref 36.0–46.0)
Hemoglobin: 8.3 g/dL — ABNORMAL LOW (ref 12.0–15.0)
MCH: 27.5 pg (ref 26.0–34.0)
MCHC: 34.4 g/dL (ref 30.0–36.0)
MCV: 79.8 fL — ABNORMAL LOW (ref 80.0–100.0)
Platelets: 155 10*3/uL (ref 150–400)
RBC: 3.02 MIL/uL — ABNORMAL LOW (ref 3.87–5.11)
RDW: 13.1 % (ref 11.5–15.5)
WBC: 5.4 10*3/uL (ref 4.0–10.5)
nRBC: 0 % (ref 0.0–0.2)

## 2019-12-08 LAB — BASIC METABOLIC PANEL
Anion gap: 11 (ref 5–15)
BUN: 6 mg/dL (ref 6–20)
CO2: 20 mmol/L — ABNORMAL LOW (ref 22–32)
Calcium: 8.2 mg/dL — ABNORMAL LOW (ref 8.9–10.3)
Chloride: 105 mmol/L (ref 98–111)
Creatinine, Ser: 0.63 mg/dL (ref 0.44–1.00)
GFR calc Af Amer: >60 mL/min (ref >60)
GFR calc non Af Amer: >60 mL/min (ref >60)
Glucose, Bld: 82 mg/dL (ref 70–99)
Potassium: 3.6 mmol/L (ref 3.5–5.1)
Sodium: 136 mmol/L (ref 135–145)

## 2019-12-08 LAB — GLUCOSE, CAPILLARY: Glucose-Capillary: 97 mg/dL (ref 70–99)

## 2019-12-08 LAB — MAGNESIUM: Magnesium: 1.9 mg/dL (ref 1.7–2.4)

## 2019-12-08 MED ORDER — POTASSIUM CHLORIDE CRYS ER 20 MEQ PO TBCR
40.0000 meq | EXTENDED_RELEASE_TABLET | Freq: Once | ORAL | Status: AC
  Administered 2019-12-08: 40 meq via ORAL
  Filled 2019-12-08: qty 2

## 2019-12-08 NOTE — Telephone Encounter (Signed)
Tried calling the pt to schedule appt, there was no answer had to [facility] Will forward to front desk pool for further f/u

## 2019-12-08 NOTE — Progress Notes (Signed)
PROGRESS NOTE    Tasha Wu  ZOX:[id number] DOB: [birth year] DOA: 12/04/2019 PCP: Janeece Wu, [name], NP    Chief Complaint  Patient presents with  . Chest Pain  . Shortness of Breath  . Loss of Consciousness    Brief Narrative:  Patient is a pleasant 29 year old female presenting with likely pregnancy related submassive bilateral PE, placed in the ICU placed on IV heparin, overnight noted to have episodes of hypotension, patient also noted with elevated cardiac enzymes, concern for cardiovascular instability secondary to submassive bilateral PE.  PCCM consulted.  IR consulted.  Patient being transferred to [facility] for evaluation for systemic IV lytic therapy versus catheter directed therapies.   Assessment & Plan:   Principal Problem:   Bilateral pulmonary embolism (HCC) Active Problems:   High-risk pregnancy in first trimester   Anemia   Anxiety   Gastroesophageal reflux disease   Pregnant   Hypokalemia   1  Bilateral submassive PE Likely pregnancy related.  Patient with no family history of prior history of DVT or PE.  Patient with no recent surgeries or long travel.  Patient noted overnight to be hypotensive, with some dizziness, symptomatic.  Cardiac enzymes were trending up on admission.  2D echo with right ventricular strain.  Lower extremity Dopplers negative.  Patient underwent thrombolytics per IR due to hemodynamic instability and right ventricular strain with clinical improvement.  Patient with borderline blood pressure this morning with systolics in the 90s.  IV fluids have been discontinued.  Patient denies any chest pain or shortness of breath.  Patient denies any dizziness this morning.  Headaches noted 1 day ago improved on Tylenol  Sheaths were removed.  IR following.  Patient has been transitioned from IV heparin to Lovenox.  PCCM recommending continuation of Lovenox for management of PE through pregnancy up until 6 weeks postpartum.  Mobilize.  PT  evaluation pending.  Will need close outpatient follow-up with pulmonary which will be arranged prior to discharge.  Patient has been called by pulmonary office for outpatient follow-up.  Transfer to progressive care unit.    2.  High risk pregnancy On presentation patient noted to be [redacted] weeks pregnant.  Patient now with bilateral pulmonary emboli.  Patient with history of sickle cell trait.  Patient high risk pregnancy.  2D echo pending.  Lower extremity Dopplers negative for DVT.  Case was discussed on admission with OB/GYN, Dr.Pinn who recommended viability ultrasound.  Ultrasound of the pelvis done with single intrauterine gestation with a gestational age of [redacted] weeks 0 days by crown-rump length sonographic estimation, small-volume subchorionic hemorrhage as well as borderline fetal tachycardia.  OB/GYN consulted and patient seen by Dr. Mora ApplPinn on 12/05/2019 who had discussed importance of moving forward with catheter directed thrombolysis versus TPA with patient, which patient subsequently underwent and recommended close outpatient follow-up.   3.  Gastroesophageal reflux disease Continue Pepcid.  4.  Hypokalemia Potassium at 3.6.  K. Dur 40 mEq p.o. x1.  Magnesium at 1.9.  Follow.  5.  Anemia Patient with no overt bleeding.  Likely dilutional.  Patient however underwent thrombolysis and on IV heparin.  H&H currently stable at 8.3.  IV fluids have been discontinued.  Follow.    DVT prophylaxis: Heparin Code Status: Full Family Communication: Updated patient.  No family at bedside.  Disposition:   Status is: Inpatient    Dispo: The patient is from: Home              Anticipated d/c is to: Home  Anticipated d/c date is: To be determined              Patient currently status post thrombolysis for bilateral PE with right ventricular strain.  Patient with borderline blood pressure this morning.  Transfer to progressive care unit versus telemetry.  Not stable for discharge.         Consultants:   PCCM: Dr.Olalere 12/04/2019  OB/GYN: Dr.Pinn--12/05/2019  Interventional radiology: Dr. Loreta Ave 12/05/2019  Procedures:   CT angiogram chest 12/04/2019  Chest x-ray 12/04/2019  Viability pelvic ultrasound 12/04/2019  2D echo 12/05/2019  Lower extremity Dopplers 12/04/2019  Ultrasound-guided access left common femoral vein x2/pulmonary artery angiogram/placement of right and left lytic catheters for initiation of TPA thrombolysis per IR, Dr. Loreta Ave 12/05/2019  Bedside transduction of pulmonary artery catheters and removal of catheters and sheaths per IR, Dr. Loreta Ave 12/06/2019  Arterial line placement 12/05/2019  Central venous access 12/05/2019  Antimicrobials:   None   Subjective: Patient sitting up in bed on the telephone.  Denies any chest pain.  Denies any significant shortness of breath.  Stated ambulated yesterday to the bathroom without any dizziness or lightheadedness.  Denies any bleeding.  Tolerating current diet.  Gaseous feeling improved on simethicone.  Clinically feeling better.  Blood pressure noted to be borderline this morning with systolics in the 90s.  Patient stated was able to give herself Lovenox last night.  Objective: Vitals:   12/08/19 0400 12/08/19 0500 12/08/19 0600 12/08/19 0843  BP: (!) 97/47     Pulse: 68 68 70   Resp: 19 19 17    Temp:    98.1 F (36.7 C)  TempSrc:    Oral  SpO2: 100% 100% 100%   Weight:   66.6 kg   Height:        Intake/Output Summary (Last 24 hours) at 12/08/2019 0947 Last data filed at 12/08/2019 0600 Gross per 24 hour  Intake 613.45 ml  Output 1100 ml  Net -486.55 ml   Filed Weights   12/06/19 0459 12/07/19 0416 12/08/19 0600  Weight: 64.8 kg 67.5 kg 66.6 kg    Examination:  General exam: NAD.  Respiratory system: Lungs clear to auscultation bilaterally.  No wheezes, no crackles, no rhonchi.  Normal respiratory effort.  Cardiovascular system: RRR no murmurs rubs or gallops.  No JVD.  No lower  extremity edema.   Gastrointestinal system: Abdomen is soft, nontender, nondistended, positive bowel sounds.  No rebound.  No guarding. Central nervous system: Alert and oriented.  Moving extremities spontaneously.  No focal neurological deficits.  Extremities: Symmetric 5 x 5 power. Skin: No rashes, lesions or ulcers Psychiatry: Judgement and insight appear normal. Mood & affect appropriate.     Data Reviewed: I have personally reviewed following labs and imaging studies  CBC: Recent Labs  Lab 12/05/19 1920 12/05/19 1936 12/06/19 0115 12/06/19 1320 12/07/19 0412 12/07/19 1414 12/08/19 0312  WBC 6.8  --  7.0 6.7 5.4  --  5.4  HGB 9.8*   < > 8.6* 8.6* 7.9* 8.3* 8.3*  HCT 28.0*   < > 25.3* 24.7* 22.2* 23.8* 24.1*  MCV 78.9*  --  80.1 78.9* 79.0*  --  79.8*  PLT 162  --  145* 147* 146*  --  155   < > = values in this interval not displayed.    Basic Metabolic Panel: Recent Labs  Lab 12/05/19 0042 12/05/19 0042 12/05/19 1920 12/05/19 1936 12/05/19 1949 12/06/19 0459 12/07/19 0412 12/07/19 0800 12/08/19 0312  NA 135   < >  136 136  --  135  --  135 136  K 3.8   < > 3.5 3.4*  --  3.7  --  3.7 3.6  CL 105  --  109  --   --  111  --  109 105  CO2 21*  --  21*  --   --  19*  --  20* 20*  GLUCOSE 91  --  96  --   --  83  --  81 82  BUN 9  --  7  --   --  6  --  <5* 6  CREATININE 0.80  --  0.76  --   --  0.69  --  0.63 0.63  CALCIUM 8.5*  --  8.4*  --   --  7.6*  --  7.7* 8.2*  MG  --   --   --   --  2.2 1.7 1.9  --  1.9  PHOS  --   --   --   --   --  2.9  --   --   --    < > = values in this interval not displayed.    GFR: Estimated Creatinine Clearance: 100 mL/min (by C-G formula based on SCr of 0.63 mg/dL).  Liver Function Tests: Recent Labs  Lab 12/05/19 0042  AST 21  ALT 12  ALKPHOS 38  BILITOT 0.7  PROT 6.4*  ALBUMIN 3.5    CBG: Recent Labs  Lab 12/07/19 0820 12/08/19 0824  GLUCAP 79 97     Recent Results (from the past 240 hour(s))    Respiratory Panel by RT PCR (Flu A&B, Covid) - Nasopharyngeal Swab     Status: None   Collection Time: 12/04/19  5:58 PM   Specimen: Nasopharyngeal Swab  Result Value Ref Range Status   SARS Coronavirus 2 by RT PCR NEGATIVE NEGATIVE Final    Comment: (NOTE) SARS-CoV-2 target nucleic acids are NOT DETECTED.  The SARS-CoV-2 RNA is generally detectable in upper respiratoy specimens during the acute phase of infection. The lowest concentration of SARS-CoV-2 viral copies this assay can detect is 131 copies/mL. A negative result does not preclude SARS-Cov-2 infection and should not be used as the sole basis for treatment or other patient management decisions. A negative result may occur with  improper specimen collection/handling, submission of specimen other than nasopharyngeal swab, presence of viral mutation(s) within the areas targeted by this assay, and inadequate number of viral copies (<131 copies/mL). A negative result must be combined with clinical observations, patient history, and epidemiological information. The expected result is Negative.  Fact Sheet for Patients:  https://www.moore.com/  Fact Sheet for Healthcare Providers:  https://www.young.biz/  This test is no t yet approved or cleared by the [place] FDA and  has been authorized for detection and/or diagnosis of SARS-CoV-2 by FDA under an Emergency Use Authorization (EUA). This EUA will remain  in effect (meaning this test can be used) for the duration of the COVID-19 declaration under Section 564(b)(1) of the Act, 21 U.S.C. section 360bbb-3(b)(1), unless the authorization is terminated or revoked sooner.     Influenza A by PCR NEGATIVE NEGATIVE Final   Influenza B by PCR NEGATIVE NEGATIVE Final    Comment: (NOTE) The Xpert Xpress SARS-CoV-2/FLU/RSV assay is intended as an aid in  the diagnosis of influenza from Nasopharyngeal swab specimens and  should not be used as  a sole basis for treatment. Nasal washings and  aspirates are unacceptable for  Xpert Xpress SARS-CoV-2/FLU/RSV  testing.  Fact Sheet for Patients: https://www.moore.com/  Fact Sheet for Healthcare Providers: https://www.young.biz/  This test is not yet approved or cleared by the [place] FDA and  has been authorized for detection and/or diagnosis of SARS-CoV-2 by  FDA under an Emergency Use Authorization (EUA). This EUA will remain  in effect (meaning this test can be used) for the duration of the  Covid-19 declaration under Section 564(b)(1) of the Act, 21  U.S.C. section 360bbb-3(b)(1), unless the authorization is  terminated or revoked. Performed at [facility], [address], [place], [place] [address]   MRSA PCR Screening     Status: None   Collection Time: 12/04/19  7:12 PM   Specimen: Nasal Mucosa; Nasopharyngeal  Result Value Ref Range Status   MRSA by PCR NEGATIVE NEGATIVE Final    Comment:        The GeneXpert MRSA Assay (FDA approved for NASAL specimens only), is one component of a comprehensive MRSA colonization surveillance program. It is not intended to diagnose MRSA infection nor to guide or monitor treatment for MRSA infections. Performed at [facility], [address], [place], [place] [address]   Urine culture     Status: Abnormal   Collection Time: 12/05/19  2:55 AM   Specimen: Urine, Clean Catch  Result Value Ref Range Status   Specimen Description   Final    URINE, CLEAN CATCH Performed at [facility], [address] [place], [place] [address]    Special Requests   Final    NONE Performed at [facility], [address] [place], [place] [address]    Culture (A)  Final    >=100,000 COLONIES/mL MULTIPLE SPECIES PRESENT, SUGGEST RECOLLECTION   Report Status 12/06/2019 FINAL  Final         Radiology Studies: No results  found.      Scheduled Meds: . Chlorhexidine Gluconate Cloth  6 each Topical Q0600  . enoxaparin (LOVENOX) injection  70 mg Subcutaneous Q12H  . enoxaparin   Does not apply Once  . famotidine  20 mg Oral QHS  . mouth rinse  15 mL Mouth Rinse BID  . prenatal multivitamin  1 tablet Oral Q1200  . simethicone  160 mg Oral TID  . sodium chloride flush  3 mL Intravenous Q12H  . sodium chloride flush  3 mL Intravenous Q12H   Continuous Infusions: . sodium chloride Stopped (12/06/19 0100)  . sodium chloride Stopped (12/06/19 0100)  . sodium chloride       LOS: 4 days    Time spent: 40 minutes     Ramiro Harvest, MD [facility]   To contact the attending provider between 7A-7P or the covering provider during after hours 7P-7A, please log into the web site [email or web address] and access using universal [place] password for that web site. If you do not have the password, please call the hospital operator.  12/08/2019, 9:47 AM

## 2019-12-08 NOTE — Progress Notes (Signed)
OT Cancellation Note  Patient Details Name: Tasha Wu MRN: [id number] DOB: 1990-10-14   Cancelled Treatment:    Reason Eval/Treat Not Completed: Other (comment)  RN reported pt is mod I with ADL activity . Will sign off Tasha Wu, [facility] [phone] Office- [phone]     Tasha Wu, [facility] 12/08/2019, 2:18 PM

## 2019-12-08 NOTE — Evaluation (Addendum)
Physical Therapy Evaluation-1x Patient Details Name: Tasha Wu MRN: [id number] DOB: Aug 12, 1990 Today's Date: 12/08/2019   History of Present Illness  29 yo female admitted with bil PE with R heart strain. S/P tPA thrombolysis by IR 10/2. Pt is also currently pregnant per chart.  Clinical Impression  On eval, pt was Supv-Mod Ind with mobility. She walked ~100 feet in the hallway. Pt denied lightheadedness/dizziness. No LOB with activity. She did c/o her feet feeling tight and swollen. Encouraged pt to speak with nursing about walking with her in the hallway and allowing her to walk to/from bathroom instead of using bsc. No acute PT needs. 1x eval .Will sign off.     Follow Up Recommendations No PT follow up    Equipment Recommendations  None recommended by PT    Recommendations for Other Services       Precautions / Restrictions Restrictions Weight Bearing Restrictions: No      Mobility  Bed Mobility               General bed mobility comments: oob in recliner  Transfers Overall transfer level: Modified independent                  Ambulation/Gait Ambulation/Gait assistance: Supervision Gait Distance (Feet): 100 Feet Assistive device: None Gait Pattern/deviations: Step-through pattern;Decreased stride length     General Gait Details: slow gait speed. pt c/o feet feeling swollen, tight. she denied lightheadedness/dizziness. O2 >90% on RA.  Stairs            Wheelchair Mobility    Modified Rankin (Stroke Patients Only)       Balance Overall balance assessment: Mild deficits observed, not formally tested                                           Pertinent Vitals/Pain Pain Assessment: Faces Faces Pain Scale: Hurts a little bit Pain Location: bil feet Pain Descriptors / Indicators: Tightness Pain Intervention(s): Limited activity within patient's tolerance;Monitored during session    Home Living Family/patient  expects to be discharged to:: Private residence Living Arrangements: Spouse/significant other;Children             Home Equipment: None      Prior Function Level of Independence: Independent               Hand Dominance        Extremity/Trunk Assessment   Upper Extremity Assessment Upper Extremity Assessment: Overall WFL for tasks assessed    Lower Extremity Assessment Lower Extremity Assessment: Overall WFL for tasks assessed    Cervical / Trunk Assessment Cervical / Trunk Assessment: Normal  Communication   Communication: No difficulties  Cognition Arousal/Alertness: Awake/alert Behavior During Therapy: WFL for tasks assessed/performed Overall Cognitive Status: Within Functional Limits for tasks assessed                                        General Comments      Exercises     Assessment/Plan    PT Assessment Patent does not need any further PT services  PT Problem List         PT Treatment Interventions      PT Goals (Current goals can be found in the Care Plan section)  Acute Rehab PT Goals Patient  Stated Goal: home soon. regain PLOF. PT Goal Formulation: All assessment and education complete, DC therapy    Frequency     Barriers to discharge        Co-evaluation               AM-PAC PT "6 Clicks" Mobility  Outcome Measure Help needed turning from your back to your side while in a flat bed without using bedrails?: None Help needed moving from lying on your back to sitting on the side of a flat bed without using bedrails?: None Help needed moving to and from a bed to a chair (including a wheelchair)?: None Help needed standing up from a chair using your arms (e.g., wheelchair or bedside chair)?: None Help needed to walk in hospital room?: None Help needed climbing 3-5 steps with a railing? : None 6 Click Score: 24    End of Session   Activity Tolerance: Patient tolerated treatment well Patient left: in  chair;with call bell/phone within reach   PT Visit Diagnosis: Unsteadiness on feet (R26.81)    Time: 6606-3016 PT Time Calculation (min) (ACUTE ONLY): 11 min   Charges:   PT Evaluation $PT Eval Low Complexity: 1 Low           Faye Ramsay, PT Acute Rehabilitation  Office: [phone] Pager: [phone]

## 2019-12-08 NOTE — Telephone Encounter (Signed)
Patient needs to be set up as a hosp f/u / new patient for any priovider. Dx is Pulmonary embolism. Thanks.

## 2019-12-09 LAB — BASIC METABOLIC PANEL
Anion gap: 8 (ref 5–15)
BUN: 9 mg/dL (ref 6–20)
CO2: 22 mmol/L (ref 22–32)
Calcium: 8.7 mg/dL — ABNORMAL LOW (ref 8.9–10.3)
Chloride: 104 mmol/L (ref 98–111)
Creatinine, Ser: 0.67 mg/dL (ref 0.44–1.00)
GFR calc non Af Amer: >60 mL/min (ref >60)
Glucose, Bld: 81 mg/dL (ref 70–99)
Potassium: 3.8 mmol/L (ref 3.5–5.1)
Sodium: 134 mmol/L — ABNORMAL LOW (ref 135–145)

## 2019-12-09 LAB — CBC
HCT: 25.8 % — ABNORMAL LOW (ref 36.0–46.0)
Hemoglobin: 8.9 g/dL — ABNORMAL LOW (ref 12.0–15.0)
MCH: 27.3 pg (ref 26.0–34.0)
MCHC: 34.5 g/dL (ref 30.0–36.0)
MCV: 79.1 fL — ABNORMAL LOW (ref 80.0–100.0)
Platelets: 183 10*3/uL (ref 150–400)
RBC: 3.26 MIL/uL — ABNORMAL LOW (ref 3.87–5.11)
RDW: 13.2 % (ref 11.5–15.5)
WBC: 6.1 10*3/uL (ref 4.0–10.5)
nRBC: 0 % (ref 0.0–0.2)

## 2019-12-09 LAB — MAGNESIUM: Magnesium: 1.7 mg/dL (ref 1.7–2.4)

## 2019-12-09 LAB — GLUCOSE, CAPILLARY: Glucose-Capillary: 83 mg/dL (ref 70–99)

## 2019-12-09 MED ORDER — ENOXAPARIN SODIUM 80 MG/0.8ML ~~LOC~~ SOLN: 70.0000 mg | Freq: Two times a day (BID) | SUBCUTANEOUS | 2 refills | Status: DC

## 2019-12-09 NOTE — Discharge Instructions (Signed)
Pulmonary Embolism  A pulmonary embolism (PE) is a sudden blockage or decrease of blood flow in one or both lungs. Most blockages come from a blood clot that forms in the vein of a lower leg, thigh, or arm (deep vein thrombosis, DVT) and travels to the lungs. A clot is blood that has thickened into a gel or solid. PE is a dangerous and life-threatening condition that needs to be treated right away. What are the causes? This condition is usually caused by a blood clot that forms in a vein and moves to the lungs. In rare cases, it may be caused by air, fat, part of a tumor, or other tissue that moves through the veins and into the lungs. What increases the risk? The following factors may make you more likely to develop this condition:  Experiencing a traumatic injury, such as breaking a hip or leg.  Having: ? A spinal cord injury. ? Orthopedic surgery, especially hip or knee replacement. ? Any major surgery. ? A stroke. ? DVT. ? Blood clots or blood clotting disease. ? Long-term (chronic) lung or heart disease. ? Cancer treated with chemotherapy. ? A central venous catheter.  Taking medicines that contain estrogen. These include birth control pills and hormone replacement therapy.  Being: ? Pregnant. ? In the period of time after your baby is delivered (postpartum). ? Older than age 60. ? Overweight. ? A smoker, especially if you have other risks. What are the signs or symptoms? Symptoms of this condition usually start suddenly and include:  Shortness of breath during activity or at rest.  Coughing, coughing up blood, or coughing up blood-tinged mucus.  Chest pain that is often worse with deep breaths.  Rapid or irregular heartbeat.  Feeling light-headed or dizzy.  Fainting.  Feeling anxious.  Fever.  Sweating.  Pain and swelling in a leg. This is a symptom of DVT, which can lead to PE. How is this diagnosed? This condition may be diagnosed based on:  Your medical  history.  A physical exam.  Blood tests.  CT pulmonary angiogram. This test checks blood flow in and around your lungs.  Ventilation-perfusion scan, also called a lung VQ scan. This test measures air flow and blood flow to the lungs.  An ultrasound of the legs. How is this treated? Treatment for this condition depends on many factors, such as the cause of your PE, your risk for bleeding or developing more clots, and other medical conditions you have. Treatment aims to remove, dissolve, or stop blood clots from forming or growing larger. Treatment may include:  Medicines, such as: ? Blood thinning medicines (anticoagulants) to stop clots from forming. ? Medicines that dissolve clots (thrombolytics).  Procedures, such as: ? Using a flexible tube to remove a blood clot (embolectomy) or to deliver medicine to destroy it (catheter-directed thrombolysis). ? Inserting a filter into a large vein that carries blood to the heart (inferior vena cava). This filter (vena cava filter) catches blood clots before they reach the lungs. ? Surgery to remove the clot (surgical embolectomy). This is rare. You may need a combination of immediate, long-term (up to 3 months after diagnosis), and extended (more than 3 months after diagnosis) treatments. Your treatment may continue for several months (maintenance therapy). You and your health care provider will work together to choose the treatment program that is best for you. Follow these instructions at home: Medicines  Take over-the-counter and prescription medicines only as told by your health care provider.  If you   are taking an anticoagulant medicine: ? Take the medicine every day at the same time each day. ? Understand what foods and drugs interact with your medicine. ? Understand the side effects of this medicine, including excessive bruising or bleeding. Ask your health care provider or pharmacist about other side effects. General  instructions  Wear a medical alert bracelet or carry a medical alert card that says you have had a PE and lists what medicines you take.  Ask your health care provider when you may return to your normal activities. Avoid sitting or lying for a long time without moving.  Maintain a healthy weight. Ask your health care provider what weight is healthy for you.  Do not use any products that contain nicotine or tobacco, such as cigarettes, e-cigarettes, and chewing tobacco. If you need help quitting, ask your health care provider.  Talk with your health care provider about any travel plans. It is important to make sure that you are still able to take your medicine while on trips.  Keep all follow-up visits as told by your health care provider. This is important. Contact a health care provider if:  You missed a dose of your blood thinner medicine. Get help right away if:  You have: ? New or increased pain, swelling, warmth, or redness in an arm or leg. ? Numbness or tingling in an arm or leg. ? Shortness of breath during activity or at rest. ? A fever. ? Chest pain. ? A rapid or irregular heartbeat. ? A severe headache. ? Vision changes. ? A serious fall or accident, or you hit your head. ? Stomach (abdominal) pain. ? Blood in your vomit, stool, or urine. ? A cut that will not stop bleeding.  You cough up blood.  You feel light-headed or dizzy.  You cannot move your arms or legs.  You are confused or have memory loss. These symptoms may represent a serious problem that is an emergency. Do not wait to see if the symptoms will go away. Get medical help right away. Call your local emergency services (911 in the [place] Do not drive yourself to the hospital. Summary  A pulmonary embolism (PE) is a sudden blockage or decrease of blood flow in one or both lungs. PE is a dangerous and life-threatening condition that needs to be treated right away.  Treatments for this condition usually  include medicines to thin your blood (anticoagulants) or medicines to break apart blood clots (thrombolytics).  If you are given blood thinners, it is important to take the medicine every day at the same time each day.  Understand what foods and drugs interact with any medicines that you are taking.  If you have signs of PE or DVT, call your local emergency services (911 in the [place] This information is not intended to replace advice given to you by your health care provider. Make sure you discuss any questions you have with your health care provider. Document Revised: 11/27/2017 Document Reviewed: 11/27/2017 [employer] Patient Education  2020 [employer]  

## 2019-12-09 NOTE — Plan of Care (Signed)

## 2019-12-09 NOTE — Discharge Summary (Signed)
Physician Discharge Summary  Tasha Wu ZOX:[id number] DOB: 09-19-1990 DOA: 12/04/2019  PCP: Janeece Agee, NP  Admit date: 12/04/2019 Discharge date: 12/09/2019  Admitted From: Home Disposition:  Home  Recommendations for Outpatient Follow-up:  1. Follow up with PCP in 1 week 2. Follow up with OB as scheduled 3. Follow up with Dr. Delton Coombes, Pulmonology, in 1-2 weeks   Discharge Condition: Stable CODE STATUS: Full  Diet recommendation:  Diet Orders (From admission, onward)    Start     Ordered   12/06/19 1034  Diet regular Room service appropriate? Yes; Fluid consistency: Thin  Diet effective now       Question Answer Comment  Room service appropriate? Yes   Fluid consistency: Thin      12/06/19 1034         Brief/Interim Summary: Tasha Wu is a 29 y.o. female with medical history significant of G5 P3-0-1-3, currently [redacted] weeks pregnant self-reported is to see OB/GYN October 6 for first visit, presented to the ED with sudden onset pleuritic left-sided chest pain, shortness of breath, syncopal episode lasting 3 to 5 minutes which occurred around 11 AM on the morning of admission.  Patient stated was making breakfast for her children when she suddenly had left-sided pleuritic chest pain, shortness of breath, dizziness.  Patient tried to walk over to her fianc for help however passed out hitting her jaw on the bed frame.  Patient subsequently regained consciousness and subsequently brought to the ED.  Patient with complaints of ongoing left-sided pleuritic chest pain, shortness of breath.  Patient denies any fevers, no chills, no abdominal pain, no diarrhea, no constipation, no melena, no hematemesis, no hematochezia.  No focal neurological deficits.  No bleeding noted.  Patient does endorse some nausea and emesis.  Stated unable to tolerate prenatal vitamins due to nausea and emesis.  Patient denies any recent surgeries, no recent long travel, no prior history of DVT or PE.  No  family history of DVTs or PEs.  ED Course: Patient seen in the ED and on presentation noted to be tachypneic, tachycardic, borderline blood pressure with systolics in the mid 90s to low 100s.  Critical care was consulted who recommended admission to stepdown unit, close monitoring, Lovenox.  Lovenox ordered the ED.  2D echo lower extremity Dopplers ordered    Patient was started on IV heparin, noted to have episode of hypotension with elevated cardiac enzymes.  Patient underwent thrombolytics per IR due to hemodynamic instability and right ventricular strain with clinical improvement.  She was eventually transitioned to Lovenox.  PCCM was recommending patient to continue Lovenox for management of PE through pregnancy up until 6 weeks postpartum.  Discharge Diagnoses:  Principal Problem:   Bilateral pulmonary embolism (HCC) Active Problems:   Anemia   Anxiety   Gastroesophageal reflux disease   Pregnant   High-risk pregnancy in first trimester   Hypokalemia   Discharge Instructions  Discharge Instructions    Call MD for:  difficulty breathing, headache or visual disturbances   Complete by: As directed    Call MD for:  extreme fatigue   Complete by: As directed    Call MD for:  persistant dizziness or light-headedness   Complete by: As directed    Call MD for:  persistant nausea and vomiting   Complete by: As directed    Call MD for:  severe uncontrolled pain   Complete by: As directed    Call MD for:  temperature >100.4   Complete by: As  directed    Discharge instructions   Complete by: As directed    You were cared for by a hospitalist during your hospital stay. If you have any questions about your discharge medications or the care you received while you were in the hospital after you are discharged, you can call the unit and ask to speak with the hospitalist on call if the hospitalist that took care of you is not available. Once you are discharged, your primary care physician  will handle any further medical issues. Please note that NO REFILLS for any discharge medications will be authorized once you are discharged, as it is imperative that you return to your primary care physician (or establish a relationship with a primary care physician if you do not have one) for your aftercare needs so that they can reassess your need for medications and monitor your lab values.   Increase activity slowly   Complete by: As directed    No wound care   Complete by: As directed      Allergies as of 12/09/2019   No Known Allergies     Medication List    TAKE these medications   albuterol 108 (90 Base) MCG/ACT inhaler Commonly known as: VENTOLIN HFA Inhale 2 puffs into the lungs every 6 (six) hours as needed for wheezing or shortness of breath.   cetirizine 10 MG tablet Commonly known as: ZYRTEC Take 1 tablet (10 mg total) by mouth daily.   Doxylamine-Pyridoxine 10-10 MG Tbec Take 10 mg by mouth at bedtime.   enoxaparin 80 MG/0.8ML injection Commonly known as: LOVENOX Inject 0.7 mLs (70 mg total) into the skin every 12 (twelve) hours.   multivitamin-prenatal 27-0.8 MG Tabs tablet Take 1 tablet by mouth daily at 12 noon.       Follow-up Information    Janeece Agee, NP. Schedule an appointment as soon as possible for a visit in 1 week(s).   Specialty: Adult Health Nurse Practitioner Contact information: [address] [place] [address] [phone]        Leslye Peer, MD. Schedule an appointment as soon as possible for a visit.   Specialty: Pulmonary Disease Contact information: [address] [place] [address] [phone]        Ob/Gyn, [facility] [place] Follow up.   Specialty: Obstetrics and Gynecology Contact information: [address] [place] [address] [phone]              No Known Allergies  Consultations:  PCCM  IR   Procedures/Studies: DG Chest 2 View  Result Date:  12/04/2019 CLINICAL DATA:  Chest pain. EXAM: CHEST - 2 VIEW COMPARISON:  10/02/2012 FINDINGS: The heart size and mediastinal contours are within normal limits. Both lungs are clear. No evidence of pneumothorax or pleural effusion. The visualized skeletal structures are unremarkable. IMPRESSION: Normal study. Electronically Signed   By: Danae Orleans M.D.   On: 12/04/2019 13:30   CT Angio Chest PE W and/or Wo Contrast  Result Date: 12/04/2019 CLINICAL DATA:  Patient is pregnant. Chest pain, pressure, no shortness of breath or cough. EXAM: CT ANGIOGRAPHY CHEST WITH CONTRAST TECHNIQUE: Multidetector CT imaging of the chest was performed using the standard protocol during bolus administration of intravenous contrast. Multiplanar CT image reconstructions and MIPs were obtained to evaluate the vascular anatomy. CONTRAST:  OMNIPAQUE IOHEXOL 350 MG/ML SOLN COMPARISON:  CT chest 12/08/2017 FINDINGS: Cardiovascular: Satisfactory opacification of the pulmonary arteries to the segmental level. There is extensive bilateral  nonocclusive and occlusive thrombus involving the right upper, right middle and right lower lobes as well as the left lingula and left lower lobe. Thrombus extends slightly into the left main pulmonary artery. The RV to LV ratio is 1.4 with evidence of right heart strain. Normal caliber of the main pulmonary artery and thoracic aorta. Mediastinum/Nodes: No enlarged mediastinal, hilar, or axillary lymph nodes. Thyroid gland, trachea, and esophagus demonstrate no significant findings. Lungs/Pleura: Lungs are clear. No pleural effusion or pneumothorax. Upper Abdomen: No acute abnormality. Musculoskeletal: No chest wall abnormality. No acute or significant osseous findings. Review of the MIP images confirms the above findings. IMPRESSION: Extensive bilateral nonocclusive and occlusive thrombus, as detailed above. Positive for acute PE with CT evidence of right heart strain (RV/LV Ratio = 1.4) consistent  with at least submassive (intermediate risk) PE. The presence of right heart strain has been associated with an increased risk of morbidity and mortality. These results were called by telephone at the time of interpretation on 12/04/2019 at 4:35 pm to provider [facility] , who verbally acknowledged these results. Electronically Signed   By: Emmaline Kluver M.D.   On: 12/04/2019 16:36   US OB Comp Less 14 Wks  Result Date: 12/04/2019 CLINICAL DATA:  Pregnancy, ICU patient EXAM: OBSTETRIC <14 WK ULTRASOUND TECHNIQUE: Transabdominal ultrasound was performed for evaluation of the gestation as well as the maternal uterus and adnexal regions. COMPARISON:  Obstetrical ultrasound 08/23/2019 FINDINGS: Intrauterine gestational sac: Single Yolk sac:  Visualized. Embryo:  Visualized. Cardiac Activity: Visualized. Heart Rate: 188 bpm CRL:   31.8 mm   10 w 0 d                  [place] EDC: 07/01/2020 Subchorionic hemorrhage: Small volume mixed hypoechoic to anechoic subchorionic hemorrhage is seen. Maternal uterus/adnexae: Normal anteverted uterus. No other focal uterine abnormalities. No concerning adnexal lesions. No free fluid in the pelvis. IMPRESSION: Single intrauterine gestation with a gestational age of [redacted] weeks, 0 days by crown-rump length sonographic estimation. Small volume subchorionic hemorrhage as well as borderline fetal tachycardia. Recommend correlation with clinical findings and obstetrical consultation if not previously obtained. Electronically Signed   By: Kreg Shropshire M.D.   On: 12/04/2019 22:00   IR Angiogram Selective Each Additional Vessel  Result Date: 12/05/2019 INDICATION: 29 year old female with 9 week pregnancy and submassive pulmonary embolism. She presents for thrombo lysis EXAM: ULTRASOUND-GUIDED ACCESS LEFT COMMON FEMORAL VEIN X2 PULMONARY ARTERY ANGIOGRAM PLACEMENT OF RIGHT AND LEFT LYTIC CATHETERS FOR INITIATION OF TPA THROMBOLYSIS COMPARISON:  CT 12/04/2019 MEDICATIONS: None  ANESTHESIA/SEDATION: Versed 0.5 mg IV; Fentanyl 25 mcg IV Moderate Sedation Time:  30 minutes The patient was continuously monitored during the procedure by the interventional radiology nurse under my direct supervision. FLUOROSCOPY TIME:  Fluoroscopy Time: 9 minutes 18 seconds (67 mGy). COMPLICATIONS: None TECHNIQUE: Informed consent was obtained from the patient and the patient's family following explanation of the procedure, risks, benefits and alternatives. Specific risks include bleeding, infection, contrast reaction, kidney injury, venous injury, life-threatening hemorrhage including brain hemorrhage, gastrointestinal hemorrhage, epistaxis, need for further surgery, need for further procedure, cardiopulmonary collapse, death. The patient understands, agrees and consents for the procedure. All questions were addressed. Patient is position supine position on the fluoroscopy table. Maximal barrier sterile technique utilized including caps, mask, sterile gowns, sterile gloves, large sterile drape, hand hygiene, and betadine prep. 1% lidocaine used for local anesthesia. Moderate sedation was provided. Radiologic shielding of the patient's abdomen/pelvis also performed. Ultrasound survey of the  left inguinal region was performed with images stored and sent to PACs. A single wall needle was used access the left common femoral vein under ultrasound. With venous blood flow returned, an 035 wire was passed through the needle into the iliac vein. A 7 French sheath was placed over the wire. The dilator was removed and the sheath was flushed. Subsequently, a single wall needle was used access the left common femoral vein adjacent to the initial puncture. With venous blood flow returned, a 7 [place] vascular sheath was placed over the wire. The dilator was removed and the sheath was flushed. Combination of a short Kumpe the catheter and a Bentson wire was then used to navigate through the left iliac vein and advanced to the  right atrium. Angled pigtail catheter was then used in attempt to navigate into the pulmonary outflow. We then exchanged the pigtail catheter for a Bern catheter 100 cm. This was used to navigate a Glidewire into the main pulmonary artery. Once the Bern catheter was within the pulmonary artery, pressure measurement was performed. 34/14 (25). Glidewire was then advanced into the left-sided pulmonary veins, with selection of lower lobe pulmonary vein. Bern catheter was removed and a 15 cm infusion length, 90 cm working length UniFuse catheter was placed. Small contrast infusion confirmed position. Catheter was flushed. Combination of a short Kumpe the and the Bentson wire were then advanced through the alternative sheath. Exchange of the Kumpe the catheter was performed for the angled pigtail catheter. Again angled pigtail catheter was briefly used to achieve position into the pulmonary artery outflow tract. The pigtail catheter was removed and exchanged for a 100 cm Bern catheter. Once the Bern catheter was in place the Glidewire and the burn were used to select the pulmonary artery of flow, placing the wire into the right main pulmonary artery. Once the catheter and wire were positioned into the lower lobar branches, catheter was removed on the Glidewire. A 15 cm infusion length UniFuse 90 cm working length catheter then placed on the glide wire. Wire was removed, small amount of contrast confirmed position and catheter was flushed. The obturator wires were then placed through both the left and right catheters. Sheaths were secured in position.  Final image was stored. The patient tolerated the procedure well and remained hemodynamically stable throughout. No complications were encountered and no significant blood loss was encountered. FINDINGS: Ultrasound survey demonstrates patent left common femoral vein. Main pulmonary artery pressure measures 34/14 (25). The leftward 7 French (red) sheath transmits the  left-sided catheter. The right ward 7 French sheath transmits the right-sided pulmonary artery catheter. IMPRESSION: Status post placement of left and right pulmonary artery infusion catheters for initiation of catheter directed thrombolysis. Signed, Yvone Neu. Loreta Ave DO [facility] Radiology PLAN: Patient will be ICU status overnight. Bed rest. Both the right and left catheter will receive 1 mg tPA per hour for 12 hours, for a total dose of 24 mg over 12 hours. Plan for repeat trans duction of catheter pressures after treatment, reassessment, and probable removal of catheters. EVery 6 our blood draw, including CBC, heparin level, and fibrinogen. Electronically Signed   By: Gilmer Mor D.O.   On: 12/05/2019 15:29   IR Angiogram Selective Each Additional Vessel  Result Date: 12/05/2019 INDICATION: 29 year old female with 9 week pregnancy and submassive pulmonary embolism. She presents for thrombo lysis EXAM: ULTRASOUND-GUIDED ACCESS LEFT COMMON FEMORAL VEIN X2 PULMONARY ARTERY ANGIOGRAM PLACEMENT OF RIGHT AND LEFT LYTIC CATHETERS FOR  INITIATION OF TPA THROMBOLYSIS COMPARISON:  CT 12/04/2019 MEDICATIONS: None ANESTHESIA/SEDATION: Versed 0.5 mg IV; Fentanyl 25 mcg IV Moderate Sedation Time:  30 minutes The patient was continuously monitored during the procedure by the interventional radiology nurse under my direct supervision. FLUOROSCOPY TIME:  Fluoroscopy Time: 9 minutes 18 seconds (67 mGy). COMPLICATIONS: None TECHNIQUE: Informed consent was obtained from the patient and the patient's family following explanation of the procedure, risks, benefits and alternatives. Specific risks include bleeding, infection, contrast reaction, kidney injury, venous injury, life-threatening hemorrhage including brain hemorrhage, gastrointestinal hemorrhage, epistaxis, need for further surgery, need for further procedure, cardiopulmonary collapse, death. The patient understands,  agrees and consents for the procedure. All questions were addressed. Patient is position supine position on the fluoroscopy table. Maximal barrier sterile technique utilized including caps, mask, sterile gowns, sterile gloves, large sterile drape, hand hygiene, and betadine prep. 1% lidocaine used for local anesthesia. Moderate sedation was provided. Radiologic shielding of the patient's abdomen/pelvis also performed. Ultrasound survey of the left inguinal region was performed with images stored and sent to PACs. A single wall needle was used access the left common femoral vein under ultrasound. With venous blood flow returned, an 035 wire was passed through the needle into the iliac vein. A 7 French sheath was placed over the wire. The dilator was removed and the sheath was flushed. Subsequently, a single wall needle was used access the left common femoral vein adjacent to the initial puncture. With venous blood flow returned, a 7 [place] vascular sheath was placed over the wire. The dilator was removed and the sheath was flushed. Combination of a short Kumpe the catheter and a Bentson wire was then used to navigate through the left iliac vein and advanced to the right atrium. Angled pigtail catheter was then used in attempt to navigate into the pulmonary outflow. We then exchanged the pigtail catheter for a Bern catheter 100 cm. This was used to navigate a Glidewire into the main pulmonary artery. Once the Bern catheter was within the pulmonary artery, pressure measurement was performed. 34/14 (25). Glidewire was then advanced into the left-sided pulmonary veins, with selection of lower lobe pulmonary vein. Bern catheter was removed and a 15 cm infusion length, 90 cm working length UniFuse catheter was placed. Small contrast infusion confirmed position. Catheter was flushed. Combination of a short Kumpe the and the Bentson wire were then advanced through the alternative sheath. Exchange of the Kumpe the catheter  was performed for the angled pigtail catheter. Again angled pigtail catheter was briefly used to achieve position into the pulmonary artery outflow tract. The pigtail catheter was removed and exchanged for a 100 cm Bern catheter. Once the Bern catheter was in place the Glidewire and the burn were used to select the pulmonary artery of flow, placing the wire into the right main pulmonary artery. Once the catheter and wire were positioned into the lower lobar branches, catheter was removed on the Glidewire. A 15 cm infusion length UniFuse 90 cm working length catheter then placed on the glide wire. Wire was removed, small amount of contrast confirmed position and catheter was flushed. The obturator wires were then placed through both the left and right catheters. Sheaths were secured in position.  Final image was stored. The patient tolerated the procedure well and remained hemodynamically stable throughout. No complications were encountered and no significant blood loss was encountered. FINDINGS: Ultrasound survey demonstrates patent left common femoral vein. Main pulmonary artery pressure measures 34/14 (25). The leftward 7 French (red)  sheath transmits the left-sided catheter. The right ward 7 French sheath transmits the right-sided pulmonary artery catheter. IMPRESSION: Status post placement of left and right pulmonary artery infusion catheters for initiation of catheter directed thrombolysis. Signed, Yvone Neu. Loreta Ave DO [facility] Radiology PLAN: Patient will be ICU status overnight. Bed rest. Both the right and left catheter will receive 1 mg tPA per hour for 12 hours, for a total dose of 24 mg over 12 hours. Plan for repeat trans duction of catheter pressures after treatment, reassessment, and probable removal of catheters. EVery 6 our blood draw, including CBC, heparin level, and fibrinogen. Electronically Signed   By: Gilmer Mor D.O.   On: 12/05/2019 15:29    IR US Guide Vasc Access Left  Result Date: 12/05/2019 INDICATION: 29 year old female with 9 week pregnancy and submassive pulmonary embolism. She presents for thrombo lysis EXAM: ULTRASOUND-GUIDED ACCESS LEFT COMMON FEMORAL VEIN X2 PULMONARY ARTERY ANGIOGRAM PLACEMENT OF RIGHT AND LEFT LYTIC CATHETERS FOR INITIATION OF TPA THROMBOLYSIS COMPARISON:  CT 12/04/2019 MEDICATIONS: None ANESTHESIA/SEDATION: Versed 0.5 mg IV; Fentanyl 25 mcg IV Moderate Sedation Time:  30 minutes The patient was continuously monitored during the procedure by the interventional radiology nurse under my direct supervision. FLUOROSCOPY TIME:  Fluoroscopy Time: 9 minutes 18 seconds (67 mGy). COMPLICATIONS: None TECHNIQUE: Informed consent was obtained from the patient and the patient's family following explanation of the procedure, risks, benefits and alternatives. Specific risks include bleeding, infection, contrast reaction, kidney injury, venous injury, life-threatening hemorrhage including brain hemorrhage, gastrointestinal hemorrhage, epistaxis, need for further surgery, need for further procedure, cardiopulmonary collapse, death. The patient understands, agrees and consents for the procedure. All questions were addressed. Patient is position supine position on the fluoroscopy table. Maximal barrier sterile technique utilized including caps, mask, sterile gowns, sterile gloves, large sterile drape, hand hygiene, and betadine prep. 1% lidocaine used for local anesthesia. Moderate sedation was provided. Radiologic shielding of the patient's abdomen/pelvis also performed. Ultrasound survey of the left inguinal region was performed with images stored and sent to PACs. A single wall needle was used access the left common femoral vein under ultrasound. With venous blood flow returned, an 035 wire was passed through the needle into the iliac vein. A 7 French sheath was placed over the wire. The dilator was removed and the sheath was  flushed. Subsequently, a single wall needle was used access the left common femoral vein adjacent to the initial puncture. With venous blood flow returned, a 7 [place] vascular sheath was placed over the wire. The dilator was removed and the sheath was flushed. Combination of a short Kumpe the catheter and a Bentson wire was then used to navigate through the left iliac vein and advanced to the right atrium. Angled pigtail catheter was then used in attempt to navigate into the pulmonary outflow. We then exchanged the pigtail catheter for a Bern catheter 100 cm. This was used to navigate a Glidewire into the main pulmonary artery. Once the Bern catheter was within the pulmonary artery, pressure measurement was performed. 34/14 (25). Glidewire was then advanced into the left-sided pulmonary veins, with selection of lower lobe pulmonary vein. Bern catheter was removed and a 15 cm infusion length, 90 cm working length UniFuse catheter was placed. Small contrast infusion confirmed position. Catheter was flushed. Combination of a short Kumpe the and the Bentson wire were then advanced through the alternative sheath. Exchange of the Kumpe the catheter was performed for the angled pigtail catheter. Again angled pigtail  catheter was briefly used to achieve position into the pulmonary artery outflow tract. The pigtail catheter was removed and exchanged for a 100 cm Bern catheter. Once the Bern catheter was in place the Glidewire and the burn were used to select the pulmonary artery of flow, placing the wire into the right main pulmonary artery. Once the catheter and wire were positioned into the lower lobar branches, catheter was removed on the Glidewire. A 15 cm infusion length UniFuse 90 cm working length catheter then placed on the glide wire. Wire was removed, small amount of contrast confirmed position and catheter was flushed. The obturator wires were then placed through both the left and right catheters. Sheaths were  secured in position.  Final image was stored. The patient tolerated the procedure well and remained hemodynamically stable throughout. No complications were encountered and no significant blood loss was encountered. FINDINGS: Ultrasound survey demonstrates patent left common femoral vein. Main pulmonary artery pressure measures 34/14 (25). The leftward 7 French (red) sheath transmits the left-sided catheter. The right ward 7 French sheath transmits the right-sided pulmonary artery catheter. IMPRESSION: Status post placement of left and right pulmonary artery infusion catheters for initiation of catheter directed thrombolysis. Signed, Yvone Neu. Loreta Ave DO [facility] Radiology PLAN: Patient will be ICU status overnight. Bed rest. Both the right and left catheter will receive 1 mg tPA per hour for 12 hours, for a total dose of 24 mg over 12 hours. Plan for repeat trans duction of catheter pressures after treatment, reassessment, and probable removal of catheters. EVery 6 our blood draw, including CBC, heparin level, and fibrinogen. Electronically Signed   By: Gilmer Mor D.O.   On: 12/05/2019 15:29   IR US Guide Vasc Access Left  Result Date: 12/05/2019 INDICATION: 29 year old female with 9 week pregnancy and submassive pulmonary embolism. She presents for thrombo lysis EXAM: ULTRASOUND-GUIDED ACCESS LEFT COMMON FEMORAL VEIN X2 PULMONARY ARTERY ANGIOGRAM PLACEMENT OF RIGHT AND LEFT LYTIC CATHETERS FOR INITIATION OF TPA THROMBOLYSIS COMPARISON:  CT 12/04/2019 MEDICATIONS: None ANESTHESIA/SEDATION: Versed 0.5 mg IV; Fentanyl 25 mcg IV Moderate Sedation Time:  30 minutes The patient was continuously monitored during the procedure by the interventional radiology nurse under my direct supervision. FLUOROSCOPY TIME:  Fluoroscopy Time: 9 minutes 18 seconds (67 mGy). COMPLICATIONS: None TECHNIQUE: Informed consent was obtained from the patient and the patient's family  following explanation of the procedure, risks, benefits and alternatives. Specific risks include bleeding, infection, contrast reaction, kidney injury, venous injury, life-threatening hemorrhage including brain hemorrhage, gastrointestinal hemorrhage, epistaxis, need for further surgery, need for further procedure, cardiopulmonary collapse, death. The patient understands, agrees and consents for the procedure. All questions were addressed. Patient is position supine position on the fluoroscopy table. Maximal barrier sterile technique utilized including caps, mask, sterile gowns, sterile gloves, large sterile drape, hand hygiene, and betadine prep. 1% lidocaine used for local anesthesia. Moderate sedation was provided. Radiologic shielding of the patient's abdomen/pelvis also performed. Ultrasound survey of the left inguinal region was performed with images stored and sent to PACs. A single wall needle was used access the left common femoral vein under ultrasound. With venous blood flow returned, an 035 wire was passed through the needle into the iliac vein. A 7 French sheath was placed over the wire. The dilator was removed and the sheath was flushed. Subsequently, a single wall needle was used access the left common femoral vein adjacent to the initial puncture. With venous blood flow returned, a 7 [place] vascular  sheath was placed over the wire. The dilator was removed and the sheath was flushed. Combination of a short Kumpe the catheter and a Bentson wire was then used to navigate through the left iliac vein and advanced to the right atrium. Angled pigtail catheter was then used in attempt to navigate into the pulmonary outflow. We then exchanged the pigtail catheter for a Bern catheter 100 cm. This was used to navigate a Glidewire into the main pulmonary artery. Once the Bern catheter was within the pulmonary artery, pressure measurement was performed. 34/14 (25). Glidewire was then advanced into the left-sided  pulmonary veins, with selection of lower lobe pulmonary vein. Bern catheter was removed and a 15 cm infusion length, 90 cm working length UniFuse catheter was placed. Small contrast infusion confirmed position. Catheter was flushed. Combination of a short Kumpe the and the Bentson wire were then advanced through the alternative sheath. Exchange of the Kumpe the catheter was performed for the angled pigtail catheter. Again angled pigtail catheter was briefly used to achieve position into the pulmonary artery outflow tract. The pigtail catheter was removed and exchanged for a 100 cm Bern catheter. Once the Bern catheter was in place the Glidewire and the burn were used to select the pulmonary artery of flow, placing the wire into the right main pulmonary artery. Once the catheter and wire were positioned into the lower lobar branches, catheter was removed on the Glidewire. A 15 cm infusion length UniFuse 90 cm working length catheter then placed on the glide wire. Wire was removed, small amount of contrast confirmed position and catheter was flushed. The obturator wires were then placed through both the left and right catheters. Sheaths were secured in position.  Final image was stored. The patient tolerated the procedure well and remained hemodynamically stable throughout. No complications were encountered and no significant blood loss was encountered. FINDINGS: Ultrasound survey demonstrates patent left common femoral vein. Main pulmonary artery pressure measures 34/14 (25). The leftward 7 French (red) sheath transmits the left-sided catheter. The right ward 7 French sheath transmits the right-sided pulmonary artery catheter. IMPRESSION: Status post placement of left and right pulmonary artery infusion catheters for initiation of catheter directed thrombolysis. Signed, Yvone Neu. Loreta Ave DO [facility] Radiology PLAN: Patient will be ICU status overnight. Bed rest. Both  the right and left catheter will receive 1 mg tPA per hour for 12 hours, for a total dose of 24 mg over 12 hours. Plan for repeat trans duction of catheter pressures after treatment, reassessment, and probable removal of catheters. EVery 6 our blood draw, including CBC, heparin level, and fibrinogen. Electronically Signed   By: Gilmer Mor D.O.   On: 12/05/2019 15:29   DG CHEST PORT 1 VIEW  Result Date: 12/05/2019 CLINICAL DATA:  Chest pain EXAM: PORTABLE CHEST 1 VIEW COMPARISON:  December 04, 2019 FINDINGS: The heart size and mediastinal contours are within normal limits. Both lungs are clear. The visualized skeletal structures are unremarkable. IMPRESSION: No active disease. Electronically Signed   By: Jonna Clark M.D.   On: 12/05/2019 20:27   ECHOCARDIOGRAM COMPLETE  Result Date: 12/05/2019    ECHOCARDIOGRAM REPORT   Patient Name:   [facility] Date of Exam: 12/05/2019 Medical Rec #:  [id number]        Height:       67.0 in Accession #:    [id number]       Weight:       138.0 lb Date of Birth:  Mar 28, 1990        BSA:          1.727 m Patient Age:    29 years         BP:           96/59 mmHg Patient Gender: F                HR:           84 bpm. Exam Location:  Inpatient Procedure: 2D Echo, Cardiac Doppler and Color Doppler Indications:    Pulmonary Embolus 415.19 / I26.99  History:        Patient has no prior history of Echocardiogram examinations.                 Risk Factors:Former Smoker. GERD. Pregnant.  Sonographer:    Renella Cunas RDCS Referring Phys: [id number] Rodolph Bong  Sonographer Comments: Image acquisition challenging due to patient body habitus. IMPRESSIONS  1. Left ventricular ejection fraction, by estimation, is 60 to 65%. The left ventricle has normal function. Left ventricular endocardial border not optimally defined to evaluate regional wall motion. Left ventricular diastolic parameters were normal. There is the interventricular septum is flattened in systole, consistent with right  ventricular pressure overload.  2. Right ventricular systolic function is severely reduced. The right ventricular size is mildly enlarged. There is normal pulmonary artery systolic pressure.  3. Moderate pericardial effusion. The pericardial effusion is surrounding the apex. There is no evidence of cardiac tamponade.  4. The mitral valve is normal in structure. No evidence of mitral valve regurgitation. No evidence of mitral stenosis.  5. The aortic valve was not well visualized. Aortic valve regurgitation is not visualized. No aortic stenosis is present.  6. The inferior vena cava is normal in size with <50% respiratory variability, suggesting right atrial pressure of 8 mmHg. FINDINGS  Left Ventricle: Left ventricular ejection fraction, by estimation, is 60 to 65%. The left ventricle has normal function. Left ventricular endocardial border not optimally defined to evaluate regional wall motion. The left ventricular internal cavity size was normal in size. There is no left ventricular hypertrophy. The interventricular septum is flattened in systole, consistent with right ventricular pressure overload. Left ventricular diastolic parameters were normal. Normal left ventricular filling pressure. Right Ventricle: The right ventricular size is mildly enlarged. No increase in right ventricular wall thickness. Right ventricular systolic function is severely reduced. There is normal pulmonary artery systolic pressure. The tricuspid regurgitant velocity is 2.34 m/s, and with an assumed right atrial pressure of 8 mmHg, the estimated right ventricular systolic pressure is 29.9 mmHg. Left Atrium: Left atrial size was normal in size. Right Atrium: Right atrial size was normal in size. Pericardium: A moderately sized pericardial effusion is present. The pericardial effusion is surrounding the apex. There is no evidence of cardiac tamponade. Mitral Valve: The mitral valve is normal in structure. No evidence of mitral valve  regurgitation. No evidence of mitral valve stenosis. Tricuspid Valve: The tricuspid valve is normal in structure. Tricuspid valve regurgitation is trivial. No evidence of tricuspid stenosis. Aortic Valve: The aortic valve was not well visualized. Aortic valve regurgitation is not visualized. No aortic stenosis is present. Pulmonic Valve: The pulmonic valve was normal in structure. Pulmonic valve regurgitation is not visualized. No evidence of pulmonic stenosis. Aorta: The aortic root is normal in size and structure. Venous: The inferior vena cava is normal in size with less than 50% respiratory variability, suggesting right atrial pressure of 8 mmHg.  IAS/Shunts: No atrial level shunt detected by color flow Doppler.  LEFT VENTRICLE PLAX 2D LVIDd:         3.20 cm  Diastology LVIDs:         2.50 cm  LV e' medial:    7.51 cm/s LV PW:         0.70 cm  LV E/e' medial:  6.3 LV IVS:        0.70 cm  LV e' lateral:   9.79 cm/s LVOT diam:     1.70 cm  LV E/e' lateral: 4.8 LV SV:         28 LV SV Index:   16 LVOT Area:     2.27 cm  RIGHT VENTRICLE RV S prime:     8.55 cm/s TAPSE (M-mode): 1.5 cm LEFT ATRIUM             Index      RIGHT ATRIUM           Index LA diam:        1.20 cm 0.69 cm/m RA Area:     10.90 cm LA Vol (A2C):   9.2 ml  5.34 ml/m RA Volume:   24.30 ml  14.07 ml/m LA Vol (A4C):   7.8 ml  4.51 ml/m LA Biplane Vol: 9.0 ml  5.20 ml/m  AORTIC VALVE LVOT Vmax:   73.20 cm/s LVOT Vmean:  51.900 cm/s LVOT VTI:    0.122 m  AORTA Ao Root diam: 2.30 cm MITRAL VALVE               TRICUSPID VALVE MV Area (PHT): 3.99 cm    TR Peak grad:   21.9 mmHg MV Decel Time: 190 msec    TR Vmax:        234.00 cm/s MV E velocity: 47.40 cm/s MV A velocity: 35.50 cm/s  SHUNTS MV E/A ratio:  1.34        Systemic VTI:  0.12 m                            Systemic Diam: 1.70 cm Armanda Magic MD Electronically signed by Armanda Magic MD Signature Date/Time: 12/05/2019/10:31:55 AM    Final    VAS [place] LOWER EXTREMITY VENOUS (DVT)  Result  Date: 12/06/2019  Lower Venous DVTStudy Indications: Pulmonary embolism.  Limitations: Line and bandages. Comparison Study: No prior study Performing Technologist: Gertie Fey MHA, RDMS, RVT, RDCS  Examination Guidelines: A complete evaluation includes B-mode imaging, spectral Doppler, color Doppler, and power Doppler as needed of all accessible portions of each vessel. Bilateral testing is considered an integral part of a complete examination. Limited examinations for reoccurring indications may be performed as noted. The reflux portion of the exam is performed with the patient in reverse Trendelenburg.  +---------+---------------+---------+-----------+----------+--------------+ RIGHT    CompressibilityPhasicitySpontaneityPropertiesThrombus Aging +---------+---------------+---------+-----------+----------+--------------+ FV Prox  Full                                                        +---------+---------------+---------+-----------+----------+--------------+ FV Mid   Full                                                        +---------+---------------+---------+-----------+----------+--------------+  FV DistalFull                                                        +---------+---------------+---------+-----------+----------+--------------+ PFV      Full                                                        +---------+---------------+---------+-----------+----------+--------------+ POP      Full           Yes      Yes                                 +---------+---------------+---------+-----------+----------+--------------+ PTV      Full                                                        +---------+---------------+---------+-----------+----------+--------------+ PERO     Full                                                        +---------+---------------+---------+-----------+----------+--------------+   Right Technical Findings: Not  visualized segments include CFV, SFJ due to femoral line and bandaging. Limited evaluation right PTV, peroneal veins.  +---------+---------------+---------+-----------+----------+--------------+ LEFT     CompressibilityPhasicitySpontaneityPropertiesThrombus Aging +---------+---------------+---------+-----------+----------+--------------+ CFV      Full           Yes      Yes                                 +---------+---------------+---------+-----------+----------+--------------+ SFJ      Full                                                        +---------+---------------+---------+-----------+----------+--------------+ FV Prox  Full                                                        +---------+---------------+---------+-----------+----------+--------------+ FV Mid   Full                                                        +---------+---------------+---------+-----------+----------+--------------+ FV DistalFull                                                        +---------+---------------+---------+-----------+----------+--------------+  PFV      Full                                                        +---------+---------------+---------+-----------+----------+--------------+ POP      Full           Yes      Yes                                 +---------+---------------+---------+-----------+----------+--------------+ PTV      Full                                                        +---------+---------------+---------+-----------+----------+--------------+ PERO     Full                                                        +---------+---------------+---------+-----------+----------+--------------+     Summary: RIGHT: - There is no evidence of deep vein thrombosis in the lower extremity. However, portions of this examination were limited- see technologist comments above.  - No cystic structure found in the popliteal fossa.   LEFT: - There is no evidence of deep vein thrombosis in the lower extremity.  - No cystic structure found in the popliteal fossa.  *See table(s) above for measurements and observations. Electronically signed by Coral Else MD on 12/06/2019 at 7:43:58 PM.    Final    IR INFUSION THROMBOL ARTERIAL INITIAL (MS)  Result Date: 12/05/2019 INDICATION: 29 year old female with 9 week pregnancy and submassive pulmonary embolism. She presents for thrombo lysis EXAM: ULTRASOUND-GUIDED ACCESS LEFT COMMON FEMORAL VEIN X2 PULMONARY ARTERY ANGIOGRAM PLACEMENT OF RIGHT AND LEFT LYTIC CATHETERS FOR INITIATION OF TPA THROMBOLYSIS COMPARISON:  CT 12/04/2019 MEDICATIONS: None ANESTHESIA/SEDATION: Versed 0.5 mg IV; Fentanyl 25 mcg IV Moderate Sedation Time:  30 minutes The patient was continuously monitored during the procedure by the interventional radiology nurse under my direct supervision. FLUOROSCOPY TIME:  Fluoroscopy Time: 9 minutes 18 seconds (67 mGy). COMPLICATIONS: None TECHNIQUE: Informed consent was obtained from the patient and the patient's family following explanation of the procedure, risks, benefits and alternatives. Specific risks include bleeding, infection, contrast reaction, kidney injury, venous injury, life-threatening hemorrhage including brain hemorrhage, gastrointestinal hemorrhage, epistaxis, need for further surgery, need for further procedure, cardiopulmonary collapse, death. The patient understands, agrees and consents for the procedure. All questions were addressed. Patient is position supine position on the fluoroscopy table. Maximal barrier sterile technique utilized including caps, mask, sterile gowns, sterile gloves, large sterile drape, hand hygiene, and betadine prep. 1% lidocaine used for local anesthesia. Moderate sedation was provided. Radiologic shielding of the patient's abdomen/pelvis also performed. Ultrasound survey of the left inguinal region was performed with images stored and sent to  PACs. A single wall needle was used access the left common femoral vein under ultrasound. With venous blood flow returned, an 035 wire was passed through the needle into the iliac vein. A 7 French sheath  was placed over the wire. The dilator was removed and the sheath was flushed. Subsequently, a single wall needle was used access the left common femoral vein adjacent to the initial puncture. With venous blood flow returned, a 7 [place] vascular sheath was placed over the wire. The dilator was removed and the sheath was flushed. Combination of a short Kumpe the catheter and a Bentson wire was then used to navigate through the left iliac vein and advanced to the right atrium. Angled pigtail catheter was then used in attempt to navigate into the pulmonary outflow. We then exchanged the pigtail catheter for a Bern catheter 100 cm. This was used to navigate a Glidewire into the main pulmonary artery. Once the Bern catheter was within the pulmonary artery, pressure measurement was performed. 34/14 (25). Glidewire was then advanced into the left-sided pulmonary veins, with selection of lower lobe pulmonary vein. Bern catheter was removed and a 15 cm infusion length, 90 cm working length UniFuse catheter was placed. Small contrast infusion confirmed position. Catheter was flushed. Combination of a short Kumpe the and the Bentson wire were then advanced through the alternative sheath. Exchange of the Kumpe the catheter was performed for the angled pigtail catheter. Again angled pigtail catheter was briefly used to achieve position into the pulmonary artery outflow tract. The pigtail catheter was removed and exchanged for a 100 cm Bern catheter. Once the Bern catheter was in place the Glidewire and the burn were used to select the pulmonary artery of flow, placing the wire into the right main pulmonary artery. Once the catheter and wire were positioned into the lower lobar branches, catheter was removed on the Glidewire. A 15  cm infusion length UniFuse 90 cm working length catheter then placed on the glide wire. Wire was removed, small amount of contrast confirmed position and catheter was flushed. The obturator wires were then placed through both the left and right catheters. Sheaths were secured in position.  Final image was stored. The patient tolerated the procedure well and remained hemodynamically stable throughout. No complications were encountered and no significant blood loss was encountered. FINDINGS: Ultrasound survey demonstrates patent left common femoral vein. Main pulmonary artery pressure measures 34/14 (25). The leftward 7 French (red) sheath transmits the left-sided catheter. The right ward 7 French sheath transmits the right-sided pulmonary artery catheter. IMPRESSION: Status post placement of left and right pulmonary artery infusion catheters for initiation of catheter directed thrombolysis. Signed, Yvone Neu. Loreta Ave DO [facility]iology Special[facility] Radiology PLAN: Patient will be ICU status overnight. Bed rest. Both the right and left catheter will receive 1 mg tPA per hour for 12 hours, for a total dose of 24 mg over 12 hours. Plan for repeat trans duction of catheter pressures after treatment, reassessment, and probable removal of catheters. EVery 6 our blood draw, including CBC, heparin level, and fibrinogen. Electronically Signed   By: Gilmer Mor D.O.   On: 12/05/2019 15:29   IR THROMB F/U EVAL ART/VEN FINAL DAY (MS)  Result Date: 12/06/2019 INDICATION: 29 year old female with sub massive pulmonary embolism, completion lysis EXAM: FOLLOW-UP PULMONARY ARTERIAL LYSIS COMPARISON:  Yesterday exam including initiation CT chest 12/04/2019 MEDICATIONS: None. ANESTHESIA/SEDATION: None FLUOROSCOPY TIME:  None COMPLICATIONS: None TECHNIQUE: Informed written consent was inferred from the patient after a thorough discussion of the procedural risks, benefits and alternatives. All questions  were addressed. Maximal Sterile Barrier Technique was utilized including caps, mask, sterile gowns, sterile gloves, sterile drape, hand hygiene and skin antiseptic. A timeout  was performed prior to the initiation of the procedure. Bedside trans duction of the catheter was performed by the interventional radiology team, with pullback of the pulmonary artery catheter and removal of the operator wire. With flow confirmed, pressure was recorded. After pressure was recorded all catheters wires and sheaths were removed. Manual pressure was used for hemostasis and dressing was applied. FINDINGS: 23/6 (13) Initial: 34/14 (25) IMPRESSION: Bedside trans duction of pulmonary artery catheters, and removal of catheters and sheaths. Signed, Yvone NeuJaime S. Reyne DumasWagner, DO, RPVI [facility] Electronically Signed   By: Gilmer MorJaime  Wagner D.O.   On: 12/06/2019 10:52       Discharge Exam: Vitals:   12/09/19 0213 12/09/19 0503  BP: 96/62 (!) 104/57  Pulse: 71 65  Resp: 16 16  Temp: 99.1 F (37.3 C) 98.3 F (36.8 C)  SpO2: 100% 100%    General: Pt is alert, awake, not in acute distress Cardiovascular: RRR, S1/S2 +, no edema Respiratory: CTA bilaterally, no wheezing, no rhonchi, no respiratory distress, no conversational dyspnea  Abdominal: Soft, NT, ND, bowel sounds + Extremities: no edema, no cyanosis Psych: Normal mood and affect, stable judgement and insight     The results of significant diagnostics from this hospitalization (including imaging, microbiology, ancillary and laboratory) are listed below for reference.     Microbiology: Recent Results (from the past 240 hour(s))  Respiratory Panel by RT PCR (Flu A&B, Covid) - Nasopharyngeal Swab     Status: None   Collection Time: 12/04/19  5:58 PM   Specimen: Nasopharyngeal Swab  Result Value Ref Range Status   SARS Coronavirus 2 by RT PCR NEGATIVE NEGATIVE Final    Comment: (NOTE) SARS-CoV-2 target nucleic  acids are NOT DETECTED.  The SARS-CoV-2 RNA is generally detectable in upper respiratoy specimens during the acute phase of infection. The lowest concentration of SARS-CoV-2 viral copies this assay can detect is 131 copies/mL. A negative result does not preclude SARS-Cov-2 infection and should not be used as the sole basis for treatment or other patient management decisions. A negative result may occur with  improper specimen collection/handling, submission of specimen other than nasopharyngeal swab, presence of viral mutation(s) within the areas targeted by this assay, and inadequate number of viral copies (<131 copies/mL). A negative result must be combined with clinical observations, patient history, and epidemiological information. The expected result is Negative.  Fact Sheet for Patients:  https://www.moore.com/https://www.fda.gov/media/142436/download  Fact Sheet for Healthcare Providers:  https://www.young.biz/https://www.fda.gov/media/142435/download  This test is no t yet approved or cleared by the [place] FDA and  has been authorized for detection and/or diagnosis of SARS-CoV-2 by FDA under an Emergency Use Authorization (EUA). This EUA will remain  in effect (meaning this test can be used) for the duration of the COVID-19 declaration under Section 564(b)(1) of the Act, 21 U.S.C. section 360bbb-3(b)(1), unless the authorization is terminated or revoked sooner.     Influenza A by PCR NEGATIVE NEGATIVE Final   Influenza B by PCR NEGATIVE NEGATIVE Final    Comment: (NOTE) The Xpert Xpress SARS-CoV-2/FLU/RSV assay is intended as an aid in  the diagnosis of influenza from Nasopharyngeal swab specimens and  should not be used as a sole basis for treatment. Nasal washings and  aspirates are unacceptable for Xpert Xpress SARS-CoV-2/FLU/RSV  testing.  Fact Sheet for Patients: https://www.moore.com/https://www.fda.gov/media/142436/download  Fact Sheet for Healthcare Providers: https://www.young.biz/https://www.fda.gov/media/142435/download  This test  is not yet approved or cleared by the [place] FDA and  has been authorized  for detection and/or diagnosis of SARS-CoV-2 by  FDA under an Emergency Use Authorization (EUA). This EUA will remain  in effect (meaning this test can be used) for the duration of the  Covid-19 declaration under Section 564(b)(1) of the Act, 21  U.S.C. section 360bbb-3(b)(1), unless the authorization is  terminated or revoked. Performed at [facility], [address] [place], [place] [address]   MRSA PCR Screening     Status: None   Collection Time: 12/04/19  7:12 PM   Specimen: Nasal Mucosa; Nasopharyngeal  Result Value Ref Range Status   MRSA by PCR NEGATIVE NEGATIVE Final    Comment:        The GeneXpert MRSA Assay (FDA approved for NASAL specimens only), is one component of a comprehensive MRSA colonization surveillance program. It is not intended to diagnose MRSA infection nor to guide or monitor treatment for MRSA infections. Performed at [facility], [address], [place], [place] [address]   Urine culture     Status: Abnormal   Collection Time: 12/05/19  2:55 AM   Specimen: Urine, Clean Catch  Result Value Ref Range Status   Specimen Description   Final    URINE, CLEAN CATCH Performed at [facility], [address], [place], [place] [address]    Special Requests   Final    NONE Performed at [facility], [address] [place], [place] [address]    Culture (A)  Final    >=100,000 COLONIES/mL MULTIPLE SPECIES PRESENT, SUGGEST RECOLLECTION   Report Status 12/06/2019 FINAL  Final     Labs: BNP (last 3 results) No results for input(s): BNP in the last 8760 hours. Basic Metabolic Panel: Recent Labs  Lab 12/05/19 1920 12/05/19 1920 12/05/19 1936 12/05/19 1949 12/06/19 0459 12/07/19 0412 12/07/19 0800 12/08/19 0312 12/09/19 0416  NA 136   < > 136  --  135  --  135 136 134*  K 3.5   < > 3.4*  --   3.7  --  3.7 3.6 3.8  CL 109  --   --   --  111  --  109 105 104  CO2 21*  --   --   --  19*  --  20* 20* 22  GLUCOSE 96  --   --   --  83  --  81 82 81  BUN 7  --   --   --  6  --  <5* 6 9  CREATININE 0.76  --   --   --  0.69  --  0.63 0.63 0.67  CALCIUM 8.4*  --   --   --  7.6*  --  7.7* 8.2* 8.7*  MG  --   --   --  2.2 1.7 1.9  --  1.9 1.7  PHOS  --   --   --   --  2.9  --   --   --   --    < > = values in this interval not displayed.   Liver Function Tests: Recent Labs  Lab 12/05/19 0042  AST 21  ALT 12  ALKPHOS 38  BILITOT 0.7  PROT 6.4*  ALBUMIN 3.5   No results for input(s): LIPASE, AMYLASE in the last 168 hours. No results for input(s): AMMONIA in the last 168 hours. CBC: Recent Labs  Lab 12/06/19 0115 12/06/19 0115 12/06/19 1320 12/07/19 0412 12/07/19 1414 12/08/19 0312 12/09/19 0416  WBC 7.0  --  6.7 5.4  --  5.4 6.1  HGB 8.6*   < > 8.6* 7.9* 8.3* 8.3* 8.9*  HCT 25.3*   < > 24.7* 22.2* 23.8* 24.1* 25.8*  MCV 80.1  --  78.9* 79.0*  --  79.8* 79.1*  PLT 145*  --  147* 146*  --  155 183   < > = values in this interval not displayed.   Cardiac Enzymes: No results for input(s): CKTOTAL, CKMB, CKMBINDEX, TROPONINI in the last 168 hours. BNP: Invalid input(s): POCBNP CBG: Recent Labs  Lab 12/07/19 0820 12/08/19 0824 12/09/19 0732  GLUCAP 79 97 83   D-Dimer No results for input(s): DDIMER in the last 72 hours. Hgb A1c No results for input(s): HGBA1C in the last 72 hours. Lipid Profile No results for input(s): CHOL, HDL, LDLCALC, TRIG, CHOLHDL, LDLDIRECT in the last 72 hours. Thyroid function studies No results for input(s): TSH, T4TOTAL, T3FREE, THYROIDAB in the last 72 hours.  Invalid input(s): FREET3 Anemia work up No results for input(s): VITAMINB12, FOLATE, FERRITIN, TIBC, IRON, RETICCTPCT in the last 72 hours. Urinalysis    Component Value Date/Time   COLORURINE YELLOW 12/05/2019 0255   APPEARANCEUR HAZY (A) 12/05/2019 0255   LABSPEC  >1.046 (H) 12/05/2019 0255   PHURINE 5.0 12/05/2019 0255   GLUCOSEU NEGATIVE 12/05/2019 0255   HGBUR SMALL (A) 12/05/2019 0255   BILIRUBINUR NEGATIVE 12/05/2019 0255   BILIRUBINUR negative 09/24/2018 1207   BILIRUBINUR negative 11/22/2011 1142   KETONESUR 80 (A) 12/05/2019 0255   PROTEINUR NEGATIVE 12/05/2019 0255   UROBILINOGEN 0.2 09/24/2018 1207   UROBILINOGEN 0.2 04/28/2013 1620   NITRITE NEGATIVE 12/05/2019 0255   LEUKOCYTESUR LARGE (A) 12/05/2019 0255   Sepsis Labs Invalid input(s): PROCALCITONIN,  WBC,  LACTICIDVEN Microbiology Recent Results (from the past 240 hour(s))  Respiratory Panel by RT PCR (Flu A&B, Covid) - Nasopharyngeal Swab     Status: None   Collection Time: 12/04/19  5:58 PM   Specimen: Nasopharyngeal Swab  Result Value Ref Range Status   SARS Coronavirus 2 by RT PCR NEGATIVE NEGATIVE Final    Comment: (NOTE) SARS-CoV-2 target nucleic acids are NOT DETECTED.  The SARS-CoV-2 RNA is generally detectable in upper respiratoy specimens during the acute phase of infection. The lowest concentration of SARS-CoV-2 viral copies this assay can detect is 131 copies/mL. A negative result does not preclude SARS-Cov-2 infection and should not be used as the sole basis for treatment or other patient management decisions. A negative result may occur with  improper specimen collection/handling, submission of specimen other than nasopharyngeal swab, presence of viral mutation(s) within the areas targeted by this assay, and inadequate number of viral copies (<131 copies/mL). A negative result must be combined with clinical observations, patient history, and epidemiological information. The expected result is Negative.  Fact Sheet for Patients:  https://www.moore.com/  Fact Sheet for Healthcare Providers:  https://www.young.biz/  This test is no t yet approved or cleared by the [place] FDA and  has been authorized for  detection and/or diagnosis of SARS-CoV-2 by FDA under an Emergency Use Authorization (EUA). This EUA will remain  in effect (meaning this test can be used) for the duration of the COVID-19 declaration under Section 564(b)(1) of the Act, 21 U.S.C. section 360bbb-3(b)(1), unless the authorization is terminated or revoked sooner.     Influenza A by PCR NEGATIVE NEGATIVE Final   Influenza B by PCR NEGATIVE NEGATIVE Final    Comment: (NOTE) The Xpert Xpress SARS-CoV-2/FLU/RSV assay is intended as an aid  in  the diagnosis of influenza from Nasopharyngeal swab specimens and  should not be used as a sole basis for treatment. Nasal washings and  aspirates are unacceptable for Xpert Xpress SARS-CoV-2/FLU/RSV  testing.  Fact Sheet for Patients: https://www.moore.com/  Fact Sheet for Healthcare Providers: https://www.young.biz/  This test is not yet approved or cleared by the [place] FDA and  has been authorized for detection and/or diagnosis of SARS-CoV-2 by  FDA under an Emergency Use Authorization (EUA). This EUA will remain  in effect (meaning this test can be used) for the duration of the  Covid-19 declaration under Section 564(b)(1) of the Act, 21  U.S.C. section 360bbb-3(b)(1), unless the authorization is  terminated or revoked. Performed at [facility], [address] [place], [place] [address]   MRSA PCR Screening     Status: None   Collection Time: 12/04/19  7:12 PM   Specimen: Nasal Mucosa; Nasopharyngeal  Result Value Ref Range Status   MRSA by PCR NEGATIVE NEGATIVE Final    Comment:        The GeneXpert MRSA Assay (FDA approved for NASAL specimens only), is one component of a comprehensive MRSA colonization surveillance program. It is not intended to diagnose MRSA infection nor to guide or monitor treatment for MRSA infections. Performed at [facility], [address],  [place], [place] [address]   Urine culture     Status: Abnormal   Collection Time: 12/05/19  2:55 AM   Specimen: Urine, Clean Catch  Result Value Ref Range Status   Specimen Description   Final    URINE, CLEAN CATCH Performed at [facility], [address], [place], [place] [address]    Special Requests   Final    NONE Performed at [facility], [address], [place], [place], [place] [address]    Culture (A)  Final    >=100,000 COLONIES/mL MULTIPLE SPECIES PRESENT, SUGGEST RECOLLECTION   Report Status 12/06/2019 FINAL  Final     Patient was seen and examined on the day of discharge and was found to be in stable condition. Time coordinating discharge: 35 minutes including assessment and coordination of care, as well as examination of the patient.   SIGNED:  Noralee Stain, DO [facility] 12/09/2019, 1:49 PM

## 2019-12-09 NOTE — Progress Notes (Signed)
Pt educated about giving lovenox injection.  How to change the amount in the syringe to the right dose. Air bubble. Wiping the skin with an alcohol swap.  Needle placement.  Pt did well and states she understands it well.

## 2019-12-15 ENCOUNTER — Encounter: Payer: Self-pay | Admitting: Registered Nurse

## 2019-12-15 ENCOUNTER — Ambulatory Visit (INDEPENDENT_AMBULATORY_CARE_PROVIDER_SITE_OTHER): Payer: POS | Admitting: Registered Nurse

## 2019-12-15 ENCOUNTER — Other Ambulatory Visit: Payer: Self-pay

## 2019-12-15 VITALS — BP 110/76 | HR 99 | Temp 98.3°F | Ht 67.0 in | Wt 137.8 lb

## 2019-12-15 DIAGNOSIS — I2699 Other pulmonary embolism without acute cor pulmonale: Secondary | ICD-10-CM

## 2019-12-15 LAB — POCT GLYCOSYLATED HEMOGLOBIN (HGB A1C): Hemoglobin A1C: 5.2 % (ref 4.0–5.6)

## 2019-12-15 NOTE — Patient Instructions (Signed)
° ° ° °  If you have lab work done today you will be contacted with your lab results within the next 2 weeks.  If you have not heard from us then please contact us. The fastest way to get your results is to register for My Chart. ° ° °IF you received an x-ray today, you will receive an invoice from [facility]. Please contact [facility] at [phone] with questions or concerns regarding your invoice.  ° °IF you received labwork today, you will receive an invoice from [facility]. Please contact [facility] at [phone] with questions or concerns regarding your invoice.  ° °Our billing staff will not be able to assist you with questions regarding bills from these companies. ° °You will be contacted with the lab results as soon as they are available. The fastest way to get your results is to activate your My Chart account. Instructions are located on the last page of this paperwork. If you have not heard from us regarding the results in 2 weeks, please contact this office. °  ° ° ° °

## 2019-12-16 ENCOUNTER — Ambulatory Visit: Payer: Self-pay | Admitting: Registered Nurse

## 2019-12-16 LAB — CBC WITH DIFFERENTIAL
Basophils Absolute: 0.1 10*3/uL (ref 0.0–0.2)
Basos: 1 %
EOS (ABSOLUTE): 0.1 10*3/uL (ref 0.0–0.4)
Eos: 2 %
Hematocrit: 33.2 % — ABNORMAL LOW (ref 34.0–46.6)
Hemoglobin: 11.2 g/dL (ref 11.1–15.9)
Immature Grans (Abs): 0 10*3/uL (ref 0.0–0.1)
Immature Granulocytes: 1 %
Lymphocytes Absolute: 1.3 10*3/uL (ref 0.7–3.1)
Lymphs: 21 %
MCH: 27.5 pg (ref 26.6–33.0)
MCHC: 33.7 g/dL (ref 31.5–35.7)
MCV: 82 fL (ref 79–97)
Monocytes Absolute: 0.7 10*3/uL (ref 0.1–0.9)
Monocytes: 11 %
Neutrophils Absolute: 4.1 10*3/uL (ref 1.4–7.0)
Neutrophils: 64 %
RBC: 4.07 x10E6/uL (ref 3.77–5.28)
RDW: 14.1 % (ref 11.7–15.4)
WBC: 6.3 10*3/uL (ref 3.4–10.8)

## 2019-12-16 LAB — COMPREHENSIVE METABOLIC PANEL
ALT: 9 IU/L (ref 0–32)
AST: 16 IU/L (ref 0–40)
Albumin/Globulin Ratio: 1.7 (ref 1.2–2.2)
Albumin: 4.5 g/dL (ref 3.9–5.0)
Alkaline Phosphatase: 53 IU/L (ref 44–121)
BUN/Creatinine Ratio: 17 (ref 9–23)
BUN: 11 mg/dL (ref 6–20)
Bilirubin Total: 0.3 mg/dL (ref 0.0–1.2)
CO2: 18 mmol/L — ABNORMAL LOW (ref 20–29)
Calcium: 9.6 mg/dL (ref 8.7–10.2)
Chloride: 101 mmol/L (ref 96–106)
Creatinine, Ser: 0.66 mg/dL (ref 0.57–1.00)
GFR calc Af Amer: 137 mL/min/{1.73_m2} (ref >59)
GFR calc non Af Amer: 119 mL/min/{1.73_m2} (ref >59)
Globulin, Total: 2.7 g/dL (ref 1.5–4.5)
Glucose: 66 mg/dL (ref 65–99)
Potassium: 4.6 mmol/L (ref 3.5–5.2)
Sodium: 135 mmol/L (ref 134–144)
Total Protein: 7.2 g/dL (ref 6.0–8.5)

## 2019-12-16 LAB — IRON,TIBC AND FERRITIN PANEL
Ferritin: 44 ng/mL (ref 15–150)
Iron Saturation: 14 % — ABNORMAL LOW (ref 15–55)
Iron: 42 ug/dL (ref 27–159)
Total Iron Binding Capacity: 309 ug/dL (ref 250–450)
UIBC: 267 ug/dL (ref 131–425)

## 2019-12-16 LAB — PROTIME-INR
INR: 1 (ref 0.9–1.2)
Prothrombin Time: 10.4 s (ref 9.1–12.0)

## 2019-12-22 LAB — OB RESULTS CONSOLE ABO/RH: RH Type: POSITIVE

## 2019-12-22 LAB — OB RESULTS CONSOLE HIV ANTIBODY (ROUTINE TESTING): HIV: NONREACTIVE

## 2019-12-22 LAB — OB RESULTS CONSOLE GC/CHLAMYDIA
Chlamydia: NEGATIVE
Gonorrhea: NEGATIVE

## 2019-12-22 LAB — OB RESULTS CONSOLE ANTIBODY SCREEN: Antibody Screen: NEGATIVE

## 2019-12-22 LAB — OB RESULTS CONSOLE HEPATITIS B SURFACE ANTIGEN: Hepatitis B Surface Ag: NEGATIVE

## 2019-12-22 LAB — OB RESULTS CONSOLE RPR: RPR: NONREACTIVE

## 2019-12-22 LAB — OB RESULTS CONSOLE RUBELLA ANTIBODY, IGM: Rubella: IMMUNE

## 2019-12-23 DIAGNOSIS — Z3490 Encounter for supervision of normal pregnancy, unspecified, unspecified trimester: Secondary | ICD-10-CM | POA: Insufficient documentation

## 2019-12-24 ENCOUNTER — Ambulatory Visit (INDEPENDENT_AMBULATORY_CARE_PROVIDER_SITE_OTHER): Payer: 59 | Admitting: Internal Medicine

## 2019-12-24 ENCOUNTER — Other Ambulatory Visit: Payer: Self-pay

## 2019-12-24 ENCOUNTER — Encounter: Payer: Self-pay | Admitting: Internal Medicine

## 2019-12-24 VITALS — BP 96/60 | HR 101 | Temp 97.4°F | Ht 67.0 in | Wt 134.0 lb

## 2019-12-24 DIAGNOSIS — I2602 Saddle embolus of pulmonary artery with acute cor pulmonale: Secondary | ICD-10-CM | POA: Diagnosis not present

## 2019-12-24 NOTE — Patient Instructions (Addendum)
The patient should have follow up scheduled with myself in 3 months.   Prior to next visit patient should have: Echocardiogram.   Keep taking lovenox.

## 2019-12-24 NOTE — Progress Notes (Signed)
Tasha Wu    [id number]    08/26/1990  Primary Care Physician:Morrow, Tasha Burdock, NP Date of Appointment: 12/24/2019 Established Patient Visit  Chief complaint:   Chief Complaint  Patient presents with  . Consult    hx of bilat PE, hospitalized.  sob with exertion (doing things around home, also feel claustraphobic in shower, car) October 1st 2021     HPI: Tasha Wu  is a 29 y.o. woman with a history of G90P3A3. Currently 7th pregnancy about 12 weeks. No prior history of blood clots. hospitalied for Acute PE in the setting of pregnancy with  Catheter directed thrombolysis.  Interval Updates: Here for follow up. Tolerating lovenox injections. 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 She has still shortness of breath and has difficulty with chest heaviness. Still having pain from the sheath site in her groin although bruising is improving. Has a had time picking up her toddler due to dyspnea.  Follow[facility] with [facility] [place] [facility].     I have reviewed the patient's family social and past medical history and updated as appropriate.   Past Medical History:  Diagnosis Date  . Abnormal Pap smear 12/10/2011  . Anemia   . Anxiety 2010   anxiety attack;no meds;has not been dx'd  . Emotional instability (HCC)    Break down easily when things are not going her way  . GERD (gastroesophageal reflux disease)   . Hx of joint problems    Shoulders and knees have pain  . Infection    Yeast;would get frequently  . Infection    BV x 1  . Kidney infection 2010   PO antibxs  . Kidney stone complicating pregnancy   . Migraines    no rx'd meds in the past;goes to sleep  . PCOS (polycystic ovarian syndrome)   . Sickle cell trait (HCC)   . Sickle cell trait (HCC)   . Umbilical hernia 1991   Does not cause anyp problems  . Vaginal Pap smear, abnormal     Past Surgical History:  Procedure Laterality Date  . DILATION AND CURETTAGE OF UTERUS   2009   No complications  . IR ANGIOGRAM SELECTIVE EACH ADDITIONAL VESSEL  12/05/2019  . IR ANGIOGRAM SELECTIVE EACH ADDITIONAL VESSEL  12/05/2019  . IR INFUSION THROMBOL ARTERIAL INITIAL (MS)  12/05/2019  . IR THROMB F/U EVAL ART/VEN FINAL DAY (MS)  12/06/2019  . IR US GUIDE VASC ACCESS LEFT  12/05/2019  . IR US GUIDE VASC ACCESS LEFT  12/05/2019  . WISDOM TOOTH EXTRACTION      Family History  Problem Relation Age of Onset  . Sickle cell trait Mother   . Hypertension Mother   . Anemia Mother   . Sickle cell trait Brother   . Sickle cell anemia Maternal Aunt        Recently deceased from [facility] crisis  . Diabetes Maternal Aunt   . Sickle cell anemia Maternal Grandmother        Deceased  . Heart disease Maternal Aunt   . Asthma Sister        1/2 sibling;father's child  . Hyperthyroidism Paternal Grandmother   . Scoliosis Paternal Grandmother   . Arthritis Paternal Grandmother   . Migraines Father   . Other Father        Joint Problems;shoulders and knees  . Hernia Maternal Aunt        Umbilical  . Hypertension Maternal  Uncle   . Diabetes type I Cousin        maternal  . Diabetes Paternal Grandfather     Social History   Occupational History  . Not on file  Tobacco Use  . Smoking status: Former Smoker    Packs/day: 0.25    Years: 5.00    Pack years: 1.25    Types: Cigarettes    Quit date: 11/18/2011    Years since quitting: 8.1  . Smokeless tobacco: Never Used  Vaping Use  . Vaping Use: Never used  Substance and Sexual Activity  . Alcohol use: Not Currently    Comment: occ  . Drug use: No  . Sexual activity: Yes    Partners: Male    Birth control/protection: None     Physical Exam: Blood pressure 96/60, pulse (!) 101, temperature (!) 97.4 F (36.3 C), temperature source Temporal, height 5\' 7"  (1.702 m), weight 134 lb (60.8 kg), last menstrual period 09/24/2019, SpO2 99 %, unknown if currently breastfeeding.  Gen:      No acute distress Lungs:    No increased  respiratory effort, symmetric chest wall excursion, clear to auscultation bilaterally, no wheezes or crackles CV:         Tachycardic rate and  Regular rhythm; no murmurs, rubs, or gallops.  No pedal edema   Data Reviewed: Imaging: I have personally reviewed the patients CTPE from inpatient October 2021. Acute bilateral PE with right heart strain.  Echo reviewed - RV pressure overload.   Labs: Lab Results  Component Value Date   WBC 6.3 12/15/2019   HGB 11.2 12/15/2019   HCT 33.2 (L) 12/15/2019   MCV 82 12/15/2019   PLT 183 12/09/2019   Lab Results  Component Value Date   NA 135 12/15/2019   K 4.6 12/15/2019   CL 101 12/15/2019   CO2 18 (L) 12/15/2019    Immunization status: Immunization History  Administered Date(s) Administered  . Tdap 06/18/2012    Assessment:  Submassive Pulmonary Embolism s/p catheter directed thrombolytics Pregnancy, first trimester (due date April)  Plan/Recommendations: Tasha Wu is a 29 y.o. woman who presented with submassive acute PE in the setting of pregnancy. She had catheter directed thrombolysis and was started on lovenox.  She is not currently having any complications from bleeding.  She is having persistent dyspnea and some situational anxiety which is understandable given her recent health issues.  Given that she is still recovering from a significant health event and hospitalization and procedure, I think it is okay for her to return to work November 22th at a reduced schedule 4 hours a day for the first two weeks. Then ok to return to full schedule.   Continue lovenox through pregnancy, and 6 weeks after.  I will order an echocardiogram to be obtained 3 months following her initial study. She will see me back after that.    Return to Care: No follow-ups on file.   November 24, MD Pulmonary and Critical Care Medicine [facility] Office:[phone]

## 2019-12-28 NOTE — Telephone Encounter (Signed)
I wrote a letter under communications for her to pick up. I won't be able to sign it until next week. Should probably see if they are ok with a nursing letter stating the same.

## 2019-12-28 NOTE — Telephone Encounter (Signed)
Dr Celine Mans, please advise on pt email, thanks!  Tasha Wu, [place] sent to [facility] Pool Hello Dr. Celine Mans,   I know at my visit we discussed me returning to work on a limited schedule. If possible, I need some documentation to provide to my supervisor stating when I will be back to work. I didn't mention it before our visit was over. Thank you again for your patience and understanding. I look forward to hearing from you.   Have a great weekend!  [name]

## 2020-01-15 DIAGNOSIS — Z86711 Personal history of pulmonary embolism: Secondary | ICD-10-CM | POA: Insufficient documentation

## 2020-01-25 ENCOUNTER — Encounter: Payer: Self-pay | Admitting: Registered Nurse

## 2020-01-25 NOTE — Telephone Encounter (Signed)
Dr. Celine Mans, Please see patient message and advise.  Thank you.

## 2020-01-25 NOTE — Telephone Encounter (Signed)
Ok to call and give our number here -   Thanks  Rich

## 2020-01-25 NOTE — Telephone Encounter (Signed)
Pt requesting refill enoxaprin last Rx by [facility] Noralee Stain, DO   Please advise pt is running out and cannot contact dr

## 2020-01-26 ENCOUNTER — Encounter: Payer: Self-pay | Admitting: Registered Nurse

## 2020-01-26 ENCOUNTER — Other Ambulatory Visit: Payer: Self-pay

## 2020-01-26 ENCOUNTER — Ambulatory Visit: Payer: POS | Admitting: Registered Nurse

## 2020-01-26 VITALS — BP 101/63 | HR 100 | Temp 98.0°F | Resp 17 | Ht 67.0 in | Wt 140.2 lb

## 2020-01-26 DIAGNOSIS — I2699 Other pulmonary embolism without acute cor pulmonale: Secondary | ICD-10-CM | POA: Diagnosis not present

## 2020-01-26 NOTE — Patient Instructions (Signed)
° ° ° °  If you have lab work done today you will be contacted with your lab results within the next 2 weeks.  If you have not heard from us then please contact us. The fastest way to get your results is to register for My Chart. ° ° °IF you received an x-ray today, you will receive an invoice from [facility]. Please contact [facility] at [phone] with questions or concerns regarding your invoice.  ° °IF you received labwork today, you will receive an invoice from [facility]. Please contact [facility] at [phone] with questions or concerns regarding your invoice.  ° °Our billing staff will not be able to assist you with questions regarding bills from these companies. ° °You will be contacted with the lab results as soon as they are available. The fastest way to get your results is to activate your My Chart account. Instructions are located on the last page of this paperwork. If you have not heard from us regarding the results in 2 weeks, please contact this office. °  ° ° ° °

## 2020-01-26 NOTE — Telephone Encounter (Signed)
Yes we can prescribe her lovenox. Please month supplysend it to wherever she needs with 5 refills. Lovenox: Inject 0.7 mLs (70 mg total) into the skin every 12 (twelve) hours. - Subcutaneous

## 2020-01-26 NOTE — Telephone Encounter (Signed)
Called [facility] at [phone] to give a new RX for Lovenox injections. I was informed that they would expedite them and contact the patient. Message has been sent to patient. Nothing further needed at this time.

## 2020-01-27 MED ORDER — ENOXAPARIN SODIUM 80 MG/0.8ML ~~LOC~~ SOLN: 70.0000 mg | Freq: Two times a day (BID) | SUBCUTANEOUS | 2 refills | Status: DC

## 2020-02-12 ENCOUNTER — Ambulatory Visit (HOSPITAL_COMMUNITY)
Admission: RE | Admit: 2020-02-12 | Discharge: 2020-02-12 | Disposition: A | Discharge status: Home / Self Care | Payer: POS | Source: Ambulatory Visit | Attending: Internal Medicine | Admitting: Internal Medicine

## 2020-02-12 ENCOUNTER — Other Ambulatory Visit: Payer: Self-pay

## 2020-02-12 DIAGNOSIS — I2602 Saddle embolus of pulmonary artery with acute cor pulmonale: Secondary | ICD-10-CM | POA: Insufficient documentation

## 2020-02-12 DIAGNOSIS — Z87891 Personal history of nicotine dependence: Secondary | ICD-10-CM | POA: Insufficient documentation

## 2020-02-12 DIAGNOSIS — K219 Gastro-esophageal reflux disease without esophagitis: Secondary | ICD-10-CM | POA: Insufficient documentation

## 2020-02-12 LAB — ECHOCARDIOGRAM COMPLETE
AR max vel: 2.18 cm2
AV Area VTI: 1.98 cm2
AV Area mean vel: 2.14 cm2
AV Mean grad: 4 mmHg
AV Peak grad: 7.7 mmHg
Ao pk vel: 1.39 m/s
Area-P 1/2: 2.32 cm2
S' Lateral: 2.5 cm

## 2020-02-12 NOTE — Progress Notes (Signed)
  Echocardiogram 2D Echocardiogram has been performed.  Gerda Diss 02/12/2020, 8:51 AM

## 2020-02-15 ENCOUNTER — Ambulatory Visit (HOSPITAL_COMMUNITY): Payer: POS

## 2020-02-15 ENCOUNTER — Encounter: Payer: Self-pay | Admitting: Registered Nurse

## 2020-02-15 NOTE — Progress Notes (Signed)
Established Patient Office Visit  Subjective:  Patient ID: Tasha Wu, female    DOB: August 09, 1990  Age: 29 y.o. MRN: [id number]  CC:  Chief Complaint  Patient presents with  . Hospitalization Follow-up    Bilateral pulmonary embolism     HPI Tasha Wu presents for hosp follow up  Presented to ED on 12/04/19 with chest pain and shob at 9w gestation. P3-0-1-3.  Was determined to have bilateral PE.  Admitted that day and dc on 12/09/19 Started on lovenox, set up with outpatient pulmonology. Pt reports today that symptoms have improved. Taking lovenox as instructed. Plans to follow up closely with pulmonology. No new symptoms of concern. Histories reviewed with patient, beyond pregnancy no apparent risk for PE/clot.  Past Medical History:  Diagnosis Date  . Abnormal Pap smear 12/10/2011  . Anemia   . Anxiety 2010   anxiety attack;no meds;has not been dx'd  . Emotional instability (HCC)    Break down easily when things are not going her way  . GERD (gastroesophageal reflux disease)   . Hx of joint problems    Shoulders and knees have pain  . Infection    Yeast;would get frequently  . Infection    BV x 1  . Kidney infection 2010   PO antibxs  . Kidney stone complicating pregnancy   . Migraines    no rx'd meds in the past;goes to sleep  . PCOS (polycystic ovarian syndrome)   . Sickle cell trait (HCC)   . Sickle cell trait (HCC)   . Umbilical hernia 1991   Does not cause anyp problems  . Vaginal Pap smear, abnormal     Past Surgical History:  Procedure Laterality Date  . DILATION AND CURETTAGE OF UTERUS  2009   No complications  . IR ANGIOGRAM SELECTIVE EACH ADDITIONAL VESSEL  12/05/2019  . IR ANGIOGRAM SELECTIVE EACH ADDITIONAL VESSEL  12/05/2019  . IR INFUSION THROMBOL ARTERIAL INITIAL (MS)  12/05/2019  . IR THROMB F/U EVAL ART/VEN FINAL DAY (MS)  12/06/2019  . IR US GUIDE VASC ACCESS LEFT  12/05/2019  . IR US GUIDE VASC ACCESS LEFT  12/05/2019  . WISDOM  TOOTH EXTRACTION      Family History  Problem Relation Age of Onset  . Sickle cell trait Mother   . Hypertension Mother   . Anemia Mother   . Sickle cell trait Brother   . Sickle cell anemia Maternal Aunt        Recently deceased from [facility] crisis  . Diabetes Maternal Aunt   . Sickle cell anemia Maternal Grandmother        Deceased  . Heart disease Maternal Aunt   . Asthma Sister        1/2 sibling;father's child  . Hyperthyroidism Paternal Grandmother   . Scoliosis Paternal Grandmother   . Arthritis Paternal Grandmother   . Migraines Father   . Other Father        Joint Problems;shoulders and knees  . Hernia Maternal Aunt        Umbilical  . Hypertension Maternal Uncle   . Diabetes type I Cousin        maternal  . Diabetes Paternal Grandfather     Social History   Socioeconomic History  . Marital status: Single    Spouse name: Not on file  . Number of children: 2  . Years of education: 63  . Highest education level: Not on file  Occupational History  . Not on file  Tobacco Use  . Smoking status: Former Smoker    Packs/day: 0.25    Years: 5.00    Pack years: 1.25    Types: Cigarettes    Quit date: 11/18/2011    Years since quitting: 8.2  . Smokeless tobacco: Never Used  Vaping Use  . Vaping Use: Never used  Substance and Sexual Activity  . Alcohol use: Not Currently    Comment: occ  . Drug use: No  . Sexual activity: Yes    Partners: Male    Birth control/protection: None  Other Topics Concern  . Not on file  Social History Narrative  . Not on file   Social Determinants of Health   Financial Resource Strain: Not on file  Food Insecurity: Not on file  Transportation Needs: Not on file  Physical Activity: Not on file  Stress: Not on file  Social Connections: Not on file  Intimate Partner Violence: Not on file    Outpatient Medications Prior to Visit  Medication Sig Dispense Refill  . albuterol (VENTOLIN HFA) 108 (90 Base) MCG/ACT inhaler Inhale 2  puffs into the lungs every 6 (six) hours as needed for wheezing or shortness of breath. 8 g 0  . cetirizine (ZYRTEC) 10 MG tablet Take 1 tablet (10 mg total) by mouth daily. 30 tablet 11  . Prenatal Vit-Fe Fumarate-FA (MULTIVITAMIN-PRENATAL) 27-0.8 MG TABS tablet Take 1 tablet by mouth daily at 12 noon.    . Doxylamine-Pyridoxine 10-10 MG TBEC Take 10 mg by mouth at bedtime. (Patient not taking: Reported on 12/24/2019) 60 tablet 0  . enoxaparin (LOVENOX) 80 MG/0.8ML injection Inject 0.7 mLs (70 mg total) into the skin every 12 (twelve) hours. 42 mL 2   No facility-administered medications prior to visit.    No Known Allergies  ROS Review of Systems  Constitutional: Negative.   HENT: Negative.   Eyes: Negative.   Respiratory: Negative.   Cardiovascular: Negative.   Gastrointestinal: Negative.   Genitourinary: Negative.   Musculoskeletal: Negative.   Skin: Negative.   Neurological: Negative.   Psychiatric/Behavioral: Negative.   All other systems reviewed and are negative.     Objective:    Physical Exam Vitals and nursing note reviewed.  Constitutional:      General: She is not in acute distress.    Appearance: Normal appearance. She is normal weight. She is not ill-appearing, toxic-appearing or diaphoretic.  Cardiovascular:     Rate and Rhythm: Normal rate and regular rhythm.     Heart sounds: Normal heart sounds. No murmur heard. No friction rub. No gallop.   Pulmonary:     Effort: Pulmonary effort is normal. No respiratory distress.     Breath sounds: Normal breath sounds. No stridor. No wheezing, rhonchi or rales.  Chest:     Chest wall: No tenderness.  Skin:    General: Skin is warm and dry.  Neurological:     General: No focal deficit present.     Mental Status: She is alert and oriented to person, place, and time. Mental status is at baseline.  Psychiatric:        Mood and Affect: Mood normal.        Behavior: Behavior normal.        Thought Content: Thought  content normal.        Judgment: Judgment normal.     BP 110/76 (BP Location: Right Arm, Patient Position: Sitting, Cuff Size: Normal)   Pulse 99   Temp 98.3 F (36.8 C) (Temporal)  Ht 5\' 7"  (1.702 m)   Wt 137 lb 12.8 oz (62.5 kg)   LMP 09/24/2019   SpO2 99%   BMI 21.58 kg/m  Wt Readings from Last 3 Encounters:  01/26/20 140 lb 3.2 oz (63.6 kg)  12/24/19 134 lb (60.8 kg)  12/15/19 137 lb 12.8 oz (62.5 kg)     Health Maintenance Due  Topic Date Due  . COVID-19 Vaccine (1) Never done  . PAP SMEAR-Modifier  11/29/2014    There are no preventive care reminders to display for this patient.  No results found for: TSH Lab Results  Component Value Date   WBC 6.3 12/15/2019   HGB 11.2 12/15/2019   HCT 33.2 (L) 12/15/2019   MCV 82 12/15/2019   PLT 183 12/09/2019   Lab Results  Component Value Date   NA 135 12/15/2019   K 4.6 12/15/2019   CO2 18 (L) 12/15/2019   GLUCOSE 66 12/15/2019   BUN 11 12/15/2019   CREATININE 0.66 12/15/2019   BILITOT 0.3 12/15/2019   ALKPHOS 53 12/15/2019   AST 16 12/15/2019   ALT 9 12/15/2019   PROT 7.2 12/15/2019   ALBUMIN 4.5 12/15/2019   CALCIUM 9.6 12/15/2019   ANIONGAP 8 12/09/2019   No results found for: CHOL No results found for: HDL No results found for: LDLCALC No results found for: TRIG No results found for: CHOLHDL Lab Results  Component Value Date   HGBA1C 5.2 12/15/2019      Assessment & Plan:   Problem List Items Addressed This Visit      Cardiovascular and Mediastinum   Bilateral pulmonary embolism (HCC) - Primary   Relevant Orders   CBC With Differential (Completed)   POCT glycosylated hemoglobin (Hb A1C) (Completed)   Protime-INR (Completed)   Iron, TIBC and Ferritin Panel (Completed)   Comprehensive metabolic panel (Completed)      No orders of the defined types were placed in this encounter.   Follow-up: Return in about 6 weeks (around 01/26/2020).   PLAN  Repeat labs, including follow up for  past anemia. Will follow up as warranted  Return in 4-6 weeks  Close follow up with pulmonology and OB a must - pt understands importance of this  Patient encouraged to call clinic with any questions, comments, or concerns.  01/28/2020, NP

## 2020-03-03 ENCOUNTER — Encounter: Payer: Self-pay | Admitting: Registered Nurse

## 2020-03-03 NOTE — Telephone Encounter (Signed)
Patient sent email.  Hello, I have a question concerning a knot that has formed inside my right buttock. I noticed it about 2 weeks ago in the shower. It was only palpable at that time. Now it is visible to the eye, its hard, and has some dark green bruising, it itches to the touch, and can be felt when lying down on my back or side. Im afraid it could be a clot that has formed. Knowing my recent history, I just want the best care & medical advice.    Patient was advise to contact her PCP as Dr. Celine Mans would not be back until 03/07/20 and to allow for 48 hours for a response  If any further recommendations Dr. Celine Mans please advise

## 2020-03-05 NOTE — L&D Delivery Note (Signed)
Delivery Note Labor onset: 06/23/2020  Labor Onset Time: 2000 Complete dilation at  2104 Onset of pushing at 2107  FHR second stage Cat 2 Analgesia/Anesthesia intrapartum: epidural  Guided pushing with maternal urge. Delivery of a viable female at 2111 Fetal head delivered in OA with restitution to LOA position.  Nuchal cord: x 1 Infant placed on maternal abd, dried, and tactile stim. Spontaneous cry.  Cord double clamped after pulsation ceased and cut by FOB.  2 RNs present for birth.  Cord blood sample collected. Arterial cord blood sample collected: N/A  Placenta delivered Tasha Wu via Tasha Wu Tasha Wu, intact, with 3 VC.  AMTSL Placenta to L&D. Uterine tone firm, bleeding minimal  No laceration identified.  Anesthesia: epidural Repair: N/A QBL/EBL (mL): 100 Complications: Nuchal x 1 APGAR: APGAR (1 MIN): 9   APGAR (5 MINS): 9   APGAR (10 MINS):   Mom to postpartum.  Baby to Couplet care / Skin to Skin. Lovenox to start 6 hours after delivery SCD continued until Lovenox started.  Dr Normand Sloop notified of delivery and POC reviewed  Carollee Leitz MSN, CNM 06/23/2020, 9:39 PM

## 2020-03-30 ENCOUNTER — Encounter: Payer: Self-pay | Admitting: Registered Nurse

## 2020-03-30 NOTE — Progress Notes (Signed)
Established Patient Office Visit  Subjective:  Patient ID: Tasha Wu, female    DOB: [birth year]  Age: 30 y.o. MRN: [id number]  CC:  Chief Complaint  Patient presents with  . Follow-up    Patient is here for a 6 month follow up. and a medication    HPI Tasha Wu presents for 6 week f/u on PE  Has been taking thinners as prescribed Feeling well No AEs from meds No ongoing symptoms of dvt or PE  No other concerns.  Past Medical History:  Diagnosis Date  . Abnormal Pap smear 12/10/2011  . Anemia   . Anxiety 2010   anxiety attack;no meds;has not been dx'd  . Emotional instability (HCC)    Break down easily when things are not going her way  . GERD (gastroesophageal reflux disease)   . Hx of joint problems    Shoulders and knees have pain  . Infection    Yeast;would get frequently  . Infection    BV x 1  . Kidney infection 2010   PO antibxs  . Kidney stone complicating pregnancy   . Migraines    no rx'd meds in the past;goes to sleep  . PCOS (polycystic ovarian syndrome)   . Sickle cell trait (HCC)   . Sickle cell trait (HCC)   . Umbilical hernia 1991   Does not cause anyp problems  . Vaginal Pap smear, abnormal     Past Surgical History:  Procedure Laterality Date  . DILATION AND CURETTAGE OF UTERUS  2009   No complications  . IR ANGIOGRAM SELECTIVE EACH ADDITIONAL VESSEL  12/05/2019  . IR ANGIOGRAM SELECTIVE EACH ADDITIONAL VESSEL  12/05/2019  . IR INFUSION THROMBOL ARTERIAL INITIAL (MS)  12/05/2019  . IR THROMB F/U EVAL ART/VEN FINAL DAY (MS)  12/06/2019  . IR US GUIDE VASC ACCESS LEFT  12/05/2019  . IR US GUIDE VASC ACCESS LEFT  12/05/2019  . WISDOM TOOTH EXTRACTION      Family History  Problem Relation Age of Onset  . Sickle cell trait Mother   . Hypertension Mother   . Anemia Mother   . Sickle cell trait Brother   . Sickle cell anemia Maternal Aunt        Recently deceased from [facility] crisis  . Diabetes Maternal Aunt   . Sickle cell anemia  Maternal Grandmother        Deceased  . Heart disease Maternal Aunt   . Asthma Sister        1/2 sibling;father's child  . Hyperthyroidism Paternal Grandmother   . Scoliosis Paternal Grandmother   . Arthritis Paternal Grandmother   . Migraines Father   . Other Father        Joint Problems;shoulders and knees  . Hernia Maternal Aunt        Umbilical  . Hypertension Maternal Uncle   . Diabetes type I Cousin        maternal  . Diabetes Paternal Grandfather     Social History   Socioeconomic History  . Marital status: Single    Spouse name: Not on file  . Number of children: 2  . Years of education: 7  . Highest education level: Not on file  Occupational History  . Not on file  Tobacco Use  . Smoking status: Former Smoker    Packs/day: 0.25    Years: 5.00    Pack years: 1.25    Types: Cigarettes    Quit date: 11/18/2011    Years  since quitting: 8.3  . Smokeless tobacco: Never Used  Vaping Use  . Vaping Use: Never used  Substance and Sexual Activity  . Alcohol use: Not Currently    Comment: occ  . Drug use: No  . Sexual activity: Yes    Partners: Male    Birth control/protection: None  Other Topics Concern  . Not on file  Social History Narrative  . Not on file   Social Determinants of Health   Financial Resource Strain: Not on file  Food Insecurity: Not on file  Transportation Needs: Not on file  Physical Activity: Not on file  Stress: Not on file  Social Connections: Not on file  Intimate Partner Violence: Not on file    Outpatient Medications Prior to Visit  Medication Sig Dispense Refill  . albuterol (VENTOLIN HFA) 108 (90 Base) MCG/ACT inhaler Inhale 2 puffs into the lungs every 6 (six) hours as needed for wheezing or shortness of breath. 8 g 0  . cetirizine (ZYRTEC) 10 MG tablet Take 1 tablet (10 mg total) by mouth daily. 30 tablet 11  . Prenatal Vit-Fe Fumarate-FA (MULTIVITAMIN-PRENATAL) 27-0.8 MG TABS tablet Take 1 tablet by mouth daily at 12  noon.    . enoxaparin (LOVENOX) 80 MG/0.8ML injection Inject 0.7 mLs (70 mg total) into the skin every 12 (twelve) hours. 42 mL 2   No facility-administered medications prior to visit.    No Known Allergies  ROS Review of Systems  Constitutional: Negative.   HENT: Negative.   Eyes: Negative.   Respiratory: Negative.   Cardiovascular: Negative.   Gastrointestinal: Negative.   Genitourinary: Negative.   Musculoskeletal: Negative.   Skin: Negative.   Neurological: Negative.   Psychiatric/Behavioral: Negative.   All other systems reviewed and are negative.     Objective:    Physical Exam Vitals and nursing note reviewed.  Constitutional:      General: She is not in acute distress.    Appearance: Normal appearance. She is normal weight. She is not ill-appearing, toxic-appearing or diaphoretic.  Cardiovascular:     Rate and Rhythm: Normal rate and regular rhythm.     Heart sounds: Normal heart sounds. No murmur heard. No friction rub. No gallop.   Pulmonary:     Effort: Pulmonary effort is normal. No respiratory distress.     Breath sounds: Normal breath sounds. No stridor. No wheezing, rhonchi or rales.  Chest:     Chest wall: No tenderness.  Skin:    General: Skin is warm and dry.  Neurological:     General: No focal deficit present.     Mental Status: She is alert and oriented to person, place, and time. Mental status is at baseline.  Psychiatric:        Mood and Affect: Mood normal.        Behavior: Behavior normal.        Thought Content: Thought content normal.        Judgment: Judgment normal.     BP 101/63   Pulse 100   Temp 98 F (36.7 C) (Temporal)   Resp 17   Ht 5\' 7"  (1.702 m)   Wt 140 lb 3.2 oz (63.6 kg)   LMP 09/24/2019   SpO2 100%   BMI 21.96 kg/m  Wt Readings from Last 3 Encounters:  01/26/20 140 lb 3.2 oz (63.6 kg)  12/24/19 134 lb (60.8 kg)  12/15/19 137 lb 12.8 oz (62.5 kg)     Health Maintenance Due  Topic Date Due  .  COVID-19  Vaccine (1) Never done  . PAP SMEAR-Modifier  11/29/2014    There are no preventive care reminders to display for this patient.  No results found for: TSH Lab Results  Component Value Date   WBC 6.3 12/15/2019   HGB 11.2 12/15/2019   HCT 33.2 (L) 12/15/2019   MCV 82 12/15/2019   PLT 183 12/09/2019   Lab Results  Component Value Date   NA 135 12/15/2019   K 4.6 12/15/2019   CO2 18 (L) 12/15/2019   GLUCOSE 66 12/15/2019   BUN 11 12/15/2019   CREATININE 0.66 12/15/2019   BILITOT 0.3 12/15/2019   ALKPHOS 53 12/15/2019   AST 16 12/15/2019   ALT 9 12/15/2019   PROT 7.2 12/15/2019   ALBUMIN 4.5 12/15/2019   CALCIUM 9.6 12/15/2019   ANIONGAP 8 12/09/2019   No results found for: CHOL No results found for: HDL No results found for: LDLCALC No results found for: TRIG No results found for: CHOLHDL Lab Results  Component Value Date   HGBA1C 5.2 12/15/2019      Assessment & Plan:   Problem List Items Addressed This Visit      Cardiovascular and Mediastinum   Bilateral pulmonary embolism (HCC) - Primary      No orders of the defined types were placed in this encounter.   Follow-up: No follow-ups on file.   PLAN  Continue regimen  Reviewed symptoms, closely monitor  Continue care with obgyn  Patient encouraged to call clinic with any questions, comments, or concerns.  Janeece Agee, NP

## 2020-04-14 DIAGNOSIS — O99019 Anemia complicating pregnancy, unspecified trimester: Secondary | ICD-10-CM | POA: Insufficient documentation

## 2020-06-02 NOTE — Telephone Encounter (Signed)
Patient sent email   Hello,  I hope all has been well. I am currently [redacted] weeks pregnant. I am due 06/30/20. At my appointment last year, we discussed taking me off of the Lovenox injections prior to delivery at some point and having another ECG performed. I spoke with a nurse here at [facility] and she gave a medical opinion that I Would just be taken off of the injections for atleast 48 hours before they induced around 39 weeks. I'm afraid that even if we wait that long, there's a chance I could go into labor and still have the blood thinner in my system. Which means no epidural. Please help!  Sending to Dr. Celine Mans for recommendations.

## 2020-06-09 ENCOUNTER — Other Ambulatory Visit: Payer: Self-pay

## 2020-06-09 ENCOUNTER — Inpatient Hospital Stay (HOSPITAL_COMMUNITY)
Admission: AD | Admit: 2020-06-09 | Discharge: 2020-06-09 | Disposition: A | Discharge status: Home / Self Care | Payer: POS | Attending: Obstetrics & Gynecology | Admitting: Obstetrics & Gynecology

## 2020-06-09 DIAGNOSIS — Z3A37 37 weeks gestation of pregnancy: Secondary | ICD-10-CM

## 2020-06-09 DIAGNOSIS — O479 False labor, unspecified: Secondary | ICD-10-CM | POA: Diagnosis not present

## 2020-06-09 DIAGNOSIS — O471 False labor at or after 37 completed weeks of gestation: Secondary | ICD-10-CM | POA: Insufficient documentation

## 2020-06-09 NOTE — Discharge Instructions (Signed)
[employer]'s Emergency Medicine: Concepts and Clinical Practice (9th ed., pp. 2296- 2312). Elsevier.">  Braxton Hicks Contractions Contractions of the uterus can occur throughout pregnancy, but they are not always a sign that you are in labor. You may have practice contractions called Braxton Hicks contractions. These false labor contractions are sometimes confused with true labor. What are Braxton Hicks contractions? Braxton Hicks contractions are tightening movements that occur in the muscles of the uterus before labor. Unlike true labor contractions, these contractions do not result in opening (dilation) and thinning of the cervix. Toward the end of pregnancy (32-34 weeks), Braxton Hicks contractions can happen more often and may become stronger. These contractions are sometimes difficult to tell apart from true labor because they can be very uncomfortable. You should not feel embarrassed if you go to the hospital with false labor. Sometimes, the only way to tell if you are in true labor is for your health care provider to look for changes in the cervix. The health care provider will do a physical exam and may monitor your contractions. If you are not in true labor, the exam should show that your cervix is not dilating and your water has not broken. If there are no other health problems associated with your pregnancy, it is completely safe for you to be sent home with false labor. You may continue to have Braxton Hicks contractions until you go into true labor. How to tell the difference between true labor and false labor True labor  Contractions last 30-70 seconds.  Contractions become very regular.  Discomfort is usually felt in the top of the uterus, and it spreads to the lower abdomen and low back.  Contractions do not go away with walking.  Contractions usually become more intense and increase in frequency.  The cervix dilates and gets thinner. False labor  Contractions are usually shorter  and not as strong as true labor contractions.  Contractions are usually irregular.  Contractions are often felt in the front of the lower abdomen and in the groin.  Contractions may go away when you walk around or change positions while lying down.  Contractions get weaker and are shorter-lasting as time goes on.  The cervix usually does not dilate or become thin. Follow these instructions at home:  Take over-the-counter and prescription medicines only as told by your health care provider.  Keep up with your usual exercises and follow other instructions from your health care provider.  Eat and drink lightly if you think you are going into labor.  If Braxton Hicks contractions are making you uncomfortable: ? Change your position from lying down or resting to walking, or change from walking to resting. ? Sit and rest in a tub of warm water. ? Drink enough fluid to keep your urine pale yellow. Dehydration may cause these contractions. ? Do slow and deep breathing several times an hour.  Keep all follow-up prenatal visits as told by your health care provider. This is important.   Contact a health care provider if:  You have a fever.  You have continuous pain in your abdomen. Get help right away if:  Your contractions become stronger, more regular, and closer together.  You have fluid leaking or gushing from your vagina.  You pass blood-tinged mucus (bloody show).  You have bleeding from your vagina.  You have low back pain that you never had before.  You feel your baby's head pushing down and causing pelvic pressure.  Your baby is not moving inside   you as much as it used to. Summary  Contractions that occur before labor are called Braxton Hicks contractions, false labor, or practice contractions.  Braxton Hicks contractions are usually shorter, weaker, farther apart, and less regular than true labor contractions. True labor contractions usually become progressively  stronger and regular, and they become more frequent.  Manage discomfort from Braxton Hicks contractions by changing position, resting in a warm bath, drinking plenty of water, or practicing deep breathing. This information is not intended to replace advice given to you by your health care provider. Make sure you discuss any questions you have with your health care provider. Document Revised: 02/01/2017 Document Reviewed: 07/05/2016 Elsevier Patient Education  2021 Elsevier Inc.   Fetal Movement Counts Patient Name: ________________________________________________ Patient Due Date: ____________________  What is a fetal movement count? A fetal movement count is the number of times that you feel your baby move during a certain amount of time. This may also be called a fetal kick count. A fetal movement count is recommended for every pregnant woman. You may be asked to start counting fetal movements as early as week 28 of your pregnancy. Pay attention to when your baby is most active. You may notice your baby's sleep and wake cycles. You may also notice things that make your baby move more. You should do a fetal movement count:  When your baby is normally most active.  At the same time each day. A good time to count movements is while you are resting, after having something to eat and drink. How do I count fetal movements? 1. Find a quiet, comfortable area. Sit, or lie down on your side. 2. Write down the date, the start time and stop time, and the number of movements that you felt between those two times. Take this information with you to your health care visits. 3. Write down your start time when you feel the first movement. 4. Count kicks, flutters, swishes, rolls, and jabs. You should feel at least 10 movements. 5. You may stop counting after you have felt 10 movements, or if you have been counting for 2 hours. Write down the stop time. 6. If you do not feel 10 movements in 2 hours, contact  your health care provider for further instructions. Your health care provider may want to do additional tests to assess your baby's well-being. Contact a health care provider if:  You feel fewer than 10 movements in 2 hours.  Your baby is not moving like he or she usually does. Date: ____________ Start time: ____________ Stop time: ____________ Movements: ____________ Date: ____________ Start time: ____________ Stop time: ____________ Movements: ____________ Date: ____________ Start time: ____________ Stop time: ____________ Movements: ____________ Date: ____________ Start time: ____________ Stop time: ____________ Movements: ____________ Date: ____________ Start time: ____________ Stop time: ____________ Movements: ____________ Date: ____________ Start time: ____________ Stop time: ____________ Movements: ____________ Date: ____________ Start time: ____________ Stop time: ____________ Movements: ____________ Date: ____________ Start time: ____________ Stop time: ____________ Movements: ____________ Date: ____________ Start time: ____________ Stop time: ____________ Movements: ____________ This information is not intended to replace advice given to you by your health care provider. Make sure you discuss any questions you have with your health care provider. Document Revised: 10/09/2018 Document Reviewed: 10/09/2018 Elsevier Patient Education  2021 Elsevier Inc.  

## 2020-06-09 NOTE — MAU Provider Note (Signed)
S: [name] is a 29 y.o. [phone] at [redacted]w[redacted]d  who presents to MAU today for labor evaluation.     Cervical exam by RN:  Dilation: 1.5 Effacement (%): Thick Cervical Position: Posterior Station: Ballotable Presentation: Vertex Exam by:: Rhunette Croft RNC  Fetal Monitoring: Baseline: 130 Variability: moderate variability Accelerations: multiple, reactive strip Decelerations: none Contractions: irregular  MDM Discussed patient with RN. NST reviewed.   A: [facility] at [redacted]w[redacted]d  False labor  P: Discharge home Labor precautions and kick counts included in AVS Patient to follow-up with [facility] as previously scheduled  Patient may return to MAU as needed or when in labor   Azura Tufaro, Skipper Cliche, MD OB Fellow, [facility] 06/09/2020 11:25 PM

## 2020-06-09 NOTE — MAU Note (Signed)
Was seen in office at lunch time and 1cm. Ctxs since leaving office. Denies LOF. Some mucous d/c. Good FM. Hx PE at 10wks. Last Lovenox inj last night at 2200

## 2020-06-15 ENCOUNTER — Encounter (HOSPITAL_COMMUNITY): Payer: Self-pay | Admitting: *Deleted

## 2020-06-15 ENCOUNTER — Telehealth (HOSPITAL_COMMUNITY): Payer: Self-pay | Admitting: *Deleted

## 2020-06-15 ENCOUNTER — Other Ambulatory Visit: Payer: Self-pay | Admitting: Obstetrics and Gynecology

## 2020-06-15 NOTE — Telephone Encounter (Signed)
Preadmission screen  

## 2020-06-21 ENCOUNTER — Other Ambulatory Visit (HOSPITAL_COMMUNITY)
Admission: RE | Admit: 2020-06-21 | Discharge: 2020-06-21 | Disposition: A | Discharge status: Home / Self Care | Payer: POS | Source: Ambulatory Visit | Attending: Obstetrics and Gynecology | Admitting: Obstetrics and Gynecology

## 2020-06-21 DIAGNOSIS — Z01812 Encounter for preprocedural laboratory examination: Secondary | ICD-10-CM | POA: Insufficient documentation

## 2020-06-21 DIAGNOSIS — Z20822 Contact with and (suspected) exposure to covid-19: Secondary | ICD-10-CM | POA: Insufficient documentation

## 2020-06-21 LAB — SARS CORONAVIRUS 2 (TAT 6-24 HRS): SARS Coronavirus 2: NEGATIVE

## 2020-06-23 ENCOUNTER — Other Ambulatory Visit: Payer: Self-pay

## 2020-06-23 ENCOUNTER — Inpatient Hospital Stay (HOSPITAL_COMMUNITY): Payer: POS | Admitting: Anesthesiology

## 2020-06-23 ENCOUNTER — Inpatient Hospital Stay (HOSPITAL_COMMUNITY): Payer: POS

## 2020-06-23 ENCOUNTER — Inpatient Hospital Stay (HOSPITAL_COMMUNITY)
Admission: AD | Admit: 2020-06-23 | Discharge: 2020-06-25 | DRG: 807 | Disposition: A | Discharge status: Home / Self Care | Payer: POS | Attending: Obstetrics and Gynecology | Admitting: Obstetrics and Gynecology

## 2020-06-23 ENCOUNTER — Encounter (HOSPITAL_COMMUNITY): Payer: Self-pay | Admitting: Obstetrics and Gynecology

## 2020-06-23 DIAGNOSIS — O99013 Anemia complicating pregnancy, third trimester: Secondary | ICD-10-CM | POA: Diagnosis present

## 2020-06-23 DIAGNOSIS — Z20822 Contact with and (suspected) exposure to covid-19: Secondary | ICD-10-CM | POA: Diagnosis present

## 2020-06-23 DIAGNOSIS — Z3A39 39 weeks gestation of pregnancy: Secondary | ICD-10-CM

## 2020-06-23 DIAGNOSIS — O26893 Other specified pregnancy related conditions, third trimester: Secondary | ICD-10-CM | POA: Diagnosis present

## 2020-06-23 DIAGNOSIS — Z87891 Personal history of nicotine dependence: Secondary | ICD-10-CM

## 2020-06-23 DIAGNOSIS — Z7901 Long term (current) use of anticoagulants: Secondary | ICD-10-CM | POA: Diagnosis not present

## 2020-06-23 DIAGNOSIS — D573 Sickle-cell trait: Secondary | ICD-10-CM | POA: Diagnosis present

## 2020-06-23 DIAGNOSIS — Z3490 Encounter for supervision of normal pregnancy, unspecified, unspecified trimester: Secondary | ICD-10-CM | POA: Diagnosis present

## 2020-06-23 DIAGNOSIS — Z86711 Personal history of pulmonary embolism: Secondary | ICD-10-CM

## 2020-06-23 DIAGNOSIS — O9902 Anemia complicating childbirth: Secondary | ICD-10-CM | POA: Diagnosis present

## 2020-06-23 LAB — RAPID HIV SCREEN (HIV 1/2 AB+AG)
HIV 1/2 Antibodies: NONREACTIVE
HIV-1 P24 Antigen - HIV24: NONREACTIVE

## 2020-06-23 LAB — TYPE AND SCREEN
ABO/RH(D): O POS
Antibody Screen: NEGATIVE

## 2020-06-23 LAB — CBC
HCT: 24.7 % — ABNORMAL LOW (ref 36.0–46.0)
Hemoglobin: 7.8 g/dL — ABNORMAL LOW (ref 12.0–15.0)
MCH: 22.3 pg — ABNORMAL LOW (ref 26.0–34.0)
MCHC: 31.6 g/dL (ref 30.0–36.0)
MCV: 70.6 fL — ABNORMAL LOW (ref 80.0–100.0)
Platelets: 320 10*3/uL (ref 150–400)
RBC: 3.5 MIL/uL — ABNORMAL LOW (ref 3.87–5.11)
RDW: 14.5 % (ref 11.5–15.5)
WBC: 7.7 10*3/uL (ref 4.0–10.5)
nRBC: 0 % (ref 0.0–0.2)

## 2020-06-23 MED ORDER — COCONUT OIL OIL: 1.0000 "application " | TOPICAL_OIL | Status: DC | PRN

## 2020-06-23 MED ORDER — OXYTOCIN BOLUS FROM INFUSION
333.0000 mL | Freq: Once | INTRAVENOUS | Status: AC
  Administered 2020-06-23: 333 mL via INTRAVENOUS

## 2020-06-23 MED ORDER — LIDOCAINE HCL (PF) 1 % IJ SOLN: 30.0000 mL | INTRAMUSCULAR | Status: DC | PRN

## 2020-06-23 MED ORDER — DIPHENHYDRAMINE HCL 25 MG PO CAPS: 25.0000 mg | ORAL_CAPSULE | Freq: Four times a day (QID) | ORAL | Status: DC | PRN

## 2020-06-23 MED ORDER — EPHEDRINE 5 MG/ML INJ: 10.0000 mg | INTRAVENOUS | Status: DC | PRN

## 2020-06-23 MED ORDER — OXYTOCIN-SODIUM CHLORIDE 30-0.9 UT/500ML-% IV SOLN: 2.5000 [IU]/h | INTRAVENOUS | Status: DC

## 2020-06-23 MED ORDER — DIPHENHYDRAMINE HCL 50 MG/ML IJ SOLN: 12.5000 mg | INTRAMUSCULAR | Status: DC | PRN

## 2020-06-23 MED ORDER — SENNOSIDES-DOCUSATE SODIUM 8.6-50 MG PO TABS
2.0000 | ORAL_TABLET | Freq: Every day | ORAL | Status: DC
  Administered 2020-06-24: 2 via ORAL
  Filled 2020-06-23: qty 2

## 2020-06-23 MED ORDER — LIDOCAINE HCL (PF) 1 % IJ SOLN
INTRAMUSCULAR | Status: DC | PRN
  Administered 2020-06-23: 11 mL via EPIDURAL

## 2020-06-23 MED ORDER — ACETAMINOPHEN 325 MG PO TABS
650.0000 mg | ORAL_TABLET | ORAL | Status: DC | PRN
  Administered 2020-06-23: 650 mg via ORAL
  Filled 2020-06-23: qty 2

## 2020-06-23 MED ORDER — OXYCODONE-ACETAMINOPHEN 5-325 MG PO TABS: 2.0000 | ORAL_TABLET | ORAL | Status: DC | PRN

## 2020-06-23 MED ORDER — ONDANSETRON HCL 4 MG/2ML IJ SOLN: 4.0000 mg | Freq: Four times a day (QID) | INTRAMUSCULAR | Status: DC | PRN

## 2020-06-23 MED ORDER — PHENYLEPHRINE 40 MCG/ML (10ML) SYRINGE FOR IV PUSH (FOR BLOOD PRESSURE SUPPORT): 80.0000 ug | PREFILLED_SYRINGE | INTRAVENOUS | Status: DC | PRN

## 2020-06-23 MED ORDER — LACTATED RINGERS IV SOLN: 500.0000 mL | INTRAVENOUS | Status: DC | PRN

## 2020-06-23 MED ORDER — FENTANYL-BUPIVACAINE-NACL 0.5-0.125-0.9 MG/250ML-% EP SOLN
12.0000 mL/h | EPIDURAL | Status: DC | PRN
  Administered 2020-06-23: 12 mL/h via EPIDURAL
  Filled 2020-06-23: qty 250

## 2020-06-23 MED ORDER — BENZOCAINE-MENTHOL 20-0.5 % EX AERO: 1.0000 "application " | INHALATION_SPRAY | CUTANEOUS | Status: DC | PRN

## 2020-06-23 MED ORDER — SOD CITRATE-CITRIC ACID 500-334 MG/5ML PO SOLN: 30.0000 mL | ORAL | Status: DC | PRN

## 2020-06-23 MED ORDER — SIMETHICONE 80 MG PO CHEW: 80.0000 mg | CHEWABLE_TABLET | ORAL | Status: DC | PRN

## 2020-06-23 MED ORDER — WITCH HAZEL-GLYCERIN EX PADS: 1.0000 "application " | MEDICATED_PAD | CUTANEOUS | Status: DC | PRN

## 2020-06-23 MED ORDER — TERBUTALINE SULFATE 1 MG/ML IJ SOLN: 0.2500 mg | Freq: Once | INTRAMUSCULAR | Status: DC | PRN

## 2020-06-23 MED ORDER — OXYCODONE HCL 5 MG PO TABS: 5.0000 mg | ORAL_TABLET | ORAL | Status: DC | PRN

## 2020-06-23 MED ORDER — ZOLPIDEM TARTRATE 5 MG PO TABS: 5.0000 mg | ORAL_TABLET | Freq: Every evening | ORAL | Status: DC | PRN

## 2020-06-23 MED ORDER — LACTATED RINGERS IV SOLN
500.0000 mL | Freq: Once | INTRAVENOUS | Status: AC
  Administered 2020-06-23: 500 mL via INTRAVENOUS

## 2020-06-23 MED ORDER — ENOXAPARIN SODIUM 80 MG/0.8ML ~~LOC~~ SOLN
70.0000 mg | Freq: Two times a day (BID) | SUBCUTANEOUS | Status: DC
  Administered 2020-06-24 – 2020-06-25 (×3): 70 mg via SUBCUTANEOUS
  Filled 2020-06-23 (×3): qty 0.8

## 2020-06-23 MED ORDER — ONDANSETRON HCL 4 MG PO TABS: 4.0000 mg | ORAL_TABLET | ORAL | Status: DC | PRN

## 2020-06-23 MED ORDER — IBUPROFEN 600 MG PO TABS
600.0000 mg | ORAL_TABLET | Freq: Four times a day (QID) | ORAL | Status: DC
  Administered 2020-06-24 – 2020-06-25 (×7): 600 mg via ORAL
  Filled 2020-06-23 (×7): qty 1

## 2020-06-23 MED ORDER — ONDANSETRON HCL 4 MG/2ML IJ SOLN: 4.0000 mg | INTRAMUSCULAR | Status: DC | PRN

## 2020-06-23 MED ORDER — OXYCODONE-ACETAMINOPHEN 5-325 MG PO TABS: 1.0000 | ORAL_TABLET | ORAL | Status: DC | PRN

## 2020-06-23 MED ORDER — ACETAMINOPHEN 325 MG PO TABS
650.0000 mg | ORAL_TABLET | ORAL | Status: DC | PRN
  Administered 2020-06-24 (×2): 650 mg via ORAL
  Filled 2020-06-23 (×2): qty 2

## 2020-06-23 MED ORDER — TETANUS-DIPHTH-ACELL PERTUSSIS 5-2.5-18.5 LF-MCG/0.5 IM SUSY: 0.5000 mL | PREFILLED_SYRINGE | Freq: Once | INTRAMUSCULAR | Status: DC

## 2020-06-23 MED ORDER — DIBUCAINE (PERIANAL) 1 % EX OINT: 1.0000 "application " | TOPICAL_OINTMENT | CUTANEOUS | Status: DC | PRN

## 2020-06-23 MED ORDER — OXYTOCIN-SODIUM CHLORIDE 30-0.9 UT/500ML-% IV SOLN
1.0000 m[IU]/min | INTRAVENOUS | Status: DC
  Administered 2020-06-23: 2 m[IU]/min via INTRAVENOUS
  Filled 2020-06-23: qty 500

## 2020-06-23 MED ORDER — OXYCODONE HCL 5 MG PO TABS
10.0000 mg | ORAL_TABLET | ORAL | Status: DC | PRN
Start: 2020-06-23 — End: 2020-06-25

## 2020-06-23 MED ORDER — FENTANYL CITRATE (PF) 100 MCG/2ML IJ SOLN: 50.0000 ug | INTRAMUSCULAR | Status: DC | PRN

## 2020-06-23 MED ORDER — PRENATAL MULTIVITAMIN CH
1.0000 | ORAL_TABLET | Freq: Every day | ORAL | Status: DC
  Administered 2020-06-24 – 2020-06-25 (×2): 1 via ORAL
  Filled 2020-06-23 (×2): qty 1

## 2020-06-23 MED ORDER — MISOPROSTOL 25 MCG QUARTER TABLET
25.0000 ug | ORAL_TABLET | ORAL | Status: DC | PRN
  Administered 2020-06-23: 25 ug via VAGINAL
  Filled 2020-06-23: qty 1

## 2020-06-23 MED ORDER — LACTATED RINGERS IV SOLN: INTRAVENOUS | Status: DC

## 2020-06-23 NOTE — Progress Notes (Signed)
Tasha Wu is a 30 y.o. [id number] at [redacted]w[redacted]d admitted for IOL for Hx of bilateral PE @ 10 wks (10/21). Lovenox d/c 06/20/20.  Subjective:  Tasha Wu is resting comfortably with epidural in place. R/B/A of AROM discussed. Pt requesting to proceed. AROM complete - clear fluid. Pitocin infusing. CAT 1.  Objective: BP 112/77   Pulse (!) 103   Temp 98.4 F (36.9 C) (Axillary)   Resp 15   Ht 5\' 7"  (1.702 m)   Wt 74.6 kg   LMP 09/24/2019   SpO2 100%   BMI 25.75 kg/m  No intake/output data recorded. No intake/output data recorded.  Strict I&O ordered.  FHT:  FHR: 125 bpm, variability: moderate,  accelerations:  Present,  decelerations:  Absent UC:   2-4 min SVE:   Dilation: 4 Effacement (%): 80 Station: 0 Exam by:: [email or web address]  Labs: Lab Results  Component Value Date   WBC 7.7 06/23/2020   HGB 7.8 (L) 06/23/2020   HCT 24.7 (L) 06/23/2020   MCV 70.6 (L) 06/23/2020   PLT 320 06/23/2020    Assessment / Plan: Induction of labor due to hx of bilateral PE,  progressing well on pitocin. AROM complete. Clear fluid.  Labor: Progressing normally on pitocin Fetal Wellbeing:  Category I Pain Control:  Epidural I/D:  GBS negative Anticipated MOD:  NSVD  Dr 06/25/2020 notified of pt status and POC   Tasha Sloop Ashlin Kreps MSN, CNM 06/23/2020, 8:08 PM

## 2020-06-23 NOTE — Lactation Note (Signed)
This note was copied from a baby's chart. Lactation Consultation Note  Patient Name: Tasha Wu [id number] Date: 06/23/2020 Reason for consult: L&D Initial assessment;Term Age:31 hours P4, term female infant. LC entered the room, infant was being weighed. Mom latched infant on her right breast using the football hold position, infant latched with depth and swallows were observed.  Mom was still breastfeeding infant after 12 minutes when LC left the room. Mom knows to breastfeed infant according to primal cues: licking, tasting, smacking, hands and fits in mouth and rooting. Mom knows to ask RN or LC on MBU for assistance with latching infant at the breast if needed. LC discussed infant's input and output with parents.  Maternal Data Has patient been taught Hand Expression?: Yes Does the patient have breastfeeding experience prior to this delivery?: Yes How long did the patient breastfeed?: Per mom, she only pumped with her other 3 children for 2 months  Feeding Mother's Current Feeding Choice: Breast Milk and Formula  LATCH Score Latch: Grasps breast easily, tongue down, lips flanged, rhythmical sucking.  Audible Swallowing: Spontaneous and intermittent  Type of Nipple: Everted at rest and after stimulation  Comfort (Breast/Nipple): Soft / non-tender  Hold (Positioning): Assistance needed to correctly position infant at breast and maintain latch.  LATCH Score: 9   Lactation Tools Discussed/Used    Interventions Interventions: Breast feeding basics reviewed;Assisted with latch;Skin to skin;Breast massage;Breast compression;Adjust position;Support pillows;Position options;Education  Discharge WIC Program: No  Consult Status Consult Status: Follow-up Date: 06/24/20 Follow-up type: In-patient    [name] 06/23/2020, 10:39 PM

## 2020-06-23 NOTE — H&P (Signed)
OB ADMISSION/ HISTORY & PHYSICAL:  Admission Date: 06/23/2020 12:24 PM  Admit Diagnosis: IOL for hx PE  Tasha Wu is a 30 y.o. female [id number] [redacted]w[redacted]d presenting for IOL for HX of PE. Endorses active FM, denies LOF and vaginal bleeding. Occasional ctx.  Hx of bilateral PE on 12/04/19. Followed by [facility].  Maternal Echo 02/11/21, improved from previous echo. Repeat PP. Lovenox d/c 06/20/20, resume PP x 6 weeks per pulmonology,  Dr Celine Mans.   History of current pregnancy: [id number]   Primary OB Provider: [facility] Patient entered care with [facility] at 12.4 wks.   EDC 06/30/20 by LMP 09/24/19 and congruent w/ 12.3 wk U/S.   Anatomy scan:  20.5 wks, complete w/ anterior placenta.   Antenatal testing: for Hx of PE on Lovenox started at 28 weeks Last evaluation: 06/22/20  wks  Significant prenatal events:  Patient Active Problem List   Diagnosis Date Noted  . Encounter for induction of labor 06/23/2020  . Hypokalemia   . Bilateral pulmonary embolism (HCC) 12/04/2019  . High-risk pregnancy in first trimester   . SVD (spontaneous vaginal delivery) 02/21/2018  . Anemia 12/21/2017  . Pregnant 07/24/2017  . Sickle cell trait (HCC) 07/21/2017  . Anxiety 10/03/2016  . Gastroesophageal reflux disease 10/03/2016  . Normal labor 12/28/2013  . Post term pregnancy, antepartum condition or complication 06/18/2012  . Abnormal Pap smear 12/10/2011  . Umbilical hernia 11/29/2011  . H/O pyelonephritis 11/29/2011    Prenatal Labs: ABO, Rh: O Positive Antibody: Negative Rubella: Immune (10/19 0000)  RPR: Nonreactive (10/19 0000)  HBsAg: Negative (10/19 0000)  HIV: Non-reactive (10/19 0000)  GTT: 96 GBS:   Negative GC/CHL: negative/negative Genetics: Low risk Panorama, Horizon negative x 4 Tdap: UTD   OB History  Gravida Para Term Preterm AB Living  5 3 3   1 3   SAB IAB Ectopic Multiple Live Births        0 3    # Outcome Date GA Lbr Len/2nd Weight Sex Delivery Anes PTL Lv  5  Current           4 Term 02/21/18 [redacted]w[redacted]d 29:29 3430 g M Vag-Spont EPI  LIV  3 Term 12/28/13 [redacted]w[redacted]d 04:50 / 00:14 3800 g F Vag-Spont EPI  LIV  2 Term 06/18/12 [redacted]w[redacted]d 07:42 / 00:52 3005 g F Vag-Spont EPI  LIV  1 AB 2009 [redacted]w[redacted]d            Birth Comments: No complications    Medical / Surgical History: Past medical history:  Past Medical History:  Diagnosis Date  . Abnormal Pap smear 12/10/2011  . Anemia   . Anxiety 2010   anxiety attack;no meds;has not been dx'd  . Emotional instability (HCC)    Break down easily when things are not going her way  . GERD (gastroesophageal reflux disease)   . History of pulmonary embolus during pregnancy   . Hx of joint problems    Shoulders and knees have pain  . Infection    Yeast;would get frequently  . Infection    BV x 1  . Kidney infection 2010   PO antibxs  . Kidney stone complicating pregnancy   . Migraines    no rx'd meds in the past;goes to sleep  . PCOS (polycystic ovarian syndrome)   . Sickle cell trait (HCC)   . Sickle cell trait (HCC)   . Umbilical hernia 1991   Does not cause anyp problems  . Vaginal Pap smear, abnormal  Past surgical history:  Past Surgical History:  Procedure Laterality Date  . DILATION AND CURETTAGE OF UTERUS  2009   No complications  . IR ANGIOGRAM SELECTIVE EACH ADDITIONAL VESSEL  12/05/2019  . IR ANGIOGRAM SELECTIVE EACH ADDITIONAL VESSEL  12/05/2019  . IR INFUSION THROMBOL ARTERIAL INITIAL (MS)  12/05/2019  . IR THROMB F/U EVAL ART/VEN FINAL DAY (MS)  12/06/2019  . IR US GUIDE VASC ACCESS LEFT  12/05/2019  . IR US GUIDE VASC ACCESS LEFT  12/05/2019  . WISDOM TOOTH EXTRACTION     Family History:  Family History  Problem Relation Age of Onset  . Sickle cell trait Mother   . Hypertension Mother   . Anemia Mother   . Sickle cell trait Brother   . Sickle cell anemia Maternal Aunt        Recently deceased from [place] crisis  . Diabetes Maternal Aunt   . Sickle cell anemia Maternal Grandmother         Deceased  . Heart disease Maternal Aunt   . Asthma Sister        1/2 sibling;father's child  . Hyperthyroidism Paternal Grandmother   . Scoliosis Paternal Grandmother   . Arthritis Paternal Grandmother   . Migraines Father   . Other Father        Joint Problems;shoulders and knees  . Hernia Maternal Aunt        Umbilical  . Hypertension Maternal Uncle   . Diabetes type I Cousin        maternal  . Diabetes Paternal Grandfather     Social History:  reports that she quit smoking about 8 years ago. Her smoking use included cigarettes. She has a 1.25 pack-year smoking history. She has never used smokeless tobacco. She reports previous alcohol use. She reports that she does not use drugs.  Allergies: Patient has no known allergies.   Current Medications at time of admission:  Prior to Admission medications   Medication Sig Start Date End Date Taking? Authorizing Provider  albuterol (VENTOLIN HFA) 108 (90 Base) MCG/ACT inhaler Inhale 2 puffs into the lungs every 6 (six) hours as needed for wheezing or shortness of breath. 12/24/18   Jannifer Rodney A, FNP  enoxaparin (LOVENOX) 80 MG/0.8ML injection Inject 0.7 mLs (70 mg total) into the skin every 12 (twelve) hours. 01/27/20 04/26/20  Janeece Agee, NP  Prenatal Vit-Fe Fumarate-FA (MULTIVITAMIN-PRENATAL) 27-0.8 MG TABS tablet Take 1 tablet by mouth daily at 12 noon.    [provider]    Review of Systems: Constitutional: Negative   HENT: Negative   Eyes: Negative   Respiratory: Negative   Cardiovascular: Negative   Gastrointestinal: Negative  Genitourinary: positive brown spotting for bloody show, negative for LOF   Musculoskeletal: Negative   Skin: Negative   Neurological: Negative   Endo/Heme/Allergies: Negative   Psychiatric/Behavioral: Negative    Physical Exam: VS: Last menstrual period 09/24/2019, unknown if currently breastfeeding. AAO x3, no signs of distress Cardiovascular: RRR Respiratory: Lung fields  clear to ausculation GU/GI: Abdomen gravid, non-tender, non-distended, active FM, vertex, EFW 6-7 per Leopold's Extremities: negative edema, negative for pain, tenderness, and cords  Cervical exam:Dilation: 1.5 Effacement (%): Thick Exam by:: Kim Nitish Roes FHR: baseline rate 130 / variability moderate / accelerations present / absent decelerations TOCO: occasional   Prenatal Transfer Tool  Maternal Diabetes: No Genetic Screening: Normal Maternal Ultrasounds/Referrals: Normal Fetal Ultrasounds or other Referrals:  Normal Maternal Substance Abuse:  No Significant Maternal Medications:  Meds include: Other: Lovenox d/c  06/20/20 Significant Maternal Lab Results: Group B Strep negative    Assessment: 30 y.o. [id number] [redacted]w[redacted]d IOL for Hx of PE, was on lovenox. Lovenox d/c 06/20/20.   FHR category 1 GBS negative Pain management plan: Epidural PRN   Plan:  Admit to L&D Routine admission orders Cytotec x 1  Epidural PRN SCDs on  Dr Su Hilt notified of admission and plan of care  Carollee Leitz MSN, CNM 06/23/2020 2:04 PM

## 2020-06-23 NOTE — Anesthesia Preprocedure Evaluation (Signed)
Anesthesia Evaluation  Patient identified by MRN, date of birth, ID band Patient awake    Reviewed: Allergy & Precautions, H&P , NPO status , Patient's Chart, lab work & pertinent test results  Airway Mallampati: II   Neck ROM: full    Dental   Pulmonary former smoker,    breath sounds clear to auscultation       Cardiovascular negative cardio ROS   Rhythm:regular Rate:Normal     Neuro/Psych  Headaches, PSYCHIATRIC DISORDERS Anxiety    GI/Hepatic GERD  ,  Endo/Other    Renal/GU      Musculoskeletal   Abdominal   Peds  Hematology  (+) Blood dyscrasia, Sickle cell trait and anemia ,   Anesthesia Other Findings   Reproductive/Obstetrics (+) Pregnancy                             Anesthesia Physical  Anesthesia Plan  ASA: II  Anesthesia Plan: Epidural   Post-op Pain Management:    Induction: Intravenous  PONV Risk Score and Plan: 2 and Treatment may vary due to age or medical condition  Airway Management Planned: Natural Airway  Additional Equipment:   Intra-op Plan:   Post-operative Plan:   Informed Consent: I have reviewed the patients History and Physical, chart, labs and discussed the procedure including the risks, benefits and alternatives for the proposed anesthesia with the patient or authorized representative who has indicated his/her understanding and acceptance.       Plan Discussed with: Anesthesiologist  Anesthesia Plan Comments:         Anesthesia Quick Evaluation

## 2020-06-23 NOTE — Anesthesia Procedure Notes (Signed)
Epidural Patient location during procedure: OB Start time: 06/23/2020 4:44 PM End time: 06/23/2020 4:55 PM  Staffing Anesthesiologist: Lowella Curb, MD Performed: other anesthesia staff   Preanesthetic Checklist Completed: patient identified, IV checked, site marked, risks and benefits discussed, surgical consent, monitors and equipment checked, pre-op evaluation and timeout performed  Epidural Patient position: sitting Prep: ChloraPrep Patient monitoring: heart rate, cardiac monitor, continuous pulse ox and blood pressure Approach: midline Injection technique: LOR air  Needle:  Needle type: Tuohy  Needle gauge: 17 G Needle length: 9 cm Needle insertion depth: 6 cm Catheter type: closed end flexible Catheter size: 20 Guage Catheter at skin depth: 10 cm Test dose: negative  Assessment Events: blood not aspirated, injection not painful, no injection resistance, no paresthesia and negative IV test  Additional Notes Epidural placed by SRNA under direct supervisionReason for block:procedure for pain

## 2020-06-24 LAB — CBC
HCT: 24.4 % — ABNORMAL LOW (ref 36.0–46.0)
Hemoglobin: 7.7 g/dL — ABNORMAL LOW (ref 12.0–15.0)
MCH: 22.6 pg — ABNORMAL LOW (ref 26.0–34.0)
MCHC: 31.6 g/dL (ref 30.0–36.0)
MCV: 71.8 fL — ABNORMAL LOW (ref 80.0–100.0)
Platelets: 277 10*3/uL (ref 150–400)
RBC: 3.4 MIL/uL — ABNORMAL LOW (ref 3.87–5.11)
RDW: 14.6 % (ref 11.5–15.5)
WBC: 7.7 10*3/uL (ref 4.0–10.5)
nRBC: 0 % (ref 0.0–0.2)

## 2020-06-24 LAB — RPR: RPR Ser Ql: NONREACTIVE

## 2020-06-24 NOTE — Anesthesia Postprocedure Evaluation (Signed)
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  Last Vitals:  Vitals:   06/24/20 0407 06/24/20 0604  BP: 111/70 107/77  Pulse: 80 73  Resp:    Temp: 36.6 C 36.5 C  SpO2: 100% 100%    Last Pain:  Vitals:   06/24/20 0604  TempSrc: Oral  PainSc:    Pain Goal: Patients Stated Pain Goal: 2 (06/24/20 0248)                 Karleen Dolphin

## 2020-06-24 NOTE — Progress Notes (Signed)
MOB was referred for history of depression/anxiety. * Referral screened out by Clinical Social Worker because none of the following criteria appear to apply:  ~ History of anxiety/depression during this pregnancy, or of post-partum depression following prior delivery. ~ Diagnosis of anxiety and/or depression within last 3 years OR * MOB's symptoms currently being treated with medication and/or therapy.  Please contact the Clinical Social Worker if needs arise, by MOB request, or if MOB scores greater than 9/yes to question 10 on Edinburgh Postpartum Depression Screen.   Salem Lembke, MSW, LCSW-A Clinical Social Worker [phone]  

## 2020-06-24 NOTE — Progress Notes (Signed)
PPD# 1 SVD w/ intact perineum Information for the patient's newborn:  Audreyanna, Butkiewicz [[id number]]  female    Baby Name Na'im Circumcision plans in-patient circ   S:   Reports feeling good Tolerating PO fluid and solids No nausea or vomiting Bleeding is light, 2 small clots this Am, none since Pain controlled with PO meds Up ad lib / ambulatory / voiding w/o difficulty Feeding: Bottle    O:   VS: BP 107/77 (BP Location: Right Arm)   Pulse 73   Temp 97.7 F (36.5 C) (Oral)   Resp 15   Ht 5\' 7"  (1.702 m)   Wt 74.6 kg   LMP 09/24/2019   SpO2 100%   Breastfeeding Unknown   BMI 25.75 kg/m   LABS:  Recent Labs    06/23/20 1322 06/24/20 0539  WBC 7.7 7.7  HGB 7.8* 7.7*  PLT 320 277   Blood type: --/--/O POS (04/21 1300) Rubella: Immune (10/19 0000)                      I&O: Intake/Output      04/21 0701 04/22 0700 04/22 0701 04/23 0700   P.O. 350    I.V. (mL/kg) 1327.5 (17.8)    Other 53.2    Total Intake(mL/kg) 1730.7 (23.2)    Urine (mL/kg/hr) 600    Blood 100    Total Output 700    Net +1030.7         Breastfed 1 x      Physical Exam: Alert and oriented X3 Lungs: Clear and unlabored Heart: regular rate and rhythm / no mumurs Abdomen: soft, non-tender, non-distended  Fundus: firm, non-tender Perineum: non-edematous Lochia: appropriate Extremities: no edema, no calf pain or tenderness    A:  PPD # 1  Normal exam  P:  Routine post partum orders Anticipate D/C on 06/25/20  Plan reviewed w/ Dr. 06/27/20, MSN, CNM 06/24/2020, 9:48 AM

## 2020-06-25 DIAGNOSIS — O99013 Anemia complicating pregnancy, third trimester: Secondary | ICD-10-CM | POA: Diagnosis present

## 2020-06-25 MED ORDER — MAGNESIUM OXIDE 400 (241.3 MG) MG PO TABS
400.0000 mg | ORAL_TABLET | Freq: Every day | ORAL | Status: DC
  Administered 2020-06-25: 400 mg via ORAL
  Filled 2020-06-25: qty 1

## 2020-06-25 MED ORDER — POLYSACCHARIDE IRON COMPLEX 150 MG PO CAPS: 150.0000 mg | ORAL_CAPSULE | Freq: Every day | ORAL | 1 refills | Status: DC

## 2020-06-25 MED ORDER — IBUPROFEN 600 MG PO TABS: 600.0000 mg | ORAL_TABLET | Freq: Four times a day (QID) | ORAL | 0 refills | Status: DC

## 2020-06-25 MED ORDER — SERTRALINE HCL 25 MG PO TABS: 25.0000 mg | ORAL_TABLET | Freq: Every day | ORAL | 2 refills | Status: DC

## 2020-06-25 MED ORDER — POLYSACCHARIDE IRON COMPLEX 150 MG PO CAPS
150.0000 mg | ORAL_CAPSULE | Freq: Every day | ORAL | Status: DC
  Administered 2020-06-25: 150 mg via ORAL
  Filled 2020-06-25: qty 1

## 2020-06-25 NOTE — Progress Notes (Addendum)
CSW received consult due to MOB scoring 10 on Edinburgh Depression Scale. CSW observed MOB resting in bed while infant was laying on FOB's chest on couch. CSW explained role and reason for consult. MOB was pleasant, polite and engaged with CSW.   MOB reported, she is currently fine and that she was experiencing anxiety during pregnancy due to health concerns. CSW asked MOB if she was interested in counseling resources. MOB agreed and CSW provided MOB with counseling resources. MOB reported, FOB as her support. MOB denied SI, HI and DV when CSW assessed for safety. MOB denied any additional barriers.  MOB reported, she received WIC/FS. CSW informed MOB about adding infant to WIS/FS.   MOB reported, infant's pediatrician will be at [facility] and there are no barriers to follow up care. MOB reported, she has all essentials needed to care for infant. MOB reported, infant has a used car seat and bassinet.     CSW provided education regarding the baby blues period vs. perinatal mood disorders, discussed treatment and gave resources for mental health follow up if concerns arise. CSW recommends self- evaluation during the postpartum time period using the New Mom Checklist from Postpartum Progress and encouraged MOB to contact a medical professional if symptoms are noted at any time.     CSW provided education on Sudden Infant Death Syndrome (SIDS).    CSW identifies no further need for intervention or barriers to discharge at this time.  Eriko Economos, MSW, LCSW-A Clinical Social Worker [phone] 

## 2020-06-25 NOTE — Plan of Care (Signed)
Discharge instructions given, pt receptive.  

## 2020-06-25 NOTE — Discharge Summary (Signed)
SVD OB Discharge Summary     Patient Name: Tasha Wu DOB: 01/02/1991 MRN: [id number]  Date of admission: 06/23/2020 Delivering MD: Johney Frame B  Date of delivery: 06/23/2020 Type of delivery: SVD  Newborn Data: Sex: Baby Female Circumcision: in pt done today Live born female  Birth Weight: 6 lb 14.1 oz (3120 g) APGAR: 9, 9  Newborn Delivery   Birth date/time: 06/23/2020 21:11:00 Delivery type: Vaginal, Spontaneous      Feeding: breast and bottle Infant being discharge to home with mother in stable condition.   Admitting diagnosis: Encounter for induction of labor [Z34.90] Intrauterine pregnancy: [redacted]w[redacted]d     Secondary diagnosis:  Active Problems:   Encounter for induction of labor   SVD (spontaneous vaginal delivery)   Normal postpartum course   Anemia affecting pregnancy in third trimester                                Complications: None                                                              Intrapartum Procedures: spontaneous vaginal delivery Postpartum Procedures: none Complications-Operative and Postpartum: none Augmentation: AROM, Pitocin and Cytotec   History of Present Illness: [name] is a 30 y.o. female, [id number], who presents at [redacted]w[redacted]d weeks gestation. The patient has been followed at  [facility]  Her pregnancy has been complicated by:  Patient Active Problem List   Diagnosis Date Noted  . SVD (spontaneous vaginal delivery) 06/25/2020  . Normal postpartum course 06/25/2020  . Anemia affecting pregnancy in third trimester 06/25/2020  . Encounter for induction of labor 06/23/2020  . Bilateral pulmonary embolism (HCC) 12/04/2019  . High-risk pregnancy in first trimester   . Anemia 12/21/2017  . Sickle cell trait (HCC) 07/21/2017  . Anxiety 10/03/2016  . Gastroesophageal reflux disease 10/03/2016  . Abnormal Pap smear 12/10/2011  . Umbilical hernia 11/29/2011  . H/O pyelonephritis  11/29/2011     Active Ambulatory Problems    Diagnosis Date Noted  . Umbilical hernia 11/29/2011  . H/O pyelonephritis 11/29/2011  . Abnormal Pap smear 12/10/2011  . Anemia 12/21/2017  . Anxiety 10/03/2016  . Gastroesophageal reflux disease 10/03/2016  . Sickle cell trait (HCC) 07/21/2017  . Bilateral pulmonary embolism (HCC) 12/04/2019  . High-risk pregnancy in first trimester    Resolved Ambulatory Problems    Diagnosis Date Noted  . Pregnancy 04/21/2012  . Post term pregnancy, antepartum condition or complication 06/18/2012  . Normal labor 12/28/2013  . SVD (spontaneous vaginal delivery) 02/21/2018  . Pregnant 07/24/2017  . Hypokalemia    Past Medical History:  Diagnosis Date  . Emotional instability (HCC)   . GERD (gastroesophageal reflux disease)   . History of pulmonary embolus during pregnancy   . Hx of joint problems   . Infection   . Infection   . Kidney infection 2010  . Kidney stone complicating pregnancy   . Migraines   . PCOS (polycystic ovarian syndrome)   . Vaginal Pap smear, abnormal      Hospital course:  Induction of Labor With Vaginal Delivery   30 y.o. yo [id number] at [redacted]w[redacted]d was admitted to the hospital 06/23/2020 for  induction of labor.  Indication for induction: d/t h/o PE on lovenox. .  Patient had an uncomplicated labor course as follows: Membrane Rupture Time/Date: 8:00 PM ,06/23/2020   Delivery Method:Vaginal, Spontaneous  Episiotomy: None  Lacerations:    Details of delivery can be found in separate delivery note.  Patient had a routine postpartum course. Patient is discharged home 06/25/20.  Newborn Data: Birth date:06/23/2020  Birth time:9:11 PM  Gender:Female  Living status:Living  Apgars:9 ,9  Weight:3120 g  Postpartum Day # 2 : S/P NSVD due to admitted on 06/23/2020 IOL for h/o PE on lovenox during pregnancy, last took 4/18, restarted PP and will continue at home until 6 weeks PP. Pt progressed with cytotec, AROM, pitocin then had SVD on  06/23/2020, over intact premium, , ebl was 100, hgb drop of 7.8-7.7, +fatigue, on PO iron, otherwise stable. Pt verbalized having the med . Patient up ad lib, denies syncope or dizziness. Reports consuming regular diet without issues and denies N/V. Patient reports 0 bowel movement + passing flatus.  Denies issues with urination and reports bleeding is "lighter."  Patient is breast and bottlefeeding and reports going well.  Desires vasectomy for postpartum contraception.  Pain is being appropriately managed with use of po meds. Pt has h/o anxiety, mood stable, discussed starting zoloft 25mg  PO daily and in one week increase to 50mg , may continue to max of 100mg  PO daily, f/u with further instructions.   Physical exam  Vitals:   06/24/20 0604 06/24/20 1155 06/24/20 2155 06/25/20 0612  BP: 107/77 128/83 103/75 102/65  Pulse: 73 73 76 66  Resp:  18 16 16   Temp: 97.7 F (36.5 C) 98 F (36.7 C) 97.7 F (36.5 C) (!) 97.4 F (36.3 C)  TempSrc: Oral Oral Oral Oral  SpO2: 100%     Weight:      Height:       General: alert, cooperative and no distress Lochia: appropriate Uterine Fundus: firm Perineum: Intact DVT Evaluation: No evidence of DVT seen on physical exam. Negative Homan's sign. No cords or calf tenderness. No significant calf/ankle edema.  Labs: Lab Results  Component Value Date   WBC 7.7 06/24/2020   HGB 7.7 (L) 06/24/2020   HCT 24.4 (L) 06/24/2020   MCV 71.8 (L) 06/24/2020   PLT 277 06/24/2020   CMP Latest Ref Rng & Units 12/15/2019  Glucose 65 - 99 mg/dL 66  BUN 6 - 20 mg/dL 11  Creatinine 06/26/2020 - 06/26/2020 mg/dL 06/26/2020  Sodium 02/14/2020 - 9.62 mmol/L 135  Potassium 3.5 - 5.2 mmol/L 4.6  Chloride 96 - 106 mmol/L 101  CO2 20 - 29 mmol/L 18(L)  Calcium 8.7 - 10.2 mg/dL 9.6  Total Protein 6.0 - 8.5 g/dL 7.2  Total Bilirubin 0.0 - 1.2 mg/dL 0.3  Alkaline Phos 44 - 121 IU/L 53  AST 0 - 40 IU/L 16  ALT 0 - 32 IU/L 9    Date of discharge: 06/25/2020 Discharge Diagnoses: Term  Pregnancy-delivered Discharge instruction: per After Visit Summary and "Baby and Me Booklet".  After visit meds:   Activity:           unrestricted and pelvic rest Advance as tolerated. Pelvic rest for 6 weeks.  Diet:                routine Medications: PNV, Ibuprofen, Colace and Iron Postpartum contraception: Vasectomy Condition:  Pt discharge to home with baby in stable and condition Anemia: PO Iron.  H/O Anxiety: Start zoloft 25mg  PO  daily, 2 week Mood check with [facility].   Meds: Allergies as of 06/25/2020   No Known Allergies     Medication List    TAKE these medications   albuterol 108 (90 Base) MCG/ACT inhaler Commonly known as: VENTOLIN HFA Inhale 2 puffs into the lungs every 6 (six) hours as needed for wheezing or shortness of breath.   enoxaparin 80 MG/0.8ML injection Commonly known as: LOVENOX Inject 0.7 mLs (70 mg total) into the skin every 12 (twelve) hours.   ibuprofen 600 MG tablet Commonly known as: ADVIL Take 1 tablet (600 mg total) by mouth every 6 (six) hours.   iron polysaccharides 150 MG capsule Commonly known as: NIFEREX Take 1 capsule (150 mg total) by mouth daily.   multivitamin-prenatal 27-0.8 MG Tabs tablet Take 1 tablet by mouth daily at 12 noon.   sertraline 25 MG tablet Commonly known as: Zoloft Take 1 tablet (25 mg total) by mouth daily.       Discharge Follow Up:   Follow-up Information    [facility] Follow up.   Specialty: Obstetrics and Gynecology Why: 2 week mood check, 6 weeks PPV Contact information: [address] [place] [address] [phone]               [place], NP-C, CNM 06/25/2020, 11:48 AM  [place], FNP

## 2020-08-08 NOTE — Telephone Encounter (Signed)
This message was received this morning for you.   I am now 6 weeks post partum and wanted to follow up with you to see how much longer do you feel is medically necessary to continue the Lovenox injections and do I need to have another ECG performed?     Message routed to Dr. Celine Mans to advise.

## 2020-09-11 DIAGNOSIS — M5116 Intervertebral disc disorders with radiculopathy, lumbar region: Secondary | ICD-10-CM | POA: Insufficient documentation

## 2020-09-11 DIAGNOSIS — G629 Polyneuropathy, unspecified: Secondary | ICD-10-CM | POA: Insufficient documentation

## 2020-09-15 ENCOUNTER — Ambulatory Visit
Admission: EM | Admit: 2020-09-15 | Discharge: 2020-09-15 | Disposition: A | Discharge status: Home / Self Care | Payer: POS | Attending: Emergency Medicine | Admitting: Emergency Medicine

## 2020-09-15 ENCOUNTER — Other Ambulatory Visit: Payer: Self-pay

## 2020-09-15 ENCOUNTER — Encounter: Payer: Self-pay | Admitting: Emergency Medicine

## 2020-09-15 ENCOUNTER — Ambulatory Visit: Admit: 2020-09-15 | Disposition: A | Discharge status: Home / Self Care | Payer: POS

## 2020-09-15 DIAGNOSIS — S83422A Sprain of lateral collateral ligament of left knee, initial encounter: Secondary | ICD-10-CM

## 2020-09-15 DIAGNOSIS — M7632 Iliotibial band syndrome, left leg: Secondary | ICD-10-CM | POA: Diagnosis not present

## 2020-09-15 MED ORDER — NAPROXEN 500 MG PO TABS: 500.0000 mg | ORAL_TABLET | Freq: Two times a day (BID) | ORAL | 0 refills | Status: DC

## 2020-09-15 NOTE — Discharge Instructions (Addendum)
Please wear knee brace while working Naprosyn twice daily up with pain/swelling Ice and elevate knee Gentle stretching or may try foam rolling Negative x-ray-I will only call if abnormal Please return if not improving or worsening

## 2020-09-15 NOTE — ED Triage Notes (Signed)
Patient presents to [facility] for evaluation of left knee pain since Monday radiating to left hip.  PAtient denies known injury, states she is having idfficulty bearing wegiht.  Has been taking Bayer Aspirin without much relief.  Tried aspacream yesterday without relief.

## 2020-09-15 NOTE — ED Provider Notes (Signed)
[facility]    CSN: [id number] Arrival date & time: 09/15/20  1020      History   Chief Complaint Chief Complaint  Patient presents with   Leg Pain    HPI Tasha Wu is a 30 y.o. female presenting today for evaluation of knee pain.  Reports mild knee pain on Monday, but worsened overnight into Tuesday and has persisted over the past 3 days.  She denies any specific injury fall or trauma.  Denies any planting and shifting mechanism of injury.  Denies any sensation of popping tearing catching or instability.  Reports pain mainly to the lateral aspect of her left knee and will radiate into anterior thigh.  Denies history of prior knee problems.  Using aspirin and Aspercreme without relief.  Denies any pain in lower leg or calf.  Has had prior PE and recently stopped taking blood thinners.  She reports that she recently returned to work 3 weeks ago and is doing much increased walking and standing throughout the day compared to normal.  HPI  Past Medical History:  Diagnosis Date   Abnormal Pap smear 12/10/2011   Anemia    Anxiety 2010   anxiety attack;no meds;has not been dx'd   Emotional instability (HCC)    Break down easily when things are not going her way   GERD (gastroesophageal reflux disease)    History of pulmonary embolus during pregnancy    Hx of joint problems    Shoulders and knees have pain   Infection    Yeast;would get frequently   Infection    BV x 1   Kidney infection 2010   PO antibxs   Kidney stone complicating pregnancy    Migraines    no rx'd meds in the past;goes to sleep   PCOS (polycystic ovarian syndrome)    Sickle cell trait (HCC)    Sickle cell trait (HCC)    Umbilical hernia 1991   Does not cause anyp problems   Vaginal Pap smear, abnormal     Patient Active Problem List   Diagnosis Date Noted   SVD (spontaneous vaginal delivery) 06/25/2020   Normal postpartum course 06/25/2020   Anemia affecting pregnancy in third trimester  06/25/2020   Encounter for induction of labor 06/23/2020   Bilateral pulmonary embolism (HCC) 12/04/2019   High-risk pregnancy in first trimester    Anemia 12/21/2017   Sickle cell trait (HCC) 07/21/2017   Anxiety 10/03/2016   Gastroesophageal reflux disease 10/03/2016   Abnormal Pap smear 12/10/2011   Umbilical hernia 11/29/2011   H/O pyelonephritis 11/29/2011    Past Surgical History:  Procedure Laterality Date   DILATION AND CURETTAGE OF UTERUS  2009   No complications   IR ANGIOGRAM SELECTIVE EACH ADDITIONAL VESSEL  12/05/2019   IR ANGIOGRAM SELECTIVE EACH ADDITIONAL VESSEL  12/05/2019   IR INFUSION THROMBOL ARTERIAL INITIAL (MS)  12/05/2019   IR THROMB F/U EVAL ART/VEN FINAL DAY (MS)  12/06/2019   IR US GUIDE VASC ACCESS LEFT  12/05/2019   IR US GUIDE VASC ACCESS LEFT  12/05/2019   WISDOM TOOTH EXTRACTION      OB History     Gravida  5   Para  4   Term  4   Preterm      AB  1   Living  4      SAB      IAB      Ectopic      Multiple  0   Live  Births  4            Home Medications    Prior to Admission medications   Medication Sig Start Date End Date Taking? Authorizing Provider  naproxen (NAPROSYN) 500 MG tablet Take 1 tablet (500 mg total) by mouth 2 (two) times daily. 09/15/20  Yes Cyndy Braver C, PA-C  albuterol (VENTOLIN HFA) 108 (90 Base) MCG/ACT inhaler Inhale 2 puffs into the lungs every 6 (six) hours as needed for wheezing or shortness of breath. 12/24/18   Jannifer Rodney A, FNP  enoxaparin (LOVENOX) 80 MG/0.8ML injection Inject 0.7 mLs (70 mg total) into the skin every 12 (twelve) hours. 01/27/20 04/26/20  Janeece Agee, NP  iron polysaccharides (NIFEREX) 150 MG capsule Take 1 capsule (150 mg total) by mouth daily. 06/25/20   Dale [place], FNP  Prenatal Vit-Fe Fumarate-FA (MULTIVITAMIN-PRENATAL) 27-0.8 MG TABS tablet Take 1 tablet by mouth daily at 12 noon.    [provider]  sertraline (ZOLOFT) 25 MG tablet Take 1 tablet (25 mg  total) by mouth daily. 06/25/20 06/25/21  Dale [place], FNP    Family History Family History  Problem Relation Age of Onset   Sickle cell trait Mother    Hypertension Mother    Anemia Mother    Sickle cell trait Brother    Sickle cell anemia Maternal Aunt        Recently deceased from [facility] crisis   Diabetes Maternal Aunt    Sickle cell anemia Maternal Grandmother        Deceased   Heart disease Maternal Aunt    Asthma Sister        1/2 sibling;father's child   Hyperthyroidism Paternal Grandmother    Scoliosis Paternal Grandmother    Arthritis Paternal Grandmother    Migraines Father    Other Father        Joint Problems;shoulders and knees   Hernia Maternal Aunt        Umbilical   Hypertension Maternal Uncle    Diabetes type I Cousin        maternal   Diabetes Paternal Grandfather     Social History Social History   Tobacco Use   Smoking status: Former    Packs/day: 0.25    Years: 5.00    Pack years: 1.25    Types: Cigarettes    Quit date: 11/18/2011    Years since quitting: 8.8   Smokeless tobacco: Never  Vaping Use   Vaping Use: Never used  Substance Use Topics   Alcohol use: Not Currently    Comment: occ   Drug use: No     Allergies   Patient has no known allergies.   Review of Systems Review of Systems  Constitutional:  Negative for fatigue and fever.  Eyes:  Negative for visual disturbance.  Respiratory:  Negative for shortness of breath.   Cardiovascular:  Negative for chest pain.  Gastrointestinal:  Negative for abdominal pain, nausea and vomiting.  Musculoskeletal:  Positive for arthralgias. Negative for joint swelling.  Skin:  Negative for color change, rash and wound.  Neurological:  Negative for dizziness, weakness, light-headedness and headaches.    Physical Exam Triage Vital Signs ED Triage Vitals  Enc Vitals Group     BP      Pulse      Resp      Temp      Temp src      SpO2      Weight      Height  Head Circumference       Peak Flow      Pain Score      Pain Loc      Pain Edu?      Excl. in GC?    No data found.  Updated Vital Signs BP 137/68 (BP Location: Right Arm)   Pulse 67   Temp 98.2 F (36.8 C) (Oral)   Resp 18   LMP 09/06/2020   SpO2 99%   Visual Acuity Right Eye Distance:   Left Eye Distance:   Bilateral Distance:    Right Eye Near:   Left Eye Near:    Bilateral Near:     Physical Exam Vitals and nursing note reviewed.  Constitutional:      Appearance: She is well-developed.     Comments: No acute distress  HENT:     Head: Normocephalic and atraumatic.     Nose: Nose normal.  Eyes:     Conjunctiva/sclera: Conjunctivae normal.  Cardiovascular:     Rate and Rhythm: Normal rate.  Pulmonary:     Effort: Pulmonary effort is normal. No respiratory distress.  Abdominal:     General: There is no distension.  Musculoskeletal:        General: Normal range of motion.     Cervical back: Neck supple.     Comments: Left knee: No obvious swelling deformity or discoloration, nontender to palpation over patella, tenderness extending laterally along length of LCL and into lateral femoral epicondyle area extending into anterior quadricep, nontender to medial joint line or along infrapatellar area, no laxity appreciated with varus and valgus stress, negative Lachman's, negative McMurray's, weakly positive Noble's, negative Ober's  Skin:    General: Skin is warm and dry.  Neurological:     Mental Status: She is alert and oriented to person, place, and time.     UC Treatments / Results  Labs (all labs ordered are listed, but only abnormal results are displayed) Labs Reviewed - No data to display  EKG   Radiology No results found.  Procedures Procedures (including critical care time)  Medications Ordered in UC Medications - No data to display  Initial Impression / Assessment and Plan / UC Course  I have reviewed the triage vital signs and the nursing notes.  Pertinent labs &  imaging results that were available during my care of the patient were reviewed by me and considered in my medical decision making (see chart for details).     LCL sprain versus IT band tightness-providing knee brace for work, recommending ice elevation resting and gentle stretching of IT band/quad.  Naprosyn for pain and swelling.  Discussed strict return precautions. Patient verbalized understanding and is agreeable with plan.  Final Clinical Impressions(s) / UC Diagnoses   Final diagnoses:  Sprain of lateral collateral ligament of left knee, initial encounter  It band syndrome, left     Discharge Instructions      Please wear knee brace while working Naprosyn twice daily up with pain/swelling Ice and elevate knee Gentle stretching or may try foam rolling Negative x-ray-I will only call if abnormal Please return if not improving or worsening     ED Prescriptions     Medication Sig Dispense Auth. Provider   naproxen (NAPROSYN) 500 MG tablet Take 1 tablet (500 mg total) by mouth 2 (two) times daily. 30 tablet Kaleesi Guyton, [place] C, PA-C      PDMP not reviewed this encounter.   Caston Coopersmith, [place] C, PA-C 09/15/20 1120

## 2020-09-16 ENCOUNTER — Ambulatory Visit (HOSPITAL_BASED_OUTPATIENT_CLINIC_OR_DEPARTMENT_OTHER)
Admission: RE | Admit: 2020-09-16 | Discharge: 2020-09-16 | Disposition: A | Discharge status: Home / Self Care | Payer: POS | Source: Ambulatory Visit | Attending: Emergency Medicine | Admitting: Emergency Medicine

## 2020-09-16 DIAGNOSIS — Q741 Congenital malformation of knee: Secondary | ICD-10-CM | POA: Diagnosis not present

## 2020-09-16 DIAGNOSIS — M25562 Pain in left knee: Secondary | ICD-10-CM | POA: Insufficient documentation

## 2020-09-21 ENCOUNTER — Telehealth: Payer: POS | Admitting: Family

## 2020-09-21 DIAGNOSIS — S8392XD Sprain of unspecified site of left knee, subsequent encounter: Secondary | ICD-10-CM

## 2020-09-21 MED ORDER — BACLOFEN 10 MG PO TABS: 10.0000 mg | ORAL_TABLET | Freq: Three times a day (TID) | ORAL | 0 refills | Status: DC

## 2020-09-21 NOTE — Progress Notes (Signed)
We are sorry that you are not feeling well.  Here is how we plan to help!  Based on what you have shared with me it looks like you mostly have acute back pain.  Acute back pain is defined as musculoskeletal pain that can resolve in 1-3 weeks with conservative treatment.  I recommend continuing Naprosyn 500 mg take one by mouth twice a day non-steroid anti-inflammatory (NSAID)  and I have also sent it Baclofen 10 mg every eight hours as needed which is a muscle relaxer  Some patients experience stomach irritation or in increased heartburn with anti-inflammatory drugs.  Please keep in mind that muscle relaxer's can cause fatigue and should not be taken while at work or driving.  Back pain is very common.  The pain often gets better over time.  The cause of back pain is usually not dangerous.  Most people can learn to manage their back pain on their own.  Will write a note, however, you must see your PCP for further excuses. I recommend being seen in person for further investigation.   Home Care Stay active.  Start with short walks on flat ground if you can.  Try to walk farther each day. Do not sit, drive or stand in one place for more than 30 minutes.  Do not stay in bed. Do not avoid exercise or work.  Activity can help your back heal faster. Be careful when you bend or lift an object.  Bend at your knees, keep the object close to you, and do not twist. Sleep on a firm mattress.  Lie on your side, and bend your knees.  If you lie on your back, put a pillow under your knees. Only take medicines as told by your doctor. Put ice on the injured area. Put ice in a plastic bag Place a towel between your skin and the bag Leave the ice on for 15-20 minutes, 3-4 times a day for the first 2-3 days. 210 After that, you can switch between ice and heat packs. Ask your doctor about back exercises or massage. Avoid feeling anxious or stressed.  Find good ways to deal with stress, such as exercise.  Get Help  Right Way If: Your pain does not go away with rest or medicine. Your pain does not go away in 1 week. You have new problems. You do not feel well. The pain spreads into your legs. You cannot control when you poop (bowel movement) or pee (urinate) You feel sick to your stomach (nauseous) or throw up (vomit) You have belly (abdominal) pain. You feel like you may pass out (faint). If you develop a fever.  Make Sure you: Understand these instructions. Will watch your condition Will get help right away if you are not doing well or get worse.  Your e-visit answers were reviewed by a board certified advanced clinical practitioner to complete your personal care plan.  Depending on the condition, your plan could have included both over the counter or prescription medications.  If there is a problem please reply  once you have received a response from your provider.  Your safety is important to [place].  If you have drug allergies check your prescription carefully.    You can use MyChart to ask questions about today's visit, request a non-urgent call back, or ask for a work or school excuse for 24 hours related to this e-Visit. If it has been greater than 24 hours you will need to follow up with your provider,  or enter a new e-Visit to address those concerns.  You will get an e-mail in the next two days asking about your experience.  I hope that your e-visit has been valuable and will speed your recovery. Thank you for using e-visits.  Approximately 5 minutes was spent documenting and reviewing patient's chart.

## 2020-09-26 ENCOUNTER — Other Ambulatory Visit: Payer: Self-pay

## 2020-09-26 ENCOUNTER — Ambulatory Visit (INDEPENDENT_AMBULATORY_CARE_PROVIDER_SITE_OTHER): Payer: POS | Admitting: Family Medicine

## 2020-09-26 ENCOUNTER — Ambulatory Visit (HOSPITAL_COMMUNITY)
Admission: RE | Admit: 2020-09-26 | Discharge: 2020-09-26 | Disposition: A | Discharge status: Home / Self Care | Payer: POS | Source: Ambulatory Visit | Attending: Family Medicine | Admitting: Family Medicine

## 2020-09-26 DIAGNOSIS — M79605 Pain in left leg: Secondary | ICD-10-CM | POA: Insufficient documentation

## 2020-09-26 NOTE — Progress Notes (Signed)
Left lower extremity venous duplex completed. Refer to "CV Proc" under chart review to view preliminary results.  09/26/2020 11:37 AM Eula Fried., MHA, RVT, RDCS, RDMS

## 2020-09-26 NOTE — Patient Instructions (Signed)
Your pain is consistent with lumbar radiculopathy (an irritated nerve in your back causing pain in your hip and knee). Get the doppler ultrasound of your left leg to ensure this is not from a clot given your history and pain location. Start prednisone dose pack as directed. Stop the naproxen. Send me a Clinical cytogeneticist message or call us in a week to let us know how you're doing. Consider physical therapy but we need to get this calmed down before you start that. Out of work for the next week.

## 2020-09-27 ENCOUNTER — Encounter: Payer: Self-pay | Admitting: Family Medicine

## 2020-09-27 MED ORDER — PREDNISONE 10 MG PO TABS: ORAL_TABLET | ORAL | 0 refills | Status: DC

## 2020-09-27 NOTE — Progress Notes (Signed)
PCP: Pcp, No  Subjective:   HPI: Patient is a 30 y.o. female here for left leg pain.  Patient reports for a few weeks she's had worsening left leg pain. Pain started about the lasteral aspect of the left knee and has progressed to the superior knee, up to hip and back. Associated with a heavy feeling of the leg. Soreness into the left shin. Naproxen without much help. Worse doing quad stretching. Some associated numbness as well. No bowel/bladder dysfunction. Has history of PE though completed treatment in June.  Past Medical History:  Diagnosis Date   Abnormal Pap smear 12/10/2011   Anemia    Anxiety 2010   anxiety attack;no meds;has not been dx'd   Emotional instability (HCC)    Break down easily when things are not going her way   GERD (gastroesophageal reflux disease)    History of pulmonary embolus during pregnancy    Hx of joint problems    Shoulders and knees have pain   Infection    Yeast;would get frequently   Infection    BV x 1   Kidney infection 2010   PO antibxs   Kidney stone complicating pregnancy    Migraines    no rx'd meds in the past;goes to sleep   PCOS (polycystic ovarian syndrome)    Sickle cell trait (HCC)    Sickle cell trait (HCC)    Umbilical hernia 1991   Does not cause anyp problems   Vaginal Pap smear, abnormal     Current Outpatient Medications on File Prior to Visit  Medication Sig Dispense Refill   albuterol (VENTOLIN HFA) 108 (90 Base) MCG/ACT inhaler Inhale 2 puffs into the lungs every 6 (six) hours as needed for wheezing or shortness of breath. 8 g 0   baclofen (LIORESAL) 10 MG tablet Take 1 tablet (10 mg total) by mouth 3 (three) times daily. 30 each 0   iron polysaccharides (NIFEREX) 150 MG capsule Take 1 capsule (150 mg total) by mouth daily. 30 capsule 1   Prenatal Vit-Fe Fumarate-FA (MULTIVITAMIN-PRENATAL) 27-0.8 MG TABS tablet Take 1 tablet by mouth daily at 12 noon.     sertraline (ZOLOFT) 25 MG tablet Take 1 tablet (25 mg  total) by mouth daily. 30 tablet 2   No current facility-administered medications on file prior to visit.    Past Surgical History:  Procedure Laterality Date   DILATION AND CURETTAGE OF UTERUS  2009   No complications   IR ANGIOGRAM SELECTIVE EACH ADDITIONAL VESSEL  12/05/2019   IR ANGIOGRAM SELECTIVE EACH ADDITIONAL VESSEL  12/05/2019   IR INFUSION THROMBOL ARTERIAL INITIAL (MS)  12/05/2019   IR THROMB F/U EVAL ART/VEN FINAL DAY (MS)  12/06/2019   IR US GUIDE VASC ACCESS LEFT  12/05/2019   IR US GUIDE VASC ACCESS LEFT  12/05/2019   WISDOM TOOTH EXTRACTION      No Known Allergies  Social History   Socioeconomic History   Marital status: Single    Spouse name: Not on file   Number of children: 2   Years of education: 15   Highest education level: Not on file  Occupational History   Not on file  Tobacco Use   Smoking status: Former    Packs/day: 0.25    Years: 5.00    Pack years: 1.25    Types: Cigarettes    Quit date: 11/18/2011    Years since quitting: 8.8   Smokeless tobacco: Never  Vaping Use   Vaping Use: Never used  Substance and Sexual Activity   Alcohol use: Not Currently    Comment: occ   Drug use: No   Sexual activity: Yes    Partners: Male    Birth control/protection: None  Other Topics Concern   Not on file  Social History Narrative   Not on file   Social Determinants of Health   Financial Resource Strain: Not on file  Food Insecurity: Not on file  Transportation Needs: Not on file  Physical Activity: Not on file  Stress: Not on file  Social Connections: Not on file  Intimate Partner Violence: Not on file    Family History  Problem Relation Age of Onset   Sickle cell trait Mother    Hypertension Mother    Anemia Mother    Sickle cell trait Brother    Sickle cell anemia Maternal Aunt        Recently deceased from [place] crisis   Diabetes Maternal Aunt    Sickle cell anemia Maternal Grandmother        Deceased   Heart disease Maternal Aunt     Asthma Sister        1/2 sibling;father's child   Hyperthyroidism Paternal Grandmother    Scoliosis Paternal Grandmother    Arthritis Paternal Grandmother    Migraines Father    Other Father        Joint Problems;shoulders and knees   Hernia Maternal Aunt        Umbilical   Hypertension Maternal Uncle    Diabetes type I Cousin        maternal   Diabetes Paternal Grandfather     BP 132/84   Ht 5\' 7"  (1.702 m)   Wt 142 lb (64.4 kg)   LMP 09/06/2020   BMI 22.24 kg/m   No flowsheet data found.  No flowsheet data found.  Review of Systems: See HPI above.     Objective:  Physical Exam:  Gen: NAD, comfortable in exam room  Back: No gross deformity, scoliosis. TTP left gluteal region, paraspinal lumbar region.  No midline or bony TTP. FROM. Strength LEs 5/5 all muscle groups.   3+ MSRs in patellar and achilles tendons, equal bilaterally. Negative SLRs. Sensation intact to light touch bilaterally.  Left hip: FROM with negative logroll.  Left knee: No gross deformity, ecchymoses, swelling. Mild TTP IT band, lateral gastroc. FROM with normal strength. Negative ant/post drawers. Negative valgus/varus testing. Negative lachman. Negative mcmurrays, apleys. NV intact distally.   Assessment & Plan:  1. Left leg, low back pain - consistent with lumbar radiculopathy.  Doppler u/s negative.  Start prednisone dose pack as naproxen not helping.  Consider physical therapy if improving, imaging lumbar spine if not improving.  Out of work next week.

## 2020-10-04 ENCOUNTER — Other Ambulatory Visit: Payer: Self-pay

## 2020-10-04 DIAGNOSIS — M79605 Pain in left leg: Secondary | ICD-10-CM

## 2020-10-04 MED ORDER — PREDNISONE 10 MG PO TABS: ORAL_TABLET | ORAL | 0 refills | Status: DC

## 2020-10-12 ENCOUNTER — Other Ambulatory Visit: Payer: POS

## 2020-10-19 MED ORDER — ONDANSETRON HCL 4 MG PO TABS: 4.0000 mg | ORAL_TABLET | Freq: Three times a day (TID) | ORAL | 0 refills | Status: DC | PRN

## 2020-10-19 MED ORDER — HYDROCODONE-ACETAMINOPHEN 5-325 MG PO TABS: 1.0000 | ORAL_TABLET | ORAL | 0 refills | Status: DC | PRN

## 2020-10-19 NOTE — Addendum Note (Signed)
Addended by: Lenda Kelp on: 10/19/2020 08:35 AM   Modules accepted: Orders

## 2020-10-22 ENCOUNTER — Ambulatory Visit
Admission: RE | Admit: 2020-10-22 | Discharge: 2020-10-22 | Disposition: A | Discharge status: Home / Self Care | Payer: POS | Source: Ambulatory Visit | Attending: Family Medicine | Admitting: Family Medicine

## 2020-10-22 DIAGNOSIS — M79605 Pain in left leg: Secondary | ICD-10-CM

## 2020-10-22 IMAGING — MR MR LUMBAR SPINE W/O CM
4 of 5 series · 26 of 48 positions shown · non-contrast
Comparison: None available.

CLINICAL DATA: Initial evaluation for lumbar radiculopathy, left
knee pain with extension to the left hip and foot.

EXAM:
MRI LUMBAR SPINE WITHOUT CONTRAST
TECHNIQUE: Multiplanar, multisequence MR imaging of the lumbar spine was
performed. No intravenous contrast was administered.

[Series 3: T2 · sagittal · 4.0mm · 1.09mm/px · 6 of 15 slices shown (1 of 2)]
[im 1/15]
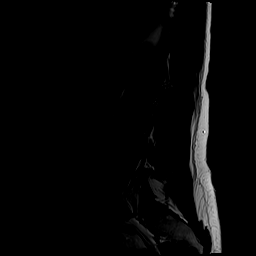
[im 3/15]
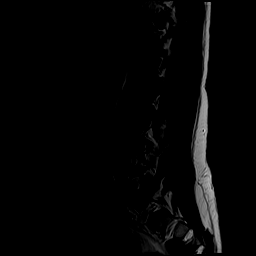
[im 6/15]
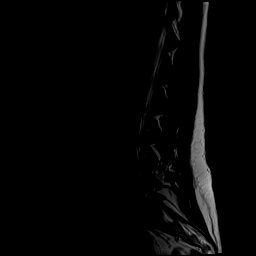
[im 9/15]
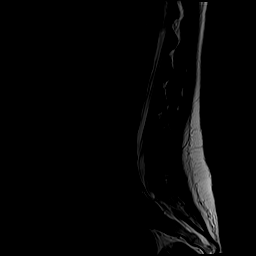
[im 12/15]
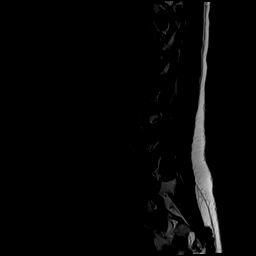
[im 15/15]
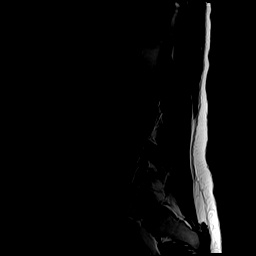

[Series 5: T1 · sagittal · 4.0mm · 1.09mm/px · 5 of 15 slices shown (1 of 2)]
[im 1/15]
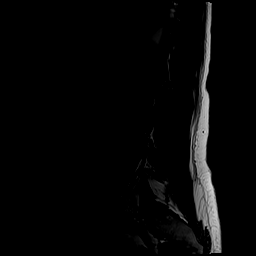
[im 4/15]
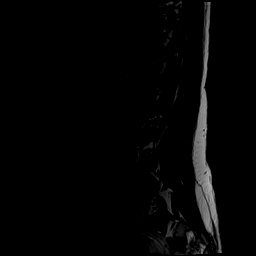
[im 8/15]
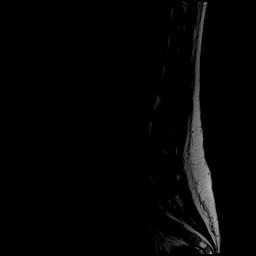
[im 11/15]
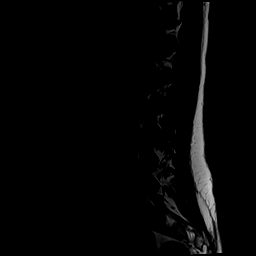
[im 15/15]
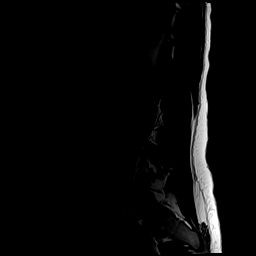

[Series 6: T2 · axial · 4.0mm · 0.39mm/px · z∈[-94,+114]mm · 10 of 44 slices shown (2 of 2)]
[im 3/44]
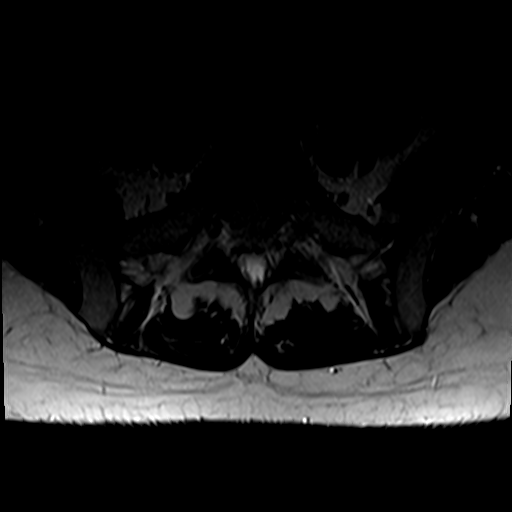
[im 6/44]
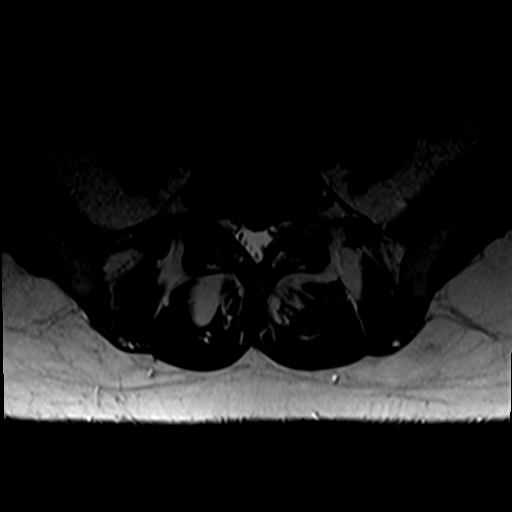
[im 9/44]
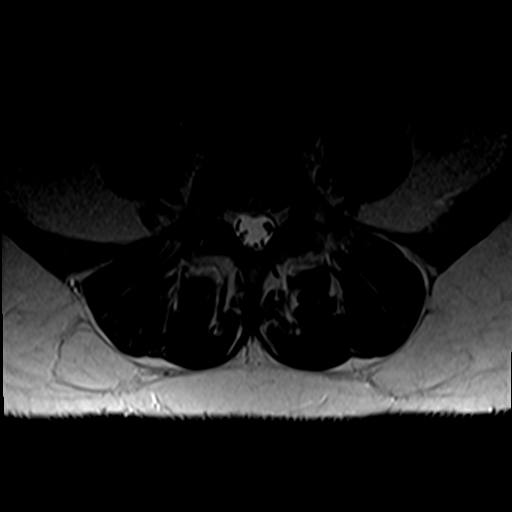
[im 15/44]
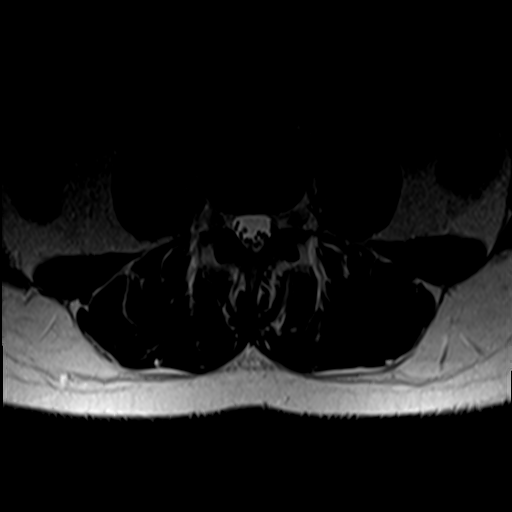
[im 21/44]
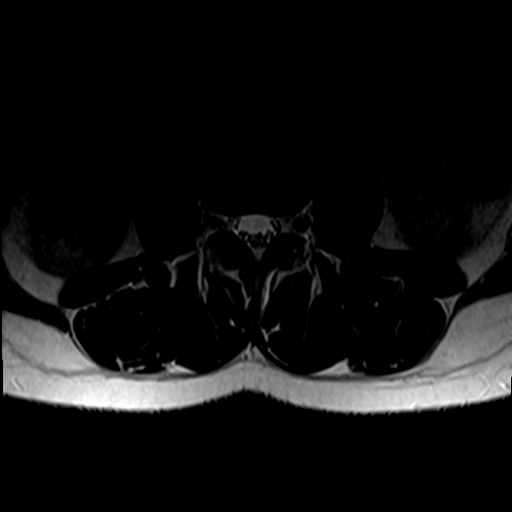
[im 23/44]
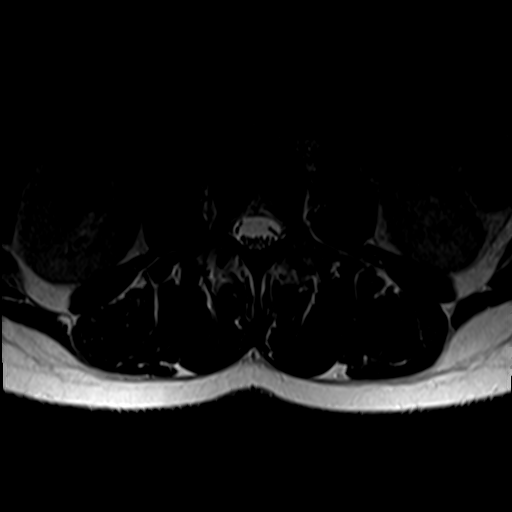
[im 26/44]
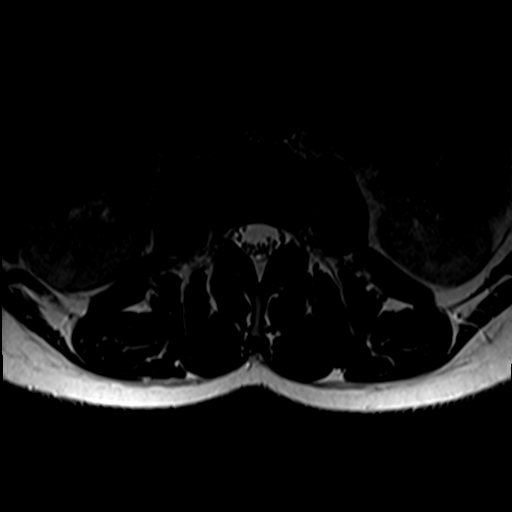
[im 32/44]
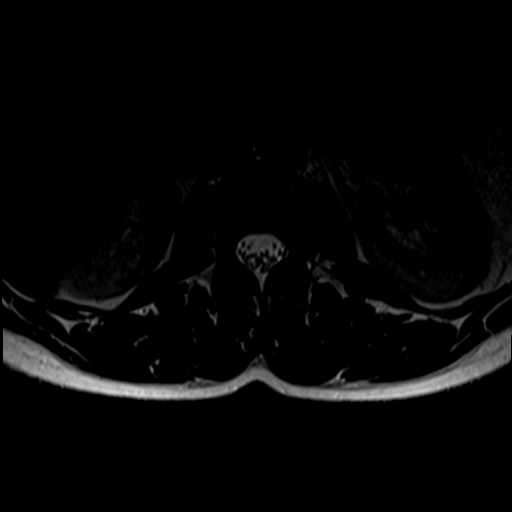
[im 38/44]
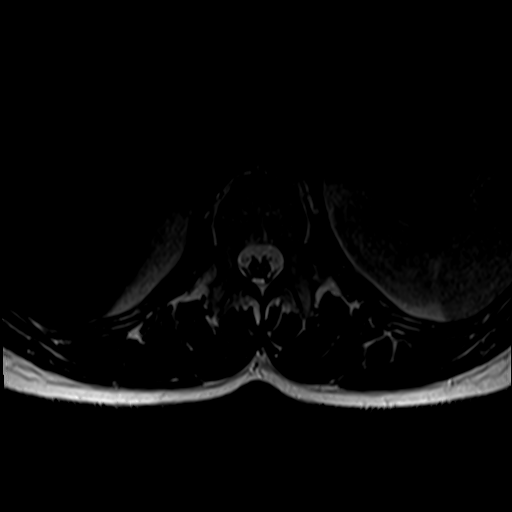
[im 44/44]
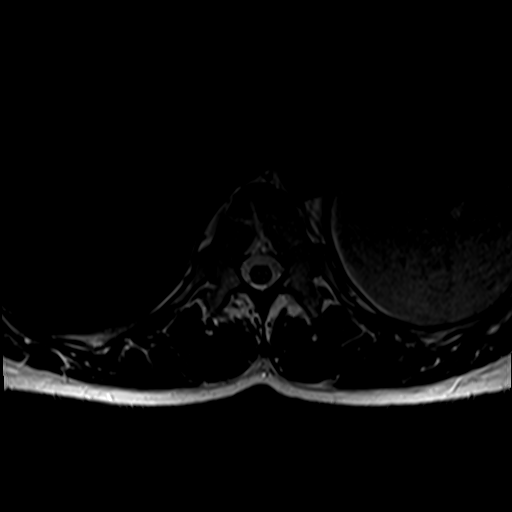

[Series 7: T1 · axial · 4.0mm · 0.39mm/px · z∈[-94,+85]mm · 5 of 44 slices shown (2 of 2)]
[im 3/44]
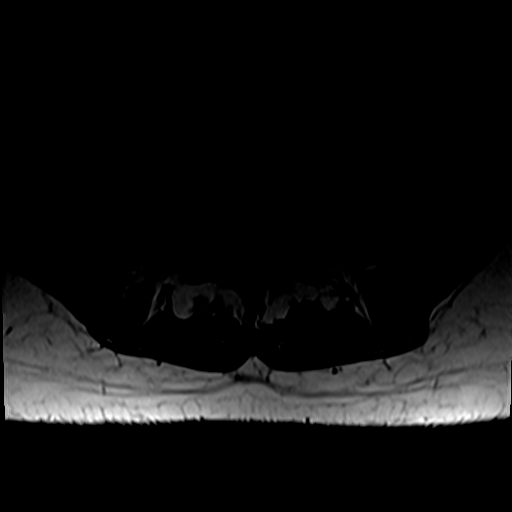
[im 6/44]
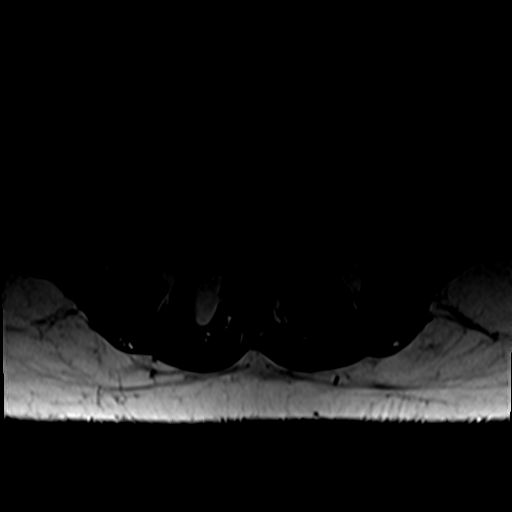
[im 9/44]
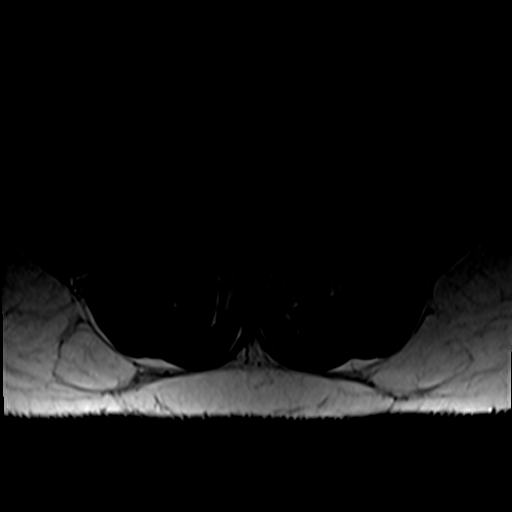
[im 23/44]
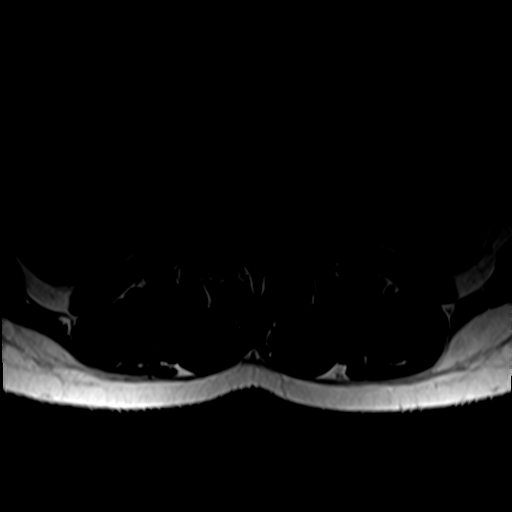
[im 38/44]
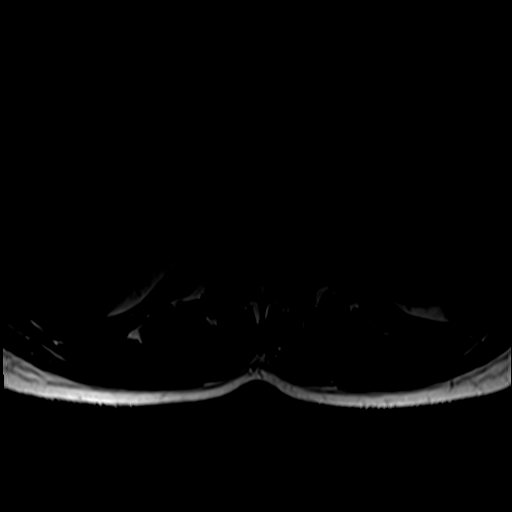

[26 of 48 positions shown; findings below may reference images not displayed]

FINDINGS: Segmentation: Standard. Lowest well-formed disc space labeled the
L5-S1 level.

Alignment: Physiologic with preservation of the normal lumbar
lordosis. No listhesis.

Vertebrae: Vertebral body height maintained without acute or chronic
fracture. Bone marrow signal intensity within normal limits. No
discrete or worrisome osseous lesions. No abnormal marrow edema.

Conus medullaris and cauda equina: Conus extends to the L1 level.
Conus and cauda equina appear normal.

Paraspinal and other soft tissues: Paraspinous soft tissues within
normal limits. Few scattered subcentimeter benign appearing cyst
noted about the kidneys bilaterally. Note made of a 6 mm T2
hypointense/T1 hyperintense lesion at the interpolar left kidney
(series 6, image 19), nonspecific, but could reflect a proteinaceous
and/or hemorrhagic cyst. Remainder of the visualized visceral
structures otherwise unremarkable.

Disc levels:

L1-2:  Unremarkable.

L2-3: Normal interspace. Mild facet hypertrophy. No canal or
foraminal stenosis.

L3-4: Normal interspace. Mild facet hypertrophy. No canal or
foraminal stenosis.

L4-5: Normal interspace. Mild facet hypertrophy. No canal or
foraminal stenosis.

L5-S1: Disc desiccation. Superimposed small central to left
subarticular disc protrusion with slight inferior migration (series
6, image 39). Protruding disc closely approximates the descending
left S1 nerve root as it courses through the left lateral recess.
Superimposed mild facet hypertrophy. Mild bilateral lateral recess
stenosis, left greater than right. Central canal remains patent.
Mild bilateral L5 foraminal stenosis.
IMPRESSION: 1. Small central to left subarticular disc protrusion with slight
inferior migration at L5-S1, closely approximating and potentially
irritating the descending left S1 nerve root.
2. Mild bilateral facet hypertrophy at L2-3 through L5-S1.
3. 6 mm T1 hyperintense lesion at the interpolar left kidney,
nonspecific, but likely a small proteinaceous and/or hemorrhagic
cyst. Further evaluation with dedicated renal mass protocol CT
and/or MRI could be performed for confirmatory purposes as
warranted.

## 2020-10-25 ENCOUNTER — Encounter: Payer: Self-pay | Admitting: Family Medicine

## 2020-10-25 ENCOUNTER — Other Ambulatory Visit: Payer: Self-pay | Admitting: Family Medicine

## 2020-10-25 ENCOUNTER — Other Ambulatory Visit: Payer: Self-pay

## 2020-10-25 DIAGNOSIS — M79605 Pain in left leg: Secondary | ICD-10-CM

## 2020-10-25 DIAGNOSIS — N289 Disorder of kidney and ureter, unspecified: Secondary | ICD-10-CM

## 2020-10-25 DIAGNOSIS — N2889 Other specified disorders of kidney and ureter: Secondary | ICD-10-CM

## 2020-10-25 NOTE — Addendum Note (Signed)
Addended by: Rutha Bouchard E on: 10/25/2020 01:42 PM   Modules accepted: Orders

## 2020-10-28 ENCOUNTER — Ambulatory Visit
Admission: RE | Admit: 2020-10-28 | Discharge: 2020-10-28 | Disposition: A | Discharge status: Home / Self Care | Payer: POS | Source: Ambulatory Visit | Attending: Family Medicine | Admitting: Family Medicine

## 2020-10-28 ENCOUNTER — Other Ambulatory Visit: Payer: Self-pay

## 2020-10-28 DIAGNOSIS — M79605 Pain in left leg: Secondary | ICD-10-CM

## 2020-10-28 IMAGING — XA Imaging study
1 series · 1 of 1 positions shown · non-contrast
Comparison: none

CLINICAL DATA: Low back and left lower extremity pain after injury
in plunging elevator. Lumbar radiculopathy, left lower extremity
pain. Small central to left subarticular disc protrusion with slight
inferior migration at L5-S1, closely approximating and potentially
irritating the descending left S1 nerve root.

[Series 1: ortho standard · 1 of 1 slices shown]
[im 1/1]
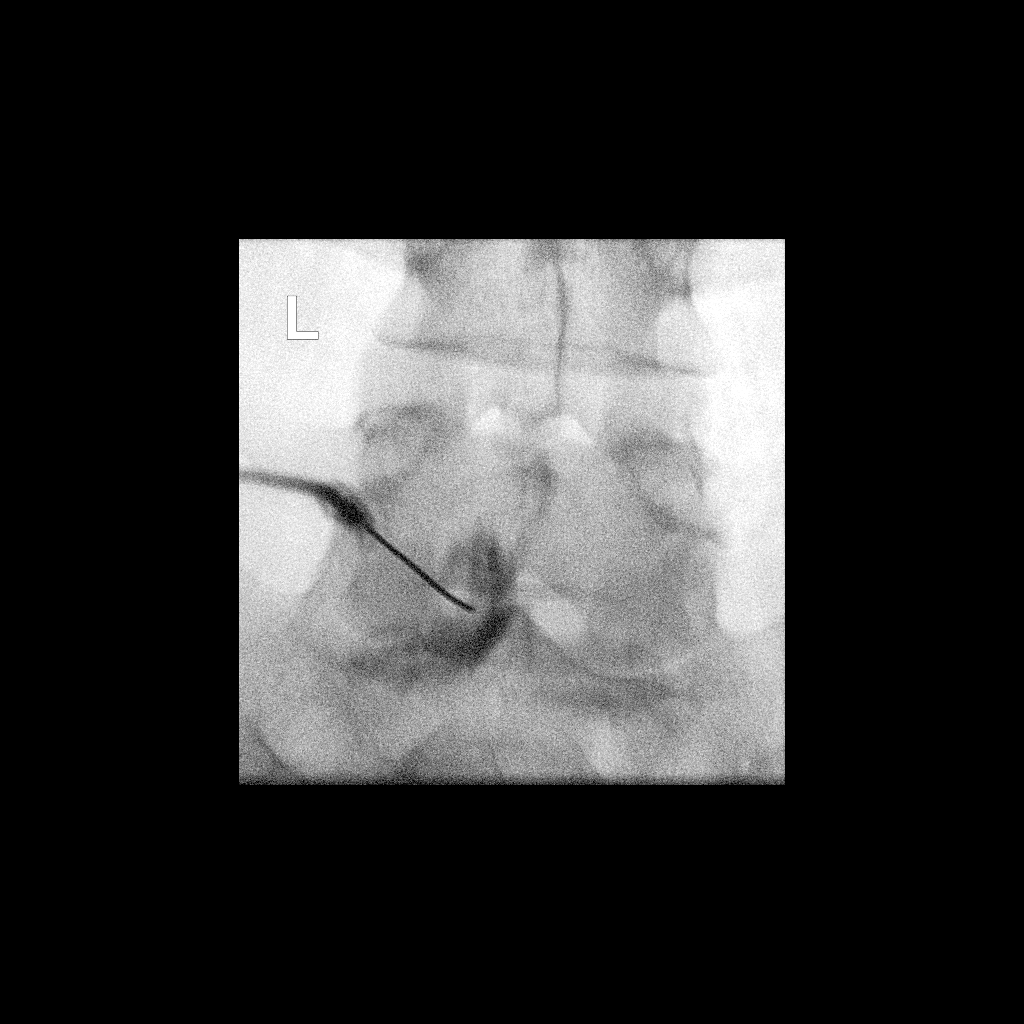

[1 of 1 positions shown; findings below may reference images not displayed]

EXAM:
LUMBAR EPIDURAL INJECTION:

DIAGNOSTIC EPIDURAL INJECTION:

THERAPEUTIC EPIDURAL INJECTION:

PROCEDURE:
The procedure, risks, benefits, and alternatives were explained to
the patient. Questions regarding the procedure were encouraged and
answered. The patient understands and consents to the procedure.

An interlaminar approach was performed on left at L5-S1. The
overlying skin was cleansed and anesthetized. A 20 gauge epidural
needle was advanced using loss-of-resistance technique.

Injection of Isovue-M 200 shows a good epidural pattern with spread
above and below the level of needle placement, primarily on the
left. No vascular opacification is seen.

80mg of Depo-Medrol mixed with 2ml lidocaine 1% were instilled. The
procedure was well-tolerated, and the patient was discharged thirty
minutes following the injection in good condition.

FLUOROSCOPY TIME:  20 seconds; 7 [25] DAP

COMPLICATIONS:
None immediate
IMPRESSION: Technically successful epidural injection on the left at L5-S1.

## 2020-10-28 MED ORDER — IOPAMIDOL (ISOVUE-M 200) INJECTION 41%
1.0000 mL | Freq: Once | INTRAMUSCULAR | Status: AC
  Administered 2020-10-28: 1 mL via EPIDURAL

## 2020-10-28 MED ORDER — METHYLPREDNISOLONE ACETATE 40 MG/ML INJ SUSP (RADIOLOG
80.0000 mg | Freq: Once | INTRAMUSCULAR | Status: AC
  Administered 2020-10-28: 80 mg via EPIDURAL

## 2020-10-28 NOTE — Discharge Instructions (Signed)

## 2020-11-06 ENCOUNTER — Other Ambulatory Visit: Payer: POS

## 2020-11-16 ENCOUNTER — Encounter: Payer: Self-pay | Admitting: Orthopaedic Surgery

## 2020-11-16 ENCOUNTER — Other Ambulatory Visit: Payer: Self-pay

## 2020-11-16 ENCOUNTER — Ambulatory Visit (INDEPENDENT_AMBULATORY_CARE_PROVIDER_SITE_OTHER): Payer: POS | Admitting: Orthopaedic Surgery

## 2020-11-16 ENCOUNTER — Ambulatory Visit (INDEPENDENT_AMBULATORY_CARE_PROVIDER_SITE_OTHER): Payer: POS

## 2020-11-16 DIAGNOSIS — M25552 Pain in left hip: Secondary | ICD-10-CM | POA: Diagnosis not present

## 2020-11-16 NOTE — Progress Notes (Signed)
Office Visit Note   Patient: Tasha Wu           Date of Birth: [birth year]           MRN: [id number] Visit Date: 11/16/2020              Requested by: Lisbeth Renshaw, MD [address] [place],  [place] [address] PCP: Pcp, No   Assessment & Plan: Visit Diagnoses:  1. Pain in left hip     Plan: Impression is questionable left -sided piriformis syndrome with irritation around the sciatic nerve causing radicular type symptoms to the left leg.  The patient currently has physical therapy scheduled for next week which I believe is appropriate for the next course of treatment.  The fact that she gets no relief with NSAIDs, steroids, muscle relaxers or pain medicine, there is nothing that we will prescribe today.  She does work for the [employer] and is afraid that she will not have time to attend physical therapy if allowed to return to work so we provided her with an out of work note for the next 6 weeks.  She will follow-up with [place] at that point for repeat evaluation.  Call with concerns or questions in the meantime.  Follow-Up Instructions: Return in about 6 weeks (around 12/28/2020).   Orders:  Orders Placed This Encounter  Procedures   XR Pelvis 1-2 Views   No orders of the defined types were placed in this encounter.     Procedures: No procedures performed   Clinical Data: No additional findings.   Subjective: Chief Complaint  Patient presents with   Left Hip - Pain    HPI patient is a very pleasant 30 year old female who comes in today for evaluation of left hip pain.  This began last summer.  No specific injury that she is aware of although she does note that she passed out and sustained a mechanical fall over the foot of her bed after suffering a pulmonary embolism just prior to the onset of symptoms.  While she was in the hospital recovering from the PE, she was noted to be 8 to [redacted] weeks pregnant.  She experienced pain throughout the entire  pregnancy which worsened about 2 weeks after returning to work this past July after giving birth in April.  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  The pain she has been dealing with this to the lateral buttock and radiates to the knee.  She feels a heaviness at times which radiates down the leg.  Symptoms appear to be worse when she is sitting on the left butt cheek.  She initially wore a knee brace and was doing IT band exercises all without relief of symptoms.  She has also previously tried muscle relaxers, anti-inflammatories and prednisone tapers without relief.  She started getting numbness and burning in her foot and was referred out for an MRI of her lumbar spine.  MRI of the lumbar spine showed small disc protrusion L5-S1 as well as mild facet hypertrophy L2-3 through L5-S1.  She was referred out for epidural steroid injection which really provided minimal if any relief.  She was seen by Dr. Conchita Paris soon after where she was told that her back looked okay and there is no surgery to be performed.  She is here today for further evaluation and treatment recommendation.  Review of Systems as detailed in HPI.  All others reviewed and  are negative.   Objective: Vital Signs: There were no vitals taken for this visit.  Physical Exam well-developed well-nourished female in no acute distress.  Alert and oriented x3.  Ortho Exam left hip exam shows negative logroll, negative FADIR negative Stinchfield's.  She does not have any tenderness to the proximal hamstring.  She does have moderate tenderness along the piriformis and into the sciatic notch.  No tenderness to the lateral hip.  Negative straight leg raise.  No focal weakness.  Unremarkable knee exam.  She is neurovascular intact distally.  Specialty Comments:  No specialty comments available.  Imaging: XR Pelvis 1-2 Views  Result Date: 11/16/2020 No acute or structural abnormalities    PMFS  History: Patient Active Problem List   Diagnosis Date Noted   SVD (spontaneous vaginal delivery) 06/25/2020   Normal postpartum course 06/25/2020   Anemia affecting pregnancy in third trimester 06/25/2020   Encounter for induction of labor 06/23/2020   Bilateral pulmonary embolism (HCC) 12/04/2019   High-risk pregnancy in first trimester    Anemia 12/21/2017   Sickle cell trait (HCC) 07/21/2017   Anxiety 10/03/2016   Gastroesophageal reflux disease 10/03/2016   Abnormal Pap smear 12/10/2011   Umbilical hernia 11/29/2011   H/O pyelonephritis 11/29/2011   Past Medical History:  Diagnosis Date   Abnormal Pap smear 12/10/2011   Anemia    Anxiety 2010   anxiety attack;no meds;has not been dx'd   Emotional instability (HCC)    Break down easily when things are not going her way   GERD (gastroesophageal reflux disease)    History of pulmonary embolus during pregnancy    Hx of joint problems    Shoulders and knees have pain   Infection    Yeast;would get frequently   Infection    BV x 1   Kidney infection 2010   PO antibxs   Kidney stone complicating pregnancy    Migraines    no rx'd meds in the past;goes to sleep   PCOS (polycystic ovarian syndrome)    Sickle cell trait (HCC)    Sickle cell trait (HCC)    Umbilical hernia 1991   Does not cause anyp problems   Vaginal Pap smear, abnormal     Family History  Problem Relation Age of Onset   Sickle cell trait Mother    Hypertension Mother    Anemia Mother    Sickle cell trait Brother    Sickle cell anemia Maternal Aunt        Recently deceased from [place] crisis   Diabetes Maternal Aunt    Sickle cell anemia Maternal Grandmother        Deceased   Heart disease Maternal Aunt    Asthma Sister        1/2 sibling;father's child   Hyperthyroidism Paternal Grandmother    Scoliosis Paternal Grandmother    Arthritis Paternal Grandmother    Migraines Father    Other Father        Joint Problems;shoulders and knees   Hernia  Maternal Aunt        Umbilical   Hypertension Maternal Uncle    Diabetes type I Cousin        maternal   Diabetes Paternal Grandfather     Past Surgical History:  Procedure Laterality Date   DILATION AND CURETTAGE OF UTERUS  2009   No complications   IR ANGIOGRAM SELECTIVE EACH ADDITIONAL VESSEL  12/05/2019   IR ANGIOGRAM SELECTIVE EACH ADDITIONAL VESSEL  12/05/2019   IR  INFUSION THROMBOL ARTERIAL INITIAL (MS)  12/05/2019   IR THROMB F/U EVAL ART/VEN FINAL DAY (MS)  12/06/2019   IR US GUIDE VASC ACCESS LEFT  12/05/2019   IR US GUIDE VASC ACCESS LEFT  12/05/2019   WISDOM TOOTH EXTRACTION     Social History   Occupational History   Not on file  Tobacco Use   Smoking status: Former    Packs/day: 0.25    Years: 5.00    Pack years: 1.25    Types: Cigarettes    Quit date: 11/18/2011    Years since quitting: 9.0   Smokeless tobacco: Never  Vaping Use   Vaping Use: Never used  Substance and Sexual Activity   Alcohol use: Not Currently    Comment: occ   Drug use: No   Sexual activity: Yes    Partners: Male    Birth control/protection: None

## 2020-11-22 ENCOUNTER — Other Ambulatory Visit: Payer: Self-pay

## 2020-11-22 ENCOUNTER — Encounter: Payer: Self-pay | Admitting: Physical Therapy

## 2020-11-22 ENCOUNTER — Ambulatory Visit: Payer: POS | Attending: Neurosurgery | Admitting: Physical Therapy

## 2020-11-22 DIAGNOSIS — M6281 Muscle weakness (generalized): Secondary | ICD-10-CM | POA: Diagnosis present

## 2020-11-22 DIAGNOSIS — R252 Cramp and spasm: Secondary | ICD-10-CM | POA: Insufficient documentation

## 2020-11-22 DIAGNOSIS — M5416 Radiculopathy, lumbar region: Secondary | ICD-10-CM | POA: Insufficient documentation

## 2020-11-22 NOTE — Patient Instructions (Signed)
Access Code: [email or web address] URL: [email or web address] Date: 11/22/2020 Prepared by: Raynelle Fanning  Exercises Lying Prone with 1 Pillow - 4-5 x daily - 7 x weekly - 1 sets - 1 reps - 5 min hold Seated Piriformis Stretch - 2 x daily - 7 x weekly - 1 sets - 3 reps - 20-30 sec hold  Patient Education Healthy Posture: How to Hold and Lift a Firefighter

## 2020-11-22 NOTE — Therapy (Signed)
[facility] [address] [place], [place], [address] Phone: [phone]   Fax:  [phone]  Physical Therapy Evaluation  Patient Details  Name: Tasha Wu MRN: [id number] Date of Birth: 01/06/1991 Referring Provider (PT): Lisbeth Renshaw MD   Encounter Date: 11/22/2020   PT End of Session - 11/22/20 0938     Visit Number 1    Number of Visits 12    Date for PT Re-Evaluation 01/17/21    Authorization Type [employer] with [employer] MCD secondary (needs approval after 12 visit)    Authorization - Number of Visits 12    PT Start Time [phone]    PT Stop Time 1018    PT Time Calculation (min) 39 min    Activity Tolerance Patient tolerated treatment well    Behavior During Therapy [facility] for tasks assessed/performed             Past Medical History:  Diagnosis Date   Abnormal Pap smear 12/10/2011   Anemia    Anxiety 2010   anxiety attack;no meds;has not been dx'd   Emotional instability (HCC)    Break down easily when things are not going her way   GERD (gastroesophageal reflux disease)    History of pulmonary embolus during pregnancy    Hx of joint problems    Shoulders and knees have pain   Infection    Yeast;would get frequently   Infection    BV x 1   Kidney infection 2010   PO antibxs   Kidney stone complicating pregnancy    Migraines    no rx'd meds in the past;goes to sleep   PCOS (polycystic ovarian syndrome)    Sickle cell trait (HCC)    Sickle cell trait (HCC)    Umbilical hernia 1991   Does not cause anyp problems   Vaginal Pap smear, abnormal     Past Surgical History:  Procedure Laterality Date   DILATION AND CURETTAGE OF UTERUS  2009   No complications   IR ANGIOGRAM SELECTIVE EACH ADDITIONAL VESSEL  12/05/2019   IR ANGIOGRAM SELECTIVE EACH ADDITIONAL VESSEL  12/05/2019   IR INFUSION THROMBOL ARTERIAL INITIAL (MS)  12/05/2019   IR THROMB F/U EVAL ART/VEN FINAL DAY (MS)  12/06/2019   IR US GUIDE  VASC ACCESS LEFT  12/05/2019   IR US GUIDE VASC ACCESS LEFT  12/05/2019   WISDOM TOOTH EXTRACTION      There were no vitals filed for this visit.    Subjective Assessment - 11/22/20 0940     Subjective Patient began having pain when she was pregnant in April 2022. Worsened in July after returning to work at [employer]. 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 Pain is in left buttock and pelvic area into left knee and ankle. No relief from muscle relaxors. Reports N/T daily. Cortisone injection in August.    Pertinent History DVTs during pregnancy only, not on blood thinners anymore. Recently retested and neg for clots.; anemia, anxiety    How long can you sit comfortably? 20 minutes driving    How long can you stand comfortably? 15 min standing/walking    Diagnostic tests xrays negative; MRI: Small central to left subarticular disc protrusion with slight  inferior migration at L5-S1, closely approximating and potentially  irritating the descending left S1 nerve root.    Patient Stated Goals to get rid of pain and RTW    Currently in Pain? 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  No    Is the patient reluctant to leave their home because of a fear of falling?  No      Home Environment   Living Environment Private residence    Living Arrangements Spouse/significant other;Children    Additional Comments no stairs      Prior Function   Level of Independence Independent    Vocation Full time employment    Vocation Requirements see subjective      Posture/Postural Control   Posture/Postural Control Postural limitations    Postural Limitations Increased lumbar lordosis;Anterior pelvic tilt;Weight shift right    Posture Comments may have slight left lateral shift      ROM / Strength   AROM / PROM / Strength AROM;Strength      AROM   Overall AROM Comments ful lumbar ROM; pain with flexion and right SB (in left buttock); bil ankle DF 4+/5      Strength   Overall Strength Comments instability in core with MMT    Strength Assessment Site Hip;Knee    Right/Left Hip Left    Right Hip Flexion 5/5    Right Hip Extension 5/5    Right Hip ABduction 5/5    Right Hip ADduction 5/5    Left Hip Flexion 4/5    Left Hip Extension 5/5    Left Hip ABduction 5/5    Left Hip ADduction 5/5    Right/Left Knee Right;Left    Right Knee Flexion 4/5    Right Knee Extension 5/5    Left Knee Flexion 4-/5    Left Knee Extension 5/5      Flexibility   Soft Tissue Assessment /Muscle Length yes    Hamstrings bil HS left >right    Quadriceps bil tightness    Piriformis right tight      Palpation   Spinal mobility not tested due to lack of time    Palpation comment left gluteals/lateral gastroc      Special Tests   Other special tests +slump and SLR on left  Objective measurements completed on examination: See above findings.                PT Education - 11/22/20 1231      Education Details HEP; seated posture education; provided h/o of posture with ADLS and with a baby, but needs to be reviewed with pt    Person(s) Educated Patient    Methods Explanation;Demonstration;Handout;Tactile cues;Verbal cues    Comprehension Verbalized understanding;Returned demonstration              PT Short Term Goals - 11/22/20 1502       PT SHORT TERM GOAL #1   Title ind with initial HEP    Time 2    Period Weeks    Status New      PT SHORT TERM GOAL #2   Title Patient able to consistently centralize LE sx to low back with positioning and/or exercises.    Time 4    Period Weeks    Status New    Target Date 12/20/20               PT Long Term Goals - 11/22/20 1504       PT LONG TERM GOAL #1   Title Patient Ind with proper body mechanics for lifting and ADLs to prevent further injury.    Time 8    Period Weeks    Status New    Target Date 01/17/21      PT LONG TERM GOAL #2   Title Decreased pain with ADLS by >=80% to allow RTW.    Time 8    Period Weeks    Status New      PT LONG TERM GOAL #3   Title Patient to demonstrate improved core strength by doing a plank for 1 min, side planks x 30 sec and stabilizing trunk with seated MMT of LEs.    Time 8    Period Weeks    Status New      PT LONG TERM GOAL #4   Title Patient able to lift and carry a minimum of 40# without increased sx to allow RTW    Time 8    Period Weeks    Status New      PT LONG TERM GOAL #5   Title Patient able to sleep without waking from pain    Time 8    Period Weeks    Status New      Additional Long Term Goals   Additional Long Term Goals Yes      PT LONG TERM GOAL #6   Title Improved tolerance to standing and walking to >=45 min    Time 8    Period Weeks    Status New                    Plan - 11/22/20 1228     Clinical Impression Statement Patient presents with c/o of left buttock and leg pain to foot since being pregnant in April 2022. She  now has pain with bending, lifting, sittiing, standing and walking. Recent MRI shows a bulging lumbar disc at L5/S1.  Pain worsened after returning to work in July where she does a lot of lifting and bending. She is currently off work. She also has small children. She has increased lumbar lordosis in standing and gets relief when lying in neutral spine over pillow in prone. She has weak core strength with MMT of B LE and some LE  weakness. She also reports daily N/T. She self reports and demonstrates poor sitting in posture and is unable to sit or drive greater than 20 min comfortably. Standing and walking is limited to 15 min. She will benefit from skilled PT to return her to painfree ADLs and to strengthen her core and LEs to prevent further injury and allow her to RTW.    Personal Factors and Comorbidities Comorbidity 3+    Comorbidities DVTs during pregnancy only, not on blood thinners anymore. Recently retested and neg for clots.; anemia, anxiety    Examination-Activity Limitations Bend;Sit;Stand;Lift    Examination-Participation Restrictions Occupation    Stability/Clinical Decision Making Stable/Uncomplicated    Clinical Decision Making Low    Rehab Potential Excellent    PT Frequency 2x / week    PT Duration 8 weeks   12 visits   PT Treatment/Interventions ADLs/Self Care Home Management;Cryotherapy;Electrical Stimulation;Moist Heat;Traction;Therapeutic activities;Therapeutic exercise;Neuromuscular re-education;Patient/family education;Manual techniques;Dry needling;Spinal Manipulations    PT Next Visit Plan Assess response to prone over pillow, progress to neutral spine/extension bias as tol; review ADL and posture with baby handouts; core and lumbar stab    PT Home Exercise Plan [id number]    Consulted and Agree with Plan of Care Patient             Patient will benefit from skilled therapeutic intervention in order to improve the following deficits and impairments:  Pain, Increased muscle  spasms, Improper body mechanics, Impaired flexibility, Decreased strength, Postural dysfunction  Visit Diagnosis: Radiculopathy, lumbar region  Muscle weakness (generalized)  Cramp and spasm     Problem List Patient Active Problem List   Diagnosis Date Noted   SVD (spontaneous vaginal delivery) 06/25/2020   Normal postpartum course 06/25/2020   Anemia affecting pregnancy in third trimester 06/25/2020   Encounter for induction of labor 06/23/2020   Bilateral pulmonary embolism (HCC) 12/04/2019   High-risk pregnancy in first trimester    Anemia 12/21/2017   Sickle cell trait (HCC) 07/21/2017   Anxiety 10/03/2016   Gastroesophageal reflux disease 10/03/2016   Abnormal Pap smear 12/10/2011   Umbilical hernia 11/29/2011   H/O pyelonephritis 11/29/2011    Solon Palm, PT 11/22/2020, 3:17 PM  [facility] [address] [place], [place], [address] Phone: [phone]   Fax:  [phone]  Name: Mumtaz Lovins MRN: [id number] Date of Birth: Jul 26, 1990

## 2020-11-25 ENCOUNTER — Ambulatory Visit: Payer: POS | Admitting: Physical Therapy

## 2020-11-30 ENCOUNTER — Ambulatory Visit: Payer: POS | Admitting: Physical Therapy

## 2020-12-02 ENCOUNTER — Ambulatory Visit: Payer: POS | Admitting: Physical Therapy

## 2020-12-06 ENCOUNTER — Ambulatory Visit
Admission: RE | Admit: 2020-12-06 | Discharge: 2020-12-06 | Disposition: A | Discharge status: Home / Self Care | Payer: POS | Source: Ambulatory Visit | Attending: Family Medicine | Admitting: Family Medicine

## 2020-12-06 ENCOUNTER — Other Ambulatory Visit: Payer: Self-pay

## 2020-12-06 DIAGNOSIS — N289 Disorder of kidney and ureter, unspecified: Secondary | ICD-10-CM

## 2020-12-06 DIAGNOSIS — N2889 Other specified disorders of kidney and ureter: Secondary | ICD-10-CM

## 2020-12-06 IMAGING — MR MR ABDOMEN WO/W CM
17 series · 48 of 48 positions shown · IV contrast (multihance)
Comparison: [DATE]

CLINICAL DATA: Complex left renal lesion on MRI lumbar spine from
[DATE], for further characterization

EXAM:
MRI ABDOMEN WITHOUT AND WITH CONTRAST
TECHNIQUE: Multiplanar multisequence MR imaging of the abdomen was performed
both before and after the administration of intravenous contrast.
CONTRAST:  13mL MULTIHANCE GADOBENATE DIMEGLUMINE 529 MG/ML IV SOLN

[Series 4: T2 · coronal · 5.0mm · 1.56mm/px · 1 of 36 slices shown (1 of 3)]
[im 1/36]
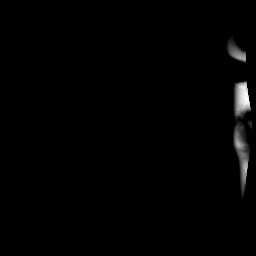

[Series 5: T1 · axial · 3.0mm · 1.06mm/px · z∈[-117,+96]mm · 6 of 144 slices shown]
[im 1/144]
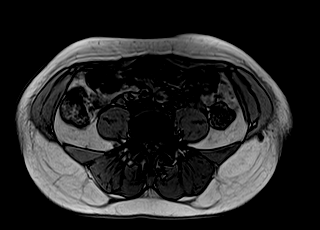
[im 29/144]
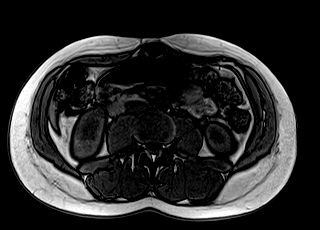
[im 58/144]
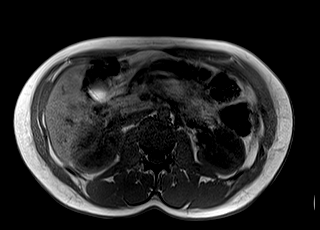
[im 86/144]
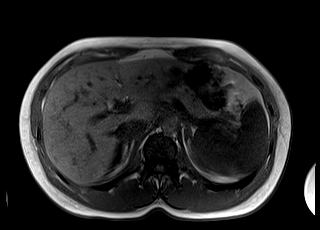
[im 115/144]
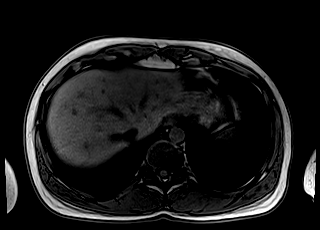
[im 144/144]
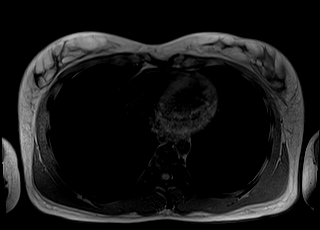

[Series 6: T2 · axial · 5.0mm · 1.48mm/px · z∈[-83,+115]mm · 2 of 34 slices shown (2 of 3)]
[im 1/34]
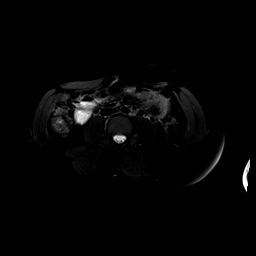
[im 34/34]
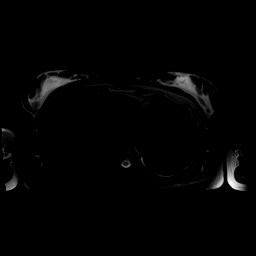

[Series 7: DWI · axial · 5.0mm · 1.42mm/px · z∈[-103,+107]mm · 5 of 108 slices shown (1 of 2)]
[im 1/108]
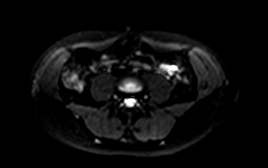
[im 27/108]
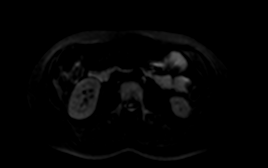
[im 54/108]
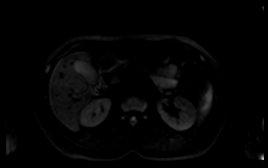
[im 81/108]
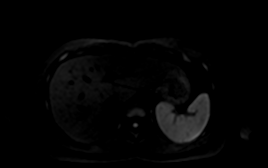
[im 108/108]
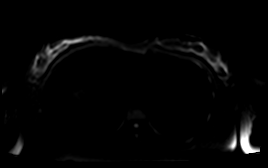

[Series 8: DWI · axial · 5.0mm · 1.42mm/px · z∈[-103,+107]mm · 2 of 36 slices shown (2 of 2)]
[im 1/36]
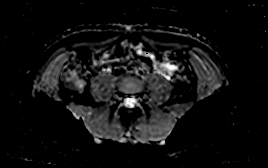
[im 36/36]
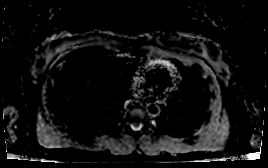

[Series 9: T2 · axial · 5.0mm · 1.25mm/px · 1 of 26 slices shown (3 of 3)]
[im 1/26]
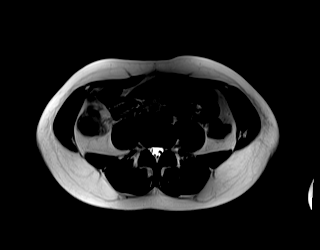

[Series 10: bSSFP · axial · 5.0mm · 1.25mm/px · 1 of 26 slices shown]
[im 1/26]
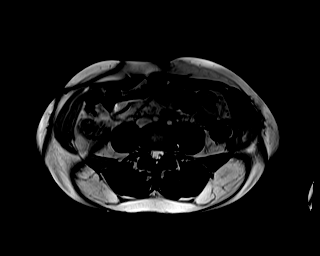

[Series 11: T1 dynamic · axial · non-contrast · 3.0mm · 1.25mm/px · z∈[-120,+45]mm · 3 of 56 slices shown]
[im 1/56]
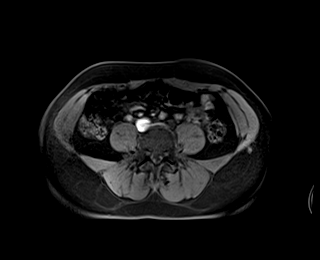
[im 28/56]
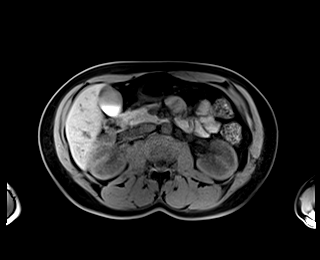
[im 56/56]
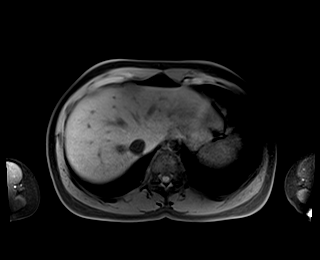

[Series 12: T1 dynamic post-contrast · axial · 3.0mm · 1.25mm/px · z∈[-120,+45]mm · 3 of 56 slices shown (1 of 9)]
[im 1/56]
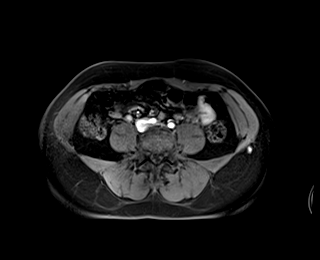
[im 28/56]
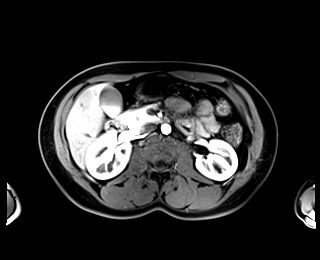
[im 56/56]
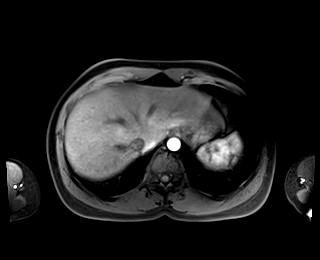

[Series 13: T1 dynamic post-contrast · axial · 3.0mm · 1.25mm/px · z∈[-120,+45]mm · 3 of 56 slices shown (2 of 9)]
[im 1/56]
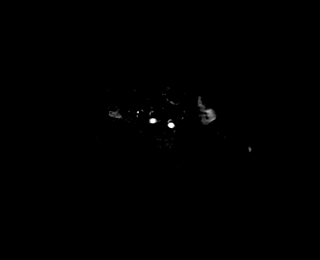
[im 28/56]
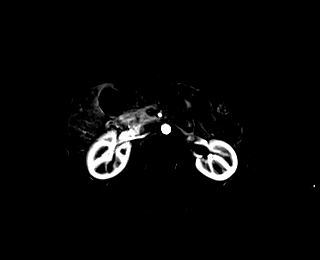
[im 56/56]
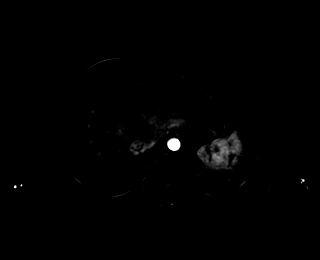

[Series 14: T1 dynamic post-contrast · axial · 3.0mm · 1.25mm/px · z∈[-120,+45]mm · 3 of 56 slices shown (3 of 9)]
[im 1/56]
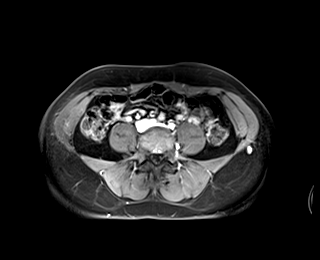
[im 28/56]
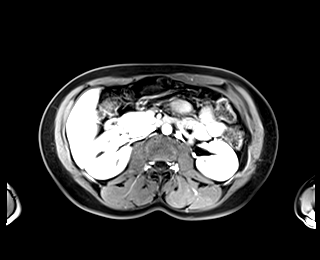
[im 56/56]
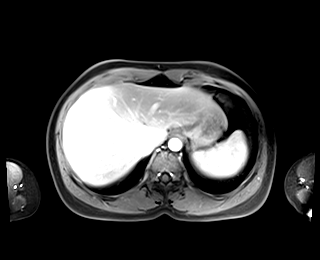

[Series 15: T1 dynamic post-contrast · axial · 3.0mm · 1.25mm/px · z∈[-120,+45]mm · 3 of 56 slices shown (4 of 9)]
[im 1/56]
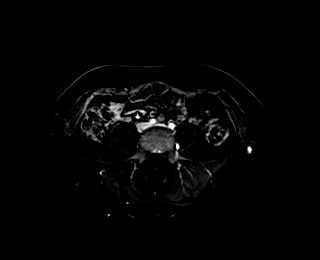
[im 28/56]
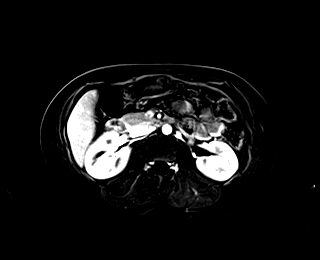
[im 56/56]
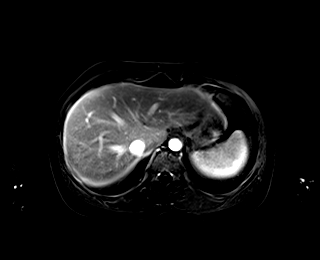

[Series 16: T1 dynamic post-contrast · axial · 3.0mm · 1.25mm/px · z∈[-120,+45]mm · 3 of 56 slices shown (5 of 9)]
[im 1/56]
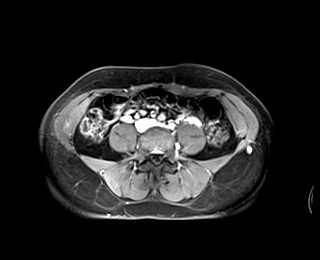
[im 28/56]
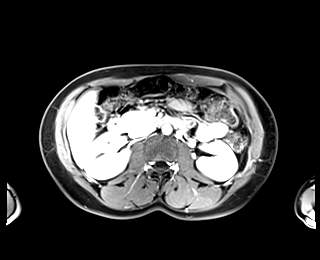
[im 56/56]
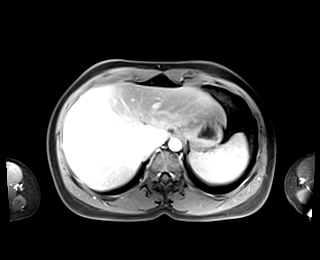

[Series 17: T1 dynamic post-contrast · axial · 3.0mm · 1.25mm/px · z∈[-120,+45]mm · 3 of 56 slices shown (6 of 9)]
[im 1/56]
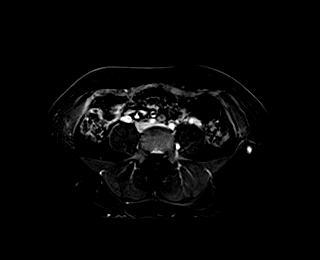
[im 28/56]
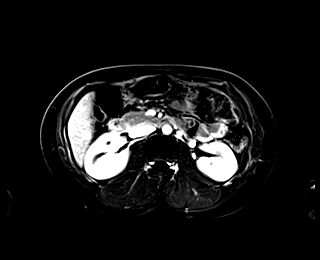
[im 56/56]
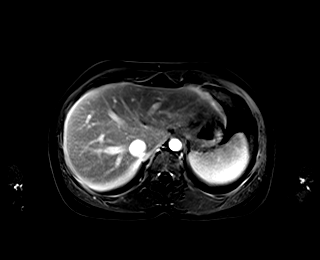

[Series 18: T1 dynamic post-contrast · coronal · 3.0mm · 1.25mm/px · 3 of 64 slices shown (7 of 9)]
[im 1/64]
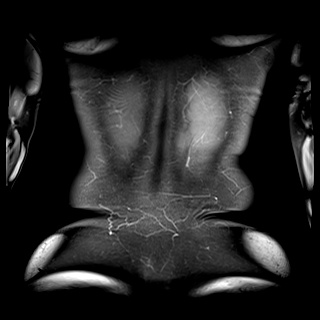
[im 32/64]
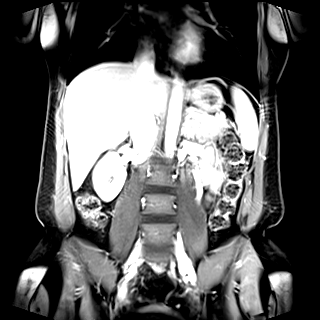
[im 64/64]
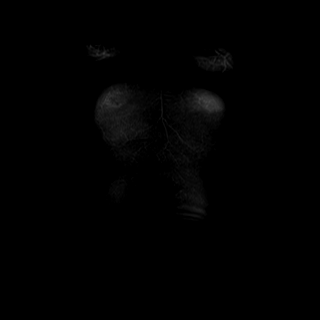

[Series 19: T1 dynamic post-contrast · axial · 3.0mm · 1.25mm/px · z∈[-120,+45]mm · 3 of 56 slices shown (8 of 9)]
[im 1/56]
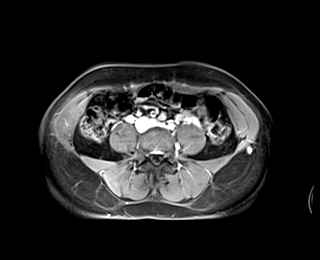
[im 28/56]
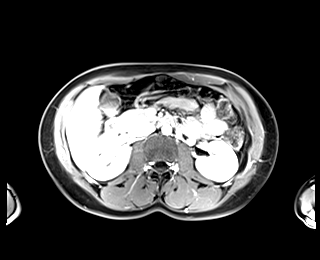
[im 56/56]
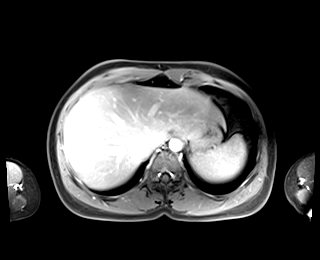

[Series 20: T1 dynamic post-contrast · axial · 3.0mm · 1.25mm/px · z∈[-120,+45]mm · 3 of 56 slices shown (9 of 9)]
[im 1/56]
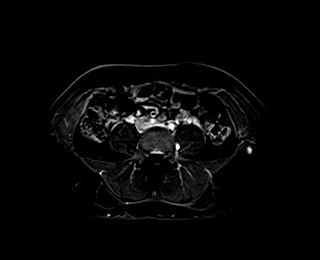
[im 28/56]
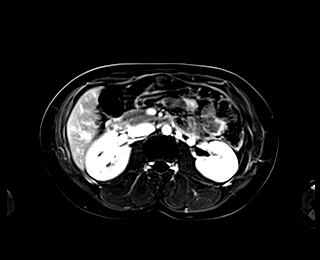
[im 56/56]
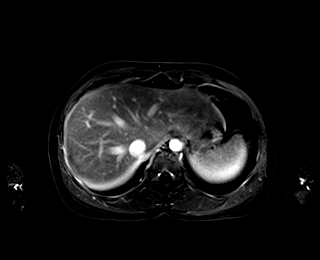

[48 of 48 positions shown; findings below may reference images not displayed]

FINDINGS: Lower chest: Unremarkable

Hepatobiliary: Dependent low T2 signal in the gallbladder
potentially from sludge. No biliary dilatation. 3 mm benign cyst in
the right hepatic lobe on image 11 series 14.

Pancreas:  Unremarkable

Spleen:  Unremarkable

Adrenals/Urinary Tract:  Both adrenal glands appear normal.

The lesion of concern in the left mid kidney measures 0.4 by 0.4 cm
on image 53 of series [DATE] and has high precontrast T1 signal
characteristics and low T2 signal characteristics. No appreciable
enhancement on subtraction imaging, strongly favoring a very small
Bosniak category 2 cyst, although admittedly the lesion is difficult
to characterize based on its very small size. There is also an
adjacent 0.6 cm Bosniak category 1 cyst in the left mid kidney.

No other renal lesions are observed.

Stomach/Bowel: Unremarkable

Vascular/Lymphatic:  Unremarkable

Other:  No supplemental non-categorized findings.

Musculoskeletal: Unremarkable
IMPRESSION: 1. A 4 mm lesion in the left mid kidney has high T1 and low T2
signal characteristics without appreciable enhancement, most
compatible with a small Bosniak category 2 cyst. Mildly reduced exam
specificity due to the very small size of this lesion. In the
absence of hematuria or other specific renal concerns this can
likely be considered benign.
2. There is also a 6 mm simple left mid kidney cyst observed.

## 2020-12-06 MED ORDER — GADOBENATE DIMEGLUMINE 529 MG/ML IV SOLN
13.0000 mL | Freq: Once | INTRAVENOUS | Status: AC | PRN
  Administered 2020-12-06: 13 mL via INTRAVENOUS

## 2020-12-08 ENCOUNTER — Other Ambulatory Visit: Payer: Self-pay

## 2020-12-08 MED ORDER — HYDROCODONE-ACETAMINOPHEN 5-325 MG PO TABS: 1.0000 | ORAL_TABLET | ORAL | 0 refills | Status: DC | PRN

## 2020-12-15 NOTE — Progress Notes (Signed)
Subjective:    Tasha Wu - 30 y.o. female MRN [id number]  Date of birth: [birth year]  HPI  Tasha Wu is to establish care.   Current issues and/or concerns: Reports followed by Orthopedics for left hip and left leg pain. Followed by [facility] for back pain and knee pain. [facility] and has second appointment later today. Seen by Neurosurgery in the past as well.   Reports ongoing history of back pain, left hip pain, and left leg pain. Has tried several medications and treatment regimens such as but not limited to Hydrocodone and steroid injections. Reports does not prefer Hydrocodone as she has a 6 month infant and a 76 year old and cannot be drowsy while caring for them. Reports steroid injections helped temporarily.   Currently employed at the [employer] but thinks will be fired soon due to extensive time out of work. Considering if she will quit the job before then. Has looked into the union with her job to see if she can get approved for disability. Reports has used FMLA since October 2021 and throughout pregnancy. Developed a pulmonary embolism which doctors suspected was prior to her pregnancy because of the increased size. Was taking blood thinners at that time. Delivered baby April 2022. Returned to work June 2022. In July 2022 symptoms began to worsen again. Concern for something more causing pain possibly diabetes. Reports family history of hyperthyroidism and multiple sclerosis. Denies chest pain and shortness of breath.  Would like eye exam. Reports seeing a purple spot out of right eye.   ROS per HPI    Health Maintenance: Health Maintenance Due  Topic Date Due   COVID-19 Vaccine (1) Never done   Pneumococcal Vaccine 3-43 Years old (1 - PCV) Never done   PAP SMEAR-Modifier  11/29/2014   INFLUENZA VACCINE  Never done    Past Medical History: Patient Active Problem List   Diagnosis Date Noted   SVD  (spontaneous vaginal delivery) 06/25/2020   Normal postpartum course 06/25/2020   Anemia affecting pregnancy in third trimester 06/25/2020   Encounter for induction of labor 06/23/2020   Bilateral pulmonary embolism (HCC) 12/04/2019   High-risk pregnancy in first trimester    Anemia 12/21/2017   Sickle cell trait (HCC) 07/21/2017   Anxiety 10/03/2016   Gastroesophageal reflux disease 10/03/2016   Abnormal Pap smear 12/10/2011   Umbilical hernia 11/29/2011   H/O pyelonephritis 11/29/2011    Social History   reports that she quit smoking about 9 years ago. Her smoking use included cigarettes. She has a 1.25 pack-year smoking history. She has never used smokeless tobacco. She reports that she does not currently use alcohol. She reports that she does not use drugs.   Family History  family history includes Anemia in her mother; Arthritis in her paternal grandmother; Asthma in her sister; Diabetes in her maternal aunt and paternal grandfather; Diabetes type I in her cousin; Heart disease in her maternal aunt; Hernia in her maternal aunt; Hypertension in her maternal uncle and mother; Hyperthyroidism in her paternal grandmother; Migraines in her father; Other in her father; Scoliosis in her paternal grandmother; Sickle cell anemia in her maternal aunt and maternal grandmother; Sickle cell trait in her brother and mother.   Medications: reviewed and updated   Objective:   Physical Exam BP 116/82 (BP Location: Left Arm, Patient Position: Sitting, Cuff Size: Normal)   Pulse 73   Resp 16   Ht 5\' 7"  (1.702 m)  Wt 147 lb (66.7 kg)   LMP 12/19/2020   SpO2 98%   Breastfeeding No   BMI 23.02 kg/m   Physical Exam HENT:     Head: Normocephalic and atraumatic.  Eyes:     Extraocular Movements: Extraocular movements intact.     Conjunctiva/sclera: Conjunctivae normal.     Pupils: Pupils are equal, round, and reactive to light.  Cardiovascular:     Rate and Rhythm: Normal rate and regular  rhythm.     Pulses: Normal pulses.     Heart sounds: Normal heart sounds.  Pulmonary:     Effort: Pulmonary effort is normal.     Breath sounds: Normal breath sounds.  Musculoskeletal:     Cervical back: Normal range of motion and neck supple.  Neurological:     General: No focal deficit present.     Mental Status: She is alert and oriented to person, place, and time.  Psychiatric:        Mood and Affect: Mood normal.        Behavior: Behavior normal.       Assessment & Plan:  1. Encounter to establish care: - Patient presents today to establish care.  - Return for annual physical examination, labs, and health maintenance. Arrive fasting meaning having no food for at least 8 hours prior to appointment. You may have only water or black coffee. Please take scheduled medications as normal.  2. Pain in left hip: 3. Lumbar radiculopathy: 4. Muscle weakness: 5. Cramp and spasm: - Keep all scheduled appointments with Orthopedics.  - Keep all scheduled appointments with [facility].  - Keep all scheduled appointments with [facility]. - Referral to Rheumatology for further evaluation and management.  - Follow-up with primary provider as scheduled. - Ambulatory referral to Rheumatology  6. Routine eye exam: - Referral to Ophthalmology for further evaluation and management.  - Ambulatory referral to Ophthalmology     Patient was given clear instructions to go to Emergency Department or return to medical center if symptoms don't improve, worsen, or new problems develop.The patient verbalized understanding.  I discussed the assessment and treatment plan with the patient. The patient was provided an opportunity to ask questions and all were answered. The patient agreed with the plan and demonstrated an understanding of the instructions.   The patient was advised to call back or seek an in-person evaluation if the symptoms worsen  or if the condition fails to improve as anticipated.    Ricky Stabs, NP 12/21/2020, 12:47 PM Primary Care at [facility]

## 2020-12-21 ENCOUNTER — Ambulatory Visit: Payer: POS | Attending: Neurosurgery | Admitting: Physical Therapy

## 2020-12-21 ENCOUNTER — Ambulatory Visit (INDEPENDENT_AMBULATORY_CARE_PROVIDER_SITE_OTHER): Payer: POS | Admitting: Family

## 2020-12-21 ENCOUNTER — Encounter: Payer: Self-pay | Admitting: Physical Therapy

## 2020-12-21 ENCOUNTER — Encounter: Payer: Self-pay | Admitting: Family

## 2020-12-21 ENCOUNTER — Other Ambulatory Visit: Payer: Self-pay

## 2020-12-21 VITALS — BP 116/82 | HR 73 | Resp 16 | Ht 67.0 in | Wt 147.0 lb

## 2020-12-21 DIAGNOSIS — R252 Cramp and spasm: Secondary | ICD-10-CM

## 2020-12-21 DIAGNOSIS — M6281 Muscle weakness (generalized): Secondary | ICD-10-CM

## 2020-12-21 DIAGNOSIS — Z0100 Encounter for examination of eyes and vision without abnormal findings: Secondary | ICD-10-CM

## 2020-12-21 DIAGNOSIS — M25552 Pain in left hip: Secondary | ICD-10-CM | POA: Diagnosis not present

## 2020-12-21 DIAGNOSIS — M5416 Radiculopathy, lumbar region: Secondary | ICD-10-CM

## 2020-12-21 DIAGNOSIS — Z7689 Persons encountering health services in other specified circumstances: Secondary | ICD-10-CM

## 2020-12-21 NOTE — Therapy (Signed)
[place]. [place], [place], [address] Phone: [phone]   Fax:  [phone]  Physical Therapy Treatment  Patient Details  Name: Tasha Wu MRN: [id number] Date of Birth: [birth year] Referring Provider (PT): Consuella Lose MD   Encounter Date: 12/21/2020   PT End of Session - 12/21/20 1432     Visit Number 2    Number of Visits 12    Date for PT Re-Evaluation 01/17/21    Authorization Type [employer] with [employer] MCD secondary (needs approval after 12 visit)    Authorization - Number of Visits 12    PT Start Time 1432    PT Stop Time 1517    PT Time Calculation (min) 45 min    Activity Tolerance Patient tolerated treatment well    Behavior During Therapy [facility] for tasks assessed/performed             Past Medical History:  Diagnosis Date   Abnormal Pap smear 12/10/2011   Anemia    Anxiety 2010   anxiety attack;no meds;has not been dx'd   Emotional instability ([place])    Break down easily when things are not going her way   GERD (gastroesophageal reflux disease)    History of pulmonary embolus during pregnancy    Hx of joint problems    Shoulders and knees have pain   Infection    Yeast;would get frequently   Infection    BV x 1   Kidney infection 2010   PO antibxs   Kidney stone complicating pregnancy    Migraines    no rx'd meds in the past;goes to sleep   PCOS (polycystic ovarian syndrome)    Sickle cell trait ([place])    Sickle cell trait ([place])    Umbilical hernia 3570   Does not cause anyp problems   Vaginal Pap smear, abnormal     Past Surgical History:  Procedure Laterality Date   DILATION AND CURETTAGE OF UTERUS  1779   No complications   IR ANGIOGRAM SELECTIVE EACH ADDITIONAL VESSEL  12/05/2019   IR ANGIOGRAM SELECTIVE EACH ADDITIONAL VESSEL  12/05/2019   IR INFUSION THROMBOL ARTERIAL INITIAL (MS)  12/05/2019   IR THROMB F/U EVAL ART/VEN FINAL DAY (MS)  12/06/2019   IR US GUIDE  VASC ACCESS LEFT  12/05/2019   IR US GUIDE VASC ACCESS LEFT  12/05/2019   WISDOM TOOTH EXTRACTION      There were no vitals filed for this visit.   Subjective Assessment - 12/21/20 1433     Subjective I've been working on my posture. Once I'm out of the house going to the grocery store or taking kids to school pain starts to get worse.  And once it gets going its hard to calm down.    Pertinent History DVTs during pregnancy only, not on blood thinners anymore. Recently retested and neg for clots.; anemia, anxiety    Diagnostic tests xrays negative; MRI: Small central to left subarticular disc protrusion with slight  inferior migration at L5-S1, closely approximating and potentially  irritating the descending left S1 nerve root.    Patient Stated Goals to get rid of pain and RTW    Currently in Pain? Yes    Pain Score 6     Pain Location Hip    Pain Orientation Left    Pain Descriptors / Indicators Aching    Aggravating Factors  walking and standing in the shower  [place] Adult PT Treatment/Exercise - 12/21/20 0001       Self-Care   Self-Care ADL's    ADL's Verbally reviewed pt's use of ADL modifications given at first visit. Also discussed need to address pain as soon as she gets it, vs waiting until end of day when it is really bad. Pt still demonstrating incorrect sit<>supine transfers.      Exercises   Exercises Lumbar      Lumbar Exercises: Stretches   Standing Extension 5 reps    Standing Extension Limitations with overpressure by pt on Lt L5/S1    Prone on Elbows Stretch 2 reps;20 seconds    Press Ups 5 reps    Press Ups Limitations with overpressure on Lt L5/S1   pt reports relief     Lumbar Exercises: Seated   Other Seated Lumbar Exercises lumbar flex/ext x 5      Lumbar Exercises: Supine   Ab Set 5 reps    AB Set Limitations 5 sec    Clam 10 reps    Heel Slides 10 reps    Bent Knee Raise 10 reps    Bent Knee Raise  Limitations left raise causes pain in tailbone      Lumbar Exercises: Prone   Other Prone Lumbar Exercises prone lying      Manual Therapy   Manual Therapy Joint mobilization    Joint Mobilization PA mobs to Lt L5/S1 Gd IV; pt reports relief of sx                     PT Education - 12/21/20 1528     Education Details HEP progressed with ab set heel slides and standing extension with overpressure; prone over pillow modified to prone lying    Person(s) Educated Patient    Methods Explanation;Demonstration;Handout;Verbal cues    Comprehension Verbalized understanding;Returned demonstration              PT Short Term Goals - 12/21/20 1533       PT SHORT TERM GOAL #1   Title ind with initial HEP    Status Achieved      PT SHORT TERM GOAL #2   Title Patient able to consistently centralize LE sx to low back with positioning and/or exercises.    Status Partially Met               PT Long Term Goals - 11/22/20 1504       PT LONG TERM GOAL #1   Title Patient Ind with proper body mechanics for lifting and ADLs to prevent further injury.    Time 8    Period Weeks    Status New    Target Date 01/17/21      PT LONG TERM GOAL #2   Title Decreased pain with ADLS by >=25% to allow RTW.    Time 8    Period Weeks    Status New      PT LONG TERM GOAL #3   Title Patient to demonstrate improved core strength by doing a plank for 1 min, side planks x 30 sec and stabilizing trunk with seated MMT of LEs.    Time 8    Period Weeks    Status New      PT LONG TERM GOAL #4   Title Patient able to lift and carry a minimum of 40# without increased sx to allow RTW    Time 8    Period Weeks  Status New      PT LONG TERM GOAL #5   Title Patient able to sleep without waking from pain    Time 8    Period Weeks    Status New      Additional Long Term Goals   Additional Long Term Goals Yes      PT LONG TERM GOAL #6   Title Improved tolerance to standing and  walking to >=45 min    Time 8    Period Weeks    Status New                   Plan - 12/21/20 1529     Clinical Impression Statement Pt returns for first f/u visit with good response to postural and ADLs modifications. She is still getting almost immediate pain in left hip when standing, but leg pain is taking longer to onset. Patient tolerated transverse abdominus exercises fairly well except for bent knee raise on left which caused pain in her tailbone. Sx were abolished with Lt PA mobs to L5/S1. Pt advised to lie prone instead of over pillow now that she can tolerate it and progress to prone on elbows and extension as tolerated. Trial of traction next visit.    Comorbidities DVTs during pregnancy only, not on blood thinners anymore. Recently retested and neg for clots.; anemia, anxiety    PT Frequency 2x / week    PT Duration 8 weeks    PT Treatment/Interventions ADLs/Self Care Home Management;Cryotherapy;Electrical Stimulation;Moist Heat;Traction;Therapeutic activities;Therapeutic exercise;Neuromuscular re-education;Patient/family education;Manual techniques;Dry needling;Spinal Manipulations    PT Next Visit Plan progress to neutral spine/extension bias as tol; trial of mechanical traction; core and lumbar stab    PT Home Exercise Plan [email or web address]    Consulted and Agree with Plan of Care Patient             Patient will benefit from skilled therapeutic intervention in order to improve the following deficits and impairments:  Pain, Increased muscle spasms, Improper body mechanics, Impaired flexibility, Decreased strength, Postural dysfunction  Visit Diagnosis: Radiculopathy, lumbar region  Muscle weakness (generalized)  Cramp and spasm     Problem List Patient Active Problem List   Diagnosis Date Noted   SVD (spontaneous vaginal delivery) 06/25/2020   Normal postpartum course 06/25/2020   Anemia affecting pregnancy in third trimester 06/25/2020   Encounter for  induction of labor 06/23/2020   Bilateral pulmonary embolism ([place]) 12/04/2019   High-risk pregnancy in first trimester    Anemia 12/21/2017   Sickle cell trait ([place]) 07/21/2017   Anxiety 10/03/2016   Gastroesophageal reflux disease 10/03/2016   Abnormal Pap smear 55/73/2202   Umbilical hernia 54/27/0623   H/O pyelonephritis 11/29/2011    Madelyn Flavors, PT 12/21/2020, 3:37 PM  [place]. [place], [place], [address] Phone: [phone]   Fax:  [phone]  Name: Tasha Wu MRN: [id number] Date of Birth: [birth year]

## 2020-12-21 NOTE — Progress Notes (Signed)
Left leg pain ongoing pain duration with pregnancy  F/u blood clots and PE

## 2020-12-21 NOTE — Patient Instructions (Signed)
Access Code: [email or web address] URL: [email or web address] Date: 12/21/2020 Prepared by: Raynelle Fanning  Exercises Lying Prone with 1 Pillow - 4-5 x daily - 7 x weekly - 1 sets - 1 reps - 5 min hold Seated Piriformis Stretch - 2 x daily - 7 x weekly - 1 sets - 3 reps - 20-30 sec hold Supine Transversus Abdominis Bracing with Heel Slide - 2 x daily - 7 x weekly - 1-2 sets - 10 reps Standing Lumbar Extension - 4 x daily - 7 x weekly - 1 sets - 10 reps  Patient Education Healthy Posture: How to Hold and Lift a Firefighter

## 2020-12-21 NOTE — Patient Instructions (Addendum)
Thank you for choosing Primary Care at [facility] for your medical home!    Tasha Wu was seen by Rema Fendt, NP today.   Tasha Wu's primary care provider is [facility] Jodi Geralds, NP.   For the best care possible,  you should try to see Tasha Stabs, NP whenever you come to clinic.   We look forward to seeing you again soon!  If you have any questions about your visit today,  please call us at [phone]  Or feel free to reach your provider via MyChart.    Keeping you healthy   Get these tests Blood pressure- Have your blood pressure checked once a year by your healthcare provider.  Normal blood pressure is 120/80. Weight- Have your body mass index (BMI) calculated to screen for obesity.  BMI is a measure of body fat based on height and weight. You can also calculate your own BMI at [email or web address]://www.west-esparza.com/. Cholesterol- Have your cholesterol checked regularly starting at age 33, sooner may be necessary if you have diabetes, high blood pressure, if a family member developed heart diseases at an early age or if you smoke.  Chlamydia, HIV, and other sexual transmitted disease- Get screened each year until the age of 72 then within three months of each new sexual partner. Diabetes- Have your blood sugar checked regularly if you have high blood pressure, high cholesterol, a family history of diabetes or if you are overweight.   Get these vaccines Flu shot- Every fall. Tetanus shot- Every 10 years. Menactra- Single dose; prevents meningitis.   Take these steps Don't smoke- If you do smoke, ask your healthcare provider about quitting. For tips on how to quit, go to www.smokefree.gov or call 1-800-QUIT-NOW. Be physically active- Exercise 5 days a week for at least 30 minutes.  If you are not already physically active start slow and gradually work up to 30 minutes of moderate physical activity.  Examples of moderate activity include walking briskly, mowing the yard, dancing,  swimming bicycling, etc. Eat a healthy diet- Eat a variety of healthy foods such as fruits, vegetables, low fat milk, low fat cheese, yogurt, lean meats, poultry, fish, beans, tofu, etc.  For more information on healthy eating, go to www.thenutritionsource.org Drink alcohol in moderation- Limit alcohol intake two drinks or less a day.  Never drink and drive. Dentist- Brush and floss teeth twice daily; visit your dentis twice a year. Depression-Your emotional health is as important as your physical health.  If you're feeling down, losing interest in things you normally enjoy please talk with your healthcare provider. Gun Safety- If you keep a gun in your home, keep it unloaded and with the safety lock on.  Bullets should be stored separately. Helmet use- Always wear a helmet when riding a motorcycle, bicycle, rollerblading or skateboarding. Safe sex- If you may be exposed to a sexually transmitted infection, use a condom Seat belts- Seat bels can save your life; always wear one. Smoke/Carbon Monoxide detectors- These detectors need to be installed on the appropriate level of your home.  Replace batteries at least once a year. Skin Cancer- When out in the sun, cover up and use sunscreen SPF 15 or higher. Violence- If anyone is threatening or hurting you, please tell your healthcare provider.

## 2020-12-28 ENCOUNTER — Ambulatory Visit: Payer: POS | Admitting: Physical Therapy

## 2020-12-30 ENCOUNTER — Ambulatory Visit: Payer: POS | Admitting: Physical Therapy

## 2021-01-03 ENCOUNTER — Encounter: Payer: Self-pay | Admitting: Orthopaedic Surgery

## 2021-01-04 ENCOUNTER — Telehealth: Payer: POS | Admitting: Physician Assistant

## 2021-01-04 DIAGNOSIS — R3989 Other symptoms and signs involving the genitourinary system: Secondary | ICD-10-CM | POA: Diagnosis not present

## 2021-01-04 MED ORDER — CEPHALEXIN 500 MG PO CAPS: 500.0000 mg | ORAL_CAPSULE | Freq: Two times a day (BID) | ORAL | 0 refills | Status: DC

## 2021-01-04 NOTE — Telephone Encounter (Signed)
Sure - that's fine

## 2021-01-04 NOTE — Progress Notes (Signed)

## 2021-01-10 ENCOUNTER — Encounter: Payer: Self-pay | Admitting: Family

## 2021-01-12 ENCOUNTER — Telehealth: Payer: POS | Admitting: Physician Assistant

## 2021-01-12 DIAGNOSIS — T3695XA Adverse effect of unspecified systemic antibiotic, initial encounter: Secondary | ICD-10-CM | POA: Diagnosis not present

## 2021-01-12 DIAGNOSIS — B379 Candidiasis, unspecified: Secondary | ICD-10-CM

## 2021-01-12 MED ORDER — FLUCONAZOLE 150 MG PO TABS: 150.0000 mg | ORAL_TABLET | Freq: Once | ORAL | 0 refills | Status: AC

## 2021-01-12 NOTE — Progress Notes (Signed)

## 2021-02-02 NOTE — Progress Notes (Signed)
Patient ID: Tasha Wu, female    DOB: 06-03-1990  MRN: [id number]  CC: Annual Physical Exam  Subjective: Tasha Wu is a 30 y.o. female who presents for annual physical exam.   Her concerns today include:   1. Pain in left hip follow-up: 2. Lumbar radiculopathy follow-up: 3. Muscle weakness follow-up: 4. Cramp and spasm follow-up: 12/21/2020: - Keep all scheduled appointments with Orthopedics.  - Keep all scheduled appointments with [place] all scheduled appointments with [place]. - Referral to Rheumatology for further evaluation and management.  - Follow-up with primary provider as scheduled. - Ambulatory referral to Rheumatology  02/06/2021: Pain persisting and taking pain medication to help. Has not heard from Rheumatology referral. Physical therapy not helping. Has not seen Orthopedics recently. Applied for disability in August 2022 and has not received update from them as of present. Currently scheduled to return to work February 15, 2021. Presents today with paperwork to be completed per request of her employer, due end of December 2022.   Depression screen [facility] 2/9 02/06/2021 12/21/2020 01/26/2020 12/15/2019 12/09/2018  Decreased Interest 0 0 0 0 0  Down, Depressed, Hopeless 0 0 0 0 0  PHQ - 2 Score 0 0 0 0 0  Altered sleeping 0 3 - - -  Tired, decreased energy 0 0 - - -  Change in appetite 0 0 - - -  Feeling bad or failure about yourself  0 0 - - -  Trouble concentrating 0 0 - - -  Moving slowly or fidgety/restless 0 0 - - -  Suicidal thoughts 0 0 - - -  PHQ-9 Score 0 3 - - -  Difficult doing work/chores - - - - -     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   Peripheral neuropathy 09/11/2020   SVD (spontaneous vaginal delivery)  06/25/2020   Normal postpartum course 06/25/2020   Anemia affecting pregnancy in third trimester 06/25/2020   Encounter for induction of labor 06/23/2020   Anemia of pregnancy 04/14/2020   History of pulmonary embolus (PE) 01/15/2020   Pregnant 12/23/2019   Bilateral pulmonary embolism ([place]) 12/04/2019   High-risk pregnancy in first trimester    Anemia 12/21/2017   Atypical squamous cells of undetermined significance (ASC-US) on cervical Pap smear 08/19/2017   Pyelonephritis 07/22/2017   Sickle cell trait ([place]) 07/21/2017   Anxiety 10/03/2016   Gastroesophageal reflux disease 10/03/2016   Migraine with aura 10/03/2016   Polycystic ovaries 10/03/2016   Post-term pregnancy 06/18/2012   Abnormal Pap smear 26/83/4196   Umbilical hernia 22/29/7989   H/O pyelonephritis 11/29/2011     Current Outpatient Medications on File Prior to Visit  Medication Sig Dispense Refill   HYDROcodone-acetaminophen (NORCO) 5-325 MG tablet Take 1 tablet by mouth every 4 (four) hours as needed for moderate pain. 30 tablet 0   iron polysaccharides (NIFEREX) 150 MG capsule Take 1 capsule (150 mg total) by mouth daily. 30 capsule 1   No current facility-administered medications on file prior to visit.    No Known Allergies  Social History   Socioeconomic History   Marital status: Single    Spouse name: Not on file   Number of children: 2   Years of education: 15   Highest education level: Not on file  Occupational History  Not on file  Tobacco Use   Smoking status: Former    Packs/day: 0.25    Years: 5.00    Pack years: 1.25    Types: Cigarettes    Quit date: 11/18/2011    Years since quitting: 9.2   Smokeless tobacco: Never  Vaping Use   Vaping Use: Never used  Substance and Sexual Activity   Alcohol use: Not Currently    Comment: occ   Drug use: No   Sexual activity: Yes    Partners: Male    Birth control/protection: None  Other Topics Concern   Not on file  Social History Narrative    Not on file   Social Determinants of Health   Financial Resource Strain: Not on file  Food Insecurity: Not on file  Transportation Needs: Not on file  Physical Activity: Not on file  Stress: Not on file  Social Connections: Not on file  Intimate Partner Violence: Not on file    Family History  Problem Relation Age of Onset   Sickle cell trait Mother    Hypertension Mother    Anemia Mother    Sickle cell trait Brother    Sickle cell anemia Maternal Aunt        Recently deceased from [place] crisis   Diabetes Maternal Aunt    Sickle cell anemia Maternal Grandmother        Deceased   Heart disease Maternal Aunt    Asthma Sister        1/2 sibling;father's child   Hyperthyroidism Paternal Grandmother    Scoliosis Paternal Grandmother    Arthritis Paternal Grandmother    Migraines Father    Other Father        Joint Problems;shoulders and knees   Hernia Maternal Aunt        Umbilical   Hypertension Maternal Uncle    Diabetes type I Cousin        maternal   Diabetes Paternal Grandfather     Past Surgical History:  Procedure Laterality Date   DILATION AND CURETTAGE OF UTERUS  2951   No complications   IR ANGIOGRAM SELECTIVE EACH ADDITIONAL VESSEL  12/05/2019   IR ANGIOGRAM SELECTIVE EACH ADDITIONAL VESSEL  12/05/2019   IR INFUSION THROMBOL ARTERIAL INITIAL (MS)  12/05/2019   IR THROMB F/U EVAL ART/VEN FINAL DAY (MS)  12/06/2019   IR US GUIDE VASC ACCESS LEFT  12/05/2019   IR US GUIDE VASC ACCESS LEFT  12/05/2019   WISDOM TOOTH EXTRACTION      ROS: Review of Systems Negative except as stated above  PHYSICAL EXAM: BP 116/77 (BP Location: Left Arm, Patient Position: Sitting, Cuff Size: Normal)   Pulse 81   Temp 98.1 F (36.7 C)   Resp 18   Ht 5' 7.01" (1.702 m)   Wt 149 lb 12.8 oz (67.9 kg)   SpO2 98%   BMI 23.46 kg/m   Physical Exam HENT:     Head: Normocephalic and atraumatic.     Right Ear: Tympanic membrane, ear canal and external ear normal.     Left Ear:  Tympanic membrane, ear canal and external ear normal.  Eyes:     Extraocular Movements: Extraocular movements intact.     Conjunctiva/sclera: Conjunctivae normal.     Pupils: Pupils are equal, round, and reactive to light.  Cardiovascular:     Rate and Rhythm: Normal rate and regular rhythm.     Pulses: Normal pulses.     Heart sounds: Normal heart sounds.  Pulmonary:     Effort: Pulmonary effort is normal.     Breath sounds: Normal breath sounds.  Chest:     Comments: Patient declined exam. Genitourinary:    Comments: Patient declined exam.  Musculoskeletal:        General: Normal range of motion.     Cervical back: Normal range of motion and neck supple.  Skin:    General: Skin is warm and dry.     Capillary Refill: Capillary refill takes less than 2 seconds.  Neurological:     General: No focal deficit present.     Mental Status: She is alert and oriented to person, place, and time.  Psychiatric:        Mood and Affect: Mood normal.        Behavior: Behavior normal.   ASSESSMENT AND PLAN: 1. Annual physical exam: - Counseled on 150 minutes of exercise per week as tolerated, healthy eating (including decreased daily intake of saturated fats, cholesterol, added sugars, sodium), STI prevention, and routine healthcare maintenance.  2. Screening for metabolic disorder: - VZD63+OVFI to check kidney function, liver function, and electrolyte balance.  - CMP14+EGFR  3. Screening for deficiency anemia: - CBC to screen for anemia. - CBC  4. Diabetes mellitus screening: - Hemoglobin A1c to screen for pre-diabetes/diabetes. - Hemoglobin A1c  5. Screening cholesterol level: - Lipid panel to screen for high cholesterol.  - Lipid panel  6. Thyroid disorder screen: - TSH to check thyroid function.  - TSH  7. Cervical cancer screening: 8. Routine screening for STI (sexually transmitted infection): - Patient declined. Reports up to date and completed at [facility].  9. Pain in left hip: 10. Lumbar radiculopathy: 11. Muscle weakness: 12. Cramp and spasm: - Patient reports Physical Therapy not helping.  - Keep all scheduled appointments with [place] all scheduled appointments with [facility]. Patient declined second opinion referral as of present. Also, it was noted on 12/15/2020 per Tasha Lemon, MD if patient elects referral to Neurosurgery to notify him. - It seems the referral to Rheumatology was declined by Tasha Emerald, MD on 12/22/2020 stating no evidence of inflammatory disease specific for rheumatology.  - Follow-up with primary provider as scheduled.   13. Encounter for completion of form with patient: - Patient reports she applied for disability in August 2022 without an update as of present.  - Patient presents today with paperwork for completion per request of her employer. We will schedule patient for paperwork appointment for completion, patient agreeable.    Patient was given the opportunity to ask questions.  Patient verbalized understanding of the plan and was able to repeat key elements of the plan. Patient was given clear instructions to go to Emergency Department or return to medical center if symptoms don't improve, worsen, or new problems develop.The patient verbalized understanding.   Orders Placed This Encounter  Procedures   CBC   Lipid panel   TSH   CMP14+EGFR   Hemoglobin A1c    Return in about 1 year (around 02/06/2022) for Physical per patient preference.  Camillia Herter, NP

## 2021-02-06 ENCOUNTER — Other Ambulatory Visit: Payer: Self-pay

## 2021-02-06 ENCOUNTER — Encounter: Payer: Self-pay | Admitting: Family

## 2021-02-06 ENCOUNTER — Ambulatory Visit (INDEPENDENT_AMBULATORY_CARE_PROVIDER_SITE_OTHER): Payer: POS | Admitting: Family

## 2021-02-06 VITALS — BP 116/77 | HR 81 | Temp 98.1°F | Resp 18 | Ht 67.01 in | Wt 149.8 lb

## 2021-02-06 DIAGNOSIS — Z1329 Encounter for screening for other suspected endocrine disorder: Secondary | ICD-10-CM

## 2021-02-06 DIAGNOSIS — M5416 Radiculopathy, lumbar region: Secondary | ICD-10-CM

## 2021-02-06 DIAGNOSIS — Z131 Encounter for screening for diabetes mellitus: Secondary | ICD-10-CM

## 2021-02-06 DIAGNOSIS — Z0001 Encounter for general adult medical examination with abnormal findings: Secondary | ICD-10-CM | POA: Diagnosis not present

## 2021-02-06 DIAGNOSIS — Z0289 Encounter for other administrative examinations: Secondary | ICD-10-CM

## 2021-02-06 DIAGNOSIS — N281 Cyst of kidney, acquired: Secondary | ICD-10-CM | POA: Insufficient documentation

## 2021-02-06 DIAGNOSIS — Z113 Encounter for screening for infections with a predominantly sexual mode of transmission: Secondary | ICD-10-CM

## 2021-02-06 DIAGNOSIS — Z13228 Encounter for screening for other metabolic disorders: Secondary | ICD-10-CM

## 2021-02-06 DIAGNOSIS — R252 Cramp and spasm: Secondary | ICD-10-CM

## 2021-02-06 DIAGNOSIS — M6281 Muscle weakness (generalized): Secondary | ICD-10-CM

## 2021-02-06 DIAGNOSIS — G8929 Other chronic pain: Secondary | ICD-10-CM | POA: Insufficient documentation

## 2021-02-06 DIAGNOSIS — Z124 Encounter for screening for malignant neoplasm of cervix: Secondary | ICD-10-CM

## 2021-02-06 DIAGNOSIS — Z0000 Encounter for general adult medical examination without abnormal findings: Secondary | ICD-10-CM

## 2021-02-06 DIAGNOSIS — M25552 Pain in left hip: Secondary | ICD-10-CM

## 2021-02-06 DIAGNOSIS — Z130 Encounter for screening for diseases of the blood and blood-forming organs and certain disorders involving the immune mechanism: Secondary | ICD-10-CM

## 2021-02-06 DIAGNOSIS — Z13220 Encounter for screening for lipoid disorders: Secondary | ICD-10-CM

## 2021-02-06 NOTE — Progress Notes (Signed)
Pt presents for annual physical exam declined pap goes to [facility]

## 2021-02-07 ENCOUNTER — Encounter: Payer: Self-pay | Admitting: Orthopaedic Surgery

## 2021-02-07 LAB — CMP14+EGFR
ALT: 6 IU/L (ref 0–32)
AST: 15 IU/L (ref 0–40)
Albumin/Globulin Ratio: 1.6 (ref 1.2–2.2)
Albumin: 4.5 g/dL (ref 3.8–4.8)
Alkaline Phosphatase: 70 IU/L (ref 44–121)
BUN/Creatinine Ratio: 14 (ref 9–23)
BUN: 13 mg/dL (ref 6–20)
Bilirubin Total: 0.5 mg/dL (ref 0.0–1.2)
CO2: 23 mmol/L (ref 20–29)
Calcium: 9.4 mg/dL (ref 8.7–10.2)
Chloride: 105 mmol/L (ref 96–106)
Creatinine, Ser: 0.95 mg/dL (ref 0.57–1.00)
Globulin, Total: 2.8 g/dL (ref 1.5–4.5)
Glucose: 83 mg/dL (ref 70–99)
Potassium: 4.2 mmol/L (ref 3.5–5.2)
Sodium: 142 mmol/L (ref 134–144)
Total Protein: 7.3 g/dL (ref 6.0–8.5)
eGFR: 82 mL/min/{1.73_m2} (ref >59)

## 2021-02-07 LAB — LIPID PANEL
Chol/HDL Ratio: 2.9 ratio (ref 0.0–4.4)
Cholesterol, Total: 152 mg/dL (ref 100–199)
HDL: 52 mg/dL (ref >39)
LDL Chol Calc (NIH): 87 mg/dL (ref 0–99)
Triglycerides: 63 mg/dL (ref 0–149)
VLDL Cholesterol Cal: 13 mg/dL (ref 5–40)

## 2021-02-07 LAB — CBC
Hematocrit: 37.3 % (ref 34.0–46.6)
Hemoglobin: 11.5 g/dL (ref 11.1–15.9)
MCH: 22.8 pg — ABNORMAL LOW (ref 26.6–33.0)
MCHC: 30.8 g/dL — ABNORMAL LOW (ref 31.5–35.7)
MCV: 74 fL — ABNORMAL LOW (ref 79–97)
Platelets: 273 10*3/uL (ref 150–450)
RBC: 5.04 x10E6/uL (ref 3.77–5.28)
RDW: 14.8 % (ref 11.7–15.4)
WBC: 4 10*3/uL (ref 3.4–10.8)

## 2021-02-07 LAB — HEMOGLOBIN A1C
Est. average glucose Bld gHb Est-mCnc: 111 mg/dL
Hgb A1c MFr Bld: 5.5 % (ref 4.8–5.6)

## 2021-02-07 LAB — TSH: TSH: 2.44 u[IU]/mL (ref 0.450–4.500)

## 2021-02-07 NOTE — Progress Notes (Signed)
Kidney function normal.   Liver function normal.   Thyroid function normal.   Cholesterol normal.   No diabetes.   Anemia improved since 7 months ago. Continue iron supplement as prescribed.

## 2021-02-08 ENCOUNTER — Other Ambulatory Visit: Payer: Self-pay

## 2021-02-08 DIAGNOSIS — M545 Low back pain, unspecified: Secondary | ICD-10-CM

## 2021-02-08 DIAGNOSIS — M25552 Pain in left hip: Secondary | ICD-10-CM

## 2021-02-10 NOTE — Progress Notes (Signed)
Patient ID: Brendalee Binion, female    DOB: Sep 28, 1990  MRN: [id number]  CC: Paperwork  Subjective: Yerlin Migdal is a 30 y.o. female who presents for paperwork.   Her concerns today include:   Patient presents today with request for completion of light duty paperwork completion from employer [employer] [employer].   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   Peripheral neuropathy 09/11/2020   SVD (spontaneous vaginal delivery) 06/25/2020   Normal postpartum course 06/25/2020   Anemia affecting pregnancy in third trimester 06/25/2020   Encounter for induction of labor 06/23/2020   Anemia of pregnancy 04/14/2020   History of pulmonary embolus (PE) 01/15/2020   Pregnant 12/23/2019   Bilateral pulmonary embolism ([place]) 12/04/2019   High-risk pregnancy in first trimester    Anemia 12/21/2017   Atypical squamous cells of undetermined significance (ASC-US) on cervical Pap smear 08/19/2017   Pyelonephritis 07/22/2017   Sickle cell trait ([place]) 07/21/2017   Anxiety 10/03/2016   Gastroesophageal reflux disease 10/03/2016   Migraine with aura 10/03/2016   Polycystic ovaries 10/03/2016   Post-term pregnancy 06/18/2012   Abnormal Pap smear 0000000   Umbilical hernia 99991111   H/O pyelonephritis 11/29/2011     Current Outpatient Medications on File Prior to Visit  Medication Sig Dispense Refill   HYDROcodone-acetaminophen (NORCO) 5-325 MG tablet Take 1 tablet by mouth every 4 (four) hours as needed for moderate pain. 30 tablet 0   iron polysaccharides (NIFEREX) 150 MG capsule Take 1 capsule (150 mg total) by mouth daily. 30 capsule 1   No current facility-administered medications on file prior to visit.    No Known Allergies  Social History   Socioeconomic History   Marital status: Significant Other    Spouse name:  Not on file   Number of children: 2   Years of education: 15   Highest education level: Not on file  Occupational History   Not on file  Tobacco Use   Smoking status: Former    Packs/day: 0.25    Years: 5.00    Pack years: 1.25    Types: Cigarettes    Quit date: 11/18/2011    Years since quitting: 9.2   Smokeless tobacco: Never  Vaping Use   Vaping Use: Never used  Substance and Sexual Activity   Alcohol use: Not Currently    Comment: occ   Drug use: No   Sexual activity: Yes    Partners: Male    Birth control/protection: None  Other Topics Concern   Not on file  Social History Narrative   Not on file   Social Determinants of Health   Financial Resource Strain: Not on file  Food Insecurity: Not on file  Transportation Needs: Not on file  Physical Activity: Not on file  Stress: Not on file  Social Connections: Not on file  Intimate Partner Violence: Not on file    Family History  Problem Relation Age of Onset   Sickle cell trait Mother    Hypertension Mother    Anemia Mother    Sickle cell trait Brother    Sickle cell anemia Maternal Aunt        Recently deceased from [place] crisis   Diabetes Maternal Aunt    Sickle cell anemia Maternal Grandmother        Deceased   Heart disease Maternal Aunt  Asthma Sister        1/2 sibling;father's child   Hyperthyroidism Paternal Grandmother    Scoliosis Paternal Grandmother    Arthritis Paternal Grandmother    Migraines Father    Other Father        Joint Problems;shoulders and knees   Hernia Maternal Aunt        Umbilical   Hypertension Maternal Uncle    Diabetes type I Cousin        maternal   Diabetes Paternal Grandfather     Past Surgical History:  Procedure Laterality Date   DILATION AND CURETTAGE OF UTERUS  2009   No complications   IR ANGIOGRAM SELECTIVE EACH ADDITIONAL VESSEL  12/05/2019   IR ANGIOGRAM SELECTIVE EACH ADDITIONAL VESSEL  12/05/2019   IR INFUSION THROMBOL ARTERIAL INITIAL (MS)  12/05/2019    IR THROMB F/U EVAL ART/VEN FINAL DAY (MS)  12/06/2019   IR US GUIDE VASC ACCESS LEFT  12/05/2019   IR US GUIDE VASC ACCESS LEFT  12/05/2019   WISDOM TOOTH EXTRACTION      ROS: Review of Systems Negative except as stated above  PHYSICAL EXAM: BP 109/71 (BP Location: Left Arm, Patient Position: Sitting, Cuff Size: Normal)   Pulse 72   Temp 98.9 F (37.2 C)   Resp 18   Ht 5' 7.01" (1.702 m)   Wt 151 lb (68.5 kg)   SpO2 100%   BMI 23.64 kg/m   Physical Exam HENT:     Head: Normocephalic.  Eyes:     Extraocular Movements: Extraocular movements intact.     Conjunctiva/sclera: Conjunctivae normal.     Pupils: Pupils are equal, round, and reactive to light.  Cardiovascular:     Rate and Rhythm: Normal rate and regular rhythm.     Pulses: Normal pulses.     Heart sounds: Normal heart sounds.  Pulmonary:     Effort: Pulmonary effort is normal.     Breath sounds: Normal breath sounds.  Musculoskeletal:     Cervical back: Normal range of motion and neck supple.  Neurological:     General: No focal deficit present.     Mental Status: She is alert and oriented to person, place, and time.  Psychiatric:        Mood and Affect: Mood normal.        Behavior: Behavior normal.   ASSESSMENT AND PLAN: 1. Encounter for completion of form with patient: - Completed light duty paperwork from patient's employer [employer]). Counseled patient that while we are putting light duty of all work-related activities of 0 hours patient may still need to complete FMLA in the future, patient verbalized understanding.  - Follow-up with primary provider as scheduled.     Patient was given the opportunity to ask questions.  Patient verbalized understanding of the plan and was able to repeat key elements of the plan. Patient was given clear instructions to go to Emergency Department or return to medical center if symptoms don't improve, worsen, or new problems develop.The patient  verbalized understanding.  Follow-up with primary provider as scheduled.  Rema Fendt, NP

## 2021-02-13 ENCOUNTER — Other Ambulatory Visit: Payer: Self-pay

## 2021-02-13 ENCOUNTER — Ambulatory Visit (INDEPENDENT_AMBULATORY_CARE_PROVIDER_SITE_OTHER): Payer: POS | Admitting: Family

## 2021-02-13 VITALS — BP 109/71 | HR 72 | Temp 98.9°F | Resp 18 | Ht 67.01 in | Wt 151.0 lb

## 2021-02-13 DIAGNOSIS — Z0289 Encounter for other administrative examinations: Secondary | ICD-10-CM

## 2021-02-13 NOTE — Progress Notes (Signed)
Pt presents for completion of medical certification paperwork

## 2021-02-21 ENCOUNTER — Telehealth: Payer: Self-pay | Admitting: Orthopaedic Surgery

## 2021-03-02 NOTE — Telephone Encounter (Signed)
Pt had MRI LSP in August reason there was denial on the LSP,I saw where she had PT done in last couple weeks. Does Tasha Wu still want the MRI LSP even though already had in Aug?

## 2021-03-03 ENCOUNTER — Other Ambulatory Visit: Payer: POS

## 2021-03-03 NOTE — Telephone Encounter (Signed)
Yes resubmit for LSP and hip MR arthrogram.  Thanks.

## 2021-03-08 NOTE — Telephone Encounter (Signed)
error 

## 2021-03-17 ENCOUNTER — Telehealth: Payer: POS | Admitting: Nurse Practitioner

## 2021-03-17 DIAGNOSIS — J029 Acute pharyngitis, unspecified: Secondary | ICD-10-CM | POA: Diagnosis not present

## 2021-03-18 NOTE — Progress Notes (Signed)
  E-Visit for Sore Throat  We are sorry that you are not feeling well.  Here is how we plan to help!  Your symptoms indicate a likely viral infection (Pharyngitis).   Pharyngitis is inflammation in the back of the throat which can cause a sore throat, scratchiness and sometimes difficulty swallowing.   Pharyngitis is typically caused by a respiratory virus and will just run its course.  Please keep in mind that your symptoms could last up to 10 days.  For throat pain, we recommend over the counter oral pain relief medications such as acetaminophen or aspirin, or anti-inflammatory medications such as ibuprofen or naproxen sodium.  Topical treatments such as oral throat lozenges or sprays may be used as needed.  Avoid close contact with loved ones, especially the very young and elderly.  Remember to wash your hands thoroughly throughout the day as this is the number one way to prevent the spread of infection and wipe down door knobs and counters with disinfectant.  After careful review of your answers, I would not recommend an antibiotic for your condition.  Antibiotics should not be used to treat conditions that we suspect are caused by viruses like the virus that causes the common cold or flu. However, some people can have Strep with atypical symptoms. You may need formal testing in clinic or office to confirm if your symptoms continue or worsen.  Providers prescribe antibiotics to treat infections caused by bacteria. Antibiotics are very powerful in treating bacterial infections when they are used properly.  To maintain their effectiveness, they should be used only when necessary.  Overuse of antibiotics has resulted in the development of super bugs that are resistant to treatment!    Home Care: Only take medications as instructed by your medical team. Do not drink alcohol while taking these medications. A steam or ultrasonic humidifier can help congestion.  You can place a towel over your head and  breathe in the steam from hot water coming from a faucet. Avoid close contacts especially the very young and the elderly. Cover your mouth when you cough or sneeze. Always remember to wash your hands.  Get Help Right Away If: You develop worsening fever or throat pain. You develop a severe head ache or visual changes. Your symptoms persist after you have completed your treatment plan.  Make sure you Understand these instructions. Will watch your condition. Will get help right away if you are not doing well or get worse.   Thank you for choosing an e-visit.  Your e-visit answers were reviewed by a board certified advanced clinical practitioner to complete your personal care plan. Depending upon the condition, your plan could have included both over the counter or prescription medications.  Please review your pharmacy choice. Make sure the pharmacy is open so you can pick up prescription now. If there is a problem, you may contact your provider through MyChart messaging and have the prescription routed to another pharmacy.  Your safety is important to us. If you have drug allergies check your prescription carefully.   For the next 24 hours you can use MyChart to ask questions about today's visit, request a non-urgent call back, or ask for a work or school excuse. You will get an email in the next two days asking about your experience. I hope that your e-visit has been valuable and will speed your recovery.  5-10 minutes spent reviewing and documenting in chart.   

## 2021-03-31 ENCOUNTER — Ambulatory Visit
Admission: RE | Admit: 2021-03-31 | Discharge: 2021-03-31 | Disposition: A | Discharge status: Home / Self Care | Payer: POS | Source: Ambulatory Visit | Attending: Orthopaedic Surgery | Admitting: Orthopaedic Surgery

## 2021-03-31 ENCOUNTER — Other Ambulatory Visit: Payer: Self-pay

## 2021-03-31 ENCOUNTER — Other Ambulatory Visit: Payer: POS

## 2021-03-31 DIAGNOSIS — M25552 Pain in left hip: Secondary | ICD-10-CM

## 2021-03-31 IMAGING — MR MR HIP*L* W/CM
5 series · 37 of 40 positions shown · IV contrast (agent unspecified)
Comparison: None.

CLINICAL DATA: Left hip pain

EXAM:
MRI OF THE LEFT HIP WITH CONTRAST
TECHNIQUE: Multiplanar, multisequence MR imaging was performed following the
administration of intravenous contrast.
CONTRAST:  Intra-articular contrast

[Series 2: T2 fat-sat · coronal · 4.0mm · 1.12mm/px · 8 of 18 slices shown]
[im 1/18]
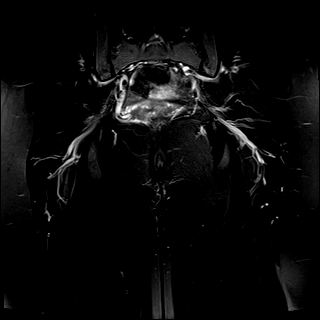
[im 3/18]
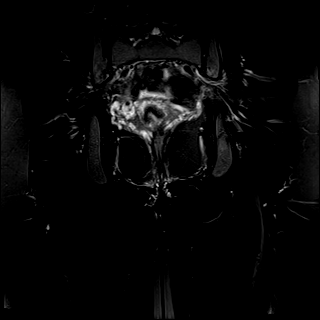
[im 5/18]
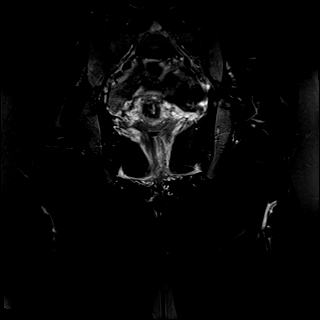
[im 8/18]
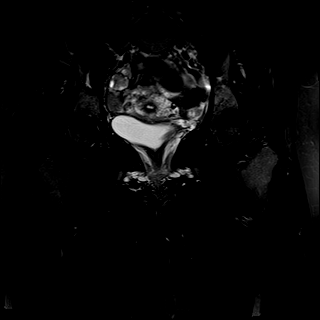
[im 10/18]
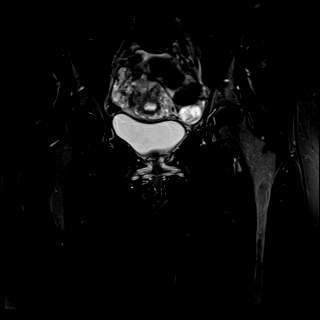
[im 13/18]
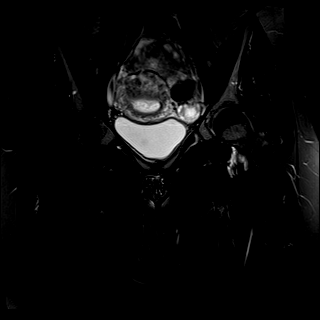
[im 15/18]
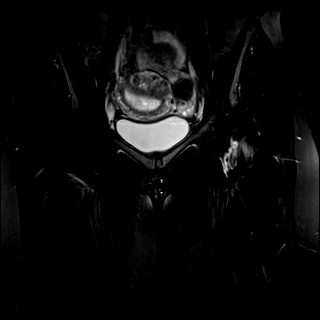
[im 18/18]
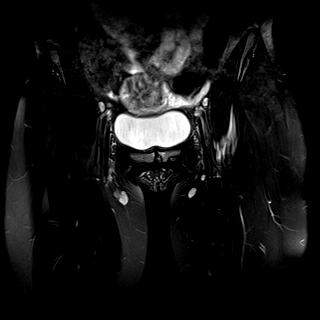

[Series 3: T1 · coronal · 4.0mm · 1.12mm/px · 7 of 18 slices shown]
[im 1/18]
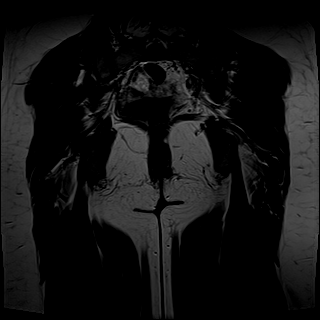
[im 3/18]
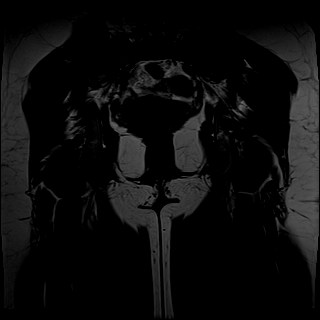
[im 6/18]
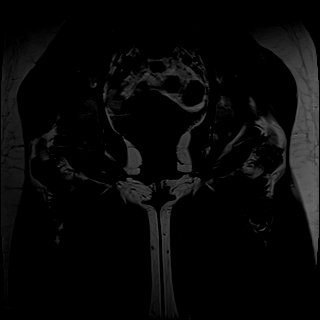
[im 9/18]
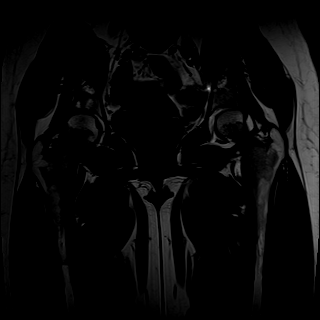
[im 12/18]
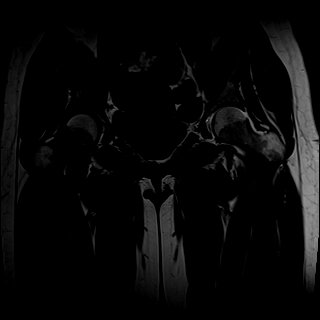
[im 15/18]
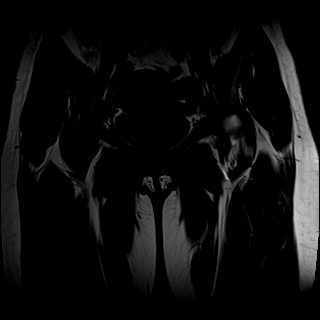
[im 18/18]
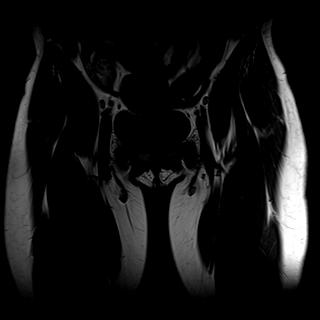

[Series 4: T1 fat-sat · coronal · 4.0mm · 0.56mm/px · 7 of 18 slices shown (1 of 3)]
[im 1/18]
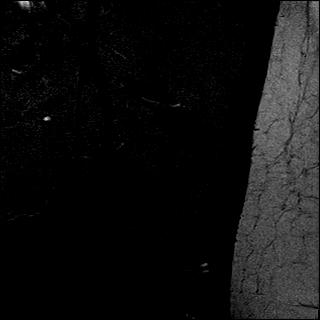
[im 3/18]
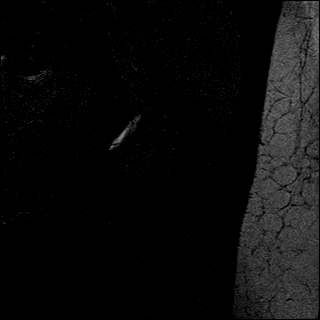
[im 6/18]
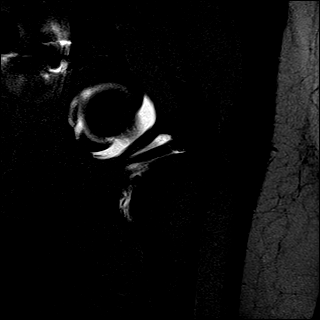
[im 9/18]
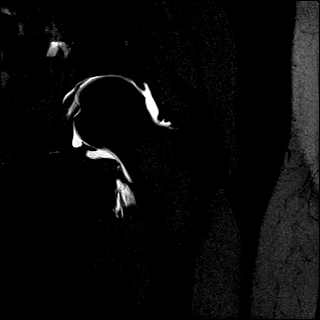
[im 12/18]
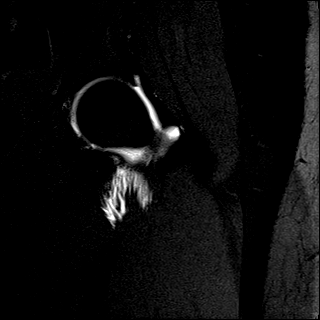
[im 15/18]
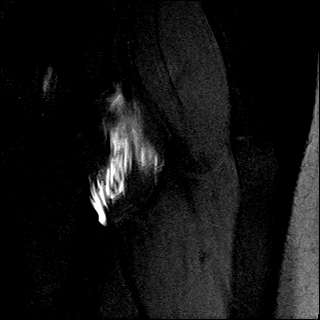
[im 18/18]
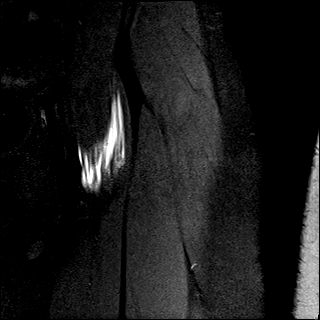

[Series 5: T1 fat-sat · axial · 4.0mm · 0.70mm/px · z∈[+76,+138]mm · 7 of 18 slices shown (2 of 3)]
[im 1/18]
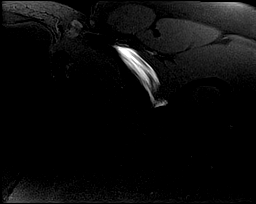
[im 3/18]
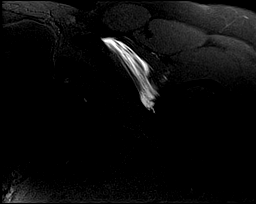
[im 6/18]
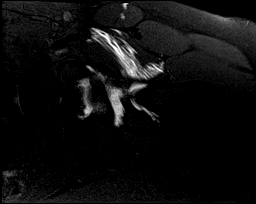
[im 9/18]
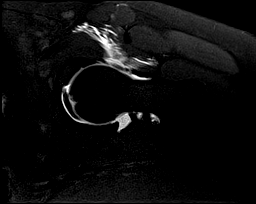
[im 12/18]
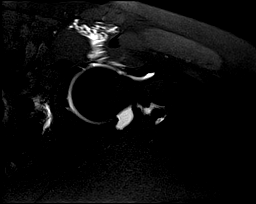
[im 15/18]
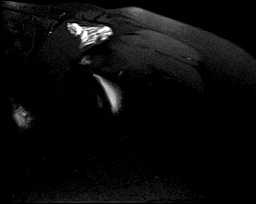
[im 18/18]
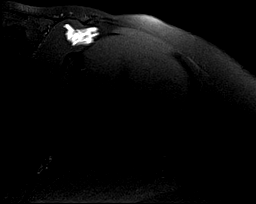

[Series 6: T1 fat-sat · sagittal · 4.0mm · 0.35mm/px · 8 of 28 slices shown (3 of 3)]
[im 1/28]
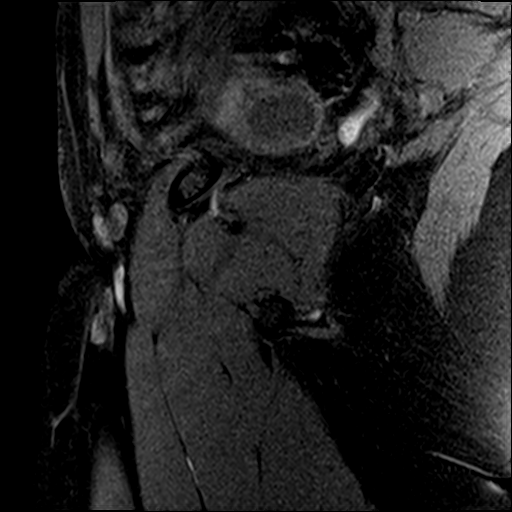
[im 6/28]
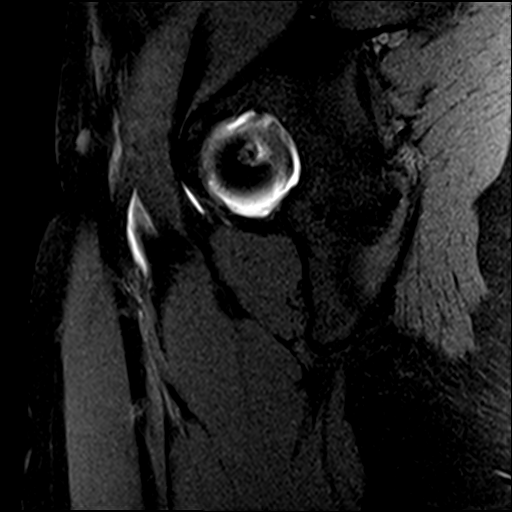
[im 9/28]
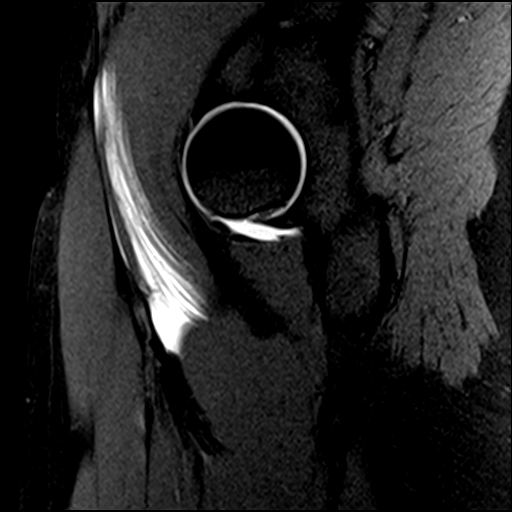
[im 11/28]
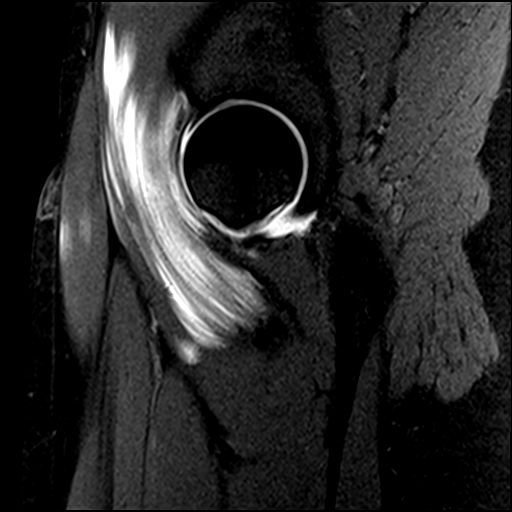
[im 17/28]
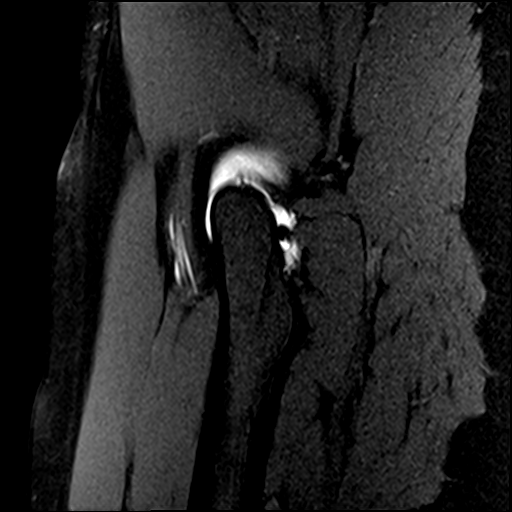
[im 19/28]
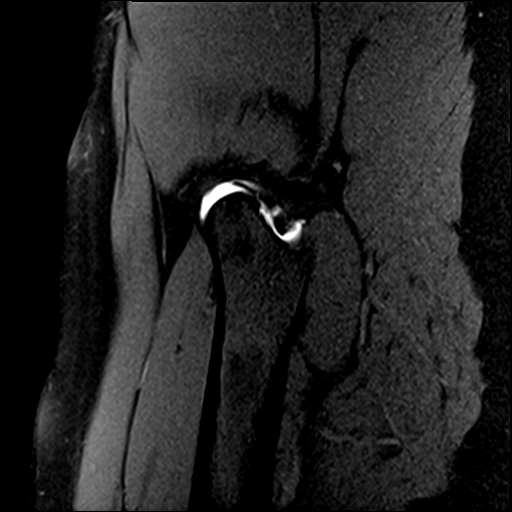
[im 22/28]
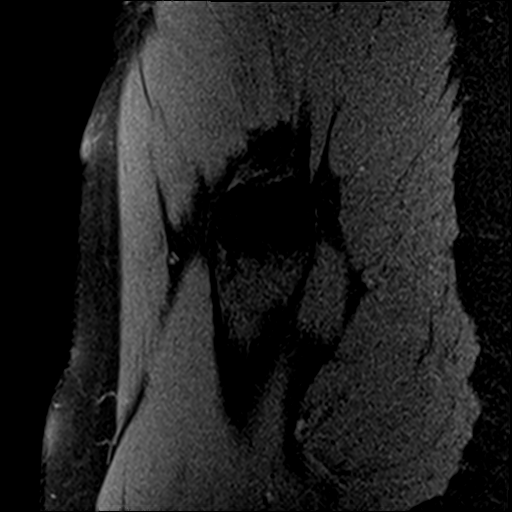
[im 28/28]
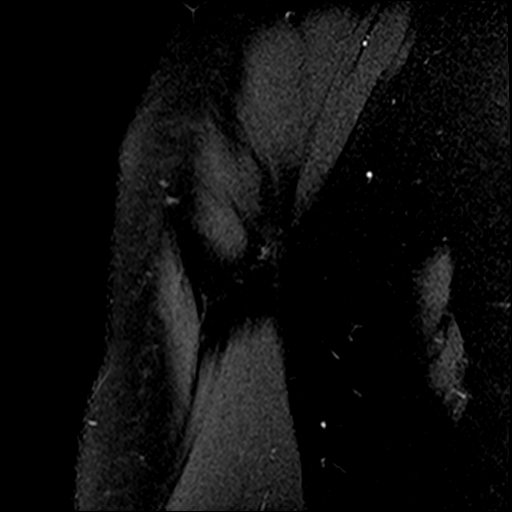

[37 of 40 positions shown; findings below may reference images not displayed]

FINDINGS: Bones: No acute fracture or dislocation. No suspicious bone marrow
signal abnormalities.

Articular cartilage and labrum

Articular cartilage: Preserved without full-thickness defects
identified.

Labrum:  Intact.

Joint or bursal effusion

Joint effusion:  Intra-articular contrast.

Bursae: Within normal limits.

Muscles and tendons

Muscles and tendons:  Within normal limits.

Other findings

Miscellaneous:   None.
IMPRESSION: No significant abnormality visualized in the left hip.

## 2021-03-31 IMAGING — XA DG FLUORO GUIDE NDL PLC/BX
1 series · 1 of 1 positions shown · non-contrast
Comparison: none

CLINICAL DATA: Left hip pain.

[Series 1: ortho standard · 1 of 1 slices shown]
[im 1/1]
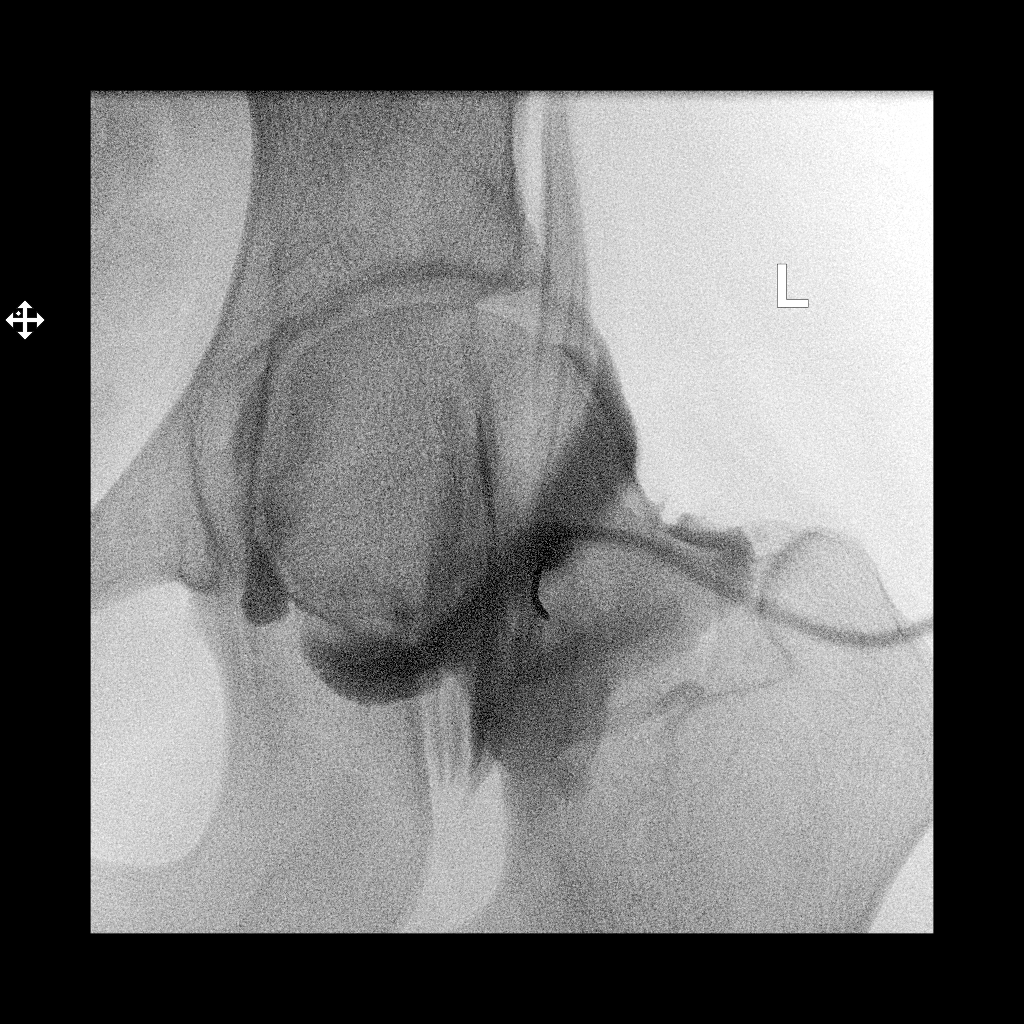

[1 of 1 positions shown; findings below may reference images not displayed]

FLUOROSCOPY TIME:  Radiation Exposure Index (as provided by the
fluoroscopic device): Dose area product 21.02 uGy*m2

PROCEDURE:
Left HIP INJECTION UNDER FLUOROSCOPY

An appropriate skin entrance site was determined. The site was
marked, prepped with Betadine, draped in the usual sterile fashion,
and infiltrated locally with 1% Lidocaine. A 22 gauge spinal needle
was advanced to the superolateral margin of the femoral head under
intermittent fluoroscopy. 1 ml of Lidocaine injected easily. A
mixture of 0.1 ml Multihance in 20 ml of dilute Isovue M 200 was
then used to opacify the left hip joint. No immediate complication.
IMPRESSION: Technically successful left hip injection for MRI.

## 2021-03-31 MED ORDER — IOPAMIDOL (ISOVUE-M 200) INJECTION 41%
20.0000 mL | Freq: Once | INTRAMUSCULAR | Status: AC
  Administered 2021-03-31: 20 mL via INTRA_ARTICULAR

## 2021-04-04 ENCOUNTER — Other Ambulatory Visit: Payer: Self-pay

## 2021-04-04 ENCOUNTER — Ambulatory Visit (INDEPENDENT_AMBULATORY_CARE_PROVIDER_SITE_OTHER): Payer: POS | Admitting: Orthopaedic Surgery

## 2021-04-04 ENCOUNTER — Encounter: Payer: Self-pay | Admitting: Orthopaedic Surgery

## 2021-04-04 DIAGNOSIS — M25552 Pain in left hip: Secondary | ICD-10-CM | POA: Diagnosis not present

## 2021-04-04 NOTE — Progress Notes (Signed)
Office Visit Note   Patient: Tasha Wu           Date of Birth: September 26, 1990           MRN: [id number] Visit Date: 04/04/2021              Requested by: Camillia Herter, NP [address],  [place] [address] PCP: Camillia Herter, NP   Assessment & Plan: Visit Diagnoses:  1. Pain in left hip     Plan: Impression is chronic posterior lateral buttock pain likely referred from her back as the hip MRI is completely normal.  We have discussed starting the new course of formal physical therapy but she notes due to her schedule and having little kids she does not think she would be able to attend and is not interested as it did not previously help.  We have discussed referral back to Dr. Kathyrn Sheriff for further evaluation.  Follow-up with [place] as needed.  Follow-Up Instructions: Return if symptoms worsen or fail to improve.   Orders:  No orders of the defined types were placed in this encounter.  No orders of the defined types were placed in this encounter.     Procedures: No procedures performed   Clinical Data: No additional findings.   Subjective: Chief Complaint  Patient presents with   Left Hip - Pain, Follow-up    HPI patient is a pleasant 31 year old female who comes in today to review MRI results of the left hip.  She has been complaining of left posterior lateral buttock pain after tripping over her bed about 9 months or so ago.  She has been complaining of radicular symptoms down the left lower extremity.  She has been on steroids, muscle relaxers and NSAIDs and been to physical therapy all without relief.  She was referred out initially for MRI of the lumbar spine which showed small disc protrusion L5-S1.  She underwent epidural steroid injection without relief.  She was then referred to Dr. Kathyrn Sheriff where she was told that she did not need surgery.  She continues to complain of the symptoms worse with sitting especially while driving.  She denies any  bowel or bladder incontinence or saddle paresthesias.     Objective: Vital Signs: There were no vitals taken for this visit.    Ortho Exam stable lumbar/hip exam  Specialty Comments:  No specialty comments available.  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   Peripheral neuropathy 09/11/2020   SVD (spontaneous vaginal delivery) 06/25/2020   Normal postpartum course 06/25/2020   Anemia affecting pregnancy in third trimester 06/25/2020   Encounter for induction of labor 06/23/2020   Anemia of pregnancy 04/14/2020   History of pulmonary embolus (PE) 01/15/2020   Pregnant 12/23/2019   Bilateral pulmonary embolism ([place]) 12/04/2019   High-risk pregnancy in first trimester    Anemia 12/21/2017   Atypical squamous cells of undetermined significance (ASC-US) on cervical Pap smear 08/19/2017   Pyelonephritis 07/22/2017   Sickle cell trait ([place]) 07/21/2017   Anxiety 10/03/2016   Gastroesophageal reflux disease 10/03/2016   Migraine with aura 10/03/2016   Polycystic ovaries 10/03/2016   Post-term pregnancy 06/18/2012   Abnormal Pap smear 0000000   Umbilical hernia 99991111   H/O pyelonephritis 11/29/2011   Past Medical History:  Diagnosis  Date   Abnormal Pap smear 12/10/2011   Anemia    Anxiety 2010   anxiety attack;no meds;has not been dx'd   Emotional instability ([place])    Break down easily when things are not going her way   GERD (gastroesophageal reflux disease)    History of pulmonary embolus during pregnancy    Hx of joint problems    Shoulders and knees have pain   Infection    Yeast;would get frequently   Infection    BV x 1   Kidney infection 2010   PO antibxs   Kidney stone complicating pregnancy    Migraines    no rx'd meds in the past;goes to sleep   PCOS  (polycystic ovarian syndrome)    Sickle cell trait ([facility])    Sickle cell trait ([place])    Umbilical hernia [id number]   Does not cause anyp problems   Vaginal Pap smear, abnormal     Family History  Problem Relation Age of Onset   Sickle cell trait Mother    Hypertension Mother    Anemia Mother    Sickle cell trait Brother    Sickle cell anemia Maternal Aunt        Recently deceased from [place] crisis   Diabetes Maternal Aunt    Sickle cell anemia Maternal Grandmother        Deceased   Heart disease Maternal Aunt    Asthma Sister        1/2 sibling;father's child   Hyperthyroidism Paternal Grandmother    Scoliosis Paternal Grandmother    Arthritis Paternal 70    Migraines Father    Other Father        Joint Problems;shoulders and knees   Hernia Maternal Aunt        Umbilical   Hypertension Maternal Uncle    Diabetes type I Cousin        maternal   Diabetes Paternal Grandfather     Past Surgical History:  Procedure Laterality Date   DILATION AND CURETTAGE OF UTERUS  [id number]   No complications   IR ANGIOGRAM SELECTIVE EACH ADDITIONAL VESSEL  12/05/2019   IR ANGIOGRAM SELECTIVE EACH ADDITIONAL VESSEL  12/05/2019   IR INFUSION THROMBOL ARTERIAL INITIAL (MS)  12/05/2019   IR THROMB F/U EVAL ART/VEN FINAL DAY (MS)  12/06/2019   IR US GUIDE VASC ACCESS LEFT  12/05/2019   IR US GUIDE VASC ACCESS LEFT  12/05/2019   WISDOM TOOTH EXTRACTION     Social History   Occupational History   Not on file  Tobacco Use   Smoking status: Former    Packs/day: 0.25    Years: 5.00    Pack years: 1.25    Types: Cigarettes    Quit date: 11/18/2011    Years since quitting: 9.3   Smokeless tobacco: Never  Vaping Use   Vaping Use: Never used  Substance and Sexual Activity   Alcohol use: Not Currently    Comment: occ   Drug use: No   Sexual activity: Yes    Partners: Male    Birth control/protection: None

## 2021-05-10 ENCOUNTER — Telehealth: Payer: POS | Admitting: Physician Assistant

## 2021-05-10 DIAGNOSIS — M549 Dorsalgia, unspecified: Secondary | ICD-10-CM

## 2021-05-10 NOTE — Progress Notes (Signed)
Based on what you shared with me, I feel your condition warrants further evaluation and I recommend that you be seen in a face to face visit. ? ?Giving severity and duration of pain along with other noted concerning symptoms -- you will need to be evaluated in person today either at local urgent care or ER. This is very important so you can get proper assessment and treatment.  ?  ?NOTE: There will be NO CHARGE for this eVisit ?  ?If you are having a true medical emergency please call 911.   ?  ? For an urgent face to face visit, [facility] has six urgent care centers for your convenience:  ?  ? [facility] ?Get Driving Directions ?[phone] ?[phone] ?[place], [place] [address] ?  ? [facility]) ?Get Driving Directions ?[phone] ?[address] ?[place], [place] [address] ? ?[facility]) ?Get Driving Directions ?[phone] ?[address] ?[place],  [place]  [address] ? ?[facility] ?Get Driving Directions ?[phone] ?[address] ?[place], [place] [address] ?  ?[facility] [place] ?Get Driving Directions  ?[phone] ?[address] ?[address] ?[place], [place] [address] ?  ?[facility] ?Get Driving Directions ?[phone] ?[address] ?[place], [place] [address] ? ?Your MyChart E-visit questionnaire answers were reviewed by a board certified advanced clinical practitioner to complete your personal care plan based on your specific symptoms.  Thank you for using e-Visits. ?  ? ?

## 2021-05-16 ENCOUNTER — Other Ambulatory Visit: Payer: Self-pay

## 2021-05-16 ENCOUNTER — Ambulatory Visit (INDEPENDENT_AMBULATORY_CARE_PROVIDER_SITE_OTHER): Payer: Medicaid Other | Admitting: Nurse Practitioner

## 2021-05-16 ENCOUNTER — Encounter: Payer: Self-pay | Admitting: Nurse Practitioner

## 2021-05-16 ENCOUNTER — Encounter: Payer: Self-pay | Admitting: Family

## 2021-05-16 VITALS — BP 113/78 | HR 82 | Resp 18 | Ht 67.0 in | Wt 151.4 lb

## 2021-05-16 DIAGNOSIS — M5116 Intervertebral disc disorders with radiculopathy, lumbar region: Secondary | ICD-10-CM | POA: Diagnosis not present

## 2021-05-16 MED ORDER — MELOXICAM 7.5 MG PO TABS: 7.5000 mg | ORAL_TABLET | Freq: Every day | ORAL | 0 refills | Status: DC

## 2021-05-16 MED ORDER — TIZANIDINE HCL 4 MG PO TABS: 4.0000 mg | ORAL_TABLET | Freq: Four times a day (QID) | ORAL | 0 refills | Status: DC | PRN

## 2021-05-16 NOTE — Assessment & Plan Note (Signed)
-   Ambulatory referral to Neurosurgery ?- meloxicam (MOBIC) 7.5 MG tablet; Take 1 tablet (7.5 mg total) by mouth daily.  Dispense: 30 tablet; Refill: 0 ?- tiZANidine (ZANAFLEX) 4 MG tablet; Take 1 tablet (4 mg total) by mouth every 6 (six) hours as needed for muscle spasms.  Dispense: 30 tablet; Refill: 0 ?

## 2021-05-16 NOTE — Progress Notes (Signed)
Chief Complaint  Patient presents with   Back Pain     Subjective:    Tasha Wu is a 31 y.o. female who presents for evaluation of low back pain. The patient has had recurrent self limited episodes of low back pain in the past and a previous herniated disc. Symptoms have been present for 8 months and are gradually worsening.  Onset was related to / precipitated by no known injury. The pain is located in the left lumbar area and radiates to the buttock, left hip, left thigh. The pain is described as throbbing and occurs all day. She rates her pain as moderate. Symptoms are exacerbated by exercise and standing. Symptoms are improved by acetaminophen, ice, NSAIDs, and rest. She has also tried  physical therapy and joint injections  which provided minimal symptom relief. She has burning pain in the left leg associated with the back pain. The patient has no "red flag" history indicative of complicated back pain. Patient is requesting a referral to neurosurgery for a second opinion after recently seen by ortho. Denies f/c/s, n/v/d, hemoptysis, PND, chest pain or edema.     Review of Systems Review of Systems  Constitutional: Negative.   Musculoskeletal:  Positive for back pain.  Skin: Negative.   Neurological: Negative.   Psychiatric/Behavioral: Negative.       Objective:    Vitals with BMI 05/16/2021 02/13/2021 02/06/2021  Height 5\' 7"  5' 7.008" 5' 7.008"  Weight 151 lbs 6 oz 151 lbs 149 lbs 13 oz  BMI 23.7 23.64 23.46  Systolic 113 109 130  Diastolic 78 71 77  Pulse 82 72 81     Physical Exam Constitutional:      General: She is not in acute distress. Cardiovascular:     Rate and Rhythm: Normal rate and regular rhythm.  Pulmonary:     Effort: Pulmonary effort is normal.     Breath sounds: Normal breath sounds.  Musculoskeletal:     Right lower leg: No edema.     Left lower leg: No edema.     Comments: Muscle tone and ROM exam: full range of motion with  pain. Neurological: decreased ROM to left hip.  Skin:    General: Skin is warm and dry.  Neurological:     Mental Status: She is alert and oriented to person, place, and time.  Psychiatric:        Mood and Affect: Affect normal.       Assessment:    Lumbar disc herniation with radiculopathy - Plan: Ambulatory referral to Neurosurgery, meloxicam (MOBIC) 7.5 MG tablet, tiZANidine (ZANAFLEX) 4 MG tablet    Plan:    Natural history and expected course discussed. Questions answered. Agricultural engineer distributed. Stretching exercises discussed. Ice to affected area as needed for local pain relief. NSAIDs per medication orders. Muscle relaxants per medication orders. Neurosurgery referral due to failure of PT and joint injections, medications providing minimal relief. Follow-up in 2 week.   Patient Instructions  1. Lumbar disc herniation with radiculopathy  - Ambulatory referral to Neurosurgery - meloxicam (MOBIC) 7.5 MG tablet; Take 1 tablet (7.5 mg total) by mouth daily.  Dispense: 30 tablet; Refill: 0 - tiZANidine (ZANAFLEX) 4 MG tablet; Take 1 tablet (4 mg total) by mouth every 6 (six) hours as needed for muscle spasms.  Dispense: 30 tablet; Refill: 0     Acute Back Pain, Adult Acute back pain is sudden and usually short-lived. It is often caused by an injury to the muscles and  tissues in the back. The injury may result from: A muscle, tendon, or ligament getting overstretched or torn. Ligaments are tissues that connect bones to each other. Lifting something improperly can cause a back strain. Wear and tear (degeneration) of the spinal disks. Spinal disks are circular tissue that provide cushioning between the bones of the spine (vertebrae). Twisting motions, such as while playing sports or doing yard work. A hit to the back. Arthritis. You may have a physical exam, lab tests, and imaging tests to find the cause of your pain. Acute back pain usually goes away with rest  and home care. Follow these instructions at home: Managing pain, stiffness, and swelling Take over-the-counter and prescription medicines only as told by your health care provider. Treatment may include medicines for pain and inflammation that are taken by mouth or applied to the skin, or muscle relaxants. Your health care provider may recommend applying ice during the first 24-48 hours after your pain starts. To do this: Put ice in a plastic bag. Place a towel between your skin and the bag. Leave the ice on for 20 minutes, 2-3 times a day. Remove the ice if your skin turns bright red. This is very important. If you cannot feel pain, heat, or cold, you have a greater risk of damage to the area. If directed, apply heat to the affected area as often as told by your health care provider. Use the heat source that your health care provider recommends, such as a moist heat pack or a heating pad. Place a towel between your skin and the heat source. Leave the heat on for 20-30 minutes. Remove the heat if your skin turns bright red. This is especially important if you are unable to feel pain, heat, or cold. You have a greater risk of getting burned. Activity  Do not stay in bed. Staying in bed for more than 1-2 days can delay your recovery. Sit up and stand up straight. Avoid leaning forward when you sit or hunching over when you stand. If you work at a desk, sit close to it so you do not need to lean over. Keep your chin tucked in. Keep your neck drawn back, and keep your elbows bent at a 90-degree angle (right angle). Sit high and close to the steering wheel when you drive. Add lower back (lumbar) support to your car seat, if needed. Take short walks on even surfaces as soon as you are able. Try to increase the length of time you walk each day. Do not sit, drive, or stand in one place for more than 30 minutes at a time. Sitting or standing for long periods of time can put stress on your back. Do not  drive or use heavy machinery while taking prescription pain medicine. Use proper lifting techniques. When you bend and lift, use positions that put less stress on your back: [place] your knees. Keep the load close to your body. Avoid twisting. Exercise regularly as told by your health care provider. Exercising helps your back heal faster and helps prevent back injuries by keeping muscles strong and flexible. Work with a physical therapist to make a safe exercise program, as recommended by your health care provider. Do any exercises as told by your physical therapist. Lifestyle Maintain a healthy weight. Extra weight puts stress on your back and makes it difficult to have good posture. Avoid activities or situations that make you feel anxious or stressed. Stress and anxiety increase muscle tension and can make back  pain worse. Learn ways to manage anxiety and stress, such as through exercise. General instructions Sleep on a firm mattress in a comfortable position. Try lying on your side with your knees slightly bent. If you lie on your back, put a pillow under your knees. Keep your head and neck in a straight line with your spine (neutral position) when using electronic equipment like smartphones or pads. To do this: Raise your smartphone or pad to look at it instead of bending your head or neck to look down. Put the smartphone or pad at the level of your face while looking at the screen. Follow your treatment plan as told by your health care provider. This may include: Cognitive or behavioral therapy. Acupuncture or massage therapy. Meditation or yoga. Contact a health care provider if: You have pain that is not relieved with rest or medicine. You have increasing pain going down into your legs or buttocks. Your pain does not improve after 2 weeks. You have pain at night. You lose weight without trying. You have a fever or chills. You develop nausea or vomiting. You develop abdominal  pain. Get help right away if: You develop new bowel or bladder control problems. You have unusual weakness or numbness in your arms or legs. You feel faint. These symptoms may represent a serious problem that is an emergency. Do not wait to see if the symptoms will go away. Get medical help right away. Call your local emergency services (911 in the [place] Do not drive yourself to the hospital. Summary Acute back pain is sudden and usually short-lived. Use proper lifting techniques. When you bend and lift, use positions that put less stress on your back. Take over-the-counter and prescription medicines only as told by your health care provider, and apply heat or ice as told. This information is not intended to replace advice given to you by your health care provider. Make sure you discuss any questions you have with your health care provider. Document Revised: 05/13/2020 Document Reviewed: 05/13/2020 Elsevier Patient Education  [address]  [place], [place] 05/16/21

## 2021-05-16 NOTE — Patient Instructions (Addendum)
1. Lumbar disc herniation with radiculopathy ? ?- Ambulatory referral to Neurosurgery ?- meloxicam (MOBIC) 7.5 MG tablet; Take 1 tablet (7.5 mg total) by mouth daily.  Dispense: 30 tablet; Refill: 0 ?- tiZANidine (ZANAFLEX) 4 MG tablet; Take 1 tablet (4 mg total) by mouth every 6 (six) hours as needed for muscle spasms.  Dispense: 30 tablet; Refill: 0 ? ? ? ? ?Acute Back Pain, Adult ?Acute back pain is sudden and usually short-lived. It is often caused by an injury to the muscles and tissues in the back. The injury may result from: ?A muscle, tendon, or ligament getting overstretched or torn. Ligaments are tissues that connect bones to each other. Lifting something improperly can cause a back strain. ?Wear and tear (degeneration) of the spinal disks. Spinal disks are circular tissue that provide cushioning between the bones of the spine (vertebrae). ?Twisting motions, such as while playing sports or doing yard work. ?A hit to the back. ?Arthritis. ?You may have a physical exam, lab tests, and imaging tests to find the cause of your pain. Acute back pain usually goes away with rest and home care. ?Follow these instructions at home: ?Managing pain, stiffness, and swelling ?Take over-the-counter and prescription medicines only as told by your health care provider. Treatment may include medicines for pain and inflammation that are taken by mouth or applied to the skin, or muscle relaxants. ?Your health care provider may recommend applying ice during the first 24-48 hours after your pain starts. To do this: ?Put ice in a plastic bag. ?Place a towel between your skin and the bag. ?Leave the ice on for 20 minutes, 2-3 times a day. ?Remove the ice if your skin turns bright red. This is very important. If you cannot feel pain, heat, or cold, you have a greater risk of damage to the area. ?If directed, apply heat to the affected area as often as told by your health care provider. Use the heat source that your health care  provider recommends, such as a moist heat pack or a heating pad. ?Place a towel between your skin and the heat source. ?Leave the heat on for 20-30 minutes. ?Remove the heat if your skin turns bright red. This is especially important if you are unable to feel pain, heat, or cold. You have a greater risk of getting burned. ?Activity ? ?Do not stay in bed. Staying in bed for more than 1-2 days can delay your recovery. ?Sit up and stand up straight. Avoid leaning forward when you sit or hunching over when you stand. ?If you work at a desk, sit close to it so you do not need to lean over. Keep your chin tucked in. Keep your neck drawn back, and keep your elbows bent at a 90-degree angle (right angle). ?Sit high and close to the steering wheel when you drive. Add lower back (lumbar) support to your car seat, if needed. ?Take short walks on even surfaces as soon as you are able. Try to increase the length of time you walk each day. ?Do not sit, drive, or stand in one place for more than 30 minutes at a time. Sitting or standing for long periods of time can put stress on your back. ?Do not drive or use heavy machinery while taking prescription pain medicine. ?Use proper lifting techniques. When you bend and lift, use positions that put less stress on your back: ?[place] your knees. ?Keep the load close to your body. ?Avoid twisting. ?Exercise regularly as told by your  health care provider. Exercising helps your back heal faster and helps prevent back injuries by keeping muscles strong and flexible. ?Work with a physical therapist to make a safe exercise program, as recommended by your health care provider. Do any exercises as told by your physical therapist. ?Lifestyle ?Maintain a healthy weight. Extra weight puts stress on your back and makes it difficult to have good posture. ?Avoid activities or situations that make you feel anxious or stressed. Stress and anxiety increase muscle tension and can make back pain worse. Learn  ways to manage anxiety and stress, such as through exercise. ?General instructions ?Sleep on a firm mattress in a comfortable position. Try lying on your side with your knees slightly bent. If you lie on your back, put a pillow under your knees. ?Keep your head and neck in a straight line with your spine (neutral position) when using electronic equipment like smartphones or pads. To do this: ?Raise your smartphone or pad to look at it instead of bending your head or neck to look down. ?Put the smartphone or pad at the level of your face while looking at the screen. ?Follow your treatment plan as told by your health care provider. This may include: ?Cognitive or behavioral therapy. ?Acupuncture or massage therapy. ?Meditation or yoga. ?Contact a health care provider if: ?You have pain that is not relieved with rest or medicine. ?You have increasing pain going down into your legs or buttocks. ?Your pain does not improve after 2 weeks. ?You have pain at night. ?You lose weight without trying. ?You have a fever or chills. ?You develop nausea or vomiting. ?You develop abdominal pain. ?Get help right away if: ?You develop new bowel or bladder control problems. ?You have unusual weakness or numbness in your arms or legs. ?You feel faint. ?These symptoms may represent a serious problem that is an emergency. Do not wait to see if the symptoms will go away. Get medical help right away. Call your local emergency services (911 in the [place] Do not drive yourself to the hospital. ?Summary ?Acute back pain is sudden and usually short-lived. ?Use proper lifting techniques. When you bend and lift, use positions that put less stress on your back. ?Take over-the-counter and prescription medicines only as told by your health care provider, and apply heat or ice as told. ?This information is not intended to replace advice given to you by your health care provider. Make sure you discuss any questions you have with your health care  provider. ?Document Revised: 05/13/2020 Document Reviewed: 05/13/2020 ?Elsevier Patient Education ? 2022 Elsevier Inc. ? ?

## 2021-05-17 NOTE — Progress Notes (Deleted)
? ? ?Patient ID: Tasha Wu, female    DOB: 1990/08/18  MRN: [id number] ? ?CC: Back Pain Follow-Up ? ?Subjective: ?Tasha Wu is a 31 y.o. female who presents for back pain follow-up.  ? ?Her concerns today include:  ?BACK PAIN FOLLOW-UP: ?05/16/2021 with Lazaro Arms, NP: ?Natural history and expected course discussed. Questions answered. ?Neurosurgeon distributed. ?Stretching exercises discussed. ?Ice to affected area as needed for local pain relief. ?NSAIDs per medication orders. MELOXICAM ?Muscle relaxants per medication orders. TIZANIDINE ?Neurosurgery referral due to failure of PT and joint injections, medications providing minimal relief. ?Follow-up in 2 week. ?  ?05/22/2021: ? ?Patient Active Problem List  ? Diagnosis Date Noted  ? Chronic headache disorder 02/06/2021  ? Benign cyst of left kidney 02/06/2021  ? 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 Peripheral neuropathy 09/11/2020  ? SVD (spontaneous vaginal delivery) 06/25/2020  ? Normal postpartum course 06/25/2020  ? Anemia affecting pregnancy in third trimester 06/25/2020  ? Encounter for induction of labor 06/23/2020  ? Anemia of pregnancy 04/14/2020  ? History of pulmonary embolus (PE) 01/15/2020  ? Pregnant 12/23/2019  ? Bilateral pulmonary embolism ([place]) 12/04/2019  ? High-risk pregnancy in first trimester   ? Anemia 12/21/2017  ? Atypical squamous cells of undetermined significance (ASC-US) on cervical Pap smear 08/19/2017  ? Pyelonephritis 07/22/2017  ? Sickle cell trait ([place]) 07/21/2017  ? Anxiety 10/03/2016  ? Gastroesophageal reflux disease 10/03/2016  ? Migraine with aura 10/03/2016  ? Polycystic ovaries 10/03/2016  ? Post-term pregnancy 06/18/2012  ? Abnormal Pap smear 12/10/2011  ? Umbilical hernia [id number]  ? H/O pyelonephritis 11/29/2011  ?  ? ?Current Outpatient Medications on File Prior to Visit  ?Medication Sig Dispense Refill  ? HYDROcodone-acetaminophen (NORCO) 5-325 MG tablet Take 1 tablet by  mouth every 4 (four) hours as needed for moderate pain. 30 tablet 0  ? iron polysaccharides (NIFEREX) 150 MG capsule Take 1 capsule (150 mg total) by mouth daily. 30 capsule 1  ? meloxicam (MOBIC) 7.5 MG tablet Take 1 tablet (7.5 mg total) by mouth daily. 30 tablet 0  ? tiZANidine (ZANAFLEX) 4 MG tablet Take 1 tablet (4 mg total) by mouth every 6 (six) hours as needed for muscle spasms. 30 tablet 0  ? ?No current facility-administered medications on file prior to visit.  ? ? ?No Known Allergies ? ?Social History  ? ?Socioeconomic History  ? Marital status: Significant Other  ?  Spouse name: Not on file  ? Number of children: 2  ? Years of education: 39  ? Highest education level: Not on file  ?Occupational History  ? Not on file  ?Tobacco Use  ? Smoking status: Former  ?  Packs/day: 0.25  ?  Years: 5.00  ?  Pack years: 1.25  ?  Types: Cigarettes  ?  Quit date: 11/18/2011  ?  Years since quitting: 9.5  ? Smokeless tobacco: Never  ?Vaping Use  ? Vaping Use: Never used  ?Substance and Sexual Activity  ? Alcohol use: Not Currently  ?  Comment: occ  ? Drug use: No  ? Sexual activity: Yes  ?  Partners: Male  ?  Birth control/protection: None  ?Other Topics Concern  ? Not on file  ?Social History Narrative  ? Not on file  ? ?Social Determinants of Health  ? ?Financial Resource Strain: Not on file  ?Food Insecurity: Not on file  ?Transportation Needs: Not on file  ?Physical Activity: Not on file  ?Stress: Not on file  ?  Social Connections: Not on file  ?Intimate Partner Violence: Not on file  ? ? ?Family History  ?Problem Relation Age of Onset  ? Sickle cell trait Mother   ? Hypertension Mother   ? Anemia Mother   ? Sickle cell trait Brother   ? Sickle cell anemia Maternal Aunt   ?     Recently deceased from [facility] crisis  ? Diabetes Maternal Aunt   ? Sickle cell anemia Maternal Grandmother   ?     Deceased  ? Heart disease Maternal Aunt   ? Asthma Sister   ?     1/2 sibling;father's child  ? Hyperthyroidism Paternal Grandmother    ? Scoliosis Paternal Grandmother   ? Arthritis Paternal Grandmother   ? Migraines Father   ? Other Father   ?     Joint Problems;shoulders and knees  ? Hernia Maternal Aunt   ?     Umbilical  ? Hypertension Maternal Uncle   ? Diabetes type I Cousin   ?     maternal  ? Diabetes Paternal Grandfather   ? ? ?Past Surgical History:  ?Procedure Laterality Date  ? DILATION AND CURETTAGE OF UTERUS  2009  ? No complications  ? IR ANGIOGRAM SELECTIVE EACH ADDITIONAL VESSEL  12/05/2019  ? IR ANGIOGRAM SELECTIVE EACH ADDITIONAL VESSEL  12/05/2019  ? IR INFUSION THROMBOL ARTERIAL INITIAL (MS)  12/05/2019  ? IR THROMB F/U EVAL ART/VEN FINAL DAY (MS)  12/06/2019  ? IR US GUIDE VASC ACCESS LEFT  12/05/2019  ? IR US GUIDE VASC ACCESS LEFT  12/05/2019  ? WISDOM TOOTH EXTRACTION    ? ? ?ROS: ?Review of Systems ?Negative except as stated above ? ?PHYSICAL EXAM: ?LMP 05/01/2021  ? ?Physical Exam ? ?{female adult master:310786} ?{female adult master:310785} ? ?CMP Latest Ref Rng & Units 02/06/2021 12/15/2019 12/09/2019  ?Glucose 70 - 99 mg/dL 83 66 81  ?BUN 6 - 20 mg/dL 13 11 9   ?Creatinine 0.57 - 1.00 mg/dL 0.95 0.66 0.67  ?Sodium 134 - 144 mmol/L 142 135 134(L)  ?Potassium 3.5 - 5.2 mmol/L 4.2 4.6 3.8  ?Chloride 96 - 106 mmol/L 105 101 104  ?CO2 20 - 29 mmol/L 23 18(L) 22  ?Calcium 8.7 - 10.2 mg/dL 9.4 9.6 8.7(L)  ?Total Protein 6.0 - 8.5 g/dL 7.3 7.2 -  ?Total Bilirubin 0.0 - 1.2 mg/dL 0.5 0.3 -  ?Alkaline Phos 44 - 121 IU/L 70 53 -  ?AST 0 - 40 IU/L 15 16 -  ?ALT 0 - 32 IU/L 6 9 -  ? ?Lipid Panel  ?   ?Component Value Date/Time  ? CHOL 152 02/06/2021 0928  ? TRIG 63 02/06/2021 0928  ? HDL 52 02/06/2021 0928  ? CHOLHDL 2.9 02/06/2021 0928  ? [place] 87 02/06/2021 0928  ? ? ?CBC ?   ?Component Value Date/Time  ? WBC 4.0 02/06/2021 0928  ? WBC 7.7 06/24/2020 0539  ? RBC 5.04 02/06/2021 0928  ? RBC 3.40 (L) 06/24/2020 0539  ? HGB 11.5 02/06/2021 0928  ? HCT 37.3 02/06/2021 0928  ? PLT 273 02/06/2021 0928  ? MCV 74 (L) 02/06/2021 [id number]  ? MCH 22.8  (L) 02/06/2021 [id number]  ? MCH 22.6 (L) 06/24/2020 0539  ? MCHC 30.8 (L) 02/06/2021 [id number]  ? MCHC 31.6 06/24/2020 0539  ? RDW 14.8 02/06/2021 0928  ? LYMPHSABS 1.3 12/15/2019 1630  ? MONOABS 0.8 10/09/2013 0025  ? EOSABS 0.1 12/15/2019 1630  ? BASOSABS 0.1 12/15/2019 1630  ? ? ?ASSESSMENT AND  PLAN: ? ?There are no diagnoses linked to this encounter. ? ? ?Patient was given the opportunity to ask questions.  Patient verbalized understanding of the plan and was able to repeat key elements of the plan. Patient was given clear instructions to go to Emergency Department or return to medical center if symptoms don't improve, worsen, or new problems develop.The patient verbalized understanding. ? ? ?No orders of the defined types were placed in this encounter. ? ? ? ?Requested Prescriptions  ? ? No prescriptions requested or ordered in this encounter  ? ? ?No follow-ups on file. ? ?Camillia Herter, NP  ?

## 2021-05-22 ENCOUNTER — Ambulatory Visit: Payer: Self-pay | Admitting: *Deleted

## 2021-05-22 ENCOUNTER — Ambulatory Visit: Payer: Medicaid Other | Admitting: Family

## 2021-05-22 DIAGNOSIS — M5116 Intervertebral disc disorders with radiculopathy, lumbar region: Secondary | ICD-10-CM

## 2021-05-22 NOTE — Telephone Encounter (Signed)
?  Chief Complaint: cancelled appt due to severe back pain and could not come in  ?Symptoms: low back pain low abd pain, numbness shooting down left leg ?Frequency: on going x 1 year  ?Pertinent Negatives: Patient denies issues with bowel and bladder  ?Disposition: [x] ED /[] Urgent Care (no appt availability in office) / [] Appointment(In office/virtual)/ []  [facility]/ [] Home Care/ [] Refused Recommended Disposition /[] [facility]/ []  Follow-up with PCP ?Additional Notes:  ? ?Recommended ED due to severe pain  ? ? Reason for Disposition ? [1] SEVERE back pain (e.g., excruciating, unable to do any normal activities) AND [2] not improved 2 hours after pain medicine ? ?Answer Assessment - Initial Assessment Questions ?1. ONSET: "When did the pain begin?"  ?    Greater than year July ?2. LOCATION: "Where does it hurt?" (upper, mid or lower back) ?    Lower back  ?3. SEVERITY: "How bad is the pain?"  (e.g., Scale 1-10; mild, moderate, or severe) ?  - MILD (1-3): doesn't interfere with normal activities  ?  - MODERATE (4-7): interferes with normal activities or awakens from sleep  ?  - SEVERE (8-10): excruciating pain, unable to do any normal activities  ?    Severe unable to do daily activities  ?4. PATTERN: "Is the pain constant?" (e.g., yes, no; constant, intermittent)  ?    na ?5. RADIATION: "Does the pain shoot into your legs or elsewhere?" ?    Left leg  ?6. CAUSE:  "What do you think is causing the back pain?"  ?    Herniated disk ?7. BACK OVERUSE:  "Any recent lifting of heavy objects, strenuous work or exercise?" ?    na ?8. MEDICATIONS: "What have you taken so far for the pain?" (e.g., nothing, acetaminophen, NSAIDS) ?    Not working for pain melxoicam  ?9. NEUROLOGIC SYMPTOMS: "Do you have any weakness, numbness, or problems with bowel/bladder control?" ?    Feeling pain in lower abdomen  ?10. OTHER SYMPTOMS: "Do you have any other symptoms?" (e.g., fever, abdominal pain, burning with  urination, blood in urine) ?      Low abd. Pain  ?11. PREGNANCY: "Is there any chance you are pregnant?" (e.g., yes, no; LMP) ?      na ? ?Protocols used: Back Pain-A-AH ? ?

## 2021-05-23 NOTE — Progress Notes (Signed)
? ? ?Patient ID: Tasha Wu, female    DOB: 12/01/1990  MRN: [id number] ? ?CC: Back Pain Follow-Up ? ?Subjective: ?Tasha Wu is a 31 y.o. female who presents for back pain follow-up. ? ?Her concerns today include:  ?BACK PAIN FOLLOW-UP: ?05/16/2021 with Angus Selleronya Nichols, NP: ?Natural history and expected course discussed. Questions answered. ?Agricultural engineerducational material distributed. ?Stretching exercises discussed. ?Ice to affected area as needed for local pain relief. ?NSAIDs per medication orders. ?Muscle relaxants per medication orders. ?Neurosurgery referral due to failure of PT and joint injections, medications providing minimal relief. ?Follow-up in 2 week. ? ?05/24/2021: ?Back pain persisting. Tizanidine helping. Does not prefer taking Meloxicam as caused side effect in the past. Waiting appointment with Rockne MenghiniMichaux Kilpatrick, MD at Neurosurgery. Reports unpleasant experience at current Orthopedics. States was given steroid injections of left hip and repeat MRI which was not covered by her health insurance. Requesting second opinion Orhtopedics referral. Unsure of when she will be able to return to work and requesting work letter to give to employer stating this. States there should be additional paperwork being sent to primary care for completion but hasn't been sent as of yet. Also, challenges with ambulation and requesting a temporary handicap placard. ? ?Patient Active Problem List  ? Diagnosis Date Noted  ? Chronic headache disorder 02/06/2021  ? Benign cyst of left kidney 02/06/2021  ? 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 Peripheral neuropathy 09/11/2020  ? SVD (spontaneous vaginal delivery) 06/25/2020  ? Normal postpartum course 06/25/2020  ? Anemia affecting pregnancy in third trimester 06/25/2020  ? Encounter for induction of labor 06/23/2020  ? Anemia of pregnancy 04/14/2020  ? History of pulmonary embolus (PE) 01/15/2020  ? Pregnant 12/23/2019  ? Bilateral pulmonary embolism (HCC)  12/04/2019  ? High-risk pregnancy in first trimester   ? Anemia 12/21/2017  ? Atypical squamous cells of undetermined significance (ASC-US) on cervical Pap smear 08/19/2017  ? Pyelonephritis 07/22/2017  ? Sickle cell trait (HCC) 07/21/2017  ? Anxiety 10/03/2016  ? Gastroesophageal reflux disease 10/03/2016  ? Migraine with aura 10/03/2016  ? Polycystic ovaries 10/03/2016  ? Post-term pregnancy 06/18/2012  ? Abnormal Pap smear 12/10/2011  ? Umbilical hernia 11/29/2011  ? H/O pyelonephritis 11/29/2011  ?  ? ?Current Outpatient Medications on File Prior to Visit  ?Medication Sig Dispense Refill  ? meloxicam (MOBIC) 7.5 MG tablet Take 1 tablet (7.5 mg total) by mouth daily. 30 tablet 0  ? tiZANidine (ZANAFLEX) 4 MG tablet Take 1 tablet (4 mg total) by mouth every 6 (six) hours as needed for muscle spasms. 30 tablet 0  ? ?No current facility-administered medications on file prior to visit.  ? ? ?No Known Allergies ? ?Social History  ? ?Socioeconomic History  ? Marital status: Significant Other  ?  Spouse name: Not on file  ? Number of children: 2  ? Years of education: 7315  ? Highest education level: Not on file  ?Occupational History  ? Not on file  ?Tobacco Use  ? Smoking status: Former  ?  Packs/day: 0.25  ?  Years: 5.00  ?  Pack years: 1.25  ?  Types: Cigarettes  ?  Quit date: 11/18/2011  ?  Years since quitting: 9.5  ?  Passive exposure: Never  ? Smokeless tobacco: Never  ?Vaping Use  ? Vaping Use: Never used  ?Substance and Sexual Activity  ? Alcohol use: Not Currently  ?  Comment: occ  ? Drug use: No  ? Sexual activity: Yes  ?  Partners: Male  ?  Birth control/protection: None  ?Other Topics Concern  ? Not on file  ?Social History Narrative  ? Not on file  ? ?Social Determinants of Health  ? ?Financial Resource Strain: Not on file  ?Food Insecurity: Not on file  ?Transportation Needs: Not on file  ?Physical Activity: Not on file  ?Stress: Not on file  ?Social Connections: Not on file  ?Intimate Partner Violence: Not  on file  ? ? ?Family History  ?Problem Relation Age of Onset  ? Sickle cell trait Mother   ? Hypertension Mother   ? Anemia Mother   ? Sickle cell trait Brother   ? Sickle cell anemia Maternal Aunt   ?     Recently deceased from [facility] crisis  ? Diabetes Maternal Aunt   ? Sickle cell anemia Maternal Grandmother   ?     Deceased  ? Heart disease Maternal Aunt   ? Asthma Sister   ?     1/2 sibling;father's child  ? Hyperthyroidism Paternal Grandmother   ? Scoliosis Paternal Grandmother   ? Arthritis Paternal Grandmother   ? Migraines Father   ? Other Father   ?     Joint Problems;shoulders and knees  ? Hernia Maternal Aunt   ?     Umbilical  ? Hypertension Maternal Uncle   ? Diabetes type I Cousin   ?     maternal  ? Diabetes Paternal Grandfather   ? ? ?Past Surgical History:  ?Procedure Laterality Date  ? DILATION AND CURETTAGE OF UTERUS  2009  ? No complications  ? IR ANGIOGRAM SELECTIVE EACH ADDITIONAL VESSEL  12/05/2019  ? IR ANGIOGRAM SELECTIVE EACH ADDITIONAL VESSEL  12/05/2019  ? IR INFUSION THROMBOL ARTERIAL INITIAL (MS)  12/05/2019  ? IR THROMB F/U EVAL ART/VEN FINAL DAY (MS)  12/06/2019  ? IR US GUIDE VASC ACCESS LEFT  12/05/2019  ? IR US GUIDE VASC ACCESS LEFT  12/05/2019  ? WISDOM TOOTH EXTRACTION    ? ? ?ROS: ?Review of Systems ?Negative except as stated above ? ?PHYSICAL EXAM: ?BP 116/75 (BP Location: Left Arm, Patient Position: Sitting, Cuff Size: Normal)   Pulse 78   Temp 98.3 ?F (36.8 ?C)   Resp 18   Ht 5' 7.01" (1.702 m)   Wt 147 lb (66.7 kg)   LMP 05/01/2021   SpO2 98%   Breastfeeding No   BMI 23.02 kg/m?  ? ?Physical Exam ?HENT:  ?   Head: Normocephalic and atraumatic.  ?Eyes:  ?   Extraocular Movements: Extraocular movements intact.  ?   Conjunctiva/sclera: Conjunctivae normal.  ?   Pupils: Pupils are equal, round, and reactive to light.  ?Cardiovascular:  ?   Rate and Rhythm: Normal rate and regular rhythm.  ?   Pulses: Normal pulses.  ?   Heart sounds: Normal heart sounds.  ?Pulmonary:  ?    Effort: Pulmonary effort is normal.  ?   Breath sounds: Normal breath sounds.  ?Musculoskeletal:  ?   Cervical back: Normal range of motion and neck supple.  ?Neurological:  ?   General: No focal deficit present.  ?   Mental Status: She is alert and oriented to person, place, and time.  ?Psychiatric:     ?   Mood and Affect: Mood normal.     ?   Behavior: Behavior normal.  ? ?ASSESSMENT AND PLAN: ?1. Lumbar disc herniation with radiculopathy: ?2. Pain in left hip: ?- Continue Tizanidine as prescribed.  ?- Keep  all scheduled appointments with Rockne Menghini, MD at Neurosurgery. ?- Work letter provided to patient as requested.  ?- Temporary handicap placard completed today in office. ?- Second opinion referral to Orthopedic Surgery for further evaluation and management.  ?- Follow-up with primary provider as scheduled. ?- Ambulatory referral to Orthopedic Surgery ? ?Patient was given the opportunity to ask questions.  Patient verbalized understanding of the plan and was able to repeat key elements of the plan. Patient was given clear instructions to go to Emergency Department or return to medical center if symptoms don't improve, worsen, or new problems develop.The patient verbalized understanding. ? ? ?Orders Placed This Encounter  ?Procedures  ? Ambulatory referral to Orthopedic Surgery  ? ? ? ?Requested Prescriptions  ? ? No prescriptions requested or ordered in this encounter  ? ? ?Follow-up with primary provider as scheduled.  ? ?Rema Fendt, NP  ?

## 2021-05-24 ENCOUNTER — Other Ambulatory Visit: Payer: Self-pay

## 2021-05-24 ENCOUNTER — Ambulatory Visit: Payer: Medicaid Other | Admitting: Family

## 2021-05-24 ENCOUNTER — Encounter: Payer: Self-pay | Admitting: Family

## 2021-05-24 VITALS — BP 116/75 | HR 78 | Temp 98.3°F | Resp 18 | Ht 67.01 in | Wt 147.0 lb

## 2021-05-24 DIAGNOSIS — M25552 Pain in left hip: Secondary | ICD-10-CM

## 2021-05-24 DIAGNOSIS — M5116 Intervertebral disc disorders with radiculopathy, lumbar region: Secondary | ICD-10-CM

## 2021-05-24 NOTE — Progress Notes (Signed)
Pt presents for sciatic pain follow-up  ?

## 2021-07-21 ENCOUNTER — Telehealth: Payer: Self-pay | Admitting: Family

## 2021-07-21 NOTE — Telephone Encounter (Signed)
Rutherford Nail,   Please see Donna's note and offer patient appointment accordingly.   Thank you

## 2021-07-21 NOTE — Telephone Encounter (Signed)
Provided pt with info to get Ortho notes to [place]. Once those are received, please let me know and I will arrange an appt w/pt.

## 2021-07-21 NOTE — Telephone Encounter (Signed)
The Ortho note does not appear in patient's chart or in CareEverywhere. If [facility] is an external provider from [facility] this may be the reason I am unable to see the note.   In regards to paperwork completion patient will need an appointment. She is welcome to in-person or virtual. I will need the Ortho note to reference when completing paperwork. Patient may need to sign medical release so that notes can be sent to our office for review.

## 2021-07-21 NOTE — Telephone Encounter (Signed)
Pt had sent a MyChart message in regards to her appt with Ortho back in April, where she was referred to.  She wanted to make sure that you had seen the notes from [facility]. An MRI has been ordered for her but she is awaiting authz from her insurance company. She is also going to be starting PT on May 24th. She said that she has received a letter from her employer that is requiring a letter be done in regards to her status. She says that the letter has specific items needed. I asked the patient if the letter needed to come from the Specialist and she said that Amy had told her in the past that the info would come from PCP, not Specialtist (?). It has to be submitted by June 1.  Does pt need an appt to address this letter, or just have her drop it by the office? She really wanted to make sure that you had seen Ortho's notes. Please advise.

## 2021-08-01 NOTE — Progress Notes (Signed)
Virtual Visit via Telephone Note  I connected with Tasha [name], on 08/07/2021 at 9:03 AM by telephone and verified that I am speaking with the correct person using two identifiers.  Consent: I discussed the limitations, risks, security and privacy concerns of performing an evaluation and management service by telephone and the availability of in person appointments. I also discussed with the patient that there may be a patient responsible charge related to this service. The patient expressed understanding and agreed to proceed.   Location of Patient: Home  Location of Provider: [facility]   Persons participating in Telemedicine visit: [facility] Ricky Stabs, NP Margorie John, CMA   History of Present Illness: Tasha [name] is a 31 year-old female who presents for FMLA supporting letter to submit to employer. She is employed at [employer]. 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 Reports she is unable to complete all job functions such as but not limited to extensive walking and standing, lifting any objects including overhead lifting, sweeping, mopping, vacuuming, and bending. She has been out of work since July 2022 related to chronic low back pain. She is established with Venita Lick, MD at [facility]. Reports she would like to return to work December 2023 with reasonable accommodations. She is awaiting reply from Disability Services to see if she is approved. Also, Concern of kidney cyst on recent MRI. No further issues or concerns.    Past Medical History:  Diagnosis Date   Abnormal Pap smear 12/10/2011   Anemia    Anxiety 2010   anxiety attack;no meds;has not been dx'd   Emotional instability (HCC)    Break down easily when things are not going her way   GERD (gastroesophageal reflux disease)    History of pulmonary embolus during pregnancy    Hx of joint problems    Shoulders and knees have pain    Infection    Yeast;would get frequently   Infection    BV x 1   Kidney infection 2010   PO antibxs   Kidney stone complicating pregnancy    Migraines    no rx'd meds in the past;goes to sleep   PCOS (polycystic ovarian syndrome)    Sickle cell trait (HCC)    Sickle cell trait (HCC)    Umbilical hernia 1991   Does not cause anyp problems   Vaginal Pap smear, abnormal    No Known Allergies  Current Outpatient Medications on File Prior to Visit  Medication Sig Dispense Refill   cephALEXin (KEFLEX) 500 MG capsule Take 1 capsule (500 mg total) by mouth 2 (two) times daily for 7 days. 14 capsule 0   meloxicam (MOBIC) 7.5 MG tablet Take 1 tablet (7.5 mg total) by mouth daily. 30 tablet 0   tiZANidine (ZANAFLEX) 4 MG tablet Take 1 tablet (4 mg total) by mouth every 6 (six) hours as needed for muscle spasms. 30 tablet 0   No current facility-administered medications on file prior to visit.    Observations/Objective: [name] and oriented x 3. Not in acute distress. Physical examination not completed as this is a telemedicine visit.  Assessment and Plan: 1. Encounter for completion of form with patient: 2. Vertebrogenic low back pain: - FMLA supporting letter completed today in office.  -Keep all scheduled appointments with Venita Lick, MD at [facility].  3. Kidney cysts: - Referral to Nephrology for further evaluation and management.  - Ambulatory referral to Nephrology   Follow Up Instructions:  Follow-up with primary provider as scheduled.   Patient was given clear instructions to go to Emergency Department or return to medical center if symptoms don't improve, worsen, or new problems develop.The patient verbalized understanding.  I discussed the assessment and treatment plan with the patient. The patient was provided an opportunity to ask questions and all were answered. The patient agreed with the plan and demonstrated an understanding of the instructions.   The patient was  advised to call back or seek an in-person evaluation if the symptoms worsen or if the condition fails to improve as anticipated.    I provided 11 minutes total of non-face-to-face time during this encounter.   Rema Fendt, NP  [facility] [place], [place] [phone] 08/07/2021, 9:03 AM

## 2021-08-02 ENCOUNTER — Telehealth: Payer: Medicaid Other | Admitting: Physician Assistant

## 2021-08-02 DIAGNOSIS — R3989 Other symptoms and signs involving the genitourinary system: Secondary | ICD-10-CM

## 2021-08-02 MED ORDER — CEPHALEXIN 500 MG PO CAPS: 500.0000 mg | ORAL_CAPSULE | Freq: Two times a day (BID) | ORAL | 0 refills | Status: AC

## 2021-08-02 NOTE — Progress Notes (Signed)
I have spent 5 minutes in review of e-visit questionnaire, review and updating patient chart, medical decision making and response to patient.   Emitt Maglione Cody Jayde Mcallister, PA-C    

## 2021-08-02 NOTE — Progress Notes (Signed)

## 2021-08-07 ENCOUNTER — Encounter: Payer: Self-pay | Admitting: Family

## 2021-08-07 ENCOUNTER — Ambulatory Visit: Payer: Medicaid Other | Admitting: Family

## 2021-08-07 DIAGNOSIS — M25552 Pain in left hip: Secondary | ICD-10-CM

## 2021-08-07 DIAGNOSIS — M5451 Vertebrogenic low back pain: Secondary | ICD-10-CM | POA: Diagnosis not present

## 2021-08-07 DIAGNOSIS — N281 Cyst of kidney, acquired: Secondary | ICD-10-CM | POA: Diagnosis not present

## 2021-08-07 DIAGNOSIS — M5116 Intervertebral disc disorders with radiculopathy, lumbar region: Secondary | ICD-10-CM

## 2021-08-07 DIAGNOSIS — Z0289 Encounter for other administrative examinations: Secondary | ICD-10-CM | POA: Diagnosis not present

## 2021-08-18 ENCOUNTER — Encounter: Payer: Self-pay | Admitting: Family

## 2021-08-22 ENCOUNTER — Other Ambulatory Visit: Payer: Self-pay | Admitting: Family

## 2021-08-22 DIAGNOSIS — N281 Cyst of kidney, acquired: Secondary | ICD-10-CM

## 2021-09-27 ENCOUNTER — Encounter: Payer: Self-pay | Admitting: Family

## 2021-10-18 ENCOUNTER — Ambulatory Visit (INDEPENDENT_AMBULATORY_CARE_PROVIDER_SITE_OTHER): Payer: Medicaid Other

## 2021-10-18 DIAGNOSIS — Z111 Encounter for screening for respiratory tuberculosis: Secondary | ICD-10-CM | POA: Diagnosis not present

## 2021-10-18 NOTE — Progress Notes (Signed)
PPD placement in right arm

## 2021-10-20 LAB — TB SKIN TEST
Induration: 0 mm
TB Skin Test: NEGATIVE

## 2021-11-06 ENCOUNTER — Ambulatory Visit: Payer: Medicaid Other

## 2021-11-21 ENCOUNTER — Ambulatory Visit: Payer: Medicaid Other | Admitting: Family Medicine

## 2021-11-22 ENCOUNTER — Ambulatory Visit: Payer: Medicaid Other | Admitting: Physician Assistant

## 2022-01-18 ENCOUNTER — Telehealth: Payer: Medicaid Other | Admitting: Physician Assistant

## 2022-01-18 DIAGNOSIS — B9689 Other specified bacterial agents as the cause of diseases classified elsewhere: Secondary | ICD-10-CM | POA: Diagnosis not present

## 2022-01-18 DIAGNOSIS — J019 Acute sinusitis, unspecified: Secondary | ICD-10-CM

## 2022-01-18 MED ORDER — FLUTICASONE PROPIONATE 50 MCG/ACT NA SUSP
2.0000 | Freq: Every day | NASAL | 0 refills | Status: DC
Start: 2022-01-18 — End: 2022-01-30

## 2022-01-18 MED ORDER — AMOXICILLIN-POT CLAVULANATE 875-125 MG PO TABS: 1.0000 | ORAL_TABLET | Freq: Two times a day (BID) | ORAL | 0 refills | Status: DC

## 2022-01-18 NOTE — Progress Notes (Signed)
Virtual Visit Consent   Tasha Wu, you are scheduled for a virtual visit with a [facility] provider today. Just as with appointments in the office, your consent must be obtained to participate. Your consent will be active for this visit and any virtual visit you may have with one of our providers in the next 365 days. If you have a MyChart account, a copy of this consent can be sent to you electronically.  As this is a virtual visit, video technology does not allow for your provider to perform a traditional examination. This may limit your provider's ability to fully assess your condition. If your provider identifies any concerns that need to be evaluated in person or the need to arrange testing (such as labs, EKG, etc.), we will make arrangements to do so. Although advances in technology are sophisticated, we cannot ensure that it will always work on either your end or our end. If the connection with a video visit is poor, the visit may have to be switched to a telephone visit. With either a video or telephone visit, we are not always able to ensure that we have a secure connection.  By engaging in this virtual visit, you consent to the provision of healthcare and authorize for your insurance to be billed (if applicable) for the services provided during this visit. Depending on your insurance coverage, you may receive a charge related to this service.  I need to obtain your verbal consent now. Are you willing to proceed with your visit today? Tasha Wu has provided verbal consent on 01/18/2022 for a virtual visit (video or telephone). Leeanne Rio, [place]  Date: 01/18/2022 7:51 AM  Virtual Visit via Video Note   I, Leeanne Rio, connected with  Tasha Wu  ([id number], 1991-01-19) on 01/18/22 at  7:45 AM EST by a video-enabled telemedicine application and verified that I am speaking with the correct person using two identifiers.  Location: Patient: Virtual Visit  Location Patient: Home Provider: Virtual Visit Location Provider: Home Office   I discussed the limitations of evaluation and management by telemedicine and the availability of in person appointments. The patient expressed understanding and agreed to proceed.    History of Present Illness: Tasha Wu is a 31 y.o. who identifies as a female who was assigned female at birth, and is being seen today for several days of worsening nasal and head congestion with sinus headache and left frontal and maxillary sinus pain, now with left ear pressure and pain. Unsure of fever. Some chest congestion with dry cough. Denies chest pain or SOB. Denies recent travel. 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 Has not tested for COVID.  HPI: HPI  Problems:  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   Peripheral neuropathy 09/11/2020   SVD (spontaneous vaginal delivery) 06/25/2020   Normal postpartum course 06/25/2020   Anemia affecting pregnancy in third trimester 06/25/2020   Encounter for induction of labor 06/23/2020   Anemia of pregnancy 04/14/2020   History of pulmonary embolus (PE) 01/15/2020   Pregnant 12/23/2019   Bilateral pulmonary embolism ([place]) 12/04/2019   High-risk pregnancy in first trimester    Anemia 12/21/2017   Atypical squamous cells of undetermined significance (ASC-US) on cervical Pap smear 08/19/2017   Pyelonephritis 07/22/2017   Sickle cell trait ([place]) 07/21/2017  Anxiety 10/03/2016   Gastroesophageal reflux disease 10/03/2016   Migraine with aura 10/03/2016   Polycystic ovaries 10/03/2016   Post-term pregnancy 06/18/2012   Abnormal Pap smear 12/10/2011   Umbilical hernia 11/29/2011   H/O pyelonephritis 11/29/2011    Allergies: No Known Allergies Medications:  Current Outpatient Medications:     amoxicillin-clavulanate (AUGMENTIN) 875-125 MG tablet, Take 1 tablet by mouth 2 (two) times daily., Disp: 14 tablet, Rfl: 0   fluticasone (FLONASE) 50 MCG/ACT nasal spray, Place 2 sprays into both nostrils daily., Disp: 16 g, Rfl: 0  Observations/Objective: Patient is well-developed, well-nourished in no acute distress.  Resting comfortably at home.  Head is normocephalic, atraumatic.  No labored breathing. Speech is clear and coherent with logical content.  Patient is alert and oriented at baseline.   Assessment and Plan: 1. Acute bacterial sinusitis - fluticasone (FLONASE) 50 MCG/ACT nasal spray; Place 2 sprays into both nostrils daily.  Dispense: 16 g; Refill: 0 - amoxicillin-clavulanate (AUGMENTIN) 875-125 MG tablet; Take 1 tablet by mouth 2 (two) times daily.  Dispense: 14 tablet; Refill: 0  Will have her take home test for COVID to be cautious. Rx Augmentin.  Increase fluids.  Rest.  Saline nasal spray.  Probiotic.  Mucinex as directed.  Humidifier in bedroom. Flonase per orders.  Call or return to clinic if symptoms are not improving.   Follow Up Instructions: I discussed the assessment and treatment plan with the patient. The patient was provided an opportunity to ask questions and all were answered. The patient agreed with the plan and demonstrated an understanding of the instructions.  A copy of instructions were sent to the patient via MyChart unless otherwise noted below.   The patient was advised to call back or seek an in-person evaluation if the symptoms worsen or if the condition fails to improve as anticipated.  Time:  I spent 10 minutes with the patient via telehealth technology discussing the above problems/concerns.    Piedad Climes, PA-C

## 2022-01-18 NOTE — Patient Instructions (Signed)
[facility], thank you for joining Leeanne Rio, PA-C for today's virtual visit.  While this provider is not your primary care provider (PCP), if your PCP is located in our provider database this encounter information will be shared with them immediately following your visit.   [facility] account gives you access to today's visit and all your visits, tests, and labs performed at [facility] " click here if you don't have a [facility] account or go to [email or web address]  Consent: (Patient) Tasha Wu provided verbal consent for this virtual visit at the beginning of the encounter.  Current Medications:  Current Outpatient Medications:    meloxicam (MOBIC) 7.5 MG tablet, Take 1 tablet (7.5 mg total) by mouth daily., Disp: 30 tablet, Rfl: 0   tiZANidine (ZANAFLEX) 4 MG tablet, Take 1 tablet (4 mg total) by mouth every 6 (six) hours as needed for muscle spasms., Disp: 30 tablet, Rfl: 0   Medications ordered in this encounter:  No orders of the defined types were placed in this encounter.    *If you need refills on other medications prior to your next appointment, please contact your pharmacy*  Follow-Up: Call back or seek an in-person evaluation if the symptoms worsen or if the condition fails to improve as anticipated.  [place] [phone]  Other Instructions Please take the home test for COVID as discussed, to be cautious. If positive, please let us know.  Please take antibiotic as directed.  Increase fluid intake.  Use Saline nasal spray.  Take a daily multivitamin. Flonase as directed. You can start OTC Mucinex DM. Ok to take tylenol along with all of this.  Place a humidifier in the bedroom.  Please call or return clinic if symptoms are not improving.  Sinusitis Sinusitis is redness, soreness, and swelling (inflammation) of the paranasal sinuses. Paranasal sinuses are air pockets within the bones of your  face (beneath the eyes, the middle of the forehead, or above the eyes). In healthy paranasal sinuses, mucus is able to drain out, and air is able to circulate through them by way of your nose. However, when your paranasal sinuses are inflamed, mucus and air can become trapped. This can allow bacteria and other germs to grow and cause infection. Sinusitis can develop quickly and last only a short time (acute) or continue over a long period (chronic). Sinusitis that lasts for more than 12 weeks is considered chronic.  CAUSES  Causes of sinusitis include: Allergies. Structural abnormalities, such as displacement of the cartilage that separates your nostrils (deviated septum), which can decrease the air flow through your nose and sinuses and affect sinus drainage. Functional abnormalities, such as when the small hairs (cilia) that line your sinuses and help remove mucus do not work properly or are not present. SYMPTOMS  Symptoms of acute and chronic sinusitis are the same. The primary symptoms are pain and pressure around the affected sinuses. Other symptoms include: Upper toothache. Earache. Headache. Bad breath. Decreased sense of smell and taste. A cough, which worsens when you are lying flat. Fatigue. Fever. Thick drainage from your nose, which often is green and may contain pus (purulent). Swelling and warmth over the affected sinuses. DIAGNOSIS  Your caregiver will perform a physical exam. During the exam, your caregiver may: Look in your nose for signs of abnormal growths in your nostrils (nasal polyps). Tap over the affected sinus to check for signs of infection. View the inside of your sinuses (endoscopy) with a  special imaging device with a light attached (endoscope), which is inserted into your sinuses. If your caregiver suspects that you have chronic sinusitis, one or more of the following tests may be recommended: Allergy tests. Nasal culture A sample of mucus is taken from your  nose and sent to a lab and screened for bacteria. Nasal cytology A sample of mucus is taken from your nose and examined by your caregiver to determine if your sinusitis is related to an allergy. TREATMENT  Most cases of acute sinusitis are related to a viral infection and will resolve on their own within 10 days. Sometimes medicines are prescribed to help relieve symptoms (pain medicine, decongestants, nasal steroid sprays, or saline sprays).  However, for sinusitis related to a bacterial infection, your caregiver will prescribe antibiotic medicines. These are medicines that will help kill the bacteria causing the infection.  Rarely, sinusitis is caused by a fungal infection. In theses cases, your caregiver will prescribe antifungal medicine. For some cases of chronic sinusitis, surgery is needed. Generally, these are cases in which sinusitis recurs more than 3 times per year, despite other treatments. HOME CARE INSTRUCTIONS  Drink plenty of water. Water helps thin the mucus so your sinuses can drain more easily. Use a humidifier. Inhale steam 3 to 4 times a day (for example, sit in the bathroom with the shower running). Apply a warm, moist washcloth to your face 3 to 4 times a day, or as directed by your caregiver. Use saline nasal sprays to help moisten and clean your sinuses. Take over-the-counter or prescription medicines for pain, discomfort, or fever only as directed by your caregiver. SEEK IMMEDIATE MEDICAL CARE IF: You have increasing pain or severe headaches. You have nausea, vomiting, or drowsiness. You have swelling around your face. You have vision problems. You have a stiff neck. You have difficulty breathing. MAKE SURE YOU:  Understand these instructions. Will watch your condition. Will get help right away if you are not doing well or get worse. Document Released: 02/19/2005 Document Revised: 05/14/2011 Document Reviewed: 03/06/2011 [facility] Patient Information 2014  [place], [place].    If you have been instructed to have an in-person evaluation today at a local Urgent Care facility, please use the link below. It will take you to a list of all of our available [facility], including address, phone number and hours of operation. Please do not delay care.  [facility]  If you or a family member do not have a primary care provider, use the link below to schedule a visit and establish care. When you choose a [facility] primary care physician or advanced practice provider, you gain a long-term partner in health. Find a Primary Care Provider  Learn more about [facility] in-office and virtual care options: [facility] - Get Care Now

## 2022-01-29 ENCOUNTER — Telehealth: Payer: Medicaid Other | Admitting: Physician Assistant

## 2022-01-29 DIAGNOSIS — J329 Chronic sinusitis, unspecified: Secondary | ICD-10-CM

## 2022-01-29 NOTE — Progress Notes (Signed)
Because you have failed the 1st line treatment, we do highly recommend for you to be evaluated in person for appropriate treatment. I recommend that you be seen in a face to face visit.   NOTE: There will be NO CHARGE for this eVisit   If you are having a true medical emergency please call 911.      For an urgent face to face visit, [facility] has seven urgent care centers for your convenience:     [facility] Center at [employer] [facility] Directions [phone] [address] [place], [place] [address]    [facility]) Get Driving Directions [phone] [address] [place], [place] [address]  [facility] Health Urgent Care Center [facility]) Get Driving Directions [phone] [address] [place],  [place]  [address]  [facility] Urgent Care Center [facility] - at [employer] Directions  [phone] [address] [place],  [place] [address]   [facility] Urgent Care at [facility] Get Driving Directions [phone] [address] [place], [place] [address]   [facility] Health Urgent Care at [facility] Get Driving Directions  [phone] [address] [place], [place] [address]   [facility] Health Urgent Care at [facility] Directions [phone] [address] [place], [place] [address]  Your MyChart E-visit questionnaire answers were reviewed by a board certified advanced clinical practitioner to complete your personal care plan based on your specific symptoms.  Thank you for using e-Visits.   I have spent 5 minutes in review of e-visit questionnaire, review and updating patient chart, medical decision making and response to patient.   Margaretann Loveless, PA-C

## 2022-01-30 ENCOUNTER — Ambulatory Visit
Admission: RE | Admit: 2022-01-30 | Discharge: 2022-01-30 | Disposition: A | Discharge status: Home / Self Care | Payer: BC Managed Care – PPO | Source: Ambulatory Visit | Attending: Emergency Medicine | Admitting: Emergency Medicine

## 2022-01-30 VITALS — BP 113/79 | HR 84 | Temp 98.8°F | Resp 16

## 2022-01-30 DIAGNOSIS — J309 Allergic rhinitis, unspecified: Secondary | ICD-10-CM

## 2022-01-30 DIAGNOSIS — B349 Viral infection, unspecified: Secondary | ICD-10-CM | POA: Diagnosis not present

## 2022-01-30 DIAGNOSIS — B9689 Other specified bacterial agents as the cause of diseases classified elsewhere: Secondary | ICD-10-CM

## 2022-01-30 MED ORDER — ONDANSETRON 4 MG PO TBDP: 4.0000 mg | ORAL_TABLET | Freq: Three times a day (TID) | ORAL | 0 refills | Status: DC | PRN

## 2022-01-30 MED ORDER — FLUTICASONE PROPIONATE 50 MCG/ACT NA SUSP: 2.0000 | Freq: Every day | NASAL | 2 refills | Status: DC

## 2022-01-30 MED ORDER — CETIRIZINE HCL 10 MG PO TABS: 10.0000 mg | ORAL_TABLET | Freq: Every day | ORAL | 1 refills | Status: DC

## 2022-01-30 NOTE — ED Triage Notes (Signed)
Pt states she was sick last week which had resolved but last night she started with fever, chills,headache and vomiting. States she took a home Covid test that was negative. States she finished Augmentin.

## 2022-01-30 NOTE — ED Provider Notes (Addendum)
[facility]    CSN: [id number] Arrival date & time: 01/30/22  [id number]    HISTORY   Chief Complaint  Patient presents with   Fever    Chills/Fever of 100.6 at 3pm. Vomited. Severe headache. Used "Theraflu" to help with fever. At 8pm, fever was at 99.7. Negative at home COVID test. Headache still present. - Entered by patient   HPI Tasha Wu is a pleasant, 31 y.o. female who presents to urgent care today. Patient states that on November 16 she was evaluated and treated via telehealth for chief complaint of worsening nasal and head congestion, concomitant sinus headache, left frontal and maxillary sinus pain, left ear pressure and pain, some chest congestion dry cough with no reported fever.  Patient was provided with a 7-day course of Augmentin for empiric treatment of presumed acute maxillary sinusitis and advised to use Flonase as well.  Patient states that her symptoms largely resolved after completing 7 days of Augmentin but last night she began to have a fever, Tmax 100.6, chills, headache and a single episode of vomiting.  Patient states she took a home COVID-19 test last night which was negative.  Patient reports history of allergic rhinitis, not currently taking any allergy medication or the Flonase that she was prescribed and advised to take on the 16th.  The history is provided by the patient.   Past Medical History:  Diagnosis Date   Abnormal Pap smear 12/10/2011   Anemia    Anxiety 2010   anxiety attack;no meds;has not been dx'd   Emotional instability ([place])    Break down easily when things are not going her way   GERD (gastroesophageal reflux disease)    History of pulmonary embolus during pregnancy    Hx of joint problems    Shoulders and knees have pain   Infection    Yeast;would get frequently   Infection    BV x 1   Kidney infection 2010   PO antibxs   Kidney stone complicating pregnancy    Migraines    no rx'd meds in the past;goes to sleep    PCOS (polycystic ovarian syndrome)    Sickle cell trait ([place])    Sickle cell trait ([place])    Umbilical hernia [id number]   Does not cause anyp problems   Vaginal Pap smear, abnormal    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   Peripheral neuropathy 09/11/2020   SVD (spontaneous vaginal delivery) 06/25/2020   Normal postpartum course 06/25/2020   Anemia affecting pregnancy in third trimester 06/25/2020   Encounter for induction of labor 06/23/2020   Anemia of pregnancy 04/14/2020   History of pulmonary embolus (PE) 01/15/2020   Pregnant 12/23/2019   Bilateral pulmonary embolism ([place]) 12/04/2019   High-risk pregnancy in first trimester    Anemia 12/21/2017   Atypical squamous cells of undetermined significance (ASC-US) on cervical Pap smear 08/19/2017   Pyelonephritis 07/22/2017   Sickle cell trait ([place]) 07/21/2017   Anxiety 10/03/2016   Gastroesophageal reflux disease 10/03/2016   Migraine with aura 10/03/2016   Polycystic ovaries 10/03/2016   Post-term pregnancy 06/18/2012   Abnormal Pap smear 0000000   Umbilical hernia [id number]   H/O pyelonephritis 11/29/2011   Past Surgical History:  Procedure Laterality Date   DILATION AND CURETTAGE OF UTERUS  [id number]   No complications   IR ANGIOGRAM SELECTIVE  EACH ADDITIONAL VESSEL  12/05/2019   IR ANGIOGRAM SELECTIVE EACH ADDITIONAL VESSEL  12/05/2019   IR INFUSION THROMBOL ARTERIAL INITIAL (MS)  12/05/2019   IR THROMB F/U EVAL ART/VEN FINAL DAY (MS)  12/06/2019   IR US GUIDE VASC ACCESS LEFT  12/05/2019   IR US GUIDE VASC ACCESS LEFT  12/05/2019   WISDOM TOOTH EXTRACTION     OB History     Gravida  5   Para  4   Term  4   Preterm      AB  1   Living  4      SAB      IAB      Ectopic      Multiple  0   Live Births  4          Home Medications    Prior to Admission  medications   Medication Sig Start Date End Date Taking? Authorizing Provider  fluticasone (FLONASE) 50 MCG/ACT nasal spray Place 2 sprays into both nostrils daily. 01/18/22   Brunetta Jeans, PA-C    Family History Family History  Problem Relation Age of Onset   Sickle cell trait Mother    Hypertension Mother    Anemia Mother    Sickle cell trait Brother    Sickle cell anemia Maternal Aunt        Recently deceased from [facility] crisis   Diabetes Maternal Aunt    Sickle cell anemia Maternal Grandmother        Deceased   Heart disease Maternal Aunt    Asthma Sister        1/2 sibling;father's child   Hyperthyroidism Paternal Grandmother    Scoliosis Paternal Grandmother    Arthritis Paternal 41    Migraines Father    Other Father        Joint Problems;shoulders and knees   Hernia Maternal Aunt        Umbilical   Hypertension Maternal Uncle    Diabetes type I Cousin        maternal   Diabetes Paternal Grandfather    Social History Social History   Tobacco Use   Smoking status: Former    Packs/day: 0.25    Years: 5.00    Total pack years: 1.25    Types: Cigarettes    Quit date: 11/18/2011    Years since quitting: 10.2    Passive exposure: Never   Smokeless tobacco: Never  Vaping Use   Vaping Use: Never used  Substance Use Topics   Alcohol use: Not Currently    Comment: occ   Drug use: No   Allergies   Patient has no known allergies.  Review of Systems Review of Systems Pertinent findings revealed after performing a 14 point review of systems has been noted in the history of present illness.  Physical Exam Triage Vital Signs ED Triage Vitals  Enc Vitals Group     BP 12/30/20 0827 (!) 147/82     Pulse Rate 12/30/20 0827 72     Resp 12/30/20 0827 18     Temp 12/30/20 0827 98.3 F (36.8 C)     Temp Source 12/30/20 0827 Oral     SpO2 12/30/20 0827 98 %     Weight --      Height --      Head Circumference --      Peak Flow --      Pain Score  12/30/20 0826 5     Pain Loc --  Pain Edu? --      Excl. in GC? --   No data found.  Updated Vital Signs BP 113/79 (BP Location: Right Arm)   Pulse 84   Temp 98.8 F (37.1 C) (Oral)   Resp 16   LMP 12/23/2021 (Approximate)   SpO2 96%   Physical Exam Vitals and nursing note reviewed.  Constitutional:      General: She is not in acute distress.    Appearance: Normal appearance. She is not ill-appearing.  HENT:     Head: Normocephalic and atraumatic.     Salivary Glands: Right salivary gland is not diffusely enlarged or tender. Left salivary gland is not diffusely enlarged or tender.     Right Ear: Ear canal and external ear normal. No drainage. A middle ear effusion is present. There is no impacted cerumen. Tympanic membrane is bulging. Tympanic membrane is not injected or erythematous.     Left Ear: Ear canal and external ear normal. No drainage. A middle ear effusion is present. There is no impacted cerumen. Tympanic membrane is bulging. Tympanic membrane is not injected or erythematous.     Ears:     Comments: Bilateral EACs normal, both TMs bulging with clear fluid    Nose: Rhinorrhea present. No nasal deformity, septal deviation, signs of injury, nasal tenderness, mucosal edema or congestion. Rhinorrhea is clear.     Right Nostril: Occlusion present. No foreign body, epistaxis or septal hematoma.     Left Nostril: Occlusion present. No foreign body, epistaxis or septal hematoma.     Right Turbinates: Enlarged, swollen and pale.     Left Turbinates: Enlarged, swollen and pale.     Right Sinus: No maxillary sinus tenderness or frontal sinus tenderness.     Left Sinus: No maxillary sinus tenderness or frontal sinus tenderness.     Mouth/Throat:     Lips: Pink. No lesions.     Mouth: Mucous membranes are moist. No oral lesions.     Pharynx: Oropharynx is clear. Uvula midline. No posterior oropharyngeal erythema or uvula swelling.     Tonsils: No tonsillar exudate. 0 on the  right. 0 on the left.     Comments: Postnasal drip Eyes:     General: Lids are normal.        Right eye: No discharge.        Left eye: No discharge.     Extraocular Movements: Extraocular movements intact.     Conjunctiva/sclera: Conjunctivae normal.     Right eye: Right conjunctiva is not injected.     Left eye: Left conjunctiva is not injected.  Neck:     Trachea: Trachea and phonation normal.  Cardiovascular:     Rate and Rhythm: Normal rate and regular rhythm.     Pulses: Normal pulses.     Heart sounds: Normal heart sounds. No murmur heard.    No friction rub. No gallop.  Pulmonary:     Effort: Pulmonary effort is normal. No accessory muscle usage, prolonged expiration or respiratory distress.     Breath sounds: Normal breath sounds. No stridor, decreased air movement or transmitted upper airway sounds. No decreased breath sounds, wheezing, rhonchi or rales.  Chest:     Chest wall: No tenderness.  Musculoskeletal:        General: Normal range of motion.     Cervical back: Normal range of motion and neck supple. Normal range of motion.  Lymphadenopathy:     Cervical: No cervical adenopathy.  Skin:  General: Skin is warm and dry.     Findings: No erythema or rash.  Neurological:     General: No focal deficit present.     Mental Status: She is alert and oriented to person, place, and time.  Psychiatric:        Mood and Affect: Mood normal.        Behavior: Behavior normal.     Visual Acuity Right Eye Distance:   Left Eye Distance:   Bilateral Distance:    Right Eye Near:   Left Eye Near:    Bilateral Near:     UC Couse / Diagnostics / Procedures:     Radiology No results found.  Procedures Procedures (including critical care time) EKG  Pending results:  Labs Reviewed - No data to display  Medications Ordered in UC: Medications - No data to display  UC Diagnoses / Final Clinical Impressions(s)   I have reviewed the triage vital signs and the  nursing notes.  Pertinent labs & imaging results that were available during my care of the patient were reviewed by me and considered in my medical decision making (see chart for details).    Final diagnoses:  Viral illness  Allergic rhinitis, unspecified seasonality, unspecified trigger   Patient advised that physical exam findings are consistent with viral illness which is likely secondary to uncontrolled allergies.  Patient provided with allergy medication prescriptions which I recommend that she take daily.  Patient advised to take a COVID-19 test either tomorrow or the next day to confirm her negative result from yesterday.  Patient provided with a prescription for Zofran for nausea.  Return precautions advised.  ED Prescriptions     Medication Sig Dispense Auth. Provider   fluticasone (FLONASE) 50 MCG/ACT nasal spray Place 2 sprays into both nostrils daily. 16 g Lynden Oxford Scales, PA-C   cetirizine (ZYRTEC ALLERGY) 10 MG tablet Take 1 tablet (10 mg total) by mouth at bedtime. 90 tablet Lynden Oxford Scales, PA-C   ondansetron (ZOFRAN-ODT) 4 MG disintegrating tablet Take 1 tablet (4 mg total) by mouth every 8 (eight) hours as needed for nausea or vomiting. 20 tablet Lynden Oxford Scales, PA-C      PDMP not reviewed this encounter.  Disposition Upon Discharge:  Condition: stable for discharge home Home: take medications as prescribed; routine discharge instructions as discussed; follow up as advised.  Patient presented with an acute illness with associated systemic symptoms and significant discomfort requiring urgent management. In my opinion, this is a condition that a prudent lay person (someone who possesses an average knowledge of health and medicine) may potentially expect to result in complications if not addressed urgently such as respiratory distress, impairment of bodily function or dysfunction of bodily organs.   Routine symptom specific, illness specific and/or  disease specific instructions were discussed with the patient and/or caregiver at length.   As such, the patient has been evaluated and assessed, work-up was performed and treatment was provided in alignment with urgent care protocols and evidence based medicine.  Patient/parent/caregiver has been advised that the patient may require follow up for further testing and treatment if the symptoms continue in spite of treatment, as clinically indicated and appropriate.  If the patient was tested for COVID-19, Influenza and/or RSV, then the patient/parent/guardian was advised to isolate at home pending the results of his/her diagnostic coronavirus test and potentially longer if they're positive. I have also advised pt that if his/her COVID-19 test returns positive, it's recommended to self-isolate for at  least 10 days after symptoms first appeared AND until fever-free for 24 hours without fever reducer AND other symptoms have improved or resolved. Discussed self-isolation recommendations as well as instructions for household member/close contacts as per the [facility] and [place] [facility], and also gave patient the [place] packet with this information.  Patient/parent/caregiver has been advised to return to the [facility] or PCP in 3-5 days if no better; to PCP or the Emergency Department if new signs and symptoms develop, or if the current signs or symptoms continue to change or worsen for further workup, evaluation and treatment as clinically indicated and appropriate  The patient will follow up with their current PCP if and as advised. If the patient does not currently have a PCP we will assist them in obtaining one.   The patient may need specialty follow up if the symptoms continue, in spite of conservative treatment and management, for further workup, evaluation, consultation and treatment as clinically indicated and appropriate.  Patient/parent/caregiver verbalized understanding and agreement of plan as discussed.  All  questions were addressed during visit.  Please see discharge instructions below for further details of plan.  Discharge Instructions:   Discharge Instructions      Your symptoms and physical exam findings are concerning for a viral respiratory infection.  Because respiratory allergies are not well controlled at this time, this makes you more susceptible to catching respiratory infections.  I recommend that you repeat a COVID-19 test at home tomorrow the next day to confirm the negative result.   Please read below to learn more about the medications, dosages and frequencies that I recommend to help alleviate your symptoms and to get you feeling better soon:   Zyrtec (cetirizine): This is an excellent second-generation antihistamine that helps to reduce respiratory inflammatory response to environmental allergens.  In some patients, this medication can cause daytime sleepiness so I recommend that you take 1 tablet daily at bedtime.     Flonase (fluticasone): This is a steroid nasal spray that you use once daily, 1 spray in each nare.  This medication does not work well if you decide to use it only used as you feel you need to, it works best used on a daily basis.  After 3 to 5 days of use, you will notice significant reduction of the inflammation and mucus production that is currently being caused by exposure to allergens, whether seasonal or environmental.  The most common side effect of this medication is nosebleeds.  If you experience a nosebleed, please discontinue use for 1 week, then feel free to resume.  I have provided you with a prescription.    Zofran (ondansetron): This is a good antinausea medication that you can take every 8 hours as needed for symptoms of nausea and vomiting.  It is a dissolvable tablet that you do not need to take with water, just put it under your tongue and let it melt away.  I have sent a prescription to your pharmacy.  Advil, Motrin (ibuprofen): This is a good  anti-inflammatory medication which not only addresses aches, pains but also significantly reduces soft tissue inflammation of the upper airways that causes sinus and nasal congestion as well as inflammation of the lower airways which makes you feel like your breathing is constricted or your cough feel tight.  I recommend that you take 400 mg every 8 hours as needed.       Robitussin, Mucinex (guaifenesin): This is an expectorant.  This helps break up chest congestion and  loosen up thick nasal drainage making phlegm and drainage more liquid and therefore easier to remove.  I recommend being 400 mg three times daily as needed.      Please follow-up within the next 5-7 days either with your primary care provider or urgent care if your symptoms do not resolve.  If you do not have a primary care provider, we will assist you in finding one.        Thank you for visiting urgent care today.  We appreciate the opportunity to participate in your care.         This office note has been dictated using Teaching laboratory technician.  Unfortunately, this method of dictation can sometimes lead to typographical or grammatical errors.  I apologize for your inconvenience in advance if this occurs.  Please do not hesitate to reach out to me if clarification is needed.      Theadora Rama Scales, PA-C 01/30/22 1033    Theadora Rama Scales, PA-C 01/30/22 1035

## 2022-01-30 NOTE — Discharge Instructions (Addendum)
Your symptoms and physical exam findings are concerning for a viral respiratory infection.  Because respiratory allergies are not well controlled at this time, this makes you more susceptible to catching respiratory infections.  I recommend that you repeat a COVID-19 test at home tomorrow the next day to confirm the negative result.   Please read below to learn more about the medications, dosages and frequencies that I recommend to help alleviate your symptoms and to get you feeling better soon:   Zyrtec (cetirizine): This is an excellent second-generation antihistamine that helps to reduce respiratory inflammatory response to environmental allergens.  In some patients, this medication can cause daytime sleepiness so I recommend that you take 1 tablet daily at bedtime.     Flonase (fluticasone): This is a steroid nasal spray that you use once daily, 1 spray in each nare.  This medication does not work well if you decide to use it only used as you feel you need to, it works best used on a daily basis.  After 3 to 5 days of use, you will notice significant reduction of the inflammation and mucus production that is currently being caused by exposure to allergens, whether seasonal or environmental.  The most common side effect of this medication is nosebleeds.  If you experience a nosebleed, please discontinue use for 1 week, then feel free to resume.  I have provided you with a prescription.    Zofran (ondansetron): This is a good antinausea medication that you can take every 8 hours as needed for symptoms of nausea and vomiting.  It is a dissolvable tablet that you do not need to take with water, just put it under your tongue and let it melt away.  I have sent a prescription to your pharmacy.  Advil, Motrin (ibuprofen): This is a good anti-inflammatory medication which not only addresses aches, pains but also significantly reduces soft tissue inflammation of the upper airways that causes sinus and nasal  congestion as well as inflammation of the lower airways which makes you feel like your breathing is constricted or your cough feel tight.  I recommend that you take 400 mg every 8 hours as needed.       Robitussin, Mucinex (guaifenesin): This is an expectorant.  This helps break up chest congestion and loosen up thick nasal drainage making phlegm and drainage more liquid and therefore easier to remove.  I recommend being 400 mg three times daily as needed.      Please follow-up within the next 5-7 days either with your primary care provider or urgent care if your symptoms do not resolve.  If you do not have a primary care provider, we will assist you in finding one.        Thank you for visiting urgent care today.  We appreciate the opportunity to participate in your care.

## 2022-04-11 ENCOUNTER — Telehealth: Payer: BC Managed Care – PPO | Admitting: Physician Assistant

## 2022-04-11 DIAGNOSIS — B9689 Other specified bacterial agents as the cause of diseases classified elsewhere: Secondary | ICD-10-CM

## 2022-04-11 DIAGNOSIS — N760 Acute vaginitis: Secondary | ICD-10-CM | POA: Diagnosis not present

## 2022-04-12 MED ORDER — METRONIDAZOLE 500 MG PO TABS
500.0000 mg | ORAL_TABLET | Freq: Two times a day (BID) | ORAL | 0 refills | Status: DC
Start: 2022-04-12 — End: 2022-09-05

## 2022-04-12 NOTE — Progress Notes (Signed)

## 2022-04-12 NOTE — Progress Notes (Signed)
I have spent 5 minutes in review of e-visit questionnaire, review and updating patient chart, medical decision making and response to patient.   Santo Zahradnik Cody Joice Nazario, PA-C    

## 2022-05-12 ENCOUNTER — Telehealth: Payer: BC Managed Care – PPO | Admitting: Family

## 2022-05-12 DIAGNOSIS — J069 Acute upper respiratory infection, unspecified: Secondary | ICD-10-CM | POA: Diagnosis not present

## 2022-05-12 MED ORDER — FLUTICASONE PROPIONATE 50 MCG/ACT NA SUSP: 2.0000 | Freq: Every day | NASAL | 6 refills | Status: AC

## 2022-05-12 MED ORDER — CETIRIZINE HCL 10 MG PO TABS: 10.0000 mg | ORAL_TABLET | Freq: Every day | ORAL | 1 refills | Status: DC

## 2022-05-12 NOTE — Progress Notes (Signed)

## 2022-05-15 ENCOUNTER — Ambulatory Visit (INDEPENDENT_AMBULATORY_CARE_PROVIDER_SITE_OTHER): Payer: BC Managed Care – PPO

## 2022-05-15 ENCOUNTER — Ambulatory Visit
Admission: EM | Admit: 2022-05-15 | Discharge: 2022-05-15 | Disposition: A | Discharge status: Home / Self Care | Payer: Medicaid Other | Attending: Urgent Care | Admitting: Urgent Care

## 2022-05-15 DIAGNOSIS — R059 Cough, unspecified: Secondary | ICD-10-CM | POA: Diagnosis not present

## 2022-05-15 DIAGNOSIS — R0602 Shortness of breath: Secondary | ICD-10-CM

## 2022-05-15 DIAGNOSIS — R079 Chest pain, unspecified: Secondary | ICD-10-CM | POA: Diagnosis not present

## 2022-05-15 DIAGNOSIS — B349 Viral infection, unspecified: Secondary | ICD-10-CM

## 2022-05-15 MED ORDER — PROMETHAZINE-DM 6.25-15 MG/5ML PO SYRP: 5.0000 mL | ORAL_SOLUTION | Freq: Three times a day (TID) | ORAL | 0 refills | Status: DC | PRN

## 2022-05-15 MED ORDER — PREDNISONE 20 MG PO TABS: ORAL_TABLET | ORAL | 0 refills | Status: DC

## 2022-05-15 NOTE — Discharge Instructions (Signed)
We will manage this as a viral syndrome. For sore throat or cough try using a honey-based tea. Use 3 teaspoons of honey with juice squeezed from half lemon. Place shaved pieces of ginger into 1/2-1 cup of water and warm over stove top. Then mix the ingredients and repeat every 4 hours as needed. Please take Tylenol '500mg'$ -'650mg'$  once every 6 hours for fevers, aches and pains. Hydrate very well with at least 2 liters (64 ounces) of water. Eat light meals such as soups (chicken and noodles, chicken wild rice, vegetable).  Do not eat any foods that you are allergic to.  Start an antihistamine like Zyrtec ('10mg'$  daily) for postnasal drainage, sinus congestion.  Go ahead and start prednisone to help as a respiratory boost. After you finish prednisone in 5 days, you can switch to pseudoephedrine at a dose of 60 mg 3 times a day or twice daily as needed for the same kind of congestion.  Use the cough medication as needed.

## 2022-05-15 NOTE — ED Triage Notes (Signed)
Pt cough, chest congestion, sore throat x 5 days-taking zyrtec and motrin-NAD-steady gait

## 2022-05-15 NOTE — ED Provider Notes (Signed)
[facility]  Note:  This document was prepared using Systems analyst and may include unintentional dictation errors.  MRN: [id number] DOB: 1991/01/05  Subjective:   Tasha Wu is a 32 y.o. female presenting for 5-day history of acute onset coughing, chest congestion with shortness of breath and throat pain, drainage.  Patient has been using Zyrtec and Motrin.  Has a history of pulmonary embolism, DVT about 2 years ago.  No current anticoagulation.  Reports that she believes it was unprovoked as no one definitively told her source for the PE.  No history of asthma but admits that she is very prone to respiratory illnesses.  No smoking of any kind including cigarettes, cigars, vaping, marijuana use.    No current facility-administered medications for this encounter.  Current Outpatient Medications:    cetirizine (ZYRTEC ALLERGY) 10 MG tablet, Take 1 tablet (10 mg total) by mouth daily., Disp: 90 tablet, Rfl: 1   fluticasone (FLONASE) 50 MCG/ACT nasal spray, Place 2 sprays into both nostrils daily., Disp: 16 g, Rfl: 6   metroNIDAZOLE (FLAGYL) 500 MG tablet, Take 1 tablet (500 mg total) by mouth 2 (two) times daily., Disp: 14 tablet, Rfl: 0   ondansetron (ZOFRAN-ODT) 4 MG disintegrating tablet, Take 1 tablet (4 mg total) by mouth every 8 (eight) hours as needed for nausea or vomiting., Disp: 20 tablet, Rfl: 0   No Known Allergies  Past Medical History:  Diagnosis Date   Abnormal Pap smear 12/10/2011   Anemia    Anxiety 2010   anxiety attack;no meds;has not been dx'd   Emotional instability ([place])    Break down easily when things are not going her way   GERD (gastroesophageal reflux disease)    History of pulmonary embolus during pregnancy    Hx of joint problems    Shoulders and knees have pain   Infection    Yeast;would get frequently   Infection    BV x 1   Kidney infection 2010   PO antibxs   Kidney stone complicating pregnancy     Migraines    no rx'd meds in the past;goes to sleep   PCOS (polycystic ovarian syndrome)    Sickle cell trait (HCC)    Sickle cell trait ([place])    Umbilical hernia [id number]   Does not cause anyp problems   Vaginal Pap smear, abnormal      Past Surgical History:  Procedure Laterality Date   DILATION AND CURETTAGE OF UTERUS  [id number]   No complications   IR ANGIOGRAM SELECTIVE EACH ADDITIONAL VESSEL  12/05/2019   IR ANGIOGRAM SELECTIVE EACH ADDITIONAL VESSEL  12/05/2019   IR INFUSION THROMBOL ARTERIAL INITIAL (MS)  12/05/2019   IR THROMB F/U EVAL ART/VEN FINAL DAY (MS)  12/06/2019   IR US GUIDE VASC ACCESS LEFT  12/05/2019   IR US GUIDE VASC ACCESS LEFT  12/05/2019   WISDOM TOOTH EXTRACTION      Family History  Problem Relation Age of Onset   Sickle cell trait Mother    Hypertension Mother    Anemia Mother    Sickle cell trait Brother    Sickle cell anemia Maternal Aunt        Recently deceased from [place] crisis   Diabetes Maternal Aunt    Sickle cell anemia Maternal Grandmother        Deceased   Heart disease Maternal Aunt    Asthma Sister        1/2 sibling;father's child  Hyperthyroidism Paternal Grandmother    Scoliosis Paternal Grandmother    Arthritis Paternal 6    Migraines Father    Other Father        Joint Problems;shoulders and knees   Hernia Maternal Aunt        Umbilical   Hypertension Maternal Uncle    Diabetes type I Cousin        maternal   Diabetes Paternal Grandfather     Social History   Tobacco Use   Smoking status: Former    Packs/day: 0.25    Years: 5.00    Total pack years: 1.25    Types: Cigarettes    Quit date: 11/18/2011    Years since quitting: 10.4    Passive exposure: Never   Smokeless tobacco: Never  Vaping Use   Vaping Use: Never used  Substance Use Topics   Alcohol use: Not Currently   Drug use: No    ROS   Objective:   Vitals: BP 115/78 (BP Location: Right Arm)   Pulse 88   Temp 98.7 F (37.1 C) (Oral)   Resp 20    LMP 04/13/2022   SpO2 98%   Physical Exam Constitutional:      General: She is not in acute distress.    Appearance: Normal appearance. She is well-developed and normal weight. She is not ill-appearing, toxic-appearing or diaphoretic.  HENT:     Head: Normocephalic and atraumatic.     Right Ear: Tympanic membrane, ear canal and external ear normal. No drainage or tenderness. No middle ear effusion. There is no impacted cerumen. Tympanic membrane is not erythematous or bulging.     Left Ear: Tympanic membrane, ear canal and external ear normal. No drainage or tenderness.  No middle ear effusion. There is no impacted cerumen. Tympanic membrane is not erythematous or bulging.     Nose: Nose normal. No congestion or rhinorrhea.     Mouth/Throat:     Mouth: Mucous membranes are moist. No oral lesions.     Pharynx: No pharyngeal swelling, oropharyngeal exudate, posterior oropharyngeal erythema or uvula swelling.     Tonsils: No tonsillar exudate or tonsillar abscesses.  Eyes:     General: No scleral icterus.       Right eye: No discharge.        Left eye: No discharge.     Extraocular Movements: Extraocular movements intact.     Right eye: Normal extraocular motion.     Left eye: Normal extraocular motion.     Conjunctiva/sclera: Conjunctivae normal.  Cardiovascular:     Rate and Rhythm: Normal rate and regular rhythm.     Heart sounds: Normal heart sounds. No murmur heard.    No friction rub. No gallop.  Pulmonary:     Effort: Pulmonary effort is normal. No respiratory distress.     Breath sounds: No stridor. No wheezing, rhonchi or rales.  Chest:     Chest wall: No tenderness.  Musculoskeletal:     Cervical back: Normal range of motion and neck supple.  Lymphadenopathy:     Cervical: No cervical adenopathy.  Skin:    General: Skin is warm and dry.  Neurological:     General: No focal deficit present.     Mental Status: She is alert and oriented to person, place, and time.   Psychiatric:        Mood and Affect: Mood normal.        Behavior: Behavior normal.    DG Chest 2  View  Result Date: 05/15/2022 CLINICAL DATA:  Cough, chest pain and shortness of breath. History of PE. EXAM: CHEST - 2 VIEW COMPARISON:  12/05/2019. FINDINGS: Clear lungs. Normal heart size and mediastinal contours. No pleural effusion or pneumothorax. Visualized bones and upper abdomen are unremarkable. IMPRESSION: No evidence of acute cardiopulmonary disease. Electronically Signed   By: Emmit Alexanders M.D.   On: 05/15/2022 11:02    Assessment and Plan :   PDMP not reviewed this encounter.  1. Acute viral syndrome   2. Shortness of breath     At this stage I do not believe that patient needs workup for rule out of recurrent pulmonary embolism.  Will manage aggressively with prednisone as a respiratory boost for an acute viral syndrome.  Given timeline of her illness, deferred COVID and flu testing.  Chest x-ray was negative.  Use supportive care otherwise. Counseled patient on potential for adverse effects with medications prescribed/recommended today, ER and return-to-clinic precautions discussed, patient verbalized understanding.    Jaynee Eagles, [place] 05/15/22 1127

## 2022-06-13 ENCOUNTER — Telehealth: Payer: Medicaid Other | Admitting: Emergency Medicine

## 2022-06-13 DIAGNOSIS — K6289 Other specified diseases of anus and rectum: Secondary | ICD-10-CM

## 2022-06-13 MED ORDER — HYDROCORTISONE 1 % EX CREA: 1.0000 | TOPICAL_CREAM | Freq: Two times a day (BID) | CUTANEOUS | 0 refills | Status: DC

## 2022-06-13 MED ORDER — HYDROCORTISONE ACETATE 25 MG RE SUPP: 25.0000 mg | Freq: Two times a day (BID) | RECTAL | 0 refills | Status: DC

## 2022-06-13 NOTE — Progress Notes (Signed)
E-Visit for Hemorrhoid  We are sorry that you are not feeling well. We are here to help!  Hemorrhoids are swollen veins in the rectum. They can cause itching, bleeding, and pain. Hemorrhoids are very common.  In some cases, you can see or feel hemorrhoids around the outside of the rectum. In other cases, you cannot see them because they are hidden inside the rectum. Be patient - It can take months for this to improve or go away.   Hemorrhoids do not always cause symptoms. But when they do, symptoms can include: ?Itching of the skin around the anus ?Bleeding - Bleeding is usually painless. You might see bright red blood after using the toilet. ?Pain - If a blood clot forms inside a hemorrhoid, this can cause pain. It can also cause a lump that you might be able to feel.   What can I do to keep from getting more hemorrhoids? -- The most important thing you can do is to keep from getting constipated. You should have a bowel movement at least a few times a week. When you have a bowel movement, you also should not have to push too much. Plus, your bowel movements should not be too hard. Being constipated and having hard bowel movements can make hemorrhoids worse.   Continue using Preparation H.  There are also suppositories that are available that can help.  I've sent Rx's for both for convenience, but both are available over the counter as well.  HOME CARE: Sitz Baths twice daily. Soak buttocks in 2 or 3 inches of warm water for 10 to 15 minutes. Do not add soap, bubble bath, or anything to the water. Stool softener such as Colace 100 mg twice daily AND Miralax 1 scoop daily until you have regular soft stools Over the counter Preparation H Tucks Pads Witch Hazel  Here are some steps you can take to avoid getting constipated or having hard stools:  ?Eat lots of fruits, vegetables, and other foods with fiber. Fiber helps to increase bowel movements. If you do not get enough fiber from your diet,  you can take fiber supplements. These come in the form of powders, wafers, or pills. Some examples are Metamucil, Citrucel, Benefiber and FiberCon. If you take a fiber supplement, be sure to read the label so you know how much to take. If you're not sure, ask your provider or nurse. ?Take medicines called "stool softeners" such as docusate sodium (sample brand names: Colace, Dulcolax). These medicines increase the number of bowel movements you have. They are safe to take and they can prevent problems later.  You should request a referral for a follow up evaluation with a Gastroenterologist (GI doctor) to evaluate this chronic and relapsing condition - even if it improves to see what further steps need to be taken. This is highly linked to chronic constipation and straining to have a bowel movement. It may require further treatment or surgical intervention.   GET HELP RIGHT AWAY IF: You develop severe pain You have heavy bleeding   FOLLOW UP WITH YOUR PRIMARY PROVIDER IF: If your symptoms do not improve within 10 days  MAKE SURE YOU  Understand these instructions. Will watch your condition. Will get help right away if you are not doing well or get worse.   Thank you for choosing an e-visit.  Your e-visit answers were reviewed by a board certified advanced clinical practitioner to complete your personal care plan. Depending upon the condition, your plan could have included  both over the counter or prescription medications.  Please review your pharmacy choice. Make sure the pharmacy is open so you can pick up prescription now. If there is a problem, you may contact your provider through [employer] and have the prescription routed to another pharmacy.  Your safety is important to [place]. If you have drug allergies check your prescription carefully.   For the next 24 hours you can use MyChart to ask questions about today's visit, request a non-urgent call back, or ask for a work or school  excuse. You will get an email in the next two days asking about your experience. I hope that your e-visit has been valuable and will speed your recovery.  Approximately 5 minutes was used in reviewing the patient's chart, questionnaire, prescribing medications, and documentation.

## 2022-07-17 ENCOUNTER — Telehealth: Payer: Medicaid Other | Admitting: Nurse Practitioner

## 2022-07-17 DIAGNOSIS — R110 Nausea: Secondary | ICD-10-CM

## 2022-07-17 DIAGNOSIS — J029 Acute pharyngitis, unspecified: Secondary | ICD-10-CM

## 2022-07-17 MED ORDER — ONDANSETRON HCL 4 MG PO TABS: 4.0000 mg | ORAL_TABLET | Freq: Three times a day (TID) | ORAL | 0 refills | Status: DC | PRN

## 2022-07-17 NOTE — Progress Notes (Signed)
E-Visit for Sore Throat  We are sorry that you are not feeling well.  Here is how we plan to help!  Strep throat is rare in adults without known exposure and without fever.  I would recommend taking a home COVID test if possible.   Your symptoms indicate a likely viral infection (Pharyngitis).   Pharyngitis is inflammation in the back of the throat which can cause a sore throat, scratchiness and sometimes difficulty swallowing.   Pharyngitis is typically caused by a respiratory virus and will just run its course.  Please keep in mind that your symptoms could last up to 10 days.  For throat pain, we recommend over the counter oral pain relief medications such as acetaminophen or aspirin, or anti-inflammatory medications such as ibuprofen or naproxen sodium.  Topical treatments such as oral throat lozenges or sprays may be used as needed.  Avoid close contact with loved ones, especially the very young and elderly.  Remember to wash your hands thoroughly throughout the day as this is the number one way to prevent the spread of infection and wipe down door knobs and counters with disinfectant.  After careful review of your answers, I would not recommend an antibiotic for your condition.  Antibiotics should not be used to treat conditions that we suspect are caused by viruses like the virus that causes the common cold or flu. However, some people can have Strep with atypical symptoms. You may need formal testing in clinic or office to confirm if your symptoms continue or worsen.  Providers prescribe antibiotics to treat infections caused by bacteria. Antibiotics are very powerful in treating bacterial infections when they are used properly.  To maintain their effectiveness, they should be used only when necessary.  Overuse of antibiotics has resulted in the development of super bugs that are resistant to treatment!    Home Care: Only take medications as instructed by your medical team. Do not drink  alcohol while taking these medications. A steam or ultrasonic humidifier can help congestion.  You can place a towel over your head and breathe in the steam from hot water coming from a faucet. Avoid close contacts especially the very young and the elderly. Cover your mouth when you cough or sneeze. Always remember to wash your hands.  Get Help Right Away If: You develop worsening fever or throat pain. You develop a severe head ache or visual changes. Your symptoms persist after you have completed your treatment plan.  Make sure you Understand these instructions. Will watch your condition. Will get help right away if you are not doing well or get worse.   Thank you for choosing an e-visit.  Your e-visit answers were reviewed by a board certified advanced clinical practitioner to complete your personal care plan. Depending upon the condition, your plan could have included both over the counter or prescription medications.  Please review your pharmacy choice. Make sure the pharmacy is open so you can pick up prescription now. If there is a problem, you may contact your provider through [employer] and have the prescription routed to another pharmacy.  Your safety is important to [place]. If you have drug allergies check your prescription carefully.   For the next 24 hours you can use MyChart to ask questions about today's visit, request a non-urgent call back, or ask for a work or school excuse. You will get an email in the next two days asking about your experience. I hope that your e-visit has been valuable and  will speed your recovery.   I spent approximately 5 minutes reviewing the patient's history, current symptoms and coordinating their care today.

## 2022-07-17 NOTE — Addendum Note (Signed)
Addended by: Viviano Simas E on: 07/17/2022 08:03 AM   Modules accepted: Orders

## 2022-07-27 ENCOUNTER — Telehealth: Payer: Medicaid Other | Admitting: Family Medicine

## 2022-07-27 DIAGNOSIS — R197 Diarrhea, unspecified: Secondary | ICD-10-CM

## 2022-07-27 NOTE — Progress Notes (Signed)

## 2022-08-01 ENCOUNTER — Telehealth: Payer: Medicaid Other | Admitting: Physician Assistant

## 2022-08-01 DIAGNOSIS — J208 Acute bronchitis due to other specified organisms: Secondary | ICD-10-CM

## 2022-08-02 MED ORDER — PREDNISONE 20 MG PO TABS
40.0000 mg | ORAL_TABLET | Freq: Every day | ORAL | 0 refills | Status: DC
Start: 2022-08-02 — End: 2022-09-05

## 2022-08-02 MED ORDER — BENZONATATE 100 MG PO CAPS
100.0000 mg | ORAL_CAPSULE | Freq: Three times a day (TID) | ORAL | 0 refills | Status: DC | PRN
Start: 2022-08-02 — End: 2024-01-09

## 2022-08-02 NOTE — Progress Notes (Signed)
I have spent 5 minutes in review of e-visit questionnaire, review and updating patient chart, medical decision making and response to patient.   Niv Darley Cody Dahna Hattabaugh, PA-C    

## 2022-08-02 NOTE — Progress Notes (Signed)
E-Visit for Cough  We are sorry that you are not feeling well.  Here is how we plan to help!  Based on your presentation I believe you most likely have A cough due to a virus.  This is called viral bronchitis and is best treated by rest, plenty of fluids and control of the cough.  You may use Ibuprofen or Tylenol as directed to help your symptoms.     In addition I have prescribed  A prescription cough medication called Tessalon Perles 100mg . You may take 1-2 capsules every 8 hours as needed for your cough. I have also sent in a short course of prednisone to take as directed.    From your responses in the eVisit questionnaire you describe inflammation in the upper respiratory tract which is causing a significant cough.  This is commonly called Bronchitis and has four common causes:   Allergies Viral Infections Acid Reflux Bacterial Infection Allergies, viruses and acid reflux are treated by controlling symptoms or eliminating the cause. An example might be a cough caused by taking certain blood pressure medications. You stop the cough by changing the medication. Another example might be a cough caused by acid reflux. Controlling the reflux helps control the cough.  USE OF BRONCHODILATOR ("RESCUE") INHALERS: There is a risk from using your bronchodilator too frequently.  The risk is that over-reliance on a medication which only relaxes the muscles surrounding the breathing tubes can reduce the effectiveness of medications prescribed to reduce swelling and congestion of the tubes themselves.  Although you feel brief relief from the bronchodilator inhaler, your asthma may actually be worsening with the tubes becoming more swollen and filled with mucus.  This can delay other crucial treatments, such as oral steroid medications. If you need to use a bronchodilator inhaler daily, several times per day, you should discuss this with your provider.  There are probably better treatments that could be used to  keep your asthma under control.     HOME CARE Only take medications as instructed by your medical team. Complete the entire course of an antibiotic. Drink plenty of fluids and get plenty of rest. Avoid close contacts especially the very young and the elderly Cover your mouth if you cough or cough into your sleeve. Always remember to wash your hands A steam or ultrasonic humidifier can help congestion.   GET HELP RIGHT AWAY IF: You develop worsening fever. You become short of breath You cough up blood. Your symptoms persist after you have completed your treatment plan MAKE SURE YOU  Understand these instructions. Will watch your condition. Will get help right away if you are not doing well or get worse.    Thank you for choosing an e-visit.  Your e-visit answers were reviewed by a board certified advanced clinical practitioner to complete your personal care plan. Depending upon the condition, your plan could have included both over the counter or prescription medications.  Please review your pharmacy choice. Make sure the pharmacy is open so you can pick up prescription now. If there is a problem, you may contact your provider through [employer] and have the prescription routed to another pharmacy.  Your safety is important to [place]. If you have drug allergies check your prescription carefully.   For the next 24 hours you can use MyChart to ask questions about today's visit, request a non-urgent call back, or ask for a work or school excuse. You will get an email in the next two days asking about  your experience. I hope that your e-visit has been valuable and will speed your recovery.

## 2022-09-05 ENCOUNTER — Ambulatory Visit
Admission: EM | Admit: 2022-09-05 | Discharge: 2022-09-05 | Disposition: A | Discharge status: Home / Self Care | Payer: 59 | Attending: Physician Assistant | Admitting: Physician Assistant

## 2022-09-05 ENCOUNTER — Telehealth: Payer: Medicaid Other | Admitting: Family Medicine

## 2022-09-05 DIAGNOSIS — R1030 Lower abdominal pain, unspecified: Secondary | ICD-10-CM | POA: Insufficient documentation

## 2022-09-05 DIAGNOSIS — N730 Acute parametritis and pelvic cellulitis: Secondary | ICD-10-CM | POA: Insufficient documentation

## 2022-09-05 DIAGNOSIS — R8290 Unspecified abnormal findings in urine: Secondary | ICD-10-CM | POA: Insufficient documentation

## 2022-09-05 DIAGNOSIS — R300 Dysuria: Secondary | ICD-10-CM

## 2022-09-05 DIAGNOSIS — N898 Other specified noninflammatory disorders of vagina: Secondary | ICD-10-CM | POA: Diagnosis not present

## 2022-09-05 LAB — POCT URINALYSIS DIP (MANUAL ENTRY)
Bilirubin, UA: NEGATIVE
Glucose, UA: NEGATIVE mg/dL
Ketones, POC UA: NEGATIVE mg/dL
Nitrite, UA: NEGATIVE
Protein Ur, POC: 100 mg/dL — AB
Spec Grav, UA: 1.015 (ref 1.010–1.025)
Urobilinogen, UA: 0.2 E.U./dL
pH, UA: 6 (ref 5.0–8.0)

## 2022-09-05 LAB — POCT URINE PREGNANCY: Preg Test, Ur: NEGATIVE

## 2022-09-05 MED ORDER — DOXYCYCLINE HYCLATE 100 MG PO CAPS: 100.0000 mg | ORAL_CAPSULE | Freq: Two times a day (BID) | ORAL | 0 refills | Status: DC

## 2022-09-05 MED ORDER — CEFTRIAXONE SODIUM 500 MG IJ SOLR
500.0000 mg | INTRAMUSCULAR | Status: DC
  Administered 2022-09-05: 500 mg via INTRAMUSCULAR

## 2022-09-05 MED ORDER — METRONIDAZOLE 500 MG PO TABS: 500.0000 mg | ORAL_TABLET | Freq: Two times a day (BID) | ORAL | 0 refills | Status: DC

## 2022-09-05 NOTE — Discharge Instructions (Signed)
I am very concerned about a pelvic infection.  We are starting antibiotics to treat this.  We gave an injection of ceftriaxone today.  Start doxycycline 100 mg twice daily for 2 weeks.  Stay out of the sun while on this medication.  Take metronidazole twice daily for 2 weeks.  Do not drink any alcohol while on this medication as it can cause you to vomit.  I will contact you if your lab work is abnormal.  Make sure that you are drinking plenty of fluid.  It is very important that you follow-up with either our clinic, your primary care, OB/GYN within the next 1 to 2 days.  Someone needs to evaluate you in this timeframe.  As we discussed, if anything changes or worsens and you have persistent pain, lightheadedness, nausea, vomiting, weakness, severe abdominal pain, fever you need to go to the emergency room immediately.

## 2022-09-05 NOTE — ED Provider Notes (Signed)
[facility]    CSN: [id number] Arrival date & time: 09/05/22  1653      History   Chief Complaint Chief Complaint  Patient presents with   Abdominal Pain   Vaginal Discharge   Dysuria    HPI Tasha Wu is a 32 y.o. female.   Patient presents today with a 3-day history of lower abdominal pain.  She reports pain is rated 7 on a 0-10 pain scale, described as aching, no aggravating relieving factors identified.  Over the past several days she has also developed some vaginal discharge that is malodorous as well as malodorous urine and dysuria/urinary frequency.  She does have a history of recurrent UTI including pyelonephritis and has previously been followed by urology for nephrolithiasis.  She has since been released from them and is no longer followed by specialist.  Denies previous abdominal surgery still has gallbladder and appendix.  She has been experiencing some nausea but denies any vomiting, diarrhea, melena, hematochezia.  Denies history of diabetes and does not take SGLT2 inhibitor.  Denies any recent antibiotics.  She is sexually active with female partners and has no specific concern for STI.  She is open to testing.    Past Medical History:  Diagnosis Date   Abnormal Pap smear 12/10/2011   Anemia    Anxiety 2010   anxiety attack;no meds;has not been dx'd   Emotional instability (HCC)    Break down easily when things are not going her way   GERD (gastroesophageal reflux disease)    History of pulmonary embolus during pregnancy    Hx of joint problems    Shoulders and knees have pain   Infection    Yeast;would get frequently   Infection    BV x 1   Kidney infection 2010   PO antibxs   Kidney stone complicating pregnancy    Migraines    no rx'd meds in the past;goes to sleep   PCOS (polycystic ovarian syndrome)    Sickle cell trait (HCC)    Sickle cell trait (HCC)    Umbilical hernia 1991   Does not cause anyp problems   Vaginal Pap smear,  abnormal     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   Peripheral neuropathy 09/11/2020   SVD (spontaneous vaginal delivery) 06/25/2020   Normal postpartum course 06/25/2020   Anemia affecting pregnancy in third trimester 06/25/2020   Encounter for induction of labor 06/23/2020   Anemia of pregnancy 04/14/2020   History of pulmonary embolus (PE) 01/15/2020   Pregnant 12/23/2019   Bilateral pulmonary embolism (HCC) 12/04/2019   High-risk pregnancy in first trimester    Anemia 12/21/2017   Atypical squamous cells of undetermined significance (ASC-US) on cervical Pap smear 08/19/2017   Pyelonephritis 07/22/2017   Sickle cell trait (HCC) 07/21/2017   Anxiety 10/03/2016   Gastroesophageal reflux disease 10/03/2016   Migraine with aura 10/03/2016   Polycystic ovaries 10/03/2016   Post-term pregnancy 06/18/2012   Abnormal Pap smear 12/10/2011   Umbilical hernia 11/29/2011   H/O pyelonephritis 11/29/2011    Past Surgical History:  Procedure Laterality Date   DILATION AND CURETTAGE OF UTERUS  2009   No complications   IR ANGIOGRAM SELECTIVE EACH ADDITIONAL VESSEL  12/05/2019   IR ANGIOGRAM SELECTIVE EACH ADDITIONAL VESSEL  12/05/2019   IR INFUSION THROMBOL ARTERIAL INITIAL (MS)  12/05/2019  IR THROMB F/U EVAL ART/VEN FINAL DAY (MS)  12/06/2019   IR US GUIDE VASC ACCESS LEFT  12/05/2019   IR US GUIDE VASC ACCESS LEFT  12/05/2019   WISDOM TOOTH EXTRACTION      OB History     Gravida  5   Para  4   Term  4   Preterm      AB  1   Living  4      SAB      IAB      Ectopic      Multiple  0   Live Births  4            Home Medications    Prior to Admission medications   Medication Sig Start Date End Date Taking? Authorizing Provider  doxycycline (VIBRAMYCIN) 100 MG capsule Take 1 capsule (100 mg total) by mouth 2  (two) times daily. 09/05/22  Yes Jakaylah Schlafer K, PA-C  metroNIDAZOLE (FLAGYL) 500 MG tablet Take 1 tablet (500 mg total) by mouth 2 (two) times daily. 09/05/22  Yes Yuliana Vandrunen K, PA-C  benzonatate (TESSALON) 100 MG capsule Take 1 capsule (100 mg total) by mouth 3 (three) times daily as needed for cough. 08/02/22   Waldon Merl, PA-C  fluticasone (FLONASE) 50 MCG/ACT nasal spray Place 2 sprays into both nostrils daily. 05/12/22   Junie Spencer, FNP  hydrocortisone (ANUSOL-HC) 25 MG suppository Place 1 suppository (25 mg total) rectally 2 (two) times daily. 06/13/22   Roxy Horseman, PA-C  hydrocortisone cream (PREPARATION H) 1 % Apply 1 Application topically 2 (two) times daily. 06/13/22   Roxy Horseman, PA-C    Family History Family History  Problem Relation Age of Onset   Sickle cell trait Mother    Hypertension Mother    Anemia Mother    Sickle cell trait Brother    Sickle cell anemia Maternal Aunt        Recently deceased from [facility] crisis   Diabetes Maternal Aunt    Sickle cell anemia Maternal Grandmother        Deceased   Heart disease Maternal Aunt    Asthma Sister        1/2 sibling;father's child   Hyperthyroidism Paternal Grandmother    Scoliosis Paternal Grandmother    Arthritis Paternal Grandmother    Migraines Father    Other Father        Joint Problems;shoulders and knees   Hernia Maternal Aunt        Umbilical   Hypertension Maternal Uncle    Diabetes type I Cousin        maternal   Diabetes Paternal Grandfather     Social History Social History   Tobacco Use   Smoking status: Former    Packs/day: 0.25    Years: 5.00    Additional pack years: 0.00    Total pack years: 1.25    Types: Cigarettes    Quit date: 11/18/2011    Years since quitting: 10.8    Passive exposure: Never   Smokeless tobacco: Never  Vaping Use   Vaping Use: Never used  Substance Use Topics   Alcohol use: Not Currently   Drug use: No     Allergies   Patient has no known  allergies.   Review of Systems Review of Systems  Constitutional:  Positive for activity change. Negative for appetite change, fatigue and fever.  Gastrointestinal:  Positive for abdominal pain and nausea. Negative for diarrhea and vomiting.  Genitourinary:  Positive for dysuria and vaginal discharge. Negative for flank pain, frequency, pelvic pain, urgency, vaginal bleeding and vaginal pain.  Musculoskeletal:  Negative for arthralgias, back pain and myalgias.     Physical Exam Triage Vital Signs ED Triage Vitals  Enc Vitals Group     BP 09/05/22 1725 93/63     Pulse Rate 09/05/22 1724 (!) 103     Resp 09/05/22 1724 17     Temp 09/05/22 1724 98.4 F (36.9 C)     Temp Source 09/05/22 1724 Oral     SpO2 09/05/22 1724 96 %     Weight --      Height --      Head Circumference --      Peak Flow --      Pain Score 09/05/22 1723 6     Pain Loc --      Pain Edu? --      Excl. in GC? --    No data found.  Updated Vital Signs BP 94/64 (BP Location: Left Arm)   Pulse 93   Temp 98.4 F (36.9 C) (Oral)   Resp 16   LMP 08/14/2022 (Exact Date)   SpO2 97%   Visual Acuity Right Eye Distance:   Left Eye Distance:   Bilateral Distance:    Right Eye Near:   Left Eye Near:    Bilateral Near:     Physical Exam Vitals reviewed. Exam conducted with a chaperone present.  Constitutional:      General: She is awake. She is not in acute distress.    Appearance: Normal appearance. She is well-developed. She is not ill-appearing.     Comments: Very pleasant female appears stated age in no acute distress sitting comfortably in exam room  HENT:     Head: Normocephalic and atraumatic.  Cardiovascular:     Rate and Rhythm: Normal rate and regular rhythm.     Heart sounds: Normal heart sounds, S1 normal and S2 normal. No murmur heard. Pulmonary:     Effort: Pulmonary effort is normal.     Breath sounds: Normal breath sounds. No wheezing, rhonchi or rales.     Comments: Clear to  auscultation bilaterally Abdominal:     General: Bowel sounds are normal.     Palpations: Abdomen is soft.     Tenderness: There is abdominal tenderness in the right lower quadrant, suprapubic area and left lower quadrant. There is no right CVA tenderness, left CVA tenderness, guarding or rebound. Negative signs include Rovsing's sign and McBurney's sign.     Comments: Tenderness palpation throughout lower abdomen.  No evidence of acute abdomen on physical exam.  Genitourinary:    Labia:        Right: No rash or tenderness.        Left: No rash or tenderness.      Vagina: Vaginal discharge and tenderness present.     Cervix: Cervical motion tenderness present.     Uterus: Normal. Tender.      Adnexa:        Right: Tenderness present. No mass.         Left: Tenderness present. No mass.       Comments: Frederica, RN present as chaperone during exam.  Patient had difficulty tolerating speculum exam.  Significant tenderness on bimanual exam including CMT. Psychiatric:        Behavior: Behavior is cooperative.      UC Treatments / Results  Labs (all labs ordered are listed,  but only abnormal results are displayed) Labs Reviewed  POCT URINALYSIS DIP (MANUAL ENTRY) - Abnormal; Notable for the following components:      Result Value   Clarity, UA cloudy (*)    Blood, UA moderate (*)    Protein Ur, POC =100 (*)    Leukocytes, UA Large (3+) (*)    All other components within normal limits  URINE CULTURE  CBC WITH DIFFERENTIAL/PLATELET  COMPREHENSIVE METABOLIC PANEL  HIV ANTIBODY (ROUTINE TESTING W REFLEX)  RPR  POCT URINE PREGNANCY  CERVICOVAGINAL ANCILLARY ONLY    EKG   Radiology No results found.  Procedures Procedures (including critical care time)  Medications Ordered in UC Medications  cefTRIAXone (ROCEPHIN) injection 500 mg (500 mg Intramuscular Given 09/05/22 1810)    Initial Impression / Assessment and Plan / UC Course  I have reviewed the triage vital signs  and the nursing notes.  Pertinent labs & imaging results that were available during my care of the patient were reviewed by me and considered in my medical decision making (see chart for details).     Patient is well-appearing, afebrile, nontoxic.  She was initially mildly tachycardic but this improved on retake.  Her blood pressure was soft but she denies any lightheadedness, near-syncope, weakness.  Urine did have leukocyte esterase but given associated vaginal discharge pelvic exam was also performed which showed significant cervical motion tenderness.  Concern for PID.  Will send her urine for culture but defer additional antibiotics specific for UTI/pyelonephritis until these results are available as I suspect leukocyte esterase is related to vaginitis.  We discussed that given her symptoms including soft blood pressure it is reasonable to go to the emergency room.  Patient declined this today.  STI swab was collected and is pending.  Labs including CBC and CMP were obtained and are pending.  If she has significant leukocytosis or abnormal kidney function she will need to go to the ER.  While blood was being drawn HIV and syphilis testing were obtained as well.  She was given Rocephin in clinic and started on metronidazole and doxycycline outpatient.  Discussed that she is to avoid prolonged sun exposure while on doxycycline and avoid alcohol consumption while on metronidazole due to associated side effects with these medications.  She is to rest and drink plenty of fluid.  We discussed that if she is not going to go to the emergency room she would need to be reevaluated in 1 to 2 days by either PCP or OB/GYN.  If she is not able to get into see them in this timeframe she can return here for reevaluation.  We discussed at length that if she has any changing or worsening symptoms including severe abdominal pain, pelvic pain, fever, nausea/vomiting interfering with oral intake, weakness she needs to go to  the emergency room immediately.  Strict return precautions given.  Work excuse note provided.  Final Clinical Impressions(s) / UC Diagnoses   Final diagnoses:  Lower abdominal pain  PID (acute pelvic inflammatory disease)  Vaginal discharge  Abnormal urinalysis     Discharge Instructions      I am very concerned about a pelvic infection.  We are starting antibiotics to treat this.  We gave an injection of ceftriaxone today.  Start doxycycline 100 mg twice daily for 2 weeks.  Stay out of the sun while on this medication.  Take metronidazole twice daily for 2 weeks.  Do not drink any alcohol while on this medication as it can  cause you to vomit.  I will contact you if your lab work is abnormal.  Make sure that you are drinking plenty of fluid.  It is very important that you follow-up with either our clinic, your primary care, OB/GYN within the next 1 to 2 days.  Someone needs to evaluate you in this timeframe.  As we discussed, if anything changes or worsens and you have persistent pain, lightheadedness, nausea, vomiting, weakness, severe abdominal pain, fever you need to go to the emergency room immediately.     ED Prescriptions     Medication Sig Dispense Auth. Provider   doxycycline (VIBRAMYCIN) 100 MG capsule Take 1 capsule (100 mg total) by mouth 2 (two) times daily. 28 capsule Wallie Lagrand K, PA-C   metroNIDAZOLE (FLAGYL) 500 MG tablet Take 1 tablet (500 mg total) by mouth 2 (two) times daily. 28 tablet Melvine Julin, Noberto Retort, PA-C      PDMP not reviewed this encounter.   Jeani Hawking, PA-C 09/05/22 8119

## 2022-09-05 NOTE — Progress Notes (Signed)
Because nausea, belly pain and change in sensation of lower abdomen and unknown if fever is present- you have some signs of possible kidney infection, I feel your condition warrants further evaluation and I recommend that you be seen in a face to face visit.   NOTE: There will be NO CHARGE for this eVisit   If you are having a true medical emergency please call 911.

## 2022-09-05 NOTE — ED Triage Notes (Signed)
Pt presents with c/o vaginal discharge and foul odor x 3 days. States her urine was cloudy and has burning sensation after voiding.   States she has been nauseas and abd pain. Pt states when she stands up she felt heaviness in her at her her lower abdomen.

## 2022-09-07 LAB — CERVICOVAGINAL ANCILLARY ONLY
Bacterial Vaginitis (gardnerella): POSITIVE — AB
Candida Glabrata: NEGATIVE
Candida Vaginitis: NEGATIVE
Chlamydia: NEGATIVE
Comment: NEGATIVE
Comment: NEGATIVE
Comment: NEGATIVE
Comment: NEGATIVE
Comment: NEGATIVE
Comment: NORMAL
Neisseria Gonorrhea: NEGATIVE
Trichomonas: NEGATIVE

## 2022-09-07 LAB — URINE CULTURE

## 2022-09-08 ENCOUNTER — Telehealth: Payer: Self-pay

## 2022-09-08 LAB — URINE CULTURE: Culture: >=100000 — AB

## 2022-09-08 MED ORDER — CIPROFLOXACIN HCL 500 MG PO TABS: 500.0000 mg | ORAL_TABLET | Freq: Two times a day (BID) | ORAL | 0 refills | Status: DC

## 2022-09-08 NOTE — Telephone Encounter (Signed)
Per Helaine Chess, PA-C, "she doesn't have PID, she doesn't need Doxy anymore, ok to stop now. Continue metronidazole BID to complete a full 7 days of ttmt for BV. To treat the proteus mirabilis, I recommend Cipro 500 BID x 5 days. She had a lot of belly tenderness on exam." Rx sent to pharmacy on file. Attempted to reach patient x1. LVM

## 2022-09-09 LAB — COMPREHENSIVE METABOLIC PANEL
ALT: 7 IU/L (ref 0–32)
AST: 16 IU/L (ref 0–40)
Albumin: 4.7 g/dL (ref 3.9–4.9)
Alkaline Phosphatase: 63 IU/L (ref 44–121)
BUN/Creatinine Ratio: 10 (ref 9–23)
BUN: 12 mg/dL (ref 6–20)
Bilirubin Total: 0.5 mg/dL (ref 0.0–1.2)
CO2: 20 mmol/L (ref 20–29)
Calcium: 9.8 mg/dL (ref 8.7–10.2)
Chloride: 104 mmol/L (ref 96–106)
Creatinine, Ser: 1.17 mg/dL — ABNORMAL HIGH (ref 0.57–1.00)
Globulin, Total: 2.9 g/dL (ref 1.5–4.5)
Glucose: 78 mg/dL (ref 70–99)
Potassium: 4.3 mmol/L (ref 3.5–5.2)
Sodium: 139 mmol/L (ref 134–144)
Total Protein: 7.6 g/dL (ref 6.0–8.5)
eGFR: 63 mL/min/{1.73_m2} (ref >59)

## 2022-09-09 LAB — CBC WITH DIFFERENTIAL/PLATELET
Basophils Absolute: 0.1 10*3/uL (ref 0.0–0.2)
Basos: 1 %
EOS (ABSOLUTE): 0.2 10*3/uL (ref 0.0–0.4)
Eos: 2 %
Hematocrit: 41.8 % (ref 34.0–46.6)
Hemoglobin: 12.7 g/dL (ref 11.1–15.9)
Immature Grans (Abs): 0 10*3/uL (ref 0.0–0.1)
Immature Granulocytes: 0 %
Lymphocytes Absolute: 2 10*3/uL (ref 0.7–3.1)
Lymphs: 28 %
MCH: 24.7 pg — ABNORMAL LOW (ref 26.6–33.0)
MCHC: 30.4 g/dL — ABNORMAL LOW (ref 31.5–35.7)
MCV: 81 fL (ref 79–97)
Monocytes Absolute: 0.4 10*3/uL (ref 0.1–0.9)
Monocytes: 5 %
Neutrophils Absolute: 4.7 10*3/uL (ref 1.4–7.0)
Neutrophils: 64 %
Platelets: 305 10*3/uL (ref 150–450)
RBC: 5.15 x10E6/uL (ref 3.77–5.28)
RDW: 13.8 % (ref 11.7–15.4)
WBC: 7.4 10*3/uL (ref 3.4–10.8)

## 2022-09-09 LAB — RPR: RPR Ser Ql: NONREACTIVE

## 2022-09-09 LAB — HIV ANTIBODY (ROUTINE TESTING W REFLEX): HIV Screen 4th Generation wRfx: NONREACTIVE

## 2022-09-20 ENCOUNTER — Telehealth: Payer: 59 | Admitting: Physician Assistant

## 2022-09-20 DIAGNOSIS — B379 Candidiasis, unspecified: Secondary | ICD-10-CM | POA: Diagnosis not present

## 2022-09-20 DIAGNOSIS — T3695XA Adverse effect of unspecified systemic antibiotic, initial encounter: Secondary | ICD-10-CM

## 2022-09-20 MED ORDER — FLUCONAZOLE 150 MG PO TABS
ORAL_TABLET | ORAL | 0 refills | Status: DC
Start: 2022-09-20 — End: 2024-01-09

## 2022-09-20 NOTE — Progress Notes (Signed)

## 2022-09-20 NOTE — Progress Notes (Signed)
I have spent 5 minutes in review of e-visit questionnaire, review and updating patient chart, medical decision making and response to patient.   William Cody Martin, PA-C    

## 2022-12-03 ENCOUNTER — Telehealth: Payer: 59 | Admitting: Emergency Medicine

## 2022-12-03 DIAGNOSIS — R102 Pelvic and perineal pain: Secondary | ICD-10-CM

## 2022-12-03 NOTE — Progress Notes (Signed)
Because of the recurrent nature of your symptoms and history of PID, I feel your condition warrants further evaluation and I recommend that you be seen in a face to face visit.   NOTE: There will be NO CHARGE for this eVisit   If you are having a true medical emergency please call 911.      For an urgent face to face visit, [facility] has eight urgent care centers for your convenience:   NEW!! [facility] Health Urgent Care Center at [facility] Get Driving Directions [phone] [address], [address] [place], [address]    [facility] Center at [facility] Get Driving Directions [phone] [address] [place], [place] [address]   [facility] Ur[facility] Center [facility]) Get Driving Directions [phone] [address] [place], [place] [address]  [facility] Health Urgent Care Center [facility]) Get Driving Directions [phone] [address] [place],  [place]  [address]  [facility] Health Urgent Care Center [facility] - at [employer]  [phone] [address] [place],  [place] [address]   [facility] Health Urgent Care at [facility] Get Driving Directions [phone] [address], [address] [place], [place] [address]   [facility] Health Urgent Care at [facility] Get Driving Directions  [phone] [address] [place], [place] [address]   [facility] Health Urgent Care at [facility] Directions [phone] [address] [place], [place] [address]  Your MyChart E-visit questionnaire answers were reviewed by a board certified advanced clinical practitioner to complete your personal care plan based on your specific symptoms.  Thank you for using e-Visits.   Approximately 5 minutes was used in reviewing the patient's chart, questionnaire, prescribing medications, and documentation.

## 2023-06-07 ENCOUNTER — Telehealth: Admitting: Physician Assistant

## 2023-06-07 DIAGNOSIS — B9689 Other specified bacterial agents as the cause of diseases classified elsewhere: Secondary | ICD-10-CM | POA: Diagnosis not present

## 2023-06-07 DIAGNOSIS — J019 Acute sinusitis, unspecified: Secondary | ICD-10-CM

## 2023-06-07 MED ORDER — AMOXICILLIN-POT CLAVULANATE 875-125 MG PO TABS
1.0000 | ORAL_TABLET | Freq: Two times a day (BID) | ORAL | 0 refills | Status: DC
Start: 2023-06-07 — End: 2024-01-09

## 2023-06-07 NOTE — Progress Notes (Signed)

## 2023-07-19 ENCOUNTER — Ambulatory Visit: Admitting: Family

## 2023-07-19 ENCOUNTER — Encounter: Payer: Self-pay | Admitting: Family

## 2023-07-19 VITALS — BP 114/73 | HR 81 | Temp 98.3°F | Resp 16 | Ht 67.0 in | Wt 138.0 lb

## 2023-07-19 DIAGNOSIS — Z111 Encounter for screening for respiratory tuberculosis: Secondary | ICD-10-CM | POA: Diagnosis not present

## 2023-07-19 DIAGNOSIS — Z1329 Encounter for screening for other suspected endocrine disorder: Secondary | ICD-10-CM

## 2023-07-19 DIAGNOSIS — Z130 Encounter for screening for diseases of the blood and blood-forming organs and certain disorders involving the immune mechanism: Secondary | ICD-10-CM | POA: Diagnosis not present

## 2023-07-19 DIAGNOSIS — Z13228 Encounter for screening for other metabolic disorders: Secondary | ICD-10-CM | POA: Diagnosis not present

## 2023-07-19 DIAGNOSIS — R229 Localized swelling, mass and lump, unspecified: Secondary | ICD-10-CM

## 2023-07-19 DIAGNOSIS — Z131 Encounter for screening for diabetes mellitus: Secondary | ICD-10-CM

## 2023-07-19 DIAGNOSIS — Z124 Encounter for screening for malignant neoplasm of cervix: Secondary | ICD-10-CM

## 2023-07-19 DIAGNOSIS — Z13220 Encounter for screening for lipoid disorders: Secondary | ICD-10-CM

## 2023-07-19 DIAGNOSIS — Z0000 Encounter for general adult medical examination without abnormal findings: Secondary | ICD-10-CM

## 2023-07-19 NOTE — Progress Notes (Signed)
 Tb test ,  lump in scalp/ referral

## 2023-07-19 NOTE — Progress Notes (Signed)
 Patient ID: Tasha Wu, female    DOB: December 27, 1990  MRN: [id number]  CC: Annual Exam  Subjective: Tasha Wu is a 33 y.o. female who presents for annual exam.  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 - Referral to Gynecology for women's health maintenance and screenings. - Reports knot in scalp (right side). Reports discomfort and increasing in size. Denies red flag symptoms.  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   Peripheral neuropathy 09/11/2020   SVD (spontaneous vaginal delivery) 06/25/2020   Normal postpartum course 06/25/2020   Anemia affecting pregnancy in third trimester 06/25/2020   Encounter for induction of labor 06/23/2020   Anemia of pregnancy 04/14/2020   History of pulmonary embolus (PE) 01/15/2020   Pregnant 12/23/2019   Bilateral pulmonary embolism (HCC) 12/04/2019   High-risk pregnancy in first trimester    Anemia 12/21/2017   Atypical squamous cells of undetermined significance (ASC-US ) on cervical Pap smear 08/19/2017   Pyelonephritis 07/22/2017   Sickle cell trait (HCC) 07/21/2017   Anxiety 10/03/2016   Gastroesophageal reflux disease 10/03/2016   Migraine with aura 10/03/2016   Polycystic ovaries 10/03/2016   Post-term pregnancy 06/18/2012   Abnormal Pap smear 12/10/2011   Umbilical hernia 11/29/2011   H/O pyelonephritis 11/29/2011     Current Outpatient Medications on File Prior to Visit  Medication Sig Dispense Refill   amoxicillin -clavulanate (AUGMENTIN ) 875-125 MG tablet Take 1 tablet by mouth 2 (two) times daily. (Patient not taking: Reported on 07/19/2023) 14 tablet 0   benzonatate  (TESSALON ) 100 MG capsule Take 1 capsule (100 mg total) by mouth 3 (three) times daily as needed for  cough. (Patient not taking: Reported on 07/19/2023) 30 capsule 0   fluconazole  (DIFLUCAN ) 150 MG tablet Take 1 tablet PO once. Repeat in 3 days if needed. (Patient not taking: Reported on 07/19/2023) 2 tablet 0   fluticasone  (FLONASE ) 50 MCG/ACT nasal spray Place 2 sprays into both nostrils daily. (Patient not taking: Reported on 07/19/2023) 16 g 6   hydrocortisone  (ANUSOL -HC) 25 MG suppository Place 1 suppository (25 mg total) rectally 2 (two) times daily. (Patient not taking: Reported on 07/19/2023) 12 suppository 0   hydrocortisone  cream (PREPARATION H) 1 % Apply 1 Application topically 2 (two) times daily. (Patient not taking: Reported on 07/19/2023) 30 g 0   No current facility-administered medications on file prior to visit.    No Known Allergies  Social History   Socioeconomic History   Marital status: Significant Other    Spouse name: Not on file   Number of children: 2   Years of education: 15   Highest education level: Some college, no degree  Occupational History   Not on file  Tobacco Use   Smoking status: Former    Current packs/day: 0.00    Average packs/day: 0.3 packs/day for 5.0 years (1.3 ttl pk-yrs)    Types: Cigarettes    Start date: 11/18/2006    Quit date: 11/18/2011    Years since quitting: 11.6    Passive exposure: Never   Smokeless tobacco: Never  Vaping Use   Vaping status: Never Used  Substance and Sexual Activity   Alcohol use: Not Currently   Drug use: No   Sexual activity: Not on file  Other  Topics Concern   Not on file  Social History Narrative   Not on file   Social Drivers of Health   Financial Resource Strain: Patient Declined (07/18/2023)   Overall Financial Resource Strain (CARDIA)    Difficulty of Paying Living Expenses: Patient declined  Food Insecurity: Patient Declined (07/18/2023)   Hunger Vital Sign    Worried About Running Out of Food in the Last Year: Patient declined    Ran Out of Food in the Last Year: Patient declined   Transportation Needs: No Transportation Needs (07/18/2023)   PRAPARE - Administrator, Civil Service (Medical): No    Lack of Transportation (Non-Medical): No  Physical Activity: Unknown (07/18/2023)   Exercise Vital Sign    Days of Exercise per Week: 0 days    Minutes of Exercise per Session: Not on file  Stress: Stress Concern Present (07/18/2023)   [employer] of Occupational Health - Occupational Stress Questionnaire    Feeling of Stress : Very much  Social Connections: Unknown (07/18/2023)   Social Connection and Isolation Panel [NHANES]    Frequency of Communication with Friends and Family: Once a week    Frequency of Social Gatherings with Friends and Family: Never    Attends Religious Services: Never    Database administrator or Organizations: No    Attends Engineer, structural: Not on file    Marital Status: Patient declined  Intimate Partner Violence: Not At Risk (07/19/2023)   Humiliation, Afraid, Rape, and Kick questionnaire    Fear of Current or Ex-Partner: No    Emotionally Abused: No    Physically Abused: No    Sexually Abused: No    Family History  Problem Relation Age of Onset   Sickle cell trait Mother    Hypertension Mother    Anemia Mother    Sickle cell trait Brother    Sickle cell anemia Maternal Aunt        Recently deceased from F[employer] crisis   Diabetes Maternal Aunt    Sickle cell anemia Maternal Grandmother        Deceased   Heart disease Maternal Aunt    Asthma Sister        1/2 sibling;father's child   Hyperthyroidism Paternal Grandmother    Scoliosis Paternal Grandmother    Arthritis Paternal Grandmother    Migraines Father    Other Father        Joint Problems;shoulders and knees   Hernia Maternal Aunt        Umbilical   Hypertension Maternal Uncle    Diabetes type I Cousin        maternal   Diabetes Paternal Grandfather     Past Surgical History:  Procedure Laterality Date   DILATION AND CURETTAGE OF UTERUS   2009   No complications   IR ANGIOGRAM SELECTIVE EACH ADDITIONAL VESSEL  12/05/2019   IR ANGIOGRAM SELECTIVE EACH ADDITIONAL VESSEL  12/05/2019   IR INFUSION THROMBOL ARTERIAL INITIAL (MS)  12/05/2019   IR THROMB F/U EVAL ART/VEN FINAL DAY (MS)  12/06/2019   IR US  GUIDE VASC ACCESS LEFT  12/05/2019   IR US  GUIDE VASC ACCESS LEFT  12/05/2019   WISDOM TOOTH EXTRACTION      ROS: Review of Systems Negative except as stated above  PHYSICAL EXAM: BP 114/73   Pulse 81   Temp 98.3 F (36.8 C) (Oral)   Resp 16   Ht 5\' 7"  (1.702 m)   Wt 138 lb (62.6  kg)   LMP 06/24/2023 (Exact Date)   SpO2 99%   BMI 21.61 kg/m   Physical Exam HENT:     Head: Normocephalic and atraumatic.     Comments: Firm movable knot located underneath scalp (right side).    Right Ear: Tympanic membrane, ear canal and external ear normal.     Left Ear: Tympanic membrane, ear canal and external ear normal.     Nose: Nose normal.     Mouth/Throat:     Mouth: Mucous membranes are moist.     Pharynx: Oropharynx is clear.  Eyes:     Extraocular Movements: Extraocular movements intact.     Conjunctiva/sclera: Conjunctivae normal.     Pupils: Pupils are equal, round, and reactive to light.  Neck:     Thyroid: No thyroid mass, thyromegaly or thyroid tenderness.  Cardiovascular:     Rate and Rhythm: Normal rate and regular rhythm.     Pulses: Normal pulses.     Heart sounds: Normal heart sounds.  Pulmonary:     Effort: Pulmonary effort is normal.     Breath sounds: Normal breath sounds.  Chest:     Comments: Patient declined. Abdominal:     General: Bowel sounds are normal.     Palpations: Abdomen is soft.  Genitourinary:    Comments: Patient declined. Musculoskeletal:        General: Normal range of motion.     Right shoulder: Normal.     Left shoulder: Normal.     Right upper arm: Normal.     Left upper arm: Normal.     Right elbow: Normal.     Left elbow: Normal.     Right forearm: Normal.     Left  forearm: Normal.     Right wrist: Normal.     Left wrist: Normal.     Right hand: Normal.     Left hand: Normal.     Cervical back: Normal, normal range of motion and neck supple.     Thoracic back: Normal.     Lumbar back: Normal.     Right hip: Normal.     Left hip: Normal.     Right upper leg: Normal.     Left upper leg: Normal.     Right knee: Normal.     Left knee: Normal.     Right lower leg: Normal.     Left lower leg: Normal.     Right ankle: Normal.     Left ankle: Normal.     Right foot: Normal.     Left foot: Normal.  Skin:    General: Skin is warm and dry.     Capillary Refill: Capillary refill takes less than 2 seconds.  Neurological:     General: No focal deficit present.     Mental Status: She is alert and oriented to person, place, and time.  Psychiatric:        Mood and Affect: Mood normal.        Behavior: Behavior normal.     ASSESSMENT AND PLAN: 1. Annual physical exam (Primary) - Counseled on 150 minutes of exercise per week as tolerated, healthy eating (including decreased daily intake of saturated fats, cholesterol, added sugars, sodium), STI prevention, and routine healthcare maintenance.  2. Screening for metabolic disorder - Routine screening.  - CMP14+EGFR  3. Screening for deficiency anemia - Routine screening.  - CBC  4. Diabetes mellitus screening - Routine screening.  - Hemoglobin A1c  5. Screening  cholesterol level - Routine screening.  - Lipid panel  6. Thyroid disorder screen - Routine screening.  - TSH  7. Pap smear for cervical cancer screening - Referral to Gynecology for evaluation/management. - Ambulatory referral to Gynecology  8. Lump of skin - Referral to Dermatology for evaluation/management. - Ambulatory referral to Dermatology  9. Visit for TB skin test - TB skin test today in office. Return in 48 to 72 hours for CMA visit. - PPD   Patient was given the opportunity to ask questions.  Patient verbalized  understanding of the plan and was able to repeat key elements of the plan. Patient was given clear instructions to go to Emergency Department or return to medical center if symptoms don't improve, worsen, or new problems develop.The patient verbalized understanding.   Orders Placed This Encounter  Procedures   CBC   Lipid panel   CMP14+EGFR   Hemoglobin A1c   TSH   Ambulatory referral to Gynecology   Ambulatory referral to Dermatology   PPD     Return in 1 year (on 07/18/2024) for Follow-Up or next available with Abraham Abo, MD, Physical per patient preference.  Senaida Dama, NP

## 2023-07-20 LAB — CMP14+EGFR
ALT: 6 IU/L (ref 0–32)
AST: 13 IU/L (ref 0–40)
Albumin: 4.3 g/dL (ref 3.9–4.9)
Alkaline Phosphatase: 54 IU/L (ref 44–121)
BUN/Creatinine Ratio: 12 (ref 9–23)
BUN: 12 mg/dL (ref 6–20)
Bilirubin Total: 0.3 mg/dL (ref 0.0–1.2)
CO2: 19 mmol/L — ABNORMAL LOW (ref 20–29)
Calcium: 8.9 mg/dL (ref 8.7–10.2)
Chloride: 106 mmol/L (ref 96–106)
Creatinine, Ser: 1.04 mg/dL — ABNORMAL HIGH (ref 0.57–1.00)
Globulin, Total: 2.6 g/dL (ref 1.5–4.5)
Glucose: 88 mg/dL (ref 70–99)
Potassium: 3.9 mmol/L (ref 3.5–5.2)
Sodium: 138 mmol/L (ref 134–144)
Total Protein: 6.9 g/dL (ref 6.0–8.5)
eGFR: 72 mL/min/{1.73_m2} (ref >59)

## 2023-07-20 LAB — CBC
Hematocrit: 37.2 % (ref 34.0–46.6)
Hemoglobin: 11.9 g/dL (ref 11.1–15.9)
MCH: 26.9 pg (ref 26.6–33.0)
MCHC: 32 g/dL (ref 31.5–35.7)
MCV: 84 fL (ref 79–97)
Platelets: 250 10*3/uL (ref 150–450)
RBC: 4.43 x10E6/uL (ref 3.77–5.28)
RDW: 12.8 % (ref 11.7–15.4)
WBC: 5.8 10*3/uL (ref 3.4–10.8)

## 2023-07-20 LAB — TSH: TSH: 2.34 u[IU]/mL (ref 0.450–4.500)

## 2023-07-20 LAB — LIPID PANEL
Chol/HDL Ratio: 2.3 ratio (ref 0.0–4.4)
Cholesterol, Total: 134 mg/dL (ref 100–199)
HDL: 58 mg/dL (ref >39)
LDL Chol Calc (NIH): 63 mg/dL (ref 0–99)
Triglycerides: 64 mg/dL (ref 0–149)
VLDL Cholesterol Cal: 13 mg/dL (ref 5–40)

## 2023-07-20 LAB — HEMOGLOBIN A1C
Est. average glucose Bld gHb Est-mCnc: 103 mg/dL
Hgb A1c MFr Bld: 5.2 % (ref 4.8–5.6)

## 2023-07-22 ENCOUNTER — Telehealth: Payer: Self-pay | Admitting: *Deleted

## 2023-07-22 ENCOUNTER — Ambulatory Visit: Payer: Self-pay | Admitting: Family

## 2023-07-22 ENCOUNTER — Ambulatory Visit

## 2023-07-22 LAB — TB SKIN TEST
Induration: 0 mm
TB Skin Test: NEGATIVE

## 2023-07-22 NOTE — Progress Notes (Signed)
 Dr. Elvan Hamel, From my review creatinine mildly elevated and CO2 mildly low. Please advise. Thank you.  Patient's message listed below:  Hello Tasha Wu,  Can we review the CO2  and creatinine levels? I have major concerns. The last time my CO2 levels were that low, I was hospitalized due to a blood clot & pulmonary embolism. This is not something I want to ignore.

## 2023-07-22 NOTE — Progress Notes (Signed)
 Noted

## 2023-07-22 NOTE — Telephone Encounter (Signed)
 error

## 2023-07-22 NOTE — Telephone Encounter (Signed)
 Patient came in for health exam certificate form.  Provider has papers at her desk. Awaiting signature

## 2023-07-23 ENCOUNTER — Encounter: Payer: Self-pay | Admitting: Family

## 2023-07-23 NOTE — Telephone Encounter (Signed)
 Dr. Elvan Hamel, From my review creatinine mildly elevated and CO2 mildly low. Please advise. Thank you.  Patient's message listed below:  Hello Tasha Wu,  Can we review the CO2  and creatinine levels? I have major concerns. The last time my CO2 levels were that low, I was hospitalized due to a blood clot & pulmonary embolism. This is not something I want to ignore.

## 2023-07-30 ENCOUNTER — Other Ambulatory Visit: Payer: Self-pay | Admitting: Family

## 2023-07-30 DIAGNOSIS — R7989 Other specified abnormal findings of blood chemistry: Secondary | ICD-10-CM

## 2023-07-30 DIAGNOSIS — R7981 Abnormal blood-gas level: Secondary | ICD-10-CM

## 2023-07-30 NOTE — Telephone Encounter (Signed)
 Complete

## 2023-08-01 ENCOUNTER — Telehealth: Payer: Self-pay

## 2023-08-01 NOTE — Telephone Encounter (Signed)
 Copied from CRM [id number]. Topic: General - Other >> Aug 01, 2023 10:07 AM Sophia H wrote: Reason for CRM: Spoke with Corbin Dess from [facility] and was advised that the patient was referred  over for elevated CO2 levels, per provider that  is not a hematology diagnosis, the patient cannot be treated for that at this clinic. Please update referral/advise.

## 2023-08-02 ENCOUNTER — Telehealth: Payer: Self-pay | Admitting: Family

## 2023-08-02 ENCOUNTER — Telehealth: Payer: Self-pay

## 2023-08-02 NOTE — Telephone Encounter (Signed)
 I called patient to tell her recommendation by pcp no one answered so I left a voicemail to return my call.

## 2023-08-02 NOTE — Telephone Encounter (Signed)
 Copied from CRM [id number]. Topic: Clinical - Medical Advice >> Aug 02, 2023 12:49 PM Tasha Wu wrote: Reason for CRM: The patient has returned a missed call from the practice. Please contact again when possible

## 2023-08-02 NOTE — Telephone Encounter (Signed)
Dr. Wilson please advise

## 2023-08-07 NOTE — Telephone Encounter (Signed)
 Patient was contacted and given phone numbers to the specialist she had been referred to :  Nephrology and Gynecology.  She asked about Pulmonary d/t PMH of PE. Advised to call [facility] for an appointment since she has seen them in the past.   Advised to call office for additional questions or concerns. Patient verbalized understanding.

## 2023-08-07 NOTE — Telephone Encounter (Signed)
 See previous encounter

## 2023-08-08 ENCOUNTER — Telehealth: Admitting: Family Medicine

## 2023-08-08 DIAGNOSIS — J029 Acute pharyngitis, unspecified: Secondary | ICD-10-CM | POA: Diagnosis not present

## 2023-08-09 MED ORDER — AMOXICILLIN 875 MG PO TABS: 875.0000 mg | ORAL_TABLET | Freq: Two times a day (BID) | ORAL | 0 refills | Status: AC

## 2023-08-09 NOTE — Progress Notes (Signed)
 E-Visit for Sore Throat - Strep Symptoms  We are sorry that you are not feeling well.  Here is how we plan to help!  Based on what you have shared with me it is likely that you have strep pharyngitis.  Strep pharyngitis is inflammation and infection in the back of the throat.  This is an infection cause by bacteria and is treated with antibiotics.  I have prescribed Amoxicillin 500 mg twice a day for 10 days. For throat pain, we recommend over the counter oral pain relief medications such as acetaminophen or aspirin, or anti-inflammatory medications such as ibuprofen or naproxen sodium. Topical treatments such as oral throat lozenges or sprays may be used as needed. Strep infections are not as easily transmitted as other respiratory infections, however we still recommend that you avoid close contact with loved ones, especially the very young and elderly.  Remember to wash your hands thoroughly throughout the day as this is the number one way to prevent the spread of infection and wipe down door knobs and counters with disinfectant.   Home Care: Only take medications as instructed by your medical team. Complete the entire course of an antibiotic. Do not take these medications with alcohol. A steam or ultrasonic humidifier can help congestion.  You can place a towel over your head and breathe in the steam from hot water coming from a faucet. Avoid close contacts especially the very young and the elderly. Cover your mouth when you cough or sneeze. Always remember to wash your hands.  Get Help Right Away If: You develop worsening fever or sinus pain. You develop a severe head ache or visual changes. Your symptoms persist after you have completed your treatment plan.  Make sure you Understand these instructions. Will watch your condition. Will get help right away if you are not doing well or get worse.   Thank you for choosing an e-visit.  Your e-visit answers were reviewed by a board  certified advanced clinical practitioner to complete your personal care plan. Depending upon the condition, your plan could have included both over the counter or prescription medications.  Please review your pharmacy choice. Make sure the pharmacy is open so you can pick up prescription now. If there is a problem, you may contact your provider through [employer] and have the prescription routed to another pharmacy.  Your safety is important to [place]. If you have drug allergies check your prescription carefully.   For the next 24 hours you can use MyChart to ask questions about today's visit, request a non-urgent call back, or ask for a work or school excuse. You will get an email in the next two days asking about your experience. I hope that your e-visit has been valuable and will speed your recovery.    have provided 5 minutes of non face to face time during this encounter for chart review and documentation.

## 2023-09-24 ENCOUNTER — Other Ambulatory Visit: Payer: Self-pay

## 2023-09-24 DIAGNOSIS — N281 Cyst of kidney, acquired: Secondary | ICD-10-CM

## 2023-09-24 DIAGNOSIS — N12 Tubulo-interstitial nephritis, not specified as acute or chronic: Secondary | ICD-10-CM

## 2023-09-26 ENCOUNTER — Ambulatory Visit
Admission: RE | Admit: 2023-09-26 | Discharge: 2023-09-26 | Disposition: A | Discharge status: Home / Self Care | Source: Ambulatory Visit

## 2023-09-26 DIAGNOSIS — N12 Tubulo-interstitial nephritis, not specified as acute or chronic: Secondary | ICD-10-CM

## 2023-09-26 DIAGNOSIS — N281 Cyst of kidney, acquired: Secondary | ICD-10-CM

## 2023-10-03 ENCOUNTER — Telehealth: Admitting: Physician Assistant

## 2023-10-03 DIAGNOSIS — R200 Anesthesia of skin: Secondary | ICD-10-CM

## 2023-10-03 NOTE — Progress Notes (Signed)
  Because of numbness along with the continue back pain, and need for exam, I feel your condition warrants further evaluation and I recommend that you be seen in a face-to-face visit.   NOTE: There will be NO CHARGE for this E-Visit   If you are having a true medical emergency, please call 911.     For an urgent face to face visit, [facility] has multiple urgent care centers for your convenience.  Click the link below for the full list of locations and hours, walk-in wait times, appointment scheduling options and driving directions:  Urgent Care - [place], [place], [place], [place], [place], [place]       Your MyChart E-visit questionnaire answers were reviewed by a board certified advanced clinical practitioner to complete your personal care plan based on your specific symptoms.    Thank you for using e-Visits.

## 2023-10-09 ENCOUNTER — Ambulatory Visit: Admitting: Obstetrics and Gynecology

## 2023-10-09 ENCOUNTER — Other Ambulatory Visit (HOSPITAL_COMMUNITY)
Admission: RE | Admit: 2023-10-09 | Discharge: 2023-10-09 | Disposition: A | Discharge status: Home / Self Care | Source: Ambulatory Visit | Attending: Obstetrics and Gynecology | Admitting: Obstetrics and Gynecology

## 2023-10-09 ENCOUNTER — Encounter: Payer: Self-pay | Admitting: Obstetrics and Gynecology

## 2023-10-09 VITALS — BP 109/75 | HR 66 | Ht 67.0 in | Wt 137.6 lb

## 2023-10-09 DIAGNOSIS — Z01419 Encounter for gynecological examination (general) (routine) without abnormal findings: Secondary | ICD-10-CM

## 2023-10-09 DIAGNOSIS — Z124 Encounter for screening for malignant neoplasm of cervix: Secondary | ICD-10-CM | POA: Insufficient documentation

## 2023-10-09 DIAGNOSIS — N898 Other specified noninflammatory disorders of vagina: Secondary | ICD-10-CM | POA: Diagnosis not present

## 2023-10-09 DIAGNOSIS — Z113 Encounter for screening for infections with a predominantly sexual mode of transmission: Secondary | ICD-10-CM | POA: Insufficient documentation

## 2023-10-09 DIAGNOSIS — F439 Reaction to severe stress, unspecified: Secondary | ICD-10-CM | POA: Diagnosis not present

## 2023-10-09 NOTE — Progress Notes (Signed)
 Pt scored 16 on PHQ and 16 on GAD. Referral made and pt made aware and receptive to counselor. Pt  states major changes in her life recently that are affecting her mental health.    Losing weight due to eating habits recently from stress.   Pt experiencing vaginal dryness. Would like to discuss options with provider.

## 2023-10-09 NOTE — Progress Notes (Signed)
 Subjective:     Tasha Wu is a 33 y.o. female here at [facility] for a routine exam.  Current complaints: Situational stress.  Personal and family health history reviewed: yes.  Do you have a primary care provider? yes Do you feel safe at home? yes  Flowsheet Row Office Visit from 10/09/2023 in [facility] for [facility] Total Score 5    Health Maintenance Due  Topic Date Due   Hepatitis B Vaccines (1 of 3 - 19+ 3-dose series) Never done   HPV VACCINES (1 - 3-dose SCDM series) Never done   Cervical Cancer Screening (HPV/Pap Cotest)  06/25/2019   COVID-19 Vaccine (1 - 2024-25 season) Never done   INFLUENZA VACCINE  10/04/2023     Risk factors for chronic health problems: Smoking: Alchohol/how much: Pt BMI: Body mass index is 21.55 kg/m.   Gynecologic History Patient's last menstrual period was 09/22/2023 (exact date). Contraception: none Last Pap: 2023. Results were: normal Last mammogram: N/A. Results were: N/A  Obstetric History OB History  Gravida Para Term Preterm AB Living  7 4 4  3 4   SAB IAB Ectopic Multiple Live Births   2  0 4    # Outcome Date GA Lbr Len/2nd Weight Sex Type Anes PTL Lv  7 Term 06/23/20 [redacted]w[redacted]d 01:04 / 00:07 3120 g M Vag-Spont EPI  LIV     Birth Comments: wnl  6 Term 02/21/18 [redacted]w[redacted]d 29:29 3430 g M Vag-Spont EPI  LIV  5 Term 12/28/13 [redacted]w[redacted]d 04:50 / 00:14 3800 g F Vag-Spont EPI  LIV  4 Term 06/18/12 [redacted]w[redacted]d 07:42 / 00:52 3005 g F Vag-Spont EPI  LIV  3 AB 2009 [redacted]w[redacted]d            Birth Comments: No complications  2 IAB           1 IAB              The following portions of the patient's history were reviewed and updated as appropriate: allergies, current medications, past family history, past medical history, past social history, past surgical history, and problem list.  Review of Systems Pertinent items noted in HPI and remainder of comprehensive ROS otherwise negative.    Objective:   Today's Vitals    10/09/23 1528  BP: 109/75  Pulse: 66  Weight: 62.4 kg  Height: 5' 7 (1.702 m)   Body mass index is 21.55 kg/m.  VS reviewed, nursing note reviewed,  Constitutional: well developed, well nourished, no distress HEENT: normocephalic, thyroid without enlargement or mass HEART: RRR, no murmurs rubs/gallops RESP: clear and equal to auscultation bilaterally in all lobes  Breast Exam: exam performed: right breast normal without mass, skin or nipple changes or axillary nodes, left breast normal without mass, skin or nipple changes or axillary nodes Abdomen: soft Neuro: alert and oriented x 3 Skin: warm, dry Psych: affect normal Pelvic exam: Performed: Cervix pink, visually closed, without lesion, scant white creamy discharge, vaginal walls and external genitalia normal Bimanual exam: Cervix 0/long/high, firm, anterior, POS CMT, uterus nontender, nonenlarged, adnexa without tenderness, enlargement, or mass        Assessment/Plan:  1. Well woman exam with routine gynecological exam (Primary) - Cytology - PAP( [facility]) Follow up 1 year for AEX and ROI [facility] Records   2. Cervical cancer screening - Cytology - PAP( ) Hx of abnormal pap in 2019. Last pap in 2023 was normal. ROI [facility] records  3.  Situational stress - Amb ref to Integrated Behavioral Health  4. Screening examination for STD (sexually transmitted disease) - Cervicovaginal ancillary only( [place]) - RPR - Hepatitis B Surface AntiGEN - Hepatitis C Antibody - HIV antibody (with reflex)   5. Vaginal dryness Possibly related to mood. Discussed natural remedies such as coconut oil, Replens, and possibly hormone therapy if natural remedies are ineffective.    Return in about 1 year (around 10/08/2024) for AEX.   Derrek JINNY Freund, NP Student 4:54 PM

## 2023-10-10 ENCOUNTER — Ambulatory Visit: Payer: Self-pay | Admitting: Obstetrics and Gynecology

## 2023-10-10 ENCOUNTER — Telehealth: Admitting: Physician Assistant

## 2023-10-10 DIAGNOSIS — T753XXA Motion sickness, initial encounter: Secondary | ICD-10-CM

## 2023-10-10 DIAGNOSIS — B9689 Other specified bacterial agents as the cause of diseases classified elsewhere: Secondary | ICD-10-CM

## 2023-10-10 LAB — CERVICOVAGINAL ANCILLARY ONLY
Bacterial Vaginitis (gardnerella): POSITIVE — AB
Candida Glabrata: NEGATIVE
Candida Vaginitis: NEGATIVE
Chlamydia: NEGATIVE
Comment: NEGATIVE
Comment: NEGATIVE
Comment: NEGATIVE
Comment: NEGATIVE
Comment: NEGATIVE
Comment: NORMAL
Neisseria Gonorrhea: NEGATIVE
Trichomonas: NEGATIVE

## 2023-10-10 LAB — RPR: RPR Ser Ql: NONREACTIVE

## 2023-10-10 LAB — HEPATITIS B SURFACE ANTIGEN: Hepatitis B Surface Ag: NEGATIVE

## 2023-10-10 LAB — HEPATITIS C ANTIBODY: Hep C Virus Ab: NONREACTIVE

## 2023-10-10 LAB — HIV ANTIBODY (ROUTINE TESTING W REFLEX): HIV Screen 4th Generation wRfx: NONREACTIVE

## 2023-10-10 NOTE — Progress Notes (Signed)

## 2023-10-13 MED ORDER — METRONIDAZOLE 500 MG PO TABS
500.0000 mg | ORAL_TABLET | Freq: Two times a day (BID) | ORAL | 0 refills | Status: AC
Start: 2023-10-13 — End: 2023-10-20

## 2023-10-14 LAB — CYTOLOGY - PAP
Comment: NEGATIVE
Diagnosis: NEGATIVE
High risk HPV: NEGATIVE

## 2024-01-09 ENCOUNTER — Telehealth: Admitting: Physician Assistant

## 2024-01-09 DIAGNOSIS — K047 Periapical abscess without sinus: Secondary | ICD-10-CM | POA: Diagnosis not present

## 2024-01-09 MED ORDER — AMOXICILLIN-POT CLAVULANATE 875-125 MG PO TABS: 1.0000 | ORAL_TABLET | Freq: Two times a day (BID) | ORAL | 0 refills | Status: DC

## 2024-01-09 NOTE — Progress Notes (Signed)

## 2024-02-03 ENCOUNTER — Telehealth: Admitting: Emergency Medicine

## 2024-02-03 DIAGNOSIS — L700 Acne vulgaris: Secondary | ICD-10-CM | POA: Diagnosis not present

## 2024-02-03 MED ORDER — DOXYCYCLINE HYCLATE 100 MG PO TABS: 100.0000 mg | ORAL_TABLET | Freq: Two times a day (BID) | ORAL | 0 refills | Status: AC

## 2024-02-03 MED ORDER — CLINDAMYCIN PHOS-BENZOYL PEROX 1-5 % EX GEL: Freq: Two times a day (BID) | CUTANEOUS | 0 refills | Status: AC

## 2024-02-03 NOTE — Progress Notes (Signed)
 We are sorry that you are experiencing this issue.  Here is how we plan to help!  Based on what you shared with me it looks like you have cystic acne.  Acne is a disorder of the hair follicles and oil glands (sebaceous glands). The sebaceous glands secrete oils to keep the skin moist.  When the glands get clogged, it can lead to pimples or cysts.  These cysts may become infected and leave scars. Acne is very common and normally occurs at puberty.  Acne is also inherited.  Your personal care plan consists of the following recommendations:  I recommend that you use a daily cleanser  You might try an over the counter cleanser that has benzoyl peroxide.  I recommend that you start with a product that has 2.5% benzoyl peroxide.  Stronger concentrations have not been shown to be more effective.  I have prescribed a topical gel with an antibiotic:  Clindamycin -benzoyl peroxide gel.  This gel should be applied to the affected areas twice a day.  Be sure to read the package insert to understand potential side effects.  I have also prescribed one of the following additional therapies:  Doxycycline  an oral antibiotic 100 mg twice a day  If excessive dryness or peeling occurs, reduce dose frequency or concentration of the topical scrubs.  If excessive stinging or burning occurs, remove the topical gel with mild soap and water and resume at a lower dose the next day.  Remember oral antibiotics and topical acne treatments may increase your sensitivity to the sun!  HOME CARE: Do not squeeze pimples because that can often lead to infections, worse acne, and scars. Use a moisturizer that contains retinoid or fruit acids that may inhibit the development of new acne lesions. Although there is not a clear link that foods can cause acne, doctors do believe that too many sweets predispose you to skin problems.  GET HELP RIGHT AWAY IF: If your acne gets worse or is not better within 10 days. If you become  depressed. If you become pregnant, discontinue medications and call your OB/GYN.  MAKE SURE YOU: Understand these instructions. Will watch your condition. Will get help right away if you are not doing well or get worse.   Your e-visit answers were reviewed by a board certified advanced clinical practitioner to complete your personal care plan.  Depending upon the condition, your plan could have included both over the counter or prescription medications.  Please review your pharmacy choice.  If there is a problem, you may contact your provider through [employer] and have the prescription routed to another pharmacy.  Your safety is important to us .  If you have drug allergies check your prescription carefully.  For the next 24 hours you can use MyChart to ask questions about today's visit, request a non-urgent call back, or ask for a work or school excuse from your e-visit provider.  You will get an email in the next two days asking about your experience. I hope that your e-visit has been valuable and will speed your recovery.  I have spent 5 minutes in review of e-visit questionnaire, review and updating patient chart, medical decision making and response to patient.   Jon Belt, PhD, FNP-BC

## 2024-02-24 ENCOUNTER — Encounter

## 2024-02-24 ENCOUNTER — Telehealth: Admitting: Physician Assistant

## 2024-02-24 DIAGNOSIS — A084 Viral intestinal infection, unspecified: Secondary | ICD-10-CM | POA: Diagnosis not present

## 2024-02-24 MED ORDER — ONDANSETRON 4 MG PO TBDP: 4.0000 mg | ORAL_TABLET | Freq: Three times a day (TID) | ORAL | 0 refills | Status: AC | PRN

## 2024-02-24 NOTE — Progress Notes (Signed)

## 2024-03-04 ENCOUNTER — Other Ambulatory Visit: Payer: Self-pay | Admitting: Emergency Medicine

## 2024-03-31 ENCOUNTER — Other Ambulatory Visit: Payer: Self-pay | Admitting: Neurosurgery

## 2024-03-31 DIAGNOSIS — R220 Localized swelling, mass and lump, head: Secondary | ICD-10-CM

## 2024-04-01 ENCOUNTER — Encounter: Payer: Self-pay | Admitting: Neurosurgery

## 2024-04-10 ENCOUNTER — Inpatient Hospital Stay: Admission: RE | Admit: 2024-04-10

## 2024-04-10 DIAGNOSIS — R220 Localized swelling, mass and lump, head: Secondary | ICD-10-CM
